# Patient Record
Sex: Male | Born: 1950
Health system: Southern US, Community
[De-identification: ages and names within clinical notes are randomized; demographics above are authoritative.]

## PROBLEM LIST (undated history)

## (undated) DIAGNOSIS — M329 Systemic lupus erythematosus, unspecified: Secondary | ICD-10-CM

## (undated) DIAGNOSIS — B351 Tinea unguium: Secondary | ICD-10-CM

## (undated) DIAGNOSIS — M7742 Metatarsalgia, left foot: Secondary | ICD-10-CM

## (undated) DIAGNOSIS — Z931 Gastrostomy status: Secondary | ICD-10-CM

## (undated) DIAGNOSIS — E785 Hyperlipidemia, unspecified: Secondary | ICD-10-CM

## (undated) DIAGNOSIS — R9431 Abnormal electrocardiogram [ECG] [EKG]: Secondary | ICD-10-CM

## (undated) DIAGNOSIS — M79606 Pain in leg, unspecified: Secondary | ICD-10-CM

## (undated) DIAGNOSIS — R7989 Other specified abnormal findings of blood chemistry: Secondary | ICD-10-CM

## (undated) DIAGNOSIS — M216X9 Other acquired deformities of unspecified foot: Secondary | ICD-10-CM

## (undated) DIAGNOSIS — I252 Old myocardial infarction: Secondary | ICD-10-CM

## (undated) DIAGNOSIS — M79605 Pain in left leg: Secondary | ICD-10-CM

## (undated) DIAGNOSIS — R319 Hematuria, unspecified: Secondary | ICD-10-CM

## (undated) DIAGNOSIS — R1312 Dysphagia, oropharyngeal phase: Secondary | ICD-10-CM

## (undated) DIAGNOSIS — I69319 Unspecified symptoms and signs involving cognitive functions following cerebral infarction: Secondary | ICD-10-CM

## (undated) DIAGNOSIS — R0789 Other chest pain: Secondary | ICD-10-CM

## (undated) DIAGNOSIS — E875 Hyperkalemia: Secondary | ICD-10-CM

## (undated) DIAGNOSIS — Z87448 Personal history of other diseases of urinary system: Secondary | ICD-10-CM

## (undated) DIAGNOSIS — M7741 Metatarsalgia, right foot: Secondary | ICD-10-CM

## (undated) DIAGNOSIS — R627 Adult failure to thrive: Secondary | ICD-10-CM

## (undated) DIAGNOSIS — F1010 Alcohol abuse, uncomplicated: Secondary | ICD-10-CM

## (undated) DIAGNOSIS — I509 Heart failure, unspecified: Secondary | ICD-10-CM

## (undated) DIAGNOSIS — I2510 Atherosclerotic heart disease of native coronary artery without angina pectoris: Secondary | ICD-10-CM

## (undated) DIAGNOSIS — IMO0002 Reserved for concepts with insufficient information to code with codable children: Secondary | ICD-10-CM

## (undated) DIAGNOSIS — M25579 Pain in unspecified ankle and joints of unspecified foot: Secondary | ICD-10-CM

## (undated) DIAGNOSIS — N390 Urinary tract infection, site not specified: Secondary | ICD-10-CM

## (undated) HISTORY — DX: Dysphagia, oropharyngeal phase: R13.12

## (undated) HISTORY — DX: Personal history of other diseases of urinary system: Z87.448

## (undated) HISTORY — DX: Pain in unspecified ankle and joints of unspecified foot: M25.579

## (undated) HISTORY — DX: Other specified abnormal findings of blood chemistry: R79.89

## (undated) HISTORY — DX: Other acquired deformities of unspecified foot: M21.6X9

## (undated) HISTORY — DX: Old myocardial infarction: I25.2

## (undated) HISTORY — DX: Metatarsalgia, right foot: M77.41

## (undated) HISTORY — DX: Adult failure to thrive: R62.7

## (undated) HISTORY — DX: Systemic lupus erythematosus, unspecified: M32.9

## (undated) HISTORY — DX: Pain in left leg: M79.605

## (undated) HISTORY — DX: Other disorders of phosphorus metabolism: E83.39

## (undated) HISTORY — DX: Alcohol abuse, uncomplicated: F10.10

## (undated) HISTORY — DX: Hyperkalemia: E87.5

## (undated) HISTORY — DX: Hyperlipidemia, unspecified: E78.5

## (undated) HISTORY — DX: Gastrostomy status: Z93.1

## (undated) HISTORY — DX: Metatarsalgia, left foot: M77.42

## (undated) HISTORY — DX: Unspecified symptoms and signs involving cognitive functions following cerebral infarction: I69.319

## (undated) HISTORY — DX: Abnormal electrocardiogram (ECG) (EKG): R94.31

## (undated) HISTORY — DX: Other chest pain: R07.89

## (undated) HISTORY — DX: Heart failure, unspecified: I50.9

## (undated) HISTORY — DX: Reserved for concepts with insufficient information to code with codable children: IMO0002

## (undated) HISTORY — DX: Hematuria, unspecified: R31.9

## (undated) HISTORY — DX: Tinea unguium: B35.1

## (undated) HISTORY — DX: Pain in leg, unspecified: M79.606

## (undated) HISTORY — DX: Urinary tract infection, site not specified: N39.0

---

## 2003-07-22 ENCOUNTER — Encounter: Admission: RE | Admit: 2003-07-22 | Discharge: 2003-07-22 | Payer: Self-pay | Admitting: Family Medicine

## 2003-07-22 ENCOUNTER — Ambulatory Visit (HOSPITAL_COMMUNITY): Admission: RE | Admit: 2003-07-22 | Discharge: 2003-07-22 | Payer: Self-pay | Admitting: Family Medicine

## 2006-12-18 DIAGNOSIS — M329 Systemic lupus erythematosus, unspecified: Secondary | ICD-10-CM

## 2006-12-18 DIAGNOSIS — I10 Essential (primary) hypertension: Secondary | ICD-10-CM

## 2006-12-18 HISTORY — DX: Systemic lupus erythematosus, unspecified: M32.9

## 2007-06-24 ENCOUNTER — Encounter: Payer: Self-pay | Admitting: Family Medicine

## 2007-06-24 ENCOUNTER — Inpatient Hospital Stay (HOSPITAL_COMMUNITY): Admission: EM | Admit: 2007-06-24 | Discharge: 2007-06-27 | Payer: Self-pay | Admitting: Emergency Medicine

## 2007-06-24 ENCOUNTER — Ambulatory Visit: Payer: Self-pay | Admitting: Cardiovascular Disease

## 2007-06-24 ENCOUNTER — Ambulatory Visit: Payer: Self-pay | Admitting: Family Medicine

## 2007-06-25 ENCOUNTER — Encounter: Payer: Self-pay | Admitting: Family Medicine

## 2007-06-26 ENCOUNTER — Ambulatory Visit: Payer: Self-pay | Admitting: Vascular Surgery

## 2007-08-06 ENCOUNTER — Encounter (INDEPENDENT_AMBULATORY_CARE_PROVIDER_SITE_OTHER): Payer: Self-pay | Admitting: *Deleted

## 2007-08-06 ENCOUNTER — Ambulatory Visit: Payer: Self-pay | Admitting: Family Medicine

## 2007-08-12 ENCOUNTER — Ambulatory Visit: Payer: Self-pay | Admitting: Cardiovascular Disease

## 2007-08-12 LAB — CONVERTED CEMR LAB
BUN: 19 mg/dL (ref 6–23)
CO2: 25 meq/L (ref 19–32)
Calcium: 9 mg/dL (ref 8.4–10.5)
Chloride: 108 meq/L (ref 96–112)
Creatinine, Ser: 0.8 mg/dL (ref 0.4–1.5)
GFR calc Af Amer: 129 mL/min
Glucose, Bld: 99 mg/dL (ref 70–99)

## 2007-09-02 ENCOUNTER — Ambulatory Visit: Payer: Self-pay | Admitting: Family Medicine

## 2007-10-22 DIAGNOSIS — I2510 Atherosclerotic heart disease of native coronary artery without angina pectoris: Secondary | ICD-10-CM

## 2007-10-22 HISTORY — DX: Atherosclerotic heart disease of native coronary artery without angina pectoris: I25.10

## 2007-11-04 ENCOUNTER — Ambulatory Visit: Payer: Self-pay | Admitting: Family Medicine

## 2007-11-04 ENCOUNTER — Ambulatory Visit: Payer: Self-pay | Admitting: Cardiovascular Disease

## 2007-11-04 ENCOUNTER — Encounter (INDEPENDENT_AMBULATORY_CARE_PROVIDER_SITE_OTHER): Payer: Self-pay | Admitting: Family Medicine

## 2007-11-11 ENCOUNTER — Encounter (INDEPENDENT_AMBULATORY_CARE_PROVIDER_SITE_OTHER): Payer: Self-pay | Admitting: Family Medicine

## 2007-11-11 LAB — CONVERTED CEMR LAB
Cholesterol: 190 mg/dL (ref 0–200)
HDL: 52 mg/dL (ref >39)
LDL Cholesterol: 120 mg/dL — ABNORMAL HIGH (ref 0–99)
Total CHOL/HDL Ratio: 3.7
Triglycerides: 88 mg/dL (ref <150)
VLDL: 18 mg/dL (ref 0–40)

## 2009-01-09 ENCOUNTER — Encounter: Payer: Self-pay | Admitting: Cardiovascular Disease

## 2009-01-09 ENCOUNTER — Ambulatory Visit: Payer: Self-pay | Admitting: Cardiovascular Disease

## 2009-01-09 DIAGNOSIS — F1010 Alcohol abuse, uncomplicated: Secondary | ICD-10-CM

## 2009-01-09 DIAGNOSIS — I420 Dilated cardiomyopathy: Secondary | ICD-10-CM | POA: Insufficient documentation

## 2009-01-09 HISTORY — DX: Alcohol abuse, uncomplicated: F10.10

## 2009-01-09 LAB — CONVERTED CEMR LAB
BUN: 15 mg/dL (ref 6–23)
Creatinine, Ser: 1.2 mg/dL (ref 0.4–1.5)
GFR calc non Af Amer: 79.93 mL/min (ref >60)
Glucose, Bld: 141 mg/dL — ABNORMAL HIGH (ref 70–99)
Potassium: 4 meq/L (ref 3.5–5.1)

## 2009-01-17 ENCOUNTER — Ambulatory Visit: Payer: Self-pay | Admitting: Family Medicine

## 2009-01-17 DIAGNOSIS — E669 Obesity, unspecified: Secondary | ICD-10-CM

## 2009-01-30 ENCOUNTER — Ambulatory Visit: Payer: Self-pay | Admitting: Family Medicine

## 2009-01-30 ENCOUNTER — Encounter: Payer: Self-pay | Admitting: Family Medicine

## 2009-01-30 LAB — CONVERTED CEMR LAB
HDL: 43 mg/dL (ref >39)
LDL Cholesterol: 162 mg/dL — ABNORMAL HIGH (ref 0–99)

## 2009-02-02 ENCOUNTER — Encounter: Payer: Self-pay | Admitting: Family Medicine

## 2009-03-02 DIAGNOSIS — I2510 Atherosclerotic heart disease of native coronary artery without angina pectoris: Secondary | ICD-10-CM

## 2009-03-02 DIAGNOSIS — E785 Hyperlipidemia, unspecified: Secondary | ICD-10-CM

## 2009-03-02 DIAGNOSIS — E1169 Type 2 diabetes mellitus with other specified complication: Secondary | ICD-10-CM | POA: Insufficient documentation

## 2009-03-16 ENCOUNTER — Ambulatory Visit: Payer: Self-pay | Admitting: Family Medicine

## 2009-04-10 ENCOUNTER — Ambulatory Visit: Payer: Self-pay | Admitting: Cardiovascular Disease

## 2009-04-17 ENCOUNTER — Telehealth: Payer: Self-pay | Admitting: *Deleted

## 2009-04-17 ENCOUNTER — Telehealth: Payer: Self-pay | Admitting: Cardiovascular Disease

## 2009-04-17 ENCOUNTER — Encounter: Payer: Self-pay | Admitting: Cardiovascular Disease

## 2009-04-17 DIAGNOSIS — H409 Unspecified glaucoma: Secondary | ICD-10-CM

## 2009-04-25 ENCOUNTER — Ambulatory Visit: Payer: Self-pay | Admitting: Family Medicine

## 2009-05-18 ENCOUNTER — Encounter: Payer: Self-pay | Admitting: Family Medicine

## 2009-06-05 ENCOUNTER — Ambulatory Visit: Payer: Self-pay | Admitting: Internal Medicine

## 2009-06-06 ENCOUNTER — Encounter: Payer: Self-pay | Admitting: *Deleted

## 2009-06-07 LAB — CONVERTED CEMR LAB
CO2: 26 meq/L (ref 19–32)
Calcium: 9.2 mg/dL (ref 8.4–10.5)
Creatinine, Ser: 1.3 mg/dL (ref 0.4–1.5)
GFR calc non Af Amer: 72.78 mL/min (ref >60)
Glucose, Bld: 200 mg/dL — ABNORMAL HIGH (ref 70–99)
Potassium: 4.9 meq/L (ref 3.5–5.1)
Sodium: 136 meq/L (ref 135–145)

## 2009-06-29 ENCOUNTER — Ambulatory Visit: Payer: Self-pay | Admitting: Family Medicine

## 2009-06-29 DIAGNOSIS — R1314 Dysphagia, pharyngoesophageal phase: Secondary | ICD-10-CM

## 2009-06-29 DIAGNOSIS — E1142 Type 2 diabetes mellitus with diabetic polyneuropathy: Secondary | ICD-10-CM

## 2009-06-29 LAB — CONVERTED CEMR LAB: Hgb A1c MFr Bld: 8.7 %

## 2009-07-11 ENCOUNTER — Ambulatory Visit: Payer: Self-pay | Admitting: Cardiovascular Disease

## 2009-07-18 ENCOUNTER — Encounter: Payer: Self-pay | Admitting: Family Medicine

## 2009-07-18 ENCOUNTER — Encounter: Admission: RE | Admit: 2009-07-18 | Discharge: 2009-07-18 | Payer: Self-pay | Admitting: Family Medicine

## 2009-07-26 ENCOUNTER — Encounter: Payer: Self-pay | Admitting: Family Medicine

## 2009-07-28 ENCOUNTER — Ambulatory Visit: Payer: Self-pay | Admitting: Family Medicine

## 2009-08-11 ENCOUNTER — Encounter: Payer: Self-pay | Admitting: Family Medicine

## 2009-08-15 ENCOUNTER — Ambulatory Visit: Payer: Self-pay | Admitting: Family Medicine

## 2009-08-28 ENCOUNTER — Ambulatory Visit: Payer: Self-pay | Admitting: Family Medicine

## 2009-09-19 ENCOUNTER — Ambulatory Visit: Payer: Self-pay | Admitting: Family Medicine

## 2009-10-19 ENCOUNTER — Encounter: Payer: Self-pay | Admitting: Family Medicine

## 2009-10-19 ENCOUNTER — Encounter: Admission: RE | Admit: 2009-10-19 | Discharge: 2010-01-17 | Payer: Self-pay | Admitting: Family Medicine

## 2009-11-09 ENCOUNTER — Telehealth: Payer: Self-pay | Admitting: *Deleted

## 2009-11-13 ENCOUNTER — Encounter (INDEPENDENT_AMBULATORY_CARE_PROVIDER_SITE_OTHER): Payer: Self-pay | Admitting: *Deleted

## 2009-11-15 ENCOUNTER — Encounter (INDEPENDENT_AMBULATORY_CARE_PROVIDER_SITE_OTHER): Payer: Self-pay | Admitting: *Deleted

## 2009-12-27 ENCOUNTER — Ambulatory Visit: Payer: Self-pay | Admitting: Cardiovascular Disease

## 2009-12-27 ENCOUNTER — Ambulatory Visit: Payer: Self-pay

## 2009-12-27 ENCOUNTER — Encounter: Payer: Self-pay | Admitting: Cardiovascular Disease

## 2009-12-27 ENCOUNTER — Ambulatory Visit (HOSPITAL_COMMUNITY): Admission: RE | Admit: 2009-12-27 | Discharge: 2009-12-27 | Payer: Self-pay | Admitting: Cardiovascular Disease

## 2009-12-29 ENCOUNTER — Ambulatory Visit: Payer: Self-pay | Admitting: Family Medicine

## 2009-12-29 LAB — CONVERTED CEMR LAB: Hgb A1c MFr Bld: 6.6 %

## 2010-01-01 ENCOUNTER — Encounter: Payer: Self-pay | Admitting: Family Medicine

## 2010-01-30 ENCOUNTER — Encounter: Payer: Self-pay | Admitting: Family Medicine

## 2010-01-31 ENCOUNTER — Encounter: Admission: RE | Admit: 2010-01-31 | Discharge: 2010-01-31 | Payer: Self-pay | Admitting: Family Medicine

## 2010-02-12 ENCOUNTER — Ambulatory Visit: Payer: Self-pay | Admitting: Family Medicine

## 2010-02-13 ENCOUNTER — Encounter: Payer: Self-pay | Admitting: Family Medicine

## 2010-02-13 ENCOUNTER — Ambulatory Visit: Payer: Self-pay | Admitting: Family Medicine

## 2010-02-13 LAB — CONVERTED CEMR LAB
AST: 14 units/L (ref 0–37)
Albumin: 3.9 g/dL (ref 3.5–5.2)
Alkaline Phosphatase: 86 units/L (ref 39–117)
Calcium: 8.8 mg/dL (ref 8.4–10.5)
Chloride: 106 meq/L (ref 96–112)
Creatinine, Ser: 1.19 mg/dL (ref 0.40–1.50)
HDL: 41 mg/dL (ref >39)
Sodium: 138 meq/L (ref 135–145)
Total Protein: 6.7 g/dL (ref 6.0–8.3)
Triglycerides: 107 mg/dL (ref <150)

## 2010-02-15 ENCOUNTER — Encounter: Payer: Self-pay | Admitting: Family Medicine

## 2010-05-03 ENCOUNTER — Encounter: Admission: RE | Admit: 2010-05-03 | Discharge: 2010-05-03 | Payer: Self-pay | Admitting: Family Medicine

## 2010-05-10 ENCOUNTER — Telehealth: Payer: Self-pay | Admitting: Family Medicine

## 2010-06-26 ENCOUNTER — Ambulatory Visit: Payer: Self-pay | Admitting: Cardiovascular Disease

## 2010-06-26 DIAGNOSIS — M109 Gout, unspecified: Secondary | ICD-10-CM

## 2010-06-28 LAB — CONVERTED CEMR LAB
Bilirubin, Direct: 0.2 mg/dL (ref 0.0–0.3)
Calcium: 8.8 mg/dL (ref 8.4–10.5)
Creatinine, Ser: 0.8 mg/dL (ref 0.4–1.5)
GFR calc non Af Amer: 123.41 mL/min (ref >60)
Total Bilirubin: 0.6 mg/dL (ref 0.3–1.2)
Total Protein: 6.5 g/dL (ref 6.0–8.3)

## 2010-07-18 ENCOUNTER — Encounter: Payer: Self-pay | Admitting: Family Medicine

## 2010-07-18 ENCOUNTER — Ambulatory Visit: Payer: Self-pay | Admitting: Family Medicine

## 2010-08-24 ENCOUNTER — Encounter: Payer: Self-pay | Admitting: Family Medicine

## 2010-09-28 ENCOUNTER — Encounter: Payer: Self-pay | Admitting: Family Medicine

## 2010-11-01 ENCOUNTER — Ambulatory Visit: Admission: RE | Admit: 2010-11-01 | Discharge: 2010-11-01 | Payer: Self-pay | Source: Home / Self Care

## 2010-11-01 DIAGNOSIS — R439 Unspecified disturbances of smell and taste: Secondary | ICD-10-CM | POA: Insufficient documentation

## 2010-11-01 LAB — CONVERTED CEMR LAB: Hgb A1c MFr Bld: 6.3 %

## 2010-11-07 ENCOUNTER — Encounter
Admission: RE | Admit: 2010-11-07 | Discharge: 2010-11-20 | Payer: Self-pay | Source: Home / Self Care | Attending: Family Medicine | Admitting: Family Medicine

## 2010-11-15 ENCOUNTER — Encounter: Payer: Self-pay | Admitting: Family Medicine

## 2010-11-15 DIAGNOSIS — K921 Melena: Secondary | ICD-10-CM | POA: Insufficient documentation

## 2010-11-22 NOTE — Assessment & Plan Note (Signed)
Summary: F6M/DM   Primary Lamika Connolly:  Angelena Sole MD  CC:  check up.  History of Present Illness: Blake Wilson is seen today for F/U of nonischemic DCM with HTN and elevated lipids.  He is doing well with no SOB, palpitations, edema or SSCP.  His last echo in March which I reviewed showed  mild LVE with mild diffuse hypokinesis EF 50-55% and mild MR.  He has been compliant with his meds which has been an issue in the past.  He is not working but says he goes to a gym 2-3 times / week and "walks around" He has had recurrent bouts of gout in his feet.    Current Problems (verified): 1)  Knee Pain, Right  (ICD-719.46) 2)  Dysphagia Unspecified  (ICD-787.20) 3)  Headache  (ICD-784.0) 4)  Diabetes Mellitus  (ICD-250.00) 5)  Encounter For Long-term Use of Other Medications  (ICD-V58.69) 6)  Glaucoma  (ICD-365.9) 7)  Cardiomyopathy  (ICD-425.4) 8)  Hypertension, Benign Systemic  (ICD-401.1) 9)  Cad  (ICD-414.00) 10)  Hyperlipidemia  (ICD-272.4) 11)  Obesity  (ICD-278.00) 12)  Alcohol Abuse  (ICD-305.00) 13)  Leg Edema, Bilateral  (ICD-782.3) 14)  Sle  (ICD-710.0) 15)  Osteoarthritis, Knee, Left  (ICD-715.96) 16)  Osteoarthritis, Back  (ICD-715.98)  Current Medications (verified): 1)  Aspir-Low 81 Mg Tbec (Aspirin) .Marland Kitchen.. 1 Once Daily After Meal 2)  Klor-Con 10 10 Meq Cr-Tabs (Potassium Chloride) .Marland Kitchen.. 1 Tab By Mouth Once Daily 3)  Travatan Z 0.004 % Soln (Travoprost) .Marland Kitchen.. 1 Drop in Both Eyes Once Daily 4)  Carvedilol 25 Mg Tabs (Carvedilol) .... Take One Tablet By Mouth Twice A Day 5)  Lasix 40 Mg Tabs (Furosemide) .... Take 1 Tab By Mouth Twice Daily 6)  Blood Glucose Meter  Kit (Blood Glucose Monitoring Suppl) .... Use As Directed 7)  Lancets  Misc (Lancets) .... Use 1 A Day To Check Your Blood Sugar 8)  Easy Check Glucose Test  Strp (Glucose Blood) .... Use 1 A Day To Check Your Blood Sugar 9)  Ultram 50 Mg Tabs (Tramadol Hcl) .... Take 1 Tab Every 8 Hours As Needed For Pain 10)  Lipitor  80 Mg Tabs (Atorvastatin Calcium) .... One Daily At Bedtime 11)  Metformin Hcl 1000 Mg Tabs (Metformin Hcl) .... Take One Twice Daily With Meals 12)  Lisinopril 40 Mg Tabs (Lisinopril) .... Take One Tablet By Mouth Daily  Allergies (verified): No Known Drug Allergies  Past History:  Past Medical History: Last updated: 03/02/2009 Current Problems:  CARDIOMYOPATHY (ICD-425.4) HYPERTENSION, BENIGN SYSTEMIC (ICD-401.1) CAD (ICD-414.00) HYPERLIPIDEMIA (ICD-272.4) OBESITY (ICD-278.00) ALCOHOL ABUSE (ICD-305.00) SPECIAL SCREENING MALIGNANT NEOPLASM OF PROSTATE (ICD-V76.44) LEG EDEMA, BILATERAL (ICD-782.3) SLE (ICD-710.0) OSTEOARTHRITIS, KNEE, LEFT (ICD-715.96) OSTEOARTHRITIS, BACK (ICD-715.98) lupus  SLE on chronic steriods until 1999.  States had rash only EF of 20  %,LVH  non ischemic cardiomyopathy, sees Dr. Eden Emms at [facility]  lupus erythematosus  Past Surgical History: Last updated: 03/16/2009 cath 05/08 - 40% stenosis in multiple vessels, non-ischemic cardiomyopathy  Family History: Last updated: 07/08/2009  Father:-obese. had an MI age 84. No other heart  disease in the family.   Social History: Last updated: 03/02/2009 Single, no children, lives alone. Denies smoking.  One beer or shot a day at most.   No drugs.  Lives in [place]  Review of Systems       Denies fever, malais, weight loss, blurry vision, decreased visual acuity, cough, sputum, SOB, hemoptysis, pleuritic pain, palpitaitons, heartburn, abdominal pain, melena, lower extremity edema,  claudication, or rash.   Vital Signs:  Patient profile:   60 year old male Height:      72 inches Weight:      232 pounds BMI:     31.58 Pulse rate:   57 / minute Resp:     14 per minute BP sitting:   114 / 64  (left arm)  Vitals Entered By: Kem Parkinson (June 26, 2010 11:08 AM)  Physical Exam  General:  Affect appropriate Healthy:  appears stated age HEENT: normal Neck supple with no  adenopathy JVP normal no bruits no thyromegaly Lungs clear with no wheezing and good diaphragmatic motion Heart:  S1/S2 no murmur,rub, gallop or click PMI normal Abdomen: benighn, BS positve, no tenderness, no AAA no bruit.  No HSM or HJR Distal pulses intact with no bruits No edema Neuro non-focal Skin warm and dry    Impression & Recommendations:  Problem # 1:  CARDIOMYOPATHY (ICD-425.4) Stable functional class one.  Consdier F/U echo or MRI in a year His updated medication list for this problem includes:    Aspir-low 81 Mg Tbec (Aspirin) .Marland Kitchen... 1 once daily after meal    Carvedilol 25 Mg Tabs (Carvedilol) .Marland Kitchen... Take one tablet by mouth twice a day    Lasix 40 Mg Tabs (Furosemide) .Marland Kitchen... Take 1 tab by mouth twice daily    Lisinopril 40 Mg Tabs (Lisinopril) .Marland Kitchen... Take one tablet by mouth daily  Problem # 2:  HYPERTENSION, BENIGN SYSTEMIC (ICD-401.1)  Well controlled continue current meds His updated medication list for this problem includes:    Aspir-low 81 Mg Tbec (Aspirin) .Marland Kitchen... 1 once daily after meal    Carvedilol 25 Mg Tabs (Carvedilol) .Marland Kitchen... Take one tablet by mouth twice a day    Lasix 40 Mg Tabs (Furosemide) .Marland Kitchen... Take 1 tab by mouth twice daily    Lisinopril 40 Mg Tabs (Lisinopril) .Marland Kitchen... Take one tablet by mouth daily  His updated medication list for this problem includes:    Aspir-low 81 Mg Tbec (Aspirin) .Marland Kitchen... 1 once daily after meal    Carvedilol 25 Mg Tabs (Carvedilol) .Marland Kitchen... Take one tablet by mouth twice a day    Lasix 40 Mg Tabs (Furosemide) .Marland Kitchen... Take 1 tab by mouth twice daily    Lisinopril 40 Mg Tabs (Lisinopril) .Marland Kitchen... Take one tablet by mouth daily  Problem # 3:  HYPERLIPIDEMIA (ICD-272.4) Stable labs per primary.  No myalgias His updated medication list for this problem includes:    Lipitor 80 Mg Tabs (Atorvastatin calcium) ..... One daily at bedtime  Problem # 4:  GOUT, UNSPECIFIED (ICD-274.9)  Needs diuretic for DCM.  Check uric acid level and start  Allopurinol 100mg /day.  Uptitrate per primary MD.    His updated medication list for this problem includes:    Allopurinol 100 Mg Tabs (Allopurinol) .Marland Kitchen... Please take 1 tablet by mouth daily.  Other Orders: TLB-BMP (Basic Metabolic Panel-BMET) (80048-METABOL) TLB-Hepatic/Liver Function Pnl (80076-HEPATIC) T-Uric Acid (Blood) [phone])  Patient Instructions: 1)  Your physician recommends that you schedule a follow-up appointment in: 6 months 2)  Your physician has recommended you make the following change in your medication:  3)  START ALLOPURINOL 100 MG by mouth DAILY. Prescriptions: ALLOPURINOL 100 MG TABS (ALLOPURINOL) Please take 1 tablet by mouth daily.  #30 x 6   Entered by:   Whitney Maeola Sarah RN   Authorized by:   Colon Branch, MD, [facility]   Signed by:   Ellender Hose RN on 06/26/2010  Method used:   Electronically to        [facility] (retail)       [address]       [place], [place]  [address]       Ph: [phone] or [phone]       Fax: [phone]   RxID:   [id number]   Prevention & Chronic Care Immunizations   Influenza vaccine: Fluvax Non-MCR  (08/15/2009)   Influenza vaccine due: 09/01/2008    Tetanus booster: 01/17/2009: Tdap    Pneumococcal vaccine: Not documented  Colorectal Screening   Hemoccult: Not documented    Colonoscopy: Not documented   Colonoscopy action/deferral: GI Referral  (12/29/2009)  Other Screening   PSA: 0.41  (11/04/2007)   PSA due due: 11/03/2008   Smoking status: quit > 6 months  (12/29/2009)  Diabetes Mellitus   HgbA1C: 6.6  (12/29/2009)    Eye exam: Vision 20/20 B No diabetic retinopathy  (07/20/2009)    Foot exam: Not documented   Foot exam action/deferral: Do today   High risk foot: Not documented   Foot care education: Not documented    Urine microalbumin/creatinine ratio: Not documented  Lipids   Total Cholesterol: 140  (02/13/2010)    LDL: 78  (02/13/2010)   LDL Direct: Not documented   HDL: 41  (02/13/2010)   Triglycerides: 107  (02/13/2010)    SGOT (AST): 14  (02/13/2010)   BMP action: Ordered   SGPT (ALT): 21  (02/13/2010)   Alkaline phosphatase: 86  (02/13/2010)   Total bilirubin: 0.5  (02/13/2010)  Hypertension   Last Blood Pressure: 114 / 64  (06/26/2010)   Serum creatinine: 1.19  (02/13/2010)   Serum potassium 4.8  (02/13/2010)  Self-Management Support :   Personal Goals (by the next clinic visit) :     Personal A1C goal: 8  (09/19/2009)     Personal blood pressure goal: 130/80  (07/28/2009)     Personal LDL goal: 100  (07/28/2009)    Diabetes self-management support: CBG self-monitoring log, Written self-care plan, Education handout, Pre-printed educational material  (08/15/2009)    Hypertension self-management support: Not documented    Hypertension self-management support not done because: Good outcomes  (08/15/2009)    Lipid self-management support: Not documented

## 2010-11-22 NOTE — Consult Note (Signed)
Summary: Nutrition & DM  Nutrition & DM   Imported By: De Nurse 10/31/2009 10:53:26  _____________________________________________________________________  External Attachment:    Type:   Image     Comment:   External Document

## 2010-11-22 NOTE — Letter (Signed)
Summary: Generic Letter  [facility]  [address]   [place], [place] [address]   Phone: [phone]  Fax: [phone]    02/15/2010  Blake Wilson [address] [place], [place]  [address]  Dear Mr. Tennell,  Here is a copy of your lab results.  You cholesterol numbers look great so we will not make any medication changes.  Your kidney and liver function also look good.   Tests: (1) Basic Metabolic Panel ([address]846)   Order Note: FASTING   Sodium                    138 mEq/L                   135-145   Potassium                 4.8 mEq/L                   3.5-5.3   Chloride                  106 mEq/L                   96-112   CO2                       23 mEq/L                    19-32   Glucose              [H]  104 mg/dL                   96-29   BUN                       23 mg/dL                    5-28   Creatinine                1.19 mg/dL                  0.40-1.50   Calcium                   8.8 mg/dL                   4.1-32.4  Tests: (2) Lipid Profile (40102)   Cholesterol               140 mg/dL                   7-253     ATP III Classification:           < 200        mg/dL        Desirable          200 - 239     mg/dL        Borderline High          >= 240        mg/dL        High         Triglyceride              107 mg/dL                   <  150   HDL Cholesterol           41 mg/dL                    >40   Total Chol/HDL Ratio      3.4 Ratio  VLDL Cholesterol (Calc)                             21 mg/dL                    9-81  LDL Cholesterol (Calc)                             78 mg/dL                    1-91           Total Cholesterol/HDL Ratio:CHD Risk                            Coronary Heart Disease Risk Table                                            Men       Women              1/2 Average Risk              3.4        3.3                  Average Risk              5.0        4.4              2 X Average Risk               9.6        7.1              3 X Average Risk             23.4       11.0     Use the calculated Patient Ratio above and the CHD Risk table      to determine the patient's CHD Risk.     ATP III Classification (LDL):           < 100        mg/dL         Optimal          100 - 129     mg/dL         Near or Above Optimal          130 - 159     mg/dL         Borderline High          160 - 189     mg/dL         High           > 190        mg/dL         Very High        Tests: (3) Liver Profile (47829)   Bilirubin,  Total          0.5 mg/dL                   1.6-1.0   Bilirubin, Direct         0.1 mg/dL                   9.6-0.4   Indirect Bilirubin        0.4 mg/dL                   5.4-0.9   Alkaline Phosphatase      86 U/L                      39-117   AST/SGOT                  14 U/L                      0-37   ALT/SGPT                  21 U/L                      0-53   Total Protein             6.7 g/dL                    8.1-1.9   Albumin                   3.9 g/dL                    1.4-7.8        Sincerely,   Angelena Sole MD  Appended Document: Generic Letter mailed.

## 2010-11-22 NOTE — Miscellaneous (Signed)
  Clinical Lists Changes  Problems: Removed problem of GOUT, UNSPECIFIED (ICD-274.9) Removed problem of KNEE PAIN, RIGHT (ICD-719.46) Removed problem of HEADACHE (ICD-784.0) Removed problem of ENCOUNTER FOR LONG-TERM USE OF OTHER MEDICATIONS (ICD-V58.69) Removed problem of LEG EDEMA, BILATERAL (ICD-782.3) Removed problem of OSTEOARTHRITIS, KNEE, LEFT (ICD-715.96) Removed problem of OSTEOARTHRITIS, BACK (ICD-715.98)

## 2010-11-22 NOTE — Miscellaneous (Signed)
  Clinical Lists Changes pt receives meds through the [facility] and can get accupril free. lisinopril changed to accupril. Deliah Goody, RN  November 13, 2009 8:40 AM  Medications: Changed medication from LISINOPRIL 40 MG TABS (LISINOPRIL) 1 by mouth once daily to ACCUPRIL 40 MG TABS (QUINAPRIL HCL) on e tablet by mouth once daily

## 2010-11-22 NOTE — Progress Notes (Signed)
Summary: Dental Clinic  Phone Note From Other Clinic   Caller: Angie Call For: Adult Dental Summary of Call: Pt is Diabetic and needs all his top teeth extracted, appt set for next month.  Needs to know if MD feels pts Diabetes are under control enough for this.  Also he is on asprin and wants to know if it is ok for him to discontinue this.  Will forward message to MD Initial call taken by: Jone Baseman CMA,  November 09, 2009 11:55 AM  Follow-up for Phone Call        Please inform pt that I feel that his diabetes is under good enough control to have the dental procedure performed.  He should stop taking the Aspirin for 3 days prior to the procedure and then restart taking it 2 days afterward. Follow-up by: Angelena Sole MD,  November 10, 2009 10:07 AM  Additional Follow-up for Phone Call Additional follow up Details #1::        Informed Angie of the above.  She ask that we fax over this message so she can have documentation.  Will fax to [phone] Additional Follow-up by: Jone Baseman CMA,  November 10, 2009 10:19 AM

## 2010-11-22 NOTE — Progress Notes (Signed)
Summary: Rx Req  Phone Note Refill Request Call back at Work Phone [phone] Message from:  Patient  Refills Requested: Medication #1:  LASIX 40 MG TABS take 1 tab by mouth twice daily [facility] DEPT.  Initial call taken by: Clydell Hakim,  May 10, 2010 9:20 AM    Prescriptions: LASIX 40 MG TABS (FUROSEMIDE) take 1 tab by mouth twice daily  #60 x 12   Entered and Authorized by:   Angelena Sole MD   Signed by:   Angelena Sole MD on 05/10/2010   Method used:   Faxed to ...       [facility] (retail)       [address] [place], [place]  [address]       Ph: [phone]       Fax: [phone]   RxID:   [id number]

## 2010-11-22 NOTE — Miscellaneous (Addendum)
  Clinical Lists Changes Results from Colon Cancer screening study 1 Hemoccult Card positive 2 Hemoccult Cards negative  Will refer to GI for colonoscopy Problems: Added new problem of HEMOCCULT POSITIVE STOOL (ICD-578.1) - Signed Orders: Added new Referral order of Gastroenterology Referral (GI) - Signed  Appended Document: hemoccult cards [facility] Study)    Lab Visit  Laboratory Results  Date/Time Received: November 15, 2010 Date/Time Reported: November 22, 2010 5:22 PM   Stool - Occult Blood Hemmoccult #1: negative Date: 11/03/2010 Hemoccult #2: negative Date: 11/04/2010 Hemoccult #3: positive Date: 12/06/2010 Comments: [facility] Study, cards developed at [facility] ...............test performed by......Marland KitchenBonnie A. [place], MLS (ASCP)cm   Orders Today:  Pt referred to GI Angelena Sole MD  November 23, 2010 2:00 PM

## 2010-11-22 NOTE — Assessment & Plan Note (Signed)
Summary: DM CHECK   Vital Signs:  Patient profile:   60 year old male Height:      72 inches Weight:      240.13 pounds BMI:     32.69 Temp:     97.9 degrees F oral Pulse rate:   59 / minute BP sitting:   140 / 64  (right arm)  Vitals Entered By: Terese Door (December 29, 2009 9:30 AM) CC: DM, HTN, HLD Is Patient Diabetic? Yes Pain Assessment Patient in pain? no        Primary Care Provider:  Angelena Sole MD  CC:  DM, HTN, and HLD.  History of Present Illness: 1. DM:  Pt is taking his Metformin as prescribed.  He is trying to watch his diet.  He checks his blood sugars at home regularly, range between 70-220.        ROS: denies any numbness / weakness, vision problems, skin ulcers  2. HTN: Pt is taking his medicines as prescribed.  He does not check his blood pressure at home regularly.      ROS: denies chest pain, shortness of breath, headache  3. HLD: Pt is taking his Lipitor as prescribed.  His cholesterol was last checked in 01/2009.      ROS: denies any claudication  4. Non-ischemic cardiomyopathy: recent appointment with Dr. Eden Emms shows that his heart is getting a little bit better.  EF  ~50%.        ROS: denies lower extremity swelling.  Habits & Providers  Alcohol-Tobacco-Diet     Tobacco Status: quit > 6 months  Current Medications (verified): 1)  Aspir-Low 81 Mg Tbec (Aspirin) .Marland Kitchen.. 1 Once Daily After Meal 2)  Klor-Con 10 10 Meq Cr-Tabs (Potassium Chloride) .Marland Kitchen.. 1 Tab By Mouth Once Daily 3)  Travatan Z 0.004 % Soln (Travoprost) .Marland Kitchen.. 1 Drop in Both Eyes Once Daily 4)  Carvedilol 25 Mg Tabs (Carvedilol) .... Take One Tablet By Mouth Twice A Day 5)  Lasix 40 Mg Tabs (Furosemide) .... Take 1 Tab By Mouth Twice Daily 6)  Blood Glucose Meter  Kit (Blood Glucose Monitoring Suppl) .... Use As Directed 7)  Lancets  Misc (Lancets) .... Use 1 A Day To Check Your Blood Sugar 8)  Easy Check Glucose Test  Strp (Glucose Blood) .... Use 1 A Day To Check Your Blood  Sugar 9)  Ultram 50 Mg Tabs (Tramadol Hcl) .... Take 1 Tab Every 8 Hours As Needed For Pain 10)  Lipitor 80 Mg Tabs (Atorvastatin Calcium) .... One Daily At Bedtime 11)  Metformin Hcl 1000 Mg Tabs (Metformin Hcl) .... Take One Twice Daily With Meals 12)  Lisinopril 40 Mg Tabs (Lisinopril) .... Take One Tablet By Mouth Daily  Allergies: No Known Drug Allergies  Past History:  Past Medical History: Reviewed history from 03/02/2009 and no changes required. Current Problems:  CARDIOMYOPATHY (ICD-425.4) HYPERTENSION, BENIGN SYSTEMIC (ICD-401.1) CAD (ICD-414.00) HYPERLIPIDEMIA (ICD-272.4) OBESITY (ICD-278.00) ALCOHOL ABUSE (ICD-305.00) SPECIAL SCREENING MALIGNANT NEOPLASM OF PROSTATE (ICD-V76.44) LEG EDEMA, BILATERAL (ICD-782.3) SLE (ICD-710.0) OSTEOARTHRITIS, KNEE, LEFT (ICD-715.96) OSTEOARTHRITIS, BACK (ICD-715.98) lupus  SLE on chronic steriods until 1999.  States had rash only EF of 20  %,LVH  non ischemic cardiomyopathy, sees Dr. Eden Emms at [facility]  lupus erythematosus  Social History: Reviewed history from 03/02/2009 and no changes required. Single, no children, lives alone. Denies smoking.  One beer or shot a day at most.   No drugs.  Lives in [place] Status:  quit > 6 months  Physical Exam  General:  Vitals reviewed.  alert, well-hydrated, and overweight-appearing.   Head:  normocephalic and atraumatic.   Eyes:  vision grossly intact, pupils equal, pupils round, and pupils reactive to light.   Mouth:  fair dentition.   Neck:  no JVD Lungs:  normal respiratory effort, no intercostal retractions, and no wheezes.   Heart:  normal rate and regular rhythm.   Abdomen:  soft and non-tender.   Extremities:  no lower extremity edema Skin:  Intact without suspicious lesions or rashes Psych:  alert and cooperative, not depressed appearing   Impression & Recommendations:  Problem # 1:  DIABETES MELLITUS (ICD-250.00) Assessment Unchanged At goal. No  changes His updated medication list for this problem includes:    Aspir-low 81 Mg Tbec (Aspirin) .Marland Kitchen... 1 once daily after meal    Metformin Hcl 1000 Mg Tabs (Metformin hcl) .Marland Kitchen... Take one twice daily with meals    Lisinopril 40 Mg Tabs (Lisinopril) .Marland Kitchen... Take one tablet by mouth daily  Orders: A1C-[facility] (04540) [facility]- Est  Level 4 (98119)  Problem # 2:  HYPERTENSION, BENIGN SYSTEMIC (ICD-401.1) Assessment: Unchanged  Slightly elevated.  No need for medication changes at this point. His updated medication list for this problem includes:    Carvedilol 25 Mg Tabs (Carvedilol) .Marland Kitchen... Take one tablet by mouth twice a day    Lasix 40 Mg Tabs (Furosemide) .Marland Kitchen... Take 1 tab by mouth twice daily    Lisinopril 40 Mg Tabs (Lisinopril) .Marland Kitchen... Take one tablet by mouth daily  Orders: [facility]- Est  Level 4 (14782)  Problem # 3:  HYPERLIPIDEMIA (ICD-272.4) Assessment: Unchanged  Will recheck in 2 months. His updated medication list for this problem includes:    Lipitor 80 Mg Tabs (Atorvastatin calcium) ..... One daily at bedtime  Orders: [facility]- Est  Level 4 (95621)  Problem # 4:  CARDIOMYOPATHY (ICD-425.4) Assessment: Unchanged  Doing well, followed by Dr. Eden Emms.  Orders: [facility]- Est  Level 4 (99214)  Complete Medication List: 1)  Aspir-low 81 Mg Tbec (Aspirin) .Marland Kitchen.. 1 once daily after meal 2)  Klor-con 10 10 Meq Cr-tabs (Potassium chloride) .Marland Kitchen.. 1 tab by mouth once daily 3)  Travatan Z 0.004 % Soln (Travoprost) .Marland Kitchen.. 1 drop in both eyes once daily 4)  Carvedilol 25 Mg Tabs (Carvedilol) .... Take one tablet by mouth twice a day 5)  Lasix 40 Mg Tabs (Furosemide) .... Take 1 tab by mouth twice daily 6)  Blood Glucose Meter Kit (Blood glucose monitoring suppl) .... Use as directed 7)  Lancets Misc (Lancets) .... Use 1 a day to check your blood sugar 8)  Easy Check Glucose Test Strp (Glucose blood) .... Use 1 a day to check your blood sugar 9)  Ultram 50 Mg Tabs (Tramadol hcl) .... Take 1 tab every 8  hours as needed for pain 10)  Lipitor 80 Mg Tabs (Atorvastatin calcium) .... One daily at bedtime 11)  Metformin Hcl 1000 Mg Tabs (Metformin hcl) .... Take one twice daily with meals 12)  Lisinopril 40 Mg Tabs (Lisinopril) .... Take one tablet by mouth daily  Other Orders: Colonoscopy (Colon)  Patient Instructions: 1)  I think that you are doing great 2)  Your HgA1C sugar test was 6.6%, which is a good number 3)  Your blood pressure is fine 4)  I have put in a referral for a colonoscopy.  I think that [facility] Eagles should help cover it.   5)  I would like to see you back in 8  weeks to check your cholesterol.  Do not eat for 8 hours prior to your appointment.  Laboratory Results   Blood Tests   Date/Time Received: December 29, 2009 9:31 AM  Date/Time Reported: December 29, 2009 9:32 AM   HGBA1C: 6.6%   (Normal Range: Non-Diabetic - 3-6%   Control Diabetic - 6-8%)  Comments: ...............test performed by......Marland KitchenBonnie A. [place], MLS (ASCP)cm      Prevention & Chronic Care Immunizations   Influenza vaccine: Fluvax Non-MCR  (08/15/2009)   Influenza vaccine due: 09/01/2008    Tetanus booster: 01/17/2009: Tdap    Pneumococcal vaccine: Not documented  Colorectal Screening   Hemoccult: Not documented    Colonoscopy: Not documented   Colonoscopy action/deferral: GI Referral  (12/29/2009)  Other Screening   PSA: 0.41  (11/04/2007)   PSA due due: 11/03/2008   Smoking status: quit > 6 months  (12/29/2009)  Diabetes Mellitus   HgbA1C: 6.6  (12/29/2009)    Eye exam: Vision 20/20 B No diabetic retinopathy  (07/20/2009)    Foot exam: Not documented   Foot exam action/deferral: Do today   High risk foot: Not documented   Foot care education: Not documented    Urine microalbumin/creatinine ratio: Not documented    Diabetes flowsheet reviewed?: Yes   Progress toward A1C goal: Unchanged  Lipids   Total Cholesterol: 225  (01/30/2009)   LDL: 162  (01/30/2009)   LDL Direct:  Not documented   HDL: 43  (01/30/2009)   Triglycerides: 99  (01/30/2009)    SGOT (AST): Not documented   SGPT (ALT): Not documented   Alkaline phosphatase: Not documented   Total bilirubin: Not documented    Lipid flowsheet reviewed?: Yes   Progress toward LDL goal: Unchanged  Hypertension   Last Blood Pressure: 140 / 64  (12/29/2009)   Serum creatinine: 1.3  (06/05/2009)   Serum potassium 4.9  (06/05/2009)    Hypertension flowsheet reviewed?: Yes   Progress toward BP goal: Unchanged  Self-Management Support :   Personal Goals (by the next clinic visit) :     Personal A1C goal: 8  (09/19/2009)     Personal blood pressure goal: 130/80  (07/28/2009)     Personal LDL goal: 100  (07/28/2009)    Diabetes self-management support: CBG self-monitoring log, Written self-care plan, Education handout, Pre-printed educational material  (08/15/2009)    Hypertension self-management support: Not documented    Hypertension self-management support not done because: Good outcomes  (08/15/2009)    Lipid self-management support: Not documented    Nursing Instructions: Screening colonoscopy ordered

## 2010-11-22 NOTE — Assessment & Plan Note (Signed)
Summary: F6M/DM   Primary Provider:  Angelena Sole MD  CC:  no complaints.  History of Present Illness: [place]aus is seen today for F/U of nonischemic DCM with HTN and elevated lipids.  He is doing well with no SOB, palpitations, edema or SSCP.  He had an echo today which I reviewed.  He has mild LVE with mild diffuse hypokinesis EF 50-55% and mild MR.  He has been compliant with his meds which has been an issue in the past.  He is not working but says he goes to a gym 2-3 times / week and "walks around"  Current Problems (verified): 1)  Knee Pain, Right  (ICD-719.46) 2)  Dysphagia Unspecified  (ICD-787.20) 3)  Headache  (ICD-784.0) 4)  Diabetes Mellitus  (ICD-250.00) 5)  Encounter For Long-term Use of Other Medications  (ICD-V58.69) 6)  Glaucoma  (ICD-365.9) 7)  Cardiomyopathy  (ICD-425.4) 8)  Hypertension, Benign Systemic  (ICD-401.1) 9)  Cad  (ICD-414.00) 10)  Hyperlipidemia  (ICD-272.4) 11)  Obesity  (ICD-278.00) 12)  Alcohol Abuse  (ICD-305.00) 13)  Leg Edema, Bilateral  (ICD-782.3) 14)  Sle  (ICD-710.0) 15)  Osteoarthritis, Knee, Left  (ICD-715.96) 16)  Osteoarthritis, Back  (ICD-715.98)  Current Medications (verified): 1)  Aspir-Low 81 Mg Tbec (Aspirin) .Marland Kitchen.. 1 Once Daily After Meal 2)  Klor-Con 10 10 Meq Cr-Tabs (Potassium Chloride) .Marland Kitchen.. 1 Tab By Mouth Once Daily 3)  Travatan Z 0.004 % Soln (Travoprost) .Marland Kitchen.. 1 Drop in Both Eyes Once Daily 4)  Carvedilol 25 Mg Tabs (Carvedilol) .... Take One Tablet By Mouth Twice A Day 5)  Lasix 40 Mg Tabs (Furosemide) .... Take 1 Tab By Mouth Twice Daily 6)  Blood Glucose Meter  Kit (Blood Glucose Monitoring Suppl) .... Use As Directed 7)  Lancets  Misc (Lancets) .... Use 1 A Day To Check Your Blood Sugar 8)  Easy Check Glucose Test  Strp (Glucose Blood) .... Use 1 A Day To Check Your Blood Sugar 9)  Ultram 50 Mg Tabs (Tramadol Hcl) .... Take 1 Tab Every 8 Hours As Needed For Pain 10)  Lipitor 80 Mg Tabs (Atorvastatin Calcium) .... One  Daily At Bedtime 11)  Metformin Hcl 1000 Mg Tabs (Metformin Hcl) .... Take One Twice Daily With Meals 12)  Lisinopril 40 Mg Tabs (Lisinopril) .... Take One Tablet By Mouth Daily  Allergies (verified): No Known Drug Allergies  Past History:  Past Medical History: Last updated: 03/02/2009 Current Problems:  CARDIOMYOPATHY (ICD-425.4) HYPERTENSION, BENIGN SYSTEMIC (ICD-401.1) CAD (ICD-414.00) HYPERLIPIDEMIA (ICD-272.4) OBESITY (ICD-278.00) ALCOHOL ABUSE (ICD-305.00) SPECIAL SCREENING MALIGNANT NEOPLASM OF PROSTATE (ICD-V76.44) LEG EDEMA, BILATERAL (ICD-782.3) SLE (ICD-710.0) OSTEOARTHRITIS, KNEE, LEFT (ICD-715.96) OSTEOARTHRITIS, BACK (ICD-715.98) lupus  SLE on chronic steriods until 1999.  States had rash only EF of 20  %,LVH  non ischemic cardiomyopathy, sees Dr. Eden Emms at [facility]  lupus erythematosus  Past Surgical History: Last updated: 03/16/2009 cath 05/08 - 40% stenosis in multiple vessels, non-ischemic cardiomyopathy  Family History: Last updated: 07/08/2009  Father:-obese. had an MI age 25. No other heart  disease in the family.   Social History: Last updated: 03/02/2009 Single, no children, lives alone. Denies smoking.  One beer or shot a day at most.   No drugs.  Lives in [place]  Review of Systems       Denies fever, malais, weight loss, blurry vision, decreased visual acuity, cough, sputum, SOB, hemoptysis, pleuritic pain, palpitaitons, heartburn, abdominal pain, melena, lower extremity edema, claudication, or rash.   Vital Signs:  Patient profile:  60 year old male Height:      72 inches Weight:      241 pounds BMI:     32.80 Pulse rate:   61 / minute Resp:     14 per minute BP sitting:   120 / 66  (left arm)  Vitals Entered By: Kem Parkinson (December 27, 2009 10:49 AM)  Physical Exam  General:  Affect appropriate Healthy:  appears stated age HEENT: normal Neck supple with no adenopathy JVP normal no bruits no thyromegaly Lungs  clear with no wheezing and good diaphragmatic motion Heart:  S1/S2 no murmur,rub, gallop or click PMI normal Abdomen: benighn, BS positve, no tenderness, no AAA no bruit.  No HSM or HJR Distal pulses intact with no bruits No edema Neuro non-focal Skin warm and dry    Impression & Recommendations:  Problem # 1:  CARDIOMYOPATHY (ICD-425.4) EF stable by echo continue current meds The following medications were removed from the medication list:    Accupril 40 Mg Tabs (Quinapril hcl) ..... On e tablet by mouth once daily His updated medication list for this problem includes:    Aspir-low 81 Mg Tbec (Aspirin) .Marland Kitchen... 1 once daily after meal    Carvedilol 25 Mg Tabs (Carvedilol) .Marland Kitchen... Take one tablet by mouth twice a day    Lasix 40 Mg Tabs (Furosemide) .Marland Kitchen... Take 1 tab by mouth twice daily    Lisinopril 40 Mg Tabs (Lisinopril) .Marland Kitchen... Take one tablet by mouth daily  Problem # 2:  HYPERTENSION, BENIGN SYSTEMIC (ICD-401.1) Well controlled The following medications were removed from the medication list:    Accupril 40 Mg Tabs (Quinapril hcl) ..... On e tablet by mouth once daily His updated medication list for this problem includes:    Aspir-low 81 Mg Tbec (Aspirin) .Marland Kitchen... 1 once daily after meal    Carvedilol 25 Mg Tabs (Carvedilol) .Marland Kitchen... Take one tablet by mouth twice a day    Lasix 40 Mg Tabs (Furosemide) .Marland Kitchen... Take 1 tab by mouth twice daily    Lisinopril 40 Mg Tabs (Lisinopril) .Marland Kitchen... Take one tablet by mouth daily  Problem # 3:  HYPERLIPIDEMIA (ICD-272.4) Continue diet Rx target LDL less than 130.  F/U labs in 6 months His updated medication list for this problem includes:    Lipitor 80 Mg Tabs (Atorvastatin calcium) ..... One daily at bedtime  CHOL: 225 (01/30/2009)   LDL: 162 (01/30/2009)   HDL: 43 (01/30/2009)   TG: 99 (01/30/2009)  Patient Instructions: 1)  Your physician recommends that you schedule a follow-up appointment in: 6 MONTHS   EKG Report  Procedure date:   12/27/2009  Findings:      NSR 53 Poor R wave progression [facility] normal

## 2010-11-22 NOTE — Miscellaneous (Signed)
Summary: Re: GI referral for colonscopy.  Clinical Lists Changes   received notification from [facility] that they are unable to complete the referral for GI for colonscopy at this time due to lack of volunteer physicians in this speciality group . they will notify patient when they can process referral. Theresia Lo RN  January 01, 2010 1:35 PM

## 2010-11-22 NOTE — Assessment & Plan Note (Signed)
Summary: FU/KH   Vital Signs:  Patient profile:   60 year old male Weight:      235.1 pounds Temp:     98.1 degrees F oral Pulse rate:   61 / minute Pulse rhythm:   regular BP sitting:   117 / 73  (left arm) Cuff size:   large  Vitals Entered By: Loralee Pacas CMA (February 12, 2010 1:33 PM)  Primary Care Provider:  Angelena Sole MD   History of Present Illness: 1. HLD: Pt is taking and tolerating his Lipitor as prescribed.  Lipids last check 01/2009  2. HTN: Pt is taking and tolerating his medications as prescribed.  He doesn't check his blood pressure regularly.        ROS: denies chest pain, shortness of breath  3. DMII:  Pt is taking and tolerating his medications as prescribed.  He does check his blood sugars everyday.  14 day average  ~127,  30 day average  ~ 139.  He is going to Diabetes education classes.      ROS: denies numbness or claudication    Current Medications (verified): 1)  Aspir-Low 81 Mg Tbec (Aspirin) .Marland Kitchen.. 1 Once Daily After Meal 2)  Klor-Con 10 10 Meq Cr-Tabs (Potassium Chloride) .Marland Kitchen.. 1 Tab By Mouth Once Daily 3)  Travatan Z 0.004 % Soln (Travoprost) .Marland Kitchen.. 1 Drop in Both Eyes Once Daily 4)  Carvedilol 25 Mg Tabs (Carvedilol) .... Take One Tablet By Mouth Twice A Day 5)  Lasix 40 Mg Tabs (Furosemide) .... Take 1 Tab By Mouth Twice Daily 6)  Blood Glucose Meter  Kit (Blood Glucose Monitoring Suppl) .... Use As Directed 7)  Lancets  Misc (Lancets) .... Use 1 A Day To Check Your Blood Sugar 8)  Easy Check Glucose Test  Strp (Glucose Blood) .... Use 1 A Day To Check Your Blood Sugar 9)  Ultram 50 Mg Tabs (Tramadol Hcl) .... Take 1 Tab Every 8 Hours As Needed For Pain 10)  Lipitor 80 Mg Tabs (Atorvastatin Calcium) .... One Daily At Bedtime 11)  Metformin Hcl 1000 Mg Tabs (Metformin Hcl) .... Take One Twice Daily With Meals 12)  Lisinopril 40 Mg Tabs (Lisinopril) .... Take One Tablet By Mouth Daily  Allergies: No Known Drug Allergies  Past History:  Past  Medical History: Reviewed history from 03/02/2009 and no changes required. Current Problems:  CARDIOMYOPATHY (ICD-425.4) HYPERTENSION, BENIGN SYSTEMIC (ICD-401.1) CAD (ICD-414.00) HYPERLIPIDEMIA (ICD-272.4) OBESITY (ICD-278.00) ALCOHOL ABUSE (ICD-305.00) SPECIAL SCREENING MALIGNANT NEOPLASM OF PROSTATE (ICD-V76.44) LEG EDEMA, BILATERAL (ICD-782.3) SLE (ICD-710.0) OSTEOARTHRITIS, KNEE, LEFT (ICD-715.96) OSTEOARTHRITIS, BACK (ICD-715.98) lupus  SLE on chronic steriods until 1999.  States had rash only EF of 20  %,LVH  non ischemic cardiomyopathy, sees Dr. Eden Emms at [facility]  lupus erythematosus  Social History: Reviewed history from 03/02/2009 and no changes required. Single, no children, lives alone. Denies smoking.  One beer or shot a day at most.   No drugs.  Lives in [place]  Physical Exam  General:  Vitals reviewed.  alert, well-hydrated, and overweight-appearing.   Eyes:  vision grossly intact, pupils equal, pupils round, and pupils reactive to light.   Mouth:  fair dentition.   Neck:  no JVD Lungs:  normal respiratory effort, no intercostal retractions, and no wheezes.   Heart:  normal rate and regular rhythm.   Abdomen:  soft and non-tender.   Extremities:  no lower extremity edema Psych:  alert and cooperative, not depressed appearing   Impression & Recommendations:  Problem #  1:  DIABETES MELLITUS (ICD-250.00) Assessment Unchanged  At goal, continue current medications His updated medication list for this problem includes:    Aspir-low 81 Mg Tbec (Aspirin) .Marland Kitchen... 1 once daily after meal    Metformin Hcl 1000 Mg Tabs (Metformin hcl) .Marland Kitchen... Take one twice daily with meals    Lisinopril 40 Mg Tabs (Lisinopril) .Marland Kitchen... Take one tablet by mouth daily  Orders: [facility]- Est  Level 4 (11914)  Problem # 2:  HYPERTENSION, BENIGN SYSTEMIC (ICD-401.1) Assessment: Unchanged  At goal, continue current medications His updated medication list for this problem includes:     Carvedilol 25 Mg Tabs (Carvedilol) .Marland Kitchen... Take one tablet by mouth twice a day    Lasix 40 Mg Tabs (Furosemide) .Marland Kitchen... Take 1 tab by mouth twice daily    Lisinopril 40 Mg Tabs (Lisinopril) .Marland Kitchen... Take one tablet by mouth daily  Orders: [facility]- Est  Level 4 (78295)  Problem # 3:  HYPERLIPIDEMIA (ICD-272.4) Assessment: Unchanged Will check Lipids and CMET. His updated medication list for this problem includes:    Lipitor 80 Mg Tabs (Atorvastatin calcium) ..... One daily at bedtime  Orders: [facility]- Est  Level 4 (99214)Future Orders: Lipid-[facility] ([id number]) ... 01/24/2011  Complete Medication List: 1)  Aspir-low 81 Mg Tbec (Aspirin) .Marland Kitchen.. 1 once daily after meal 2)  Klor-con 10 10 Meq Cr-tabs (Potassium chloride) .Marland Kitchen.. 1 tab by mouth once daily 3)  Travatan Z 0.004 % Soln (Travoprost) .Marland Kitchen.. 1 drop in both eyes once daily 4)  Carvedilol 25 Mg Tabs (Carvedilol) .... Take one tablet by mouth twice a day 5)  Lasix 40 Mg Tabs (Furosemide) .... Take 1 tab by mouth twice daily 6)  Blood Glucose Meter Kit (Blood glucose monitoring suppl) .... Use as directed 7)  Lancets Misc (Lancets) .... Use 1 a day to check your blood sugar 8)  Easy Check Glucose Test Strp (Glucose blood) .... Use 1 a day to check your blood sugar 9)  Ultram 50 Mg Tabs (Tramadol hcl) .... Take 1 tab every 8 hours as needed for pain 10)  Lipitor 80 Mg Tabs (Atorvastatin calcium) .... One daily at bedtime 11)  Metformin Hcl 1000 Mg Tabs (Metformin hcl) .... Take one twice daily with meals 12)  Lisinopril 40 Mg Tabs (Lisinopril) .... Take one tablet by mouth daily  Other Orders: Future Orders: T-Hepatic Function [id number]) ... 01/31/2011  Patient Instructions: 1)  You are doing great 2)  I am very pleased with your blood pressure and your diabetes.  Keep up the good work! 3)  I would like to check your cholesterol to see how it is doing 4)  You cannot eat for 8 hours prior to the test to make sure that we have an accurate  result 5)  Please schedule a time for you to come in for a lab visit to get your blood drawn. 6)  Please schedule a follow up appointment in 3 months  Prevention & Chronic Care Immunizations   Influenza vaccine: Fluvax Non-MCR  (08/15/2009)   Influenza vaccine due: 09/01/2008    Tetanus booster: 01/17/2009: Tdap    Pneumococcal vaccine: Not documented  Colorectal Screening   Hemoccult: Not documented    Colonoscopy: Not documented   Colonoscopy action/deferral: GI Referral  (12/29/2009)  Other Screening   PSA: 0.41  (11/04/2007)   PSA due due: 11/03/2008   Smoking status: quit > 6 months  (12/29/2009)  Diabetes Mellitus   HgbA1C: 6.6  (12/29/2009)    Eye exam: Vision 20/20 B  No diabetic retinopathy  (07/20/2009)    Foot exam: Not documented   Foot exam action/deferral: Do today   High risk foot: Not documented   Foot care education: Not documented    Urine microalbumin/creatinine ratio: Not documented    Diabetes flowsheet reviewed?: Yes   Progress toward A1C goal: Unchanged  Lipids   Total Cholesterol: 225  (01/30/2009)   LDL: 162  (01/30/2009)   LDL Direct: Not documented   HDL: 43  (01/30/2009)   Triglycerides: 99  (01/30/2009)    SGOT (AST): Not documented   BMP action: Ordered   SGPT (ALT): Not documented   Alkaline phosphatase: Not documented   Total bilirubin: Not documented    Lipid flowsheet reviewed?: Yes   Progress toward LDL goal: Unchanged  Hypertension   Last Blood Pressure: 117 / 73  (02/12/2010)   Serum creatinine: 1.3  (06/05/2009)   Serum potassium 4.9  (06/05/2009)    Hypertension flowsheet reviewed?: Yes   Progress toward BP goal: Unchanged  Self-Management Support :   Personal Goals (by the next clinic visit) :     Personal A1C goal: 8  (09/19/2009)     Personal blood pressure goal: 130/80  (07/28/2009)     Personal LDL goal: 100  (07/28/2009)    Diabetes self-management support: CBG self-monitoring log, Written self-care  plan, Education handout, Pre-printed educational material  (08/15/2009)    Hypertension self-management support: Not documented    Hypertension self-management support not done because: Good outcomes  (08/15/2009)    Lipid self-management support: Not documented

## 2010-11-22 NOTE — Assessment & Plan Note (Signed)
Summary: f/up,tcb   Vital Signs:  Patient profile:   60 year old male Height:      72 inches Weight:      233 pounds BMI:     31.71 Temp:     98.6 degrees F oral Pulse rate:   57 / minute BP sitting:   139 / 69  (left arm) Cuff size:   large  Vitals Entered By: Jimmy Footman, CMA (July 18, 2010 8:54 AM) CC: f/u Pain Assessment Patient in pain? no        Primary Care Provider:  Angelena Sole MD  CC:  f/u.  History of Present Illness: 1. HTN:  Pt is taking and tolerating his medicines as prescribed.  He doesn't check his blood pressure at home regularly.    ROS: denies chest pain, shortness of breath  2. DMII:  Pt is taking and tolerating his medicines as prescribed.  He does check his blood sugars at home regularly.  They have been averaging around 130.  ROS: denies numbness / weakness or vision changes  3. HLD:  Pt is taking and tolerating his medicines as prescribed.   ROS:  denies claudication  4. Gout:  Diagnosed by Dr. Eden Emms.  He was having migratory joint pain.  Now improved with Allopurinol.  ROS: denies any active joint pains  Habits & Providers  Alcohol-Tobacco-Diet     Alcohol drinks/day: 1     Alcohol type: beer     Feels need to cut down: no     Tobacco Status: quit > 6 months     Year Quit: 2002  Current Medications (verified): 1)  Aspir-Low 81 Mg Tbec (Aspirin) .Marland Kitchen.. 1 Once Daily After Meal 2)  Klor-Con 10 10 Meq Cr-Tabs (Potassium Chloride) .Marland Kitchen.. 1 Tab By Mouth Once Daily 3)  Travatan Z 0.004 % Soln (Travoprost) .Marland Kitchen.. 1 Drop in Both Eyes Once Daily 4)  Carvedilol 25 Mg Tabs (Carvedilol) .... Take One Tablet By Mouth Twice A Day 5)  Lasix 40 Mg Tabs (Furosemide) .... Take 1 Tab By Mouth Twice Daily 6)  Blood Glucose Meter  Kit (Blood Glucose Monitoring Suppl) .... Use As Directed 7)  Lancets  Misc (Lancets) .... Use 1 A Day To Check Your Blood Sugar 8)  Easy Check Glucose Test  Strp (Glucose Blood) .... Use 1 A Day To Check Your Blood  Sugar 9)  Ultram 50 Mg Tabs (Tramadol Hcl) .... Take 1 Tab Every 8 Hours As Needed For Pain 10)  Lipitor 80 Mg Tabs (Atorvastatin Calcium) .... One Daily At Bedtime 11)  Metformin Hcl 1000 Mg Tabs (Metformin Hcl) .... Take One Twice Daily With Meals 12)  Lisinopril 40 Mg Tabs (Lisinopril) .... Take One Tablet By Mouth Daily 13)  Allopurinol 100 Mg Tabs (Allopurinol) .... Please Take 1 Tablet By Mouth Daily.  Allergies: No Known Drug Allergies  Past History:  Past Medical History: Reviewed history from 03/02/2009 and no changes required. Current Problems:  CARDIOMYOPATHY (ICD-425.4) HYPERTENSION, BENIGN SYSTEMIC (ICD-401.1) CAD (ICD-414.00) HYPERLIPIDEMIA (ICD-272.4) OBESITY (ICD-278.00) ALCOHOL ABUSE (ICD-305.00) SPECIAL SCREENING MALIGNANT NEOPLASM OF PROSTATE (ICD-V76.44) LEG EDEMA, BILATERAL (ICD-782.3) SLE (ICD-710.0) OSTEOARTHRITIS, KNEE, LEFT (ICD-715.96) OSTEOARTHRITIS, BACK (ICD-715.98) lupus  SLE on chronic steriods until 1999.  States had rash only EF of 20  %,LVH  non ischemic cardiomyopathy, sees Dr. Eden Emms at [facility]  lupus erythematosus  Social History: Reviewed history from 03/02/2009 and no changes required. Single, no children, lives alone. Denies smoking.  One beer or shot a day at  most.   No drugs.  Lives in [place]  Physical Exam  General:  Vitals reviewed.  alert, well-hydrated, and overweight-appearing.   Mouth:  fair dentition.   Neck:  no JVD Lungs:  normal respiratory effort, no intercostal retractions, and no wheezes.   Heart:  normal rate and regular rhythm.   No murmur appreciated. Msk:  no joint tenderness, no joint swelling, no joint warmth, and no redness over joints.   Extremities:  no lower extremity edema Psych:  alert and cooperative, not depressed appearing   Impression & Recommendations:  Problem # 1:  HYPERTENSION, BENIGN SYSTEMIC (ICD-401.1) Assessment Unchanged  At goal.  Continue current medications His updated  medication list for this problem includes:    Carvedilol 25 Mg Tabs (Carvedilol) .Marland Kitchen... Take one tablet by mouth twice a day    Lasix 40 Mg Tabs (Furosemide) .Marland Kitchen... Take 1 tab by mouth twice daily    Lisinopril 40 Mg Tabs (Lisinopril) .Marland Kitchen... Take one tablet by mouth daily  Orders: [facility]- Est  Level 4 (06237)  Problem # 2:  DIABETES MELLITUS (ICD-250.00) Assessment: Unchanged At goal.  Will check A1C today. His updated medication list for this problem includes:    Aspir-low 81 Mg Tbec (Aspirin) .Marland Kitchen... 1 once daily after meal    Metformin Hcl 1000 Mg Tabs (Metformin hcl) .Marland Kitchen... Take one twice daily with meals    Lisinopril 40 Mg Tabs (Lisinopril) .Marland Kitchen... Take one tablet by mouth daily  Orders: A1C-[facility] (62831) [facility]- Est  Level 4 (51761)  Problem # 3:  HYPERLIPIDEMIA (ICD-272.4) Assessment: Unchanged Last lipids at goal.  Will check direct LDL today. His updated medication list for this problem includes:    Lipitor 80 Mg Tabs (Atorvastatin calcium) ..... One daily at bedtime  Orders: Direct LDL-[facility] ([address]37-10626) [facility]- Est  Level 4 (94854)  Problem # 4:  GOUT, UNSPECIFIED (ICD-274.9) Assessment: Improved  Improved with Allopurinol.  No active joint pains.  His updated medication list for this problem includes:    Allopurinol 100 Mg Tabs (Allopurinol) .Marland Kitchen... Please take 1 tablet by mouth daily.  Orders: [facility]- Est  Level 4 (99214)  Complete Medication List: 1)  Aspir-low 81 Mg Tbec (Aspirin) .Marland Kitchen.. 1 once daily after meal 2)  Klor-con 10 10 Meq Cr-tabs (Potassium chloride) .Marland Kitchen.. 1 tab by mouth once daily 3)  Travatan Z 0.004 % Soln (Travoprost) .Marland Kitchen.. 1 drop in both eyes once daily 4)  Carvedilol 25 Mg Tabs (Carvedilol) .... Take one tablet by mouth twice a day 5)  Lasix 40 Mg Tabs (Furosemide) .... Take 1 tab by mouth twice daily 6)  Blood Glucose Meter Kit (Blood glucose monitoring suppl) .... Use as directed 7)  Lancets Misc (Lancets) .... Use 1 a day to check your blood sugar 8)  Easy  Check Glucose Test Strp (Glucose blood) .... Use 1 a day to check your blood sugar 9)  Ultram 50 Mg Tabs (Tramadol hcl) .... Take 1 tab every 8 hours as needed for pain 10)  Lipitor 80 Mg Tabs (Atorvastatin calcium) .... One daily at bedtime 11)  Metformin Hcl 1000 Mg Tabs (Metformin hcl) .... Take one twice daily with meals 12)  Lisinopril 40 Mg Tabs (Lisinopril) .... Take one tablet by mouth daily 13)  Allopurinol 100 Mg Tabs (Allopurinol) .... Please take 1 tablet by mouth daily.  Patient Instructions: 1)  It was good to see you today 2)  We will check your cholesterol and HgA1C today and will let you know of the results  3)  Please schedule a follow up appointment in 6 months  Appended Document: A1c  6.4 %    Lab Visit  Laboratory Results   Blood Tests   Date/Time Received: July 18, 2010 9:17 AM  Date/Time Reported: July 18, 2010 10:00 AM   HGBA1C: 6.4 %   (Normal Range: Non-Diabetic - 3-6%   Control Diabetic - 6-8%)  Comments: ...............test performed by......Marland KitchenBonnie A. [place], MLS (ASCP)cm    Orders Today:

## 2010-11-22 NOTE — Consult Note (Signed)
Summary: [facility] [employer]  [facility] [employer]   Imported By: Clydell Hakim 02/08/2010 11:57:14  _____________________________________________________________________  External Attachment:    Type:   Image     Comment:   External Document

## 2010-11-22 NOTE — Letter (Signed)
Summary: Appointment - Reminder 2  [facility], Main Office  [address]   [place], [place] [address]   Phone: [phone]  Fax: [phone]     November 15, 2009 MRN: [id number]   Blake Wilson [address] [place], [place]  [address]   Dear [name],  Our records indicate that it is time to schedule a follow-up appointment with Dr. Eden Emms. It is very important that we reach you to schedule this appointment. We look forward to participating in your health care needs. Please contact us at the number listed above at your earliest convenience to schedule your appointment.  If you are unable to make an appointment at this time, give [place] a call so we can update our records.     Sincerely,   Glass blower/designer

## 2010-11-22 NOTE — Miscellaneous (Signed)
Summary: received flu vaccine  Clinical Lists Changes received notice from [facility] that patient received flu vaccine 09/28/2010. Theresia Lo RN  September 28, 2010 11:52 AM  Observations: Added new observation of FLU VAX: Historical (09/28/2010 11:50)      Influenza Immunization History:    Influenza # 1:  Historical (09/28/2010)

## 2010-11-22 NOTE — Miscellaneous (Signed)
Summary: Orders Update  Clinical Lists Changes  Orders: Added new Test order of TLB-Uric Acid, Blood (84550-URIC) - Signed

## 2010-11-22 NOTE — Assessment & Plan Note (Signed)
Summary: f/u eo   Vital Signs:  Patient profile:   60 year old male Height:      72 inches Weight:      226.44 pounds BMI:     30.82 BSA:     2.25 Temp:     98.7 degrees F Pulse rate:   65 / minute BP sitting:   124 / 66  Vitals Entered By: Jone Baseman CMA (November 01, 2010 8:38 AM) CC: F/U Is Patient Diabetic? Yes Did you bring your meter with you today? Yes Pain Assessment Patient in pain? no        Primary Care Provider:  Angelena Sole MD  CC:  F/U.  History of Present Illness: 1. DMII:  Pt is taking his medicines as prescribed.  He checks his blood sugar twice a day.  He is averaging around 120.  He has also lost some weight recently.  ROS: denies vision changes, numbness/weakness  2. HTN:  Pt is taking his medicines as prescribed.  He doesn't check his blood pressure at home regularly.    ROS: denies chest pain, shortness of breath  3. Loss of taste:  He has had this sensation like things don't taste the way that they used to.  It has been happening for about 3 months.  It is not that bothersome to him but he just wanted to know what was causing it.  It actually helps him not want to finish a meal.  The only relatively new medicine is Allopurinol.  ROS: denies change in hearing, smell, or other sensations  4. Gout:  No active inflamed joints.  Taking the Allopurinol.  Habits & Providers  Alcohol-Tobacco-Diet     Alcohol drinks/day: 1     Alcohol type: beer     Feels need to cut down: no     Tobacco Status: quit > 6 months     Year Quit: 2002  Current Medications (verified): 1)  Aspir-Low 81 Mg Tbec (Aspirin) .Marland Kitchen.. 1 Once Daily After Meal 2)  Klor-Con 10 10 Meq Cr-Tabs (Potassium Chloride) .Marland Kitchen.. 1 Tab By Mouth Once Daily 3)  Travatan Z 0.004 % Soln (Travoprost) .Marland Kitchen.. 1 Drop in Both Eyes Once Daily 4)  Carvedilol 25 Mg Tabs (Carvedilol) .... Take One Tablet By Mouth Twice A Day 5)  Lasix 40 Mg Tabs (Furosemide) .... Take 1 Tab By Mouth Twice Daily 6)   Blood Glucose Meter  Kit (Blood Glucose Monitoring Suppl) .... Use As Directed 7)  Lancets  Misc (Lancets) .... Use 1 A Day To Check Your Blood Sugar 8)  Easy Check Glucose Test  Strp (Glucose Blood) .... Use 1 A Day To Check Your Blood Sugar 9)  Ultram 50 Mg Tabs (Tramadol Hcl) .... Take 1 Tab Every 8 Hours As Needed For Pain 10)  Lipitor 80 Mg Tabs (Atorvastatin Calcium) .... One Daily At Bedtime 11)  Metformin Hcl 1000 Mg Tabs (Metformin Hcl) .... Take One Twice Daily With Meals 12)  Lisinopril 40 Mg Tabs (Lisinopril) .... Take One Tablet By Mouth Daily 13)  Allopurinol 100 Mg Tabs (Allopurinol) .... Please Take 1 Tablet By Mouth Daily.  Allergies: No Known Drug Allergies  Past History:  Past Medical History: Reviewed history from 03/02/2009 and no changes required. Current Problems:  CARDIOMYOPATHY (ICD-425.4) HYPERTENSION, BENIGN SYSTEMIC (ICD-401.1) CAD (ICD-414.00) HYPERLIPIDEMIA (ICD-272.4) OBESITY (ICD-278.00) ALCOHOL ABUSE (ICD-305.00) SPECIAL SCREENING MALIGNANT NEOPLASM OF PROSTATE (ICD-V76.44) LEG EDEMA, BILATERAL (ICD-782.3) SLE (ICD-710.0) OSTEOARTHRITIS, KNEE, LEFT (ICD-715.96) OSTEOARTHRITIS, BACK (ICD-715.98) lupus  SLE on  chronic steriods until 1999.  States had rash only EF of 20  %,LVH  non ischemic cardiomyopathy, sees Dr. Eden Emms at [facility]  lupus erythematosus  Social History: Reviewed history from 03/02/2009 and no changes required. Single, no children, lives alone. Denies smoking.  One beer or shot a day at most.   No drugs.  Lives in [place]  Physical Exam  General:  Vitals reviewed.  alert, well-hydrated, and overweight-appearing.   Neck:  no JVD Lungs:  normal respiratory effort, no intercostal retractions, and no wheezes.   Heart:  normal rate and regular rhythm.   No murmur appreciated. Abdomen:  soft and non-tender.   Msk:  no joint tenderness, no joint swelling, no joint warmth, and no redness over joints.   Extremities:  no lower  extremity edema Psych:  alert and cooperative, not depressed appearing   Impression & Recommendations:  Problem # 1:  DIABETES MELLITUS (ICD-250.00) Assessment Improved A1C at goal.  Doing well with his diet and trying to lose weight.  Continue current medications. His updated medication list for this problem includes:    Aspir-low 81 Mg Tbec (Aspirin) .Marland Kitchen... 1 once daily after meal    Metformin Hcl 1000 Mg Tabs (Metformin hcl) .Marland Kitchen... Take one twice daily with meals    Lisinopril 40 Mg Tabs (Lisinopril) .Marland Kitchen... Take one tablet by mouth daily  Orders: A1C-[facility] (62130) [facility]- Est  Level 4 (86578)  Problem # 2:  HYPERTENSION, BENIGN SYSTEMIC (ICD-401.1) Assessment: Unchanged  BP at goal.  Continue current medications His updated medication list for this problem includes:    Carvedilol 25 Mg Tabs (Carvedilol) .Marland Kitchen... Take one tablet by mouth twice a day    Lasix 40 Mg Tabs (Furosemide) .Marland Kitchen... Take 1 tab by mouth twice daily    Lisinopril 40 Mg Tabs (Lisinopril) .Marland Kitchen... Take one tablet by mouth daily  Orders: [facility]- Est  Level 4 (99214)  Problem # 3:  DISTURBANCES OF SENSATION OF SMELL AND TASTE (ICD-781.1) Assessment: New  Likely from the Allopurinol.  It is one of the reported side effects and is the only new medicine in the past couple of months.  It is not bothersome to him and is not interested in switching ppx medicines at this time.  Advised him to let me know if it gets worse.  Orders: [facility]- Est  Level 4 (46962)  Problem # 4:  GOUT, UNSPECIFIED (ICD-274.9) Assessment: Improved  Continue Allopurinol.  If change in taste becomes an issue could consider switching to Probenicid. His updated medication list for this problem includes:    Allopurinol 100 Mg Tabs (Allopurinol) .Marland Kitchen... Please take 1 tablet by mouth daily.  Orders: [facility]- Est  Level 4 (99214)  Complete Medication List: 1)  Aspir-low 81 Mg Tbec (Aspirin) .Marland Kitchen.. 1 once daily after meal 2)  Klor-con 10 10 Meq Cr-tabs (Potassium  chloride) .Marland Kitchen.. 1 tab by mouth once daily 3)  Travatan Z 0.004 % Soln (Travoprost) .Marland Kitchen.. 1 drop in both eyes once daily 4)  Carvedilol 25 Mg Tabs (Carvedilol) .... Take one tablet by mouth twice a day 5)  Lasix 40 Mg Tabs (Furosemide) .... Take 1 tab by mouth twice daily 6)  Blood Glucose Meter Kit (Blood glucose monitoring suppl) .... Use as directed 7)  Lancets Misc (Lancets) .... Use 1 a day to check your blood sugar 8)  Easy Check Glucose Test Strp (Glucose blood) .... Use 1 a day to check your blood sugar 9)  Ultram 50 Mg Tabs (Tramadol hcl) .Marland KitchenMarland KitchenMarland Kitchen  Take 1 tab every 8 hours as needed for pain 10)  Lipitor 80 Mg Tabs (Atorvastatin calcium) .... One daily at bedtime 11)  Metformin Hcl 1000 Mg Tabs (Metformin hcl) .... Take one twice daily with meals 12)  Lisinopril 40 Mg Tabs (Lisinopril) .... Take one tablet by mouth daily 13)  Allopurinol 100 Mg Tabs (Allopurinol) .... Please take 1 tablet by mouth daily.  Patient Instructions: 1)  Your A1C was good today at 6.3 2)  Keep up the good work and continue to take your medicines as prescribed 3)  If the loss of taste becomes bothersome to you please let me know 4)  I have sent in all of your prescriptions to [facility] 5)  Schedule a follow up appointment in 3 months Prescriptions: ALLOPURINOL 100 MG TABS (ALLOPURINOL) Please take 1 tablet by mouth daily.  #30 x 6   Entered and Authorized by:   Angelena Sole MD   Signed by:   Angelena Sole MD on 11/01/2010   Method used:   Printed then faxed to ...       [facility] (retail)       [address]       [place], [place]  [address]       Ph: [phone] or [phone]       Fax: [phone]   RxID:   [id number] LISINOPRIL 40 MG TABS (LISINOPRIL) Take one tablet by mouth daily  #90 x 3   Entered and Authorized by:   Angelena Sole MD   Signed by:   Angelena Sole MD on 11/01/2010   Method used:   Printed then faxed to ...       [facility] (retail)       [address]       [place], [place]  [address]       Ph: [phone] or [phone]       Fax: [phone]   RxID:   [id number] METFORMIN HCL 1000 MG TABS (METFORMIN HCL) take one twice daily with meals  #180 x 3   Entered and Authorized by:   Angelena Sole MD   Signed by:   Angelena Sole MD on 11/01/2010   Method used:   Printed then faxed to ...       [facility] (retail)       [address]       [place], [place]  [address]       Ph: [phone] or [phone]       Fax: [phone]   RxID:   [id number] LIPITOR 80 MG TABS (ATORVASTATIN CALCIUM) one daily at bedtime  #90 x 3   Entered and Authorized by:   Angelena Sole MD   Signed by:   Angelena Sole MD on 11/01/2010   Method used:   Printed then faxed to ...       [facility] (retail)       [address]       [place], [place]  [address]       Ph: [phone] or [phone]       Fax: [phone]   RxID:   [id number] ULTRAM 50 MG TABS (TRAMADOL HCL) Take 1 tab every 8 hours  as needed for pain  #90 x 3   Entered and Authorized by:   Angelena Sole MD   Signed by:   Angelena Sole MD on 11/01/2010   Method used:   Printed then faxed to ...       [facility] (retail)       [address]       [place], [place]  [address]       Ph: [phone] or [phone]       Fax: [phone]   RxID:   [id number] EASY CHECK GLUCOSE TEST  STRP (GLUCOSE BLOOD) Use 1 a day to check your blood sugar  #90 x 3   Entered and Authorized by:   Angelena Sole MD   Signed by:   Angelena Sole MD on 11/01/2010   Method used:   Printed then faxed to ...       [facility] (retail)       [address]       [place], [place]  [address]        Ph: [phone] or [phone]       Fax: [phone]   RxID:   [id number] LANCETS  MISC (LANCETS) Use 1 a day to check your blood sugar  #90 x 3   Entered and Authorized by:   Angelena Sole MD   Signed by:   Angelena Sole MD on 11/01/2010   Method used:   Printed then faxed to ...       [facility] (retail)       [address]       [place], [place]  [address]       Ph: [phone] or [phone]       Fax: [phone]   RxID:   [id number] LASIX 40 MG TABS (FUROSEMIDE) take 1 tab by mouth twice daily  #60 x 12   Entered and Authorized by:   Angelena Sole MD   Signed by:   Angelena Sole MD on 11/01/2010   Method used:   Printed then faxed to ...       [facility] (retail)       [address]       [place], [place]  [address]       Ph: [phone] or [phone]       Fax: [phone]   RxID:   [id number] CARVEDILOL 25 MG TABS (CARVEDILOL) Take one tablet by mouth twice a day  #60 x 6   Entered and Authorized by:   Angelena Sole MD   Signed by:   Angelena Sole MD on 11/01/2010   Method used:   Printed then faxed to ...       [facility] (retail)       [address]       [place], [place]  [address]       Ph: [phone] or [phone]       Fax: [phone]   RxID:   [id number] TRAVATAN Z 0.004 % SOLN (TRAVOPROST) 1 drop in both eyes once daily  #1 x 3   Entered and Authorized by:   Angelena Sole MD  Signed by:   Angelena Sole MD on 11/01/2010   Method used:   Printed then faxed to ...       [facility] (retail)       [address]       [place], [place]  [address]       Ph: [phone] or [phone]       Fax: [phone]   RxID:   [id number] KLOR-CON 10 10 MEQ CR-TABS (POTASSIUM CHLORIDE) 1 tab by mouth once daily  #90 x  3   Entered and Authorized by:   Angelena Sole MD   Signed by:   Angelena Sole MD on 11/01/2010   Method used:   Printed then faxed to ...       [facility] (retail)       [address]       [place], [place]  [address]       Ph: [phone] or [phone]       Fax: [phone]   RxID:   [id number] ASPIR-LOW 81 MG TBEC (ASPIRIN) 1 once daily after meal  #30 x 6   Entered and Authorized by:   Angelena Sole MD   Signed by:   Angelena Sole MD on 11/01/2010   Method used:   Printed then faxed to ...       [facility] (retail)       [address]       [place], [place]  [address]       Ph: [phone] or [phone]       Fax: [phone]   RxID:   [id number]    Orders Added: 1)  A1C-[facility] [83036] 2)  [facility]- Est  Level 4 [81829]    Laboratory Results   Blood Tests   Date/Time Received: November 01, 2010 8:33 AM  Date/Time Reported: November 01, 2010 9:00 AM   HGBA1C: 6.3%   (Normal Range: Non-Diabetic - 3-6%   Control Diabetic - 6-8%)  Comments: ...............test performed by......Marland KitchenBonnie A. [place], MLS (ASCP)cm

## 2010-12-28 ENCOUNTER — Ambulatory Visit: Payer: Self-pay | Admitting: Cardiovascular Disease

## 2011-01-03 ENCOUNTER — Telehealth (INDEPENDENT_AMBULATORY_CARE_PROVIDER_SITE_OTHER): Payer: Self-pay | Admitting: *Deleted

## 2011-01-08 NOTE — Progress Notes (Signed)
  Request received from [facility] asking for records from 2009-present forwarded to [facility] . [facility]  January 03, 2011 9:39 AM

## 2011-01-15 ENCOUNTER — Other Ambulatory Visit: Payer: Self-pay | Admitting: Gastroenterology

## 2011-01-15 ENCOUNTER — Ambulatory Visit (HOSPITAL_COMMUNITY)
Admission: RE | Admit: 2011-01-15 | Discharge: 2011-01-15 | Disposition: A | Discharge status: Home / Self Care | Payer: Medicare HMO | Source: Ambulatory Visit | Attending: Gastroenterology | Admitting: Gastroenterology

## 2011-01-15 ENCOUNTER — Encounter: Payer: Self-pay | Admitting: Cardiovascular Disease

## 2011-01-15 DIAGNOSIS — D126 Benign neoplasm of colon, unspecified: Secondary | ICD-10-CM | POA: Insufficient documentation

## 2011-01-15 DIAGNOSIS — K648 Other hemorrhoids: Secondary | ICD-10-CM | POA: Insufficient documentation

## 2011-01-15 DIAGNOSIS — M329 Systemic lupus erythematosus, unspecified: Secondary | ICD-10-CM | POA: Insufficient documentation

## 2011-01-15 DIAGNOSIS — K5730 Diverticulosis of large intestine without perforation or abscess without bleeding: Secondary | ICD-10-CM | POA: Insufficient documentation

## 2011-01-15 DIAGNOSIS — I10 Essential (primary) hypertension: Secondary | ICD-10-CM | POA: Insufficient documentation

## 2011-01-15 DIAGNOSIS — I509 Heart failure, unspecified: Secondary | ICD-10-CM | POA: Insufficient documentation

## 2011-01-15 DIAGNOSIS — E119 Type 2 diabetes mellitus without complications: Secondary | ICD-10-CM | POA: Insufficient documentation

## 2011-01-24 ENCOUNTER — Ambulatory Visit (INDEPENDENT_AMBULATORY_CARE_PROVIDER_SITE_OTHER): Payer: Medicare HMO | Admitting: Cardiovascular Disease

## 2011-01-24 ENCOUNTER — Encounter: Payer: Self-pay | Admitting: Cardiovascular Disease

## 2011-01-24 DIAGNOSIS — I428 Other cardiomyopathies: Secondary | ICD-10-CM

## 2011-01-24 DIAGNOSIS — E785 Hyperlipidemia, unspecified: Secondary | ICD-10-CM

## 2011-01-24 DIAGNOSIS — I10 Essential (primary) hypertension: Secondary | ICD-10-CM

## 2011-01-24 LAB — GLUCOSE, CAPILLARY: Glucose-Capillary: 141 mg/dL — ABNORMAL HIGH (ref 70–99)

## 2011-01-24 NOTE — Assessment & Plan Note (Signed)
Functinal class one.  Contineu current meds.  Echo in 6 months

## 2011-01-24 NOTE — Patient Instructions (Signed)
Your physician recommends that you schedule a follow-up appointment in: 6 MONTHS 

## 2011-01-24 NOTE — Assessment & Plan Note (Signed)
Well controlled.  Continue current medications and low sodium Dash type diet.    

## 2011-01-24 NOTE — Assessment & Plan Note (Signed)
Cholesterol is at goal.  Continue current dose of statin and diet Rx.  No myalgias or side effects.  F/U  LFT's in 6 months. Lab Results  Component Value Date   LDLCALC 78 02/13/2010

## 2011-01-24 NOTE — Progress Notes (Signed)
Blake Wilson is seen today for F/U of nonischemic DCM with HTN and elevated lipids.  He is doing well with no SOB, palpitations, edema or SSCP.  His last echo in March 2011  which I reviewed showed  mild LVE with mild diffuse hypokinesis EF 50-55% and mild MR.  He has been compliant with his meds which has been an issue in the past.  He is not working but says he goes to a gym 2-3 times / week and "walks around" He has had recurrent bouts of gout in his feet.  Unfortuanatley he had some elephant glue explode on his face and had some wounds and scarring on his right cheek  ROS: Denies fever, malais, weight loss, blurry vision, decreased visual acuity, cough, sputum, SOB, hemoptysis, pleuritic pain, palpitaitons, heartburn, abdominal pain, melena, lower extremity edema, claudication, or rash.   General: Affect appropriate Healthy:  appears stated age HEENT: normal Neck supple with no adenopathy JVP normal no bruits no thyromegaly Lungs clear with no wheezing and good diaphragmatic motion Heart:  S1/S2 no murmur,rub, gallop or click PMI normal Abdomen: benighn, BS positve, no tenderness, no AAA no bruit.  No HSM or HJR Distal pulses intact with no bruits No edema Neuro non-focal Skin warm and dry lesions on face from recent glue exposure No muscular weakness   Current Outpatient Prescriptions  Medication Sig Dispense Refill  . atorvastatin (LIPITOR) 80 MG tablet Take 80 mg by mouth at bedtime.        . Blood Glucose Monitoring Suppl (BLOOD GLUCOSE METER) kit by Other route. Check sugars once a day.       . carvedilol (COREG) 25 MG tablet Take 25 mg by mouth 2 (two) times daily.        . furosemide (LASIX) 40 MG tablet Take 40 mg by mouth 2 (two) times daily.        Marland Kitchen lisinopril (PRINIVIL,ZESTRIL) 40 MG tablet Take 40 mg by mouth daily.        . metFORMIN (GLUCOPHAGE) 1000 MG tablet Take 1,000 mg by mouth 2 (two) times daily with meals.        . potassium chloride (KLOR-CON) 10 MEQ CR tablet  Take 10 mEq by mouth daily.        . travoprost, benzalkonium, (TRAVATAN) 0.004 % ophthalmic solution Place 1 drop into both eyes at bedtime.        Marland Kitchen aspirin 81 MG tablet Take 81 mg by mouth daily. After a meal       . DISCONTD: allopurinol (ZYLOPRIM) 100 MG tablet Take 100 mg by mouth daily.        Marland Kitchen DISCONTD: traMADol (ULTRAM) 50 MG tablet Take 50 mg by mouth every 8 (eight) hours as needed.          Allergies  Review of patient's allergies indicates no known allergies.  Electrocardiogram:  Assessment and Plan

## 2011-02-06 NOTE — Op Note (Signed)
  NAME:  Blake Wilson, Blake Wilson              ACCOUNT NO.:  [id number]  MEDICAL RECORD NO.:  [id number]           PATIENT TYPE:  O  LOCATION:  WLEN                         FACILITY:  [facility]  PHYSICIAN:  Shirley Friar, MDDATE OF BIRTH:  1951/01/29  DATE OF PROCEDURE: DATE OF DISCHARGE:                              OPERATIVE REPORT   PROCEDURE:  Colonoscopy.  INDICATIONS:  Heme-positive stool.  MEDICATIONS: 1. Fentanyl 125 mcg IV. 2. Versed 12 mg IV.  FINDINGS:  Rectal exam was unremarkable.  The pediatric colonoscope was inserted into well prepped colon and advanced to cecum where ileocecal valve and appendiceal orifice were identified.  In order to reach the cecum, repeated loop reduction was necessary as well as was a small amount of abdominal pressure.  On careful withdrawal of colonoscope, 1 cm pedunculated sessile polyp was seen in the descending colon that was removed with snare cautery.  No immediate bleeding was noted in the polypectomy site.  On further withdrawal of the colonoscope, there were scattered small and large diverticula in the descending and sigmoid colon.  Within the mid part of the sigmoid colon was a 5-mm sessile polyp that was removed with snare cautery.  In the distal sigmoid colon was a 2-cm pedunculated polyp that was removed with snare cautery without any immediate complications.  A 2.5 cc of [place] ink was injected around the polypectomy site for tattooing.  Retroflexion revealed small internal hemorrhoids.  ASSESSMENT: 1. Colon polyps x3 as stated above, largest being in the distal     sigmoid colon. 2. Left-sided diverticulosis. 3. Internal hemorrhoids.  PLAN: 1. Follow up on path. 2. No aspirin products for 2 weeks. 3. High-fiber diet.    Shirley Friar, MD    VCS/MEDQ  D:  01/15/2011  T:  01/15/2011  Job:  [id number]  cc:   Angelena Sole, MD  Electronically Signed by Charlott Rakes MD on 02/06/2011 09:40:22 AM

## 2011-03-05 NOTE — H&P (Signed)
NAME:  Blake Wilson, STUCKE NO.:  [id number]   MEDICAL RECORD NO.:  [id number]          PATIENT TYPE:  EMS   LOCATION:  MAJO                         FACILITY:  [facility]   PHYSICIAN:  Santiago Bumpers. Hensel, M.D.DATE OF BIRTH:  [birth year]   DATE OF ADMISSION:  06/24/2007  DATE OF DISCHARGE:                              HISTORY & PHYSICAL   CHIEF COMPLAINT:  Shortness of breath.   HISTORY OF PRESENT ILLNESS:  This is a 60 year old male with lupus who  has had progressive shortness of breath for the past couple of months.  Over this course of time, he has noticed himself having a hard time  breathing.  He cannot lie down flat and has not been able to do so for  some time.  He sleeps sitting in a chair.  This morning, his shortness  of breath was worse, and the patient states he had a hard time  breathing.  He was breathing fast.  He felt like he was having a heart  attack because he was breathing fast.  He denies chest pain and no chest  tightness.  He was diaphoretic at the time of his breathing fast.  He  was given BiPAP in the ambulance, which made him feel better.  He feels  much better now.  He endorses a dry cough over the past 2 months.  He  also endorses congestion relieved by nasal sprays.  He also says he has  had cramping of the chest and abdomen over the past 2 months; however,  no worse today.  Otherwise, review of systems of significance:  Denies  weight loss, denies night sweats.  Does complain of subjective fevers  off and on for the past 2 months, complains of edema of his lower  extremities.  Denies syncope, denies abdominal pain, denies weakness.  No diarrhea, no constipation.  He does complain of a fullness after  eating.   PAST MEDICAL HISTORY:  Lupus, and he was on chronic steroids until 1999.  He states his lupus was rash only.   SURGERIES:  None.   FAMILY HISTORY:  His father was obese, had an MI age 56.  No other heart  disease in the family.   SOCIAL HISTORY:  He is single.  No children.  Lives alone.  He denies  smoking.  He has 1 beer a day at the most.  Denies drugs.  Lives in  [place].  He was a remote smoker socially years ago.   PHYSICAL EXAM:  INITIAL SET OF VITALS:  Blood pressure 164/99, pulse  114, respiratory rate 22, temperature 98.3, sats were 100% on BiPAP.  NEXT SET OF VITALS:  Blood pressure 142/101, pulse 94, respiratory rate  22, sats are 98% on 40% of oxygen.  GENERAL EXAM:  Alert, well-developed, well-nourished, overweight.  He is  on BiPAP; however, not in any acute distress even when removed.  HEAD:  Normocephalic, no abnormalities.  EYES:  Vision grossly intact.  Pupils are equal, round, and reactive to  light and accommodation.  Extraocular muscles intact.  NOSE:  Dry nasal discharge present at both  nares.  MOUTH:  Moist mucous membranes.  No erythema.  NECK:  Supple.  Full range of movement.  No masses, no JVD.  LUNGS:  Normal respiratory effort.  No intercostal retractions.  No  accessory muscle use.  No crackles.  No wheezes.  There is fair air  movement but equal bilaterally.  HEART:  Tachycardia, regular rhythm, no rubs, gallops, or murmurs.  ABDOMEN:  Soft, nontender, tympanic bowel sounds, distended abdomen.  No  obvious ascites but difficult assessment.  RECTAL EXAM:  The patient has stool in undergarments and outside his  rectum.  He is fecal occult blood negative.  MUSCULOSKELETAL:  Normal range of movement.  No joint tenderness.  No  joint swelling.  PULSES:  Radial, posterior tibial, and dorsalis pedis, and carotid  pulses were normal bilaterally.  No bruits noted.  EXTREMITIES:  1+ pitting edema in the anterior shin bilaterally.  There  is faint erythema of the left shin.  There is moderate tenderness to  palpation of the left calf and mild tenderness to palpation of the right  calf.  Negative Homans sign.  Upper extremities within normal limits.  NEURO:  Alert and oriented x3.   Cranial nerves II-XII intact.  Strength  is normal in all extremities.  SKIN:  Turgor is normal.  Faint erythema in the shin, left lower  extremity.  CERVICAL NODES:  Normal.  PSYCHIATRIC:  Alert, oriented, cooperative.   LABORATORY:  Initial point of care showed an elevated CK-MB at 10.3,  otherwise within normal limits.  Second set showed an elevated CK-MB at  12.4, troponin of 0.06 at the time.  Myoglobin showed a normal set of  cardiac enzymes showed troponin elevated at 0.09.  Complete metabolic  panel:  Sodium 139, potassium 4.1, chloride 108, bicarbonate 24, BUN 18,  creatinine 0.95, glucose 165.  AST elevated at 99, ALT elevated at 90,  alk phos 89, bilirubin 0.7.  CK 339, CK-MB 11.2 and relative index is  3.3.  BNP 153, ABG 7.360/44.7/449/25.2.  Chest x-ray:  Bibasilar  airspace disease, left greater than right, worrisome for pneumonia.  No  definite pulmonary edema.  EKG shows a first-degree block with a PR  interval at 511, sinus tachycardia, PVCs, and Q-wavecomplexes , left  axis deviation, and no acute ST elevations or depressions, but there are  ST- and T-wave abnormalities.  Fecal occult blood negative, and LVH of  273, which is high.   ASSESSMENT AND PLAN:  This is a 60 year old male with shortness of  breath.  1. Shortness of breath:  Broad differential includes respiratory,      cardiac, infectious disease, and rheumatology etiology.  Per      respiratory:  He could have lupus, pneumonitis, pneumonia, pleural      edema, or pulmonary embolism.  We will get a CT angiogram of the      chest to differentiate the levels for possible pneumonia as well as      looking for pulmonary embolus.  The patient is at mild risk for      pulmonary embolism given lupus can cause a hypercoagulable state.      The patient is also with calf tenderness.  We will hold on      antibiotics until the CT results are done to help qualify the      pneumonia if it is present.  If pneumonia is  obvious, we will treat      for community-acquired pneumonia with Rocephin and azithromycin.  If the CT shows a pneumonitis-like picture, we can treat with a      steroid burst.  The patient does not require BiPAP now, but we can      get it on continuous positive airway pressure, we will BiPAP if      needed.  For now, nasal cannula O2 as needed.  Consider lower      extremity Dopplers regardless of CT result, as the patient may have      a deep vein thrombosis and needs treatment for that despite a      negative CT angio.  Cardiac etiology:  With elevated troponins,      myocardial infarction is a possibility.  We will cycle his enzymes.      We will repeat a electrocardiogram in the morning.  Congestive      heart failure is in the differential as well.  If enzymes continue      to increase, we will add on Lovenox versus heparin, to treat both      acute coronary syndrome and as well for possible deep vein      thrombosis or pulmonary embolus.  Check a TSH and routine labs.      Will follow fasting lipid panel in the morning.  Consider      pericarditis given lupus, and we will get a 2D echo.  Infectious      disease etiology:  Pneumonia is a possibility but not likely given      long course.  Follow CT results and add if necessary.      Rheumatology:  Could be a lupus flareup with pneumonitis, CT angio      work up with pneumonia.  Consider steroid burst.  Add on an      erythrocyte sedimentation rate and LDH.  We will need to set up      outpatient rheumatology at some point.  2. Lupus:  Hold off meds for now but continue steroid burst if this is      not a pneumonia.  3. Hypertension:  Hold off adding medications until the patient is      more comfortable.  Blood pressure has come down in the ED; however,      he was given morphine.  We will start a beta blocker for      tachycardia and myocardial infarction protection.  HCTZ is also a      good choice once myocardial  infarction has been ruled out.  We will      need outpatient followup.  4. Abnormal liver function tests:  No significant alcohol history      based on verbal history.  However, we will get a CT of the abdomen      and pelvis to further examine the area.  5. Leg edema:  Differential includes chronic changes, congestive heart      failure, deep vein thrombosis.  We will hold off on further Lasix      for now, as this may be a chronic problem that we can address as an      outpatient.  We will get CT angiogram of the chest.  Consider lower      extremity Dopplers.      Johney Maine, M.D.  Electronically Signed      Santiago Bumpers. Leveda Anna, M.D.  Electronically Signed    JT/MEDQ  D:  06/24/2007  T:  06/24/2007  Job:  [id number]

## 2011-03-05 NOTE — Cardiovascular Report (Signed)
NAME:  Blake Wilson, OKAZAKI NO.:  [id number]   MEDICAL RECORD NO.:  [id number]          PATIENT TYPE:  INP   LOCATION:  3705                         FACILITY:  [facility]   PHYSICIAN:  Noralyn Pick. Eden Emms, MD, FACCDATE OF BIRTH:  05-08-1951   DATE OF PROCEDURE:  DATE OF DISCHARGE:                            CARDIAC CATHETERIZATION   Coronary arteriography.   INDICATIONS:  The patient is a 60 year old patient with cardiomyopathy,  question ischemic.  Right heart catheterization was done to assess  filling pressures given the patient's congestive heart failure and  dyspnea.   The same catheterization was done with a 5-French catheter from the  right femoral artery and a 7-French catheter from the right femoral  vein.   The left main coronary artery had a distal 30-40% eccentric stenosis.   The left anterior descending artery was somewhat tortuous in its  proximal and mid vessel.  There were 40% tubular lesion in the proximal  and mid vessel.  Distal vessel was normal.  There was a very large  intermediate branch with 30-40% tubular disease in the mid vessel.  First diagonal branch had 30% multiple discrete lesions at the ostia.  Circumflex coronary artery was nondominant.  There 30% tubular disease  in the mid vessel.   Right coronary artery was dominant.  There was 30% tubular disease in  the mid vessel.   Ventriculography:  Our ventriculography showed moderate left ventricular  cavity enlargement with global hypokinesis.  The EF was in the 35%  range.  Interestingly, there was only mild MR.  The patient's MR seemed  a lot worse by echo.   Right heart catheterization showed a mean right atrial pressure of 11,  RV pressure of 38/12, PA pressure of 34/20, mean pulmonary capillary  wedge pressure 24, LV pressure is 120/22, aortic pressure is 121/72.   IMPRESSION:  The patient does not have critical coronary disease.  He  has basically a nonischemic cardiomyopathy.   Continue medical therapy as  warranted and as long as his leg heals well, he could probably be  discharged by the primary service in the morning.      Noralyn Pick. Eden Emms, MD, [facility]  Electronically Signed     PCN/MEDQ  D:  06/26/2007  T:  06/26/2007  Job:  [id number]

## 2011-03-05 NOTE — Assessment & Plan Note (Signed)
[facility]                            CARDIOLOGY OFFICE NOTE   NAME:Blake Wilson, Blake Wilson                   MRN:          [id number]  DATE:11/04/2007                            DOB:          04/13/1951    Blake Wilson returns today for followup.  He has had a nonischemic  cardiomyopathy with an EF of 20% range.  He is currently functional  Class I.  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  He just came over from [facility] where he had  lab work done.   He was cathed in September 2008.  He had normal coronary arteries with  mild MR, PA pressures were 38/12.   His blood pressure continues to be a little bit high.  I told him we  would up titrate his lisinopril today from 20 to 40.   He thinks his lower extremity edema is improved.  We had increased his  Lasix prior to his heart cath and his filling pressures were better.  He  is not having chest pain, PND, orthopnea.  He does get mild exertional  dyspnea.   There has been no syncope or palpitations, he has been compliant with  his meds.  He continues to have some salt in his diet.   REVIEW OF SYSTEMS:  Otherwise negative.   He is on:  1. Lasix 40 b.i.d.  2. Potassium 20 a day.  3. Lopressor 25 b.i.d.  4. Simvastatin 40 a day.  5. Lisinopril 40 a day.  6. An aspirin a day.   HE HAS NO KNOWN ALLERGIES.  I called his prescriptions into the [facility]  [place] on [address].   EXAMINATION:  Remarkable for an overweight black male in no distress.  Affect is appropriate, weight is 246, blood pressure is 139/83, pulse 71  and regular, afebrile, respiratory rate 14.  HEENT:  Unremarkable.  Carotids are normal without bruit, no  lymphadenopathy, no thyromegaly, no JVP elevation.  LUNGS:  Clear, good diaphragmatic motion, no wheezing.  S1-S2, distant heart sounds, PMI not palpable.  ABDOMEN:  Benign, bowel sounds positive, no AAA, no  hepatosplenomegaly,  no hepatojugular reflux, no bruit.  Distal pulses were intact with +1 edema bilaterally.  He has multiple  old scars on his left upper extremity.   His EKG is essentially normal with minimal voltage criteria for LVH.   IMPRESSION:  1. Nonischemic cardiomyopathy, currently functional Class I.  Appears      euvolemic, check lab work drawn at [facility] today.  2. Hypertension, suboptimally controlled in the setting of      cardiomyopathy.  Increase lisinopril from 20 to 40.  3. Hyperlipidemia, no evidence of coronary disease.  Continue low dose      simvastatin 40 a day.  Lipids were checked today at [facility].  Check liver.  4. Lower extremity edema improved.  Continue low-salt diet and current      dose of Lasix   I will see Blake Wilson back in 6 months.  Noralyn Pick. Eden Emms, MD, [facility]  Electronically Signed    PCN/MedQ  DD: 11/04/2007  DT: 11/04/2007  Job #: [id number]

## 2011-03-05 NOTE — Consult Note (Signed)
NAME:  Blake Wilson, DOYON NO.:  [id number]   MEDICAL RECORD NO.:  [id number]          PATIENT TYPE:  INP   LOCATION:  3705                         FACILITY:  [facility]   PHYSICIAN:  Noralyn Pick. Eden Emms, MD, FACCDATE OF BIRTH:  [birth year]   DATE OF CONSULTATION:  06/25/2007  DATE OF DISCHARGE:                                 CONSULTATION   HISTORY:  [name] was admitted to [facility] by the family  practice service on 06/24/2007.  He has been having increasing shortness  of breath.  We are asked to help evaluate this in regard to possible  unstable anginal syndrome or heart failure.   Mr. Racca has never had a previous heart workup.  In talking to him,  he has been increasingly short of breath over the last 3-4 years.  He  works as an Orthoptist and finds that is has been extremely difficult  to go up and down stairs.  He got acutely worse over the last few weeks.  He had had significant cough.  There was no significant sputum  production.  He has classic PND and orthopnea and has been having  increasing lower extremity edema.   The patient has a history of SLE.  He has a history of adenopathy and  has been on steroids.  I do not know how active this disease is or what  his baseline lung function is.   The patient does not have a primary care doctor.   He has had fairly poor followup.  His coronary risk factors include  previous tobacco.  He quit many years ago.  Father had an MI in his 23s.  He is a non-diabetic.  There is no history of taking blood pressure  medications.   The patient was treated fairly broadly on admission with antibiotics and  diuretics.  He has had improvement, however over the course of the  evening he had recurrent bouts of fairly acute dyspnea.   In talking to the patient, he currently feels better on oxygen.  His  cough is improved.  There is no pleuritic pain.  There is no history of  PE or lower extremity DVT.   He has not  really had any significant chest pain.  There has been no  previous history of MI.  His chest sometimes does get tight when he is  short of breath but I suspect this is more from his dyspnea.   He has never had previous stress test.   REVIEW OF SYSTEMS:  Otherwise negative.   PAST MEDICAL HISTORY:  Fairly sketchy.  There is a question of SLE with  history of steroid use since 1999.  He apparently has had mediastinal  adenopathy and a rash only.   PAST SURGICAL HISTORY:  None.   FAMILY HISTORY:  As described, with premature coronary disease in his  father's side.   MEDICATIONS:  He does not take any medications on a regular basis.   SOCIAL HISTORY:  He is single.  He has relatives in [place] but is  originally from [place].  He works as an  exterminator.  He was  inquiring about going back on Disability, which he was on before for his  lupus.  He lives alone.  He does not smoke or drink.   Patient does not have a primary care M.D. and will likely see the family  practice service on discharge.   ALLERGIES:  No known allergies.   PHYSICAL EXAMINATION:  GENERAL:  His exam is remarkable for a tall  middle-aged black male in no distress.  MENTAL STATUS:  Affect is appropriate.  VITAL SIGNS:  Weight is not listed in the chart.  Afebrile, temp is  98.1.  Pulse is 83 and regular.  Respirations are 18-20, nonlabored.  Blood pressure is 135/87.  Sats are 99% on 2 L.  HEENT:  Normal.  NECK:  JVP is not visible.  There is no lymphadenopathy, no thyromegaly,  no JVP elevation.  LUNGS:  Currently clear with minor rales at the base.  HEART:  There is an S1 and S2 with an MR murmur.  PMI is increased and  laterally displaced.  ABDOMEN:  Bowel sounds are positive.  No AAA.  No hepatosplenomegaly.  No hepatojugular reflux.  No tenderness.  No bruits.  VASCULAR:  Femorals are +3 bilaterally without bruit.  PTs are +2.  There is +1 to 2 lower extremity edema bilaterally.  NEURO:   Nonfocal.  LYMPH NODES:  There is no lymphadenopathy.  SKIN:  Warm and dry.  MUSCULOSKELETAL:  There is no muscular weakness.   DIAGNOSTIC AND LABORATORY DATA:  EKG shows sinus rhythm with somewhat  low atrial focus and LVH.  There is no previous MI.  Chest x-ray showed  probable pulmonary edema although there is a question of a left lower  lobe pneumonia.   His lab work is remarkable for BNP of 153. BMET showed a potassium of  3.7, BUN 12, creatinine 0.9.  Cardiac markers were mildly elevated at  766 but the MB was only 6.8 with a relative index of 0.9.  Troponins  were 0.13.  Overall these would appear to be noncoronary or cardiac in  etiology.  His hematocrit is 38.8, white count was 5.6, platelet count  is 226.   IMPRESSION:  1. Dyspnea.  Likely from nonischemic cardiomyopathy.  I reviewed his 2-      dimensional echocardiogram.  He has global hypokinesis with an      ejection fraction of 25% to 30% with moderate to severe mitral      regurgitation.  Given the fact that he has never had a history of a      myocardial infarction and his electrocardiogram does not show this,      this is likely a nonischemic cardiomyopathy.  He will be referred      for right and left heart catheterization.  He will be started on      twice daily Lasix as well as an angiotensin-converting enzyme      inhibitor.  We will try to titrate his medications and add Coreg at      a later date.  2. Mitral insufficiency.  Not sure how much of this is from annual      dilatation versus possible lupus valvulopathy.  Unfortunately his      left ventricular function is already quite low.  We will have to      see how he responds to medicine.  Right and left heart      catheterization will help [place] further assess the degree of  mitral      regurgitation in regard to a V wave and angiographic grade.  I am      not sure why his B-type natriuretic peptide was only 152, as his      presentation is clearly consistent  with pulmonary edema and      congestive failure.  He will be placed on twice daily Lasix and      followed.  3. Question hypertension, at least left ventricular hypertrophy on      electrocardiogram.  The patient will be started on an angiotensin-      converting enzyme inhibitor due to his decreased left ventricular      function.  We will follow this.  Target blood pressure will be 110-      120.  4. History of lupus.  Patient will have this further worked up by the      primary care doctors.  I am not sure if he needs to be on chronic      steroids at this time.  His chest CT did show some mediastinal      adenopathy but it was not all that bulky.  His sedimentation rate      currently is only 22.  Further care of his lupus will be given by      the primary care doctors.   Further recommendations will be based on the results of his right and  left heart catheterization.  I do not think he has had an acute coronary  syndrome.  This is much more likely to be a nonischemic cardiomyopathy.      Noralyn Pick. Eden Emms, MD, [facility]  Electronically Signed     PCN/MEDQ  D:  06/25/2007  T:  06/25/2007  Job:  [id number]

## 2011-03-05 NOTE — Discharge Summary (Signed)
NAME:  SHAYMUS, EVELETH              ACCOUNT NO.:  [id number]   MEDICAL RECORD NO.:  [id number]          PATIENT TYPE:  INP   LOCATION:  3705                         FACILITY:  [facility]   PHYSICIAN:  Wilhemina Bonito, M.D.     DATE OF BIRTH:  1951-10-07   DATE OF ADMISSION:  06/24/2007  DATE OF DISCHARGE:  06/27/2007                               DISCHARGE SUMMARY   CONSULTANTS:  [facility].   PROCEDURES:  1. CT scan of the chest and abdomen.  CT scan of the chest found      bilateral hilar lymphadenopathy, some mediastinal lymphadenopathy,      small bilateral pleural effusion, possible pulmonary edema, and a      right middle lobe nodule.  CT scan of the abdomen found bilateral      avascular necrosis of the femoral head.  2. 2-D echocardiogram showed dilation of the left ventricle and      decreased systolic function.  Left ventricular ejection fraction      was estimated at 20%.  There was severe diffuse left ventricular      hypokinesis.  There was also moderate to severe mitral valve      regurgitation.  The right and left atria were both dilated.  3. Venous Dopplers of lower extremity found no evidence of DVT or      superficial thrombosis.  4. Cardiac catheterization.  Catheterization found 30% stenosis of the      right coronary artery, 40% stenosis of the left common coronary      artery, 40% stenosis of the LAD, and 30% stenosis of the circumflex      artery.  There was evidence of mitral regurgitation.  The ejection      fraction was found to be 35%.   DIAGNOSES:  1. Acute-on-chronic systolic congestive heart failure.  2. Coronary artery disease.  3. Hypertension.  4. Hyperlipidemia.  5. Obesity.  6. Systemic lupus erythematosus.   NEW DISCHARGE MEDICATIONS:  1. Lasix 40 mg once daily.  2. Toprol-XL 25 mg once daily.  3. Aspirin 81 mg once daily.  4. Lisinopril 20 mg once daily.  5. Simvastatin 40 mg once daily.   FOLLOWUP ISSUES FOR PCP:  1. Serum  angiotensin-converting enzyme pending lab.  2. Final blood cultures.   BRIEF HPI:  [name] is a 60 year old man with a history of lupus who  had been lost to followup for several years who presented after  approximately a month of shortness of breath that had become acutely  worse over the last several days.  Please see the dictated H&P for  further details.   HOSPITAL COURSE:  1. Shortness of breath.  On presentation, CT scan and chest x-ray were      concerning for possible heart failure versus pneumonia versus a      different pulmonary process.  The acute onset of the shortness of      breath was also concerning for possible pulmonary embolism.  On      admission, BNP was 153, so CHF was not immediately at the top of  the differential diagnosis.  Pulmonary embolism was eventually      ruled out by CT scan as well as negative venous Dopplers.  The      patient remained afebrile and with a normal white blood cell count,      so pneumonia seemed unlikely.  Cardiac enzymes were elevated with      the troponin increasing from 0.09 to 0.23 and then 0.13.  For this      reason, acute coronary syndrome was suspected.  A 2-D echo was      performed; see above for the results.  The ejection fraction was      found to be approximately 20%.  Because of the low ejection      fraction and the persistently elevated cardiac enzymes, cardiology      was consulted, and they advised cardiac catheterization; see the      above results.  Based on those results, it was determined that the      cause of Mr. Grosso shortness of breath was likely chronic      systolic heart failure with an acute exacerbation.  He was started      on Lasix 40 mg daily, and his symptoms improved.  2. Coronary artery disease; see the above cardiac cath results.  Mr.      Mckiver was started on 81 mg of aspirin for anticoagulation and      simvastatin 40 mg for cholesterol control.  Fasting lipid panel      showed  a total cholesterol of 207, triglycerides of 100, HDL of 35,      and LDL of 152.  3. Hypertension.  The patient was started on metoprolol 25 mg daily as      well as lisinopril 20 mg daily for control of his hypertension.  4. Possible pneumonia with positive blood cultures.  The CT scan of      the chest was initially read as possible pneumonia, and the patient      was started on IV Rocephin and azithromycin.  One out of two blood      cultures grew gram-positive cocci in clusters.  The culture was      eventually speciated to coag-negative staph which was determined to      be a contaminate.  This information as well as the fact that the      patient remained afebrile and with a normal white blood cell count,      the antibiotics were discontinued before discharge.   ADMISSION LABS:  On admission, Mr. Hessel hemoglobin was 14.6,  hematocrit was 42.9, white blood cell count was 8.1, and platelet count  was 131.  On admission, his sodium was 139, potassium was 4.1,  creatinine was 0.95.   DISCHARGE LABS:  On September 4, Mr. Quant hemoglobin was 13.0,  hematocrit was 38.3, and on September 5 his PT was 13.6, PTT was 44,  sodium was 138, potassium was 3.8, creatinine was 0.86.   CONDITION AT DISCHARGE:  Stable.   PENDING TESTS:  Serum ACE and final blood cultures pending.   FOLLOWUP APPOINTMENTS:  There is an appointment with Dr. Sandria Manly at the  [facility] on September 11 at 2:45.   DISPOSITION:  Home with self care.     ______________________________  Stevphen Rochester    ______________________________  Wilhemina Bonito, M.D.    KD/MEDQ  D:  06/26/2007  T:  06/27/2007  Job:  [id number]

## 2011-03-05 NOTE — Assessment & Plan Note (Signed)
[facility]                            CARDIOLOGY OFFICE NOTE   NAME:Muhlenkamp, YAZAN GATLING                   MRN:          [id number]  DATE:08/12/2007                            DOB:          04/13/1951    The patient is seen today in follow-up.  I saw him initially for  cardiomyopathy in the hospital.  His heart catheterization did not show  critical coronary artery disease.  This was done on June 26, 2007.  His EF was 35% with global hypokinesis consistent with nonischemic  cardiomyopathy.  At the time of his catheterization, his PA pressure was  34/20 with a mean wedge pressure of 24.  In talking to the patient, he  has not had marked fatigue.  There has been no PND or orthopnea.  He  does get lower extremity edema that worsens as the day goes on.   REVIEW OF SYSTEMS:  Otherwise negative with no palpitations or syncope.   He has been compliant with his medicines.   MEDICATIONS:  1. Lasix 40 mg a day.  2. Metoprolol 25 mg a day.  3. Simvastatin 40 mg a day.  4. Lisinopril 20 mg a day.  5. Aspirin a day.   PHYSICAL EXAMINATION:  VITAL SIGNS:  Weight 242, blood pressure 122/70,  pulse 59 and regular, respiratory rate 14, afebrile.  HEENT:  Normal, carotids are normal.  I cannot see his JVP.  There is no  bruit, no lymphadenopathy, and no thyromegaly.  LUNGS:  Clear with good diaphragmatic motion, no wheezing.  HEART:  S1 and S2 distant heart sounds.  PMI is not palpable.  ABDOMEN:  Bowel sounds are positive with no AAA, no tenderness, no  hepatosplenomegaly, or hepatojugular reflux.  EXTREMITIES:  Distal pulses are intact.  He has +1 to 2 edema  bilaterally, left slightly greater than right.  NEUROLOGY:  Nonfocal with no muscular weakness.  SKIN:  Warm and dry.   EKG showed sinus rhythm with chronic T wave inversion in lead 3 with  borderline voltage criteria for LVH.   IMPRESSION:  1. Nonischemic cardiomyopathy currently stable.  Check  BMET and BNP.      Continue current dose of lisinopril and beta blocker.  2. Hypertension, currently well controlled despite left ventricular      hypertrophy on EKG.  The patient will try to home monitor his blood      pressure 2-3 times a week to make sure we are not missing spikes.      Continue low salt diet.  3. Lower extremity edema suboptimally controlled.  I will be curious      to see if his BNP is elevated.  I will call in prescriptions for      Lasix 40 mg b.i.d. and K-Dur 20 mg a day to the [facility] on [address].  I will see him back in three months to reassess this.  We      may change our plans based on his electrolytes and BNP, but he      would appear to need slightly more Lasix  in regards to his      dependent edema.     Noralyn Pick. Eden Emms, MD, [facility]  Electronically Signed    PCN/MedQ  DD: 08/12/2007  DT: 08/13/2007  Job #: [id number]

## 2011-04-30 ENCOUNTER — Encounter: Payer: Self-pay | Admitting: Dietician

## 2011-04-30 ENCOUNTER — Encounter: Payer: Medicare HMO | Attending: Family Medicine | Admitting: Dietician

## 2011-04-30 VITALS — Ht 72.0 in | Wt 216.9 lb

## 2011-04-30 DIAGNOSIS — E119 Type 2 diabetes mellitus without complications: Secondary | ICD-10-CM

## 2011-04-30 DIAGNOSIS — Z713 Dietary counseling and surveillance: Secondary | ICD-10-CM | POA: Insufficient documentation

## 2011-04-30 NOTE — Patient Instructions (Addendum)
-  Check feet each day when putting on socks and shoes.  Make sure no new blisters, open spots, or excessive dryness.  If dry, use a lotion with lanolin. -See Dr. Lelon Perla for the [facility] (80-month average of blood glucose) -Use diet orangeade, diet orange juice. Eat fruit rather than the juice.Can't use fresh; use no sugar added frozen or canned (packed in juice then pour off the juice and just eat the fruit. - Try out the [employer] (Silver [employer]) -Get your colonoscopy done.  - If having increased arthritis pains, follow-up with your doctor. -Try using the alternate for glucose testing.  Remember to wash the site (Forearm) with soap and water, rinse.  Wash your hands with soap and water.  Use the highest number on the lancing device.  Rub the area until the arm and hand are warm to touch.  Then lance the site and rock the lancing device to milk a drop.  Then proceed with testing. -Consider rotating the sites on your fingers and using the warm soap and water.   -See Seward Grater back in 3 months (Oct. 2012).  Call for follow-up [phone].

## 2011-04-30 NOTE — Progress Notes (Signed)
Medical Nutrition Therapy:  Appt start time: 1100 end time:  1200.  Assessment:  Primary concerns today: Blood sugar control.  Usual eating pattern includes 3 meals and 0-1 snacks per day.  Everyday foods include oatmeal, cereal, boiled eggs.  Avoided foods include none.  24-hr recall: B (7 AM)- Oatmeal 1/2 cup from box with dash sugar (1 tsp.), water or juice 8 oz; L (12 PM)- sandwich (balona or ham or [place], uses white bread, mayo, Oakleigh Hesketh have chips if available, water; D (5 PM)- Oodles of noodles, cuke, tomatoes, [place] dressing (Cold salad) 1 cup, orangeade 8 oz (regular); Snk (9 PM)-PB sandwich.  Appetite changed, nothing tastes right.  I get full fast.  Has lost 23.7# over 6 months.  Gives history for colonoscopy with 2-3 polyps.  Due back on 05/07/2011.  Appetite started decreasing in Jan. or Feb. of 2012. Usual physical activity includes Walks 20-25 minutes daily.  Has Silver Sneakers with [employer].  Try out the [facility].  FBG has been running high-80s to 138.  Evenings run from high-60s to mid-110s.    Progress Towards Goal(s):  In progress.   Nutritional Diagnosis:  [place]-2.1 Inpaired nutrition utilization As related to diagnosis of diabetes type 2.  As evidenced by previous HgA1C of 6.3 %.    Intervention:  Nutrition Education.  Monitoring/Evaluation:  Dietary intake, exercise, BG, and body weight in 3 month(s).

## 2011-05-07 ENCOUNTER — Ambulatory Visit (HOSPITAL_COMMUNITY)
Admission: RE | Admit: 2011-05-07 | Discharge: 2011-05-07 | Disposition: A | Discharge status: Home / Self Care | Payer: Medicare HMO | Source: Ambulatory Visit | Attending: Gastroenterology | Admitting: Gastroenterology

## 2011-05-07 DIAGNOSIS — I509 Heart failure, unspecified: Secondary | ICD-10-CM | POA: Insufficient documentation

## 2011-05-07 DIAGNOSIS — K648 Other hemorrhoids: Secondary | ICD-10-CM | POA: Insufficient documentation

## 2011-05-07 DIAGNOSIS — E119 Type 2 diabetes mellitus without complications: Secondary | ICD-10-CM | POA: Insufficient documentation

## 2011-05-07 DIAGNOSIS — M329 Systemic lupus erythematosus, unspecified: Secondary | ICD-10-CM | POA: Insufficient documentation

## 2011-05-07 DIAGNOSIS — I10 Essential (primary) hypertension: Secondary | ICD-10-CM | POA: Insufficient documentation

## 2011-05-07 DIAGNOSIS — E669 Obesity, unspecified: Secondary | ICD-10-CM | POA: Insufficient documentation

## 2011-05-07 DIAGNOSIS — E785 Hyperlipidemia, unspecified: Secondary | ICD-10-CM | POA: Insufficient documentation

## 2011-05-07 DIAGNOSIS — Z79899 Other long term (current) drug therapy: Secondary | ICD-10-CM | POA: Insufficient documentation

## 2011-05-07 DIAGNOSIS — I739 Peripheral vascular disease, unspecified: Secondary | ICD-10-CM | POA: Insufficient documentation

## 2011-05-07 DIAGNOSIS — Z7982 Long term (current) use of aspirin: Secondary | ICD-10-CM | POA: Insufficient documentation

## 2011-05-07 DIAGNOSIS — Z01812 Encounter for preprocedural laboratory examination: Secondary | ICD-10-CM | POA: Insufficient documentation

## 2011-05-07 DIAGNOSIS — I2510 Atherosclerotic heart disease of native coronary artery without angina pectoris: Secondary | ICD-10-CM | POA: Insufficient documentation

## 2011-05-23 NOTE — Op Note (Signed)
  NAME:  Blake Wilson, Blake Wilson NO.:  [id number]  MEDICAL RECORD NO.:  [id number]  LOCATION:  WLEN                         FACILITY:  [facility]  PHYSICIAN:  Shirley Friar, MDDATE OF BIRTH:  12/24/1950  DATE OF PROCEDURE: DATE OF DISCHARGE:                              OPERATIVE REPORT   PROCEDURE:  Sigmoidoscopy.  INDICATIONS:  History of polyps.  Need to re-evaluate polypectomy site.  MEDICATIONS:  Fentanyl 50 mcg IV, Versed 5 mg IV.  FINDINGS:  Rectal exam was unremarkable.  A pediatric colonoscope was inserted into a fair prepped colon and advanced to the splenic flexure. On careful withdrawal of the colonoscope, the previously tattooed polypectomy site was noted in the distal sigmoid colon and there was no residual polyp noted.  Retroflexion revealed small internal hemorrhoids.  ASSESSMENT: 1. No residual polyp at tattooed site in distal sigmoid colon. 2. Small internal hemorrhoids. 3. Repeat a colonoscopy in 2 years from the previous one as previously     planned.     Shirley Friar, MD     VCS/MEDQ  D:  05/07/2011  T:  05/07/2011  Job:  [id number]  cc:   Angelena Sole, MD  Electronically Signed by Charlott Rakes MD on 05/23/2011 12:56:35 PM

## 2011-06-12 ENCOUNTER — Encounter: Payer: Self-pay | Admitting: Family Medicine

## 2011-06-12 DIAGNOSIS — E119 Type 2 diabetes mellitus without complications: Secondary | ICD-10-CM

## 2011-06-12 NOTE — Assessment & Plan Note (Signed)
Documentation only. No diabetic retinopathy (05/28/2011) per Dr. Dione Booze.

## 2011-07-24 ENCOUNTER — Other Ambulatory Visit (HOSPITAL_COMMUNITY): Payer: Medicare HMO | Admitting: Radiology

## 2011-07-24 ENCOUNTER — Ambulatory Visit: Payer: Medicare HMO | Admitting: Cardiovascular Disease

## 2011-07-31 ENCOUNTER — Encounter: Payer: Medicare HMO | Attending: Family Medicine | Admitting: Dietician

## 2011-07-31 ENCOUNTER — Encounter: Payer: Self-pay | Admitting: Dietician

## 2011-07-31 DIAGNOSIS — E119 Type 2 diabetes mellitus without complications: Secondary | ICD-10-CM | POA: Insufficient documentation

## 2011-07-31 DIAGNOSIS — Z713 Dietary counseling and surveillance: Secondary | ICD-10-CM | POA: Insufficient documentation

## 2011-07-31 NOTE — Patient Instructions (Addendum)
   Check your feet daily.  If dry, get a pumice stone ([employer]). Damp stone and damp foot and use it to remove a small amount of dry skin at a time.  Continue to check blood glucose twice daily.  If you have a low reading and no symptoms, then recheck your level.  Call [facility] and make an appointment.    Get to the [facility] for the [employer].  Continue to monitor portions.  Call me and let me know the name of the doctor that you see for the research study.  Maggie :  [phone] (leave me a message).

## 2011-07-31 NOTE — Progress Notes (Signed)
  Medical Nutrition Therapy:  Appt start time: 0900 end time:  0930.   Assessment:  Primary concerns today: Blood glucose control and weight status.  Has not seen family practice MD since January 2012.  His physician left and he is about having to get a new MD.  Encouraged him to do so soon.  He has lost 12.7 lb since his last visit in July.  He attributes his success to using a portioning plate.   Continues in a research study for his gout.  Taking 2 medications of which he does not know the name.   He continues to have issues with decreased taste.  "Food don't taste right"  He attributes this to the use of his dentures and perhaps the gout medication that he is taking.     MEDICATIONS: See list.  He currently is taking Metformin 1000 mg with the evening meal.  He is not taking the AM dose.  Blood Glucose:  Checking blood glucose 2 times per day; fasting and 2 hours after the evening meal.  Fasting:119, 93, 121, 116, 105, 98, 94, 111.  Evening: 134, 107, 111, 140, 110, 126, 209, 169,    DIETARY INTAKE:  Usual eating pattern includes 3 meals and 2 snacks per day.   24-hr recall:  B (7-8:00 AM): Sausage 2 patties and 1 bread (white)  Sunday:  2 eggs over easy, 2-3 bacon strips Parke Jandreau grits, or oatmeal and coffee.  Using creamora in the coffee and diet sugar.  Splenda, he likes.  Snk ( AM): None  L ( 11:30-12:00PM): TV dinner, water is beverage of choice Snk ( PM): popcorn or chips D 5:00( PM): Left-overs : beef stew with potatoes, carrots, water chestnuts, broccoli and beans (white)  Snk (8:00 PM): PB crackers, PB sandwich or popcorn  Beverages: Water, coffee, milk, (whole) 1-2 glasses per week.  Usual physical activity: Doing some walking in the neighborhood when the weather is good.  Progress Towards Goal(s):  In progress.   Nutritional Diagnosis:  [place]-2.1 Inpaired nutrition utilization As related to glucose.  As evidenced by HgA1C of 6.3% and diagnosis of type 2 diabetes.      Intervention:  Nutrition Encouraged to continue to limit portions, to read labels and moderate carb intake..  Handouts given during visit include:  Living Well with diabetes  Monitoring/Evaluation:  Dietary intake, exercise, blood glucose levels, and body weight in 3 months.Marland Kitchen

## 2011-08-02 LAB — DIFFERENTIAL
Basophils Absolute: 0
Eosinophils Relative: 1
Lymphocytes Relative: 9 — ABNORMAL LOW
Lymphs Abs: 0.7
Monocytes Absolute: 0.4
Monocytes Relative: 4

## 2011-08-02 LAB — COMPREHENSIVE METABOLIC PANEL
AST: 99 — ABNORMAL HIGH
Albumin: 3.4 — ABNORMAL LOW
Chloride: 108
Creatinine, Ser: 0.95
GFR calc Af Amer: >60
Total Bilirubin: 0.7

## 2011-08-02 LAB — LIPID PANEL
Cholesterol: 207 — ABNORMAL HIGH
LDL Cholesterol: 152 — ABNORMAL HIGH
Total CHOL/HDL Ratio: 5.9
Triglycerides: 100

## 2011-08-02 LAB — B-NATRIURETIC PEPTIDE (CONVERTED LAB)
Pro B Natriuretic peptide (BNP): 153 — ABNORMAL HIGH
Pro B Natriuretic peptide (BNP): 50

## 2011-08-02 LAB — BASIC METABOLIC PANEL
BUN: 13
CO2: 28
Chloride: 100
Chloride: 101
Chloride: 104
Creatinine, Ser: 0.94
GFR calc Af Amer: >60
GFR calc Af Amer: >60
Glucose, Bld: 133 — ABNORMAL HIGH
Potassium: 3.8
Sodium: 135
Sodium: 138

## 2011-08-02 LAB — CBC
HCT: 38.3 — ABNORMAL LOW
Hemoglobin: 13
MCHC: 34
MCV: 89.8
Platelets: 231
RDW: 15 — ABNORMAL HIGH
WBC: 8.1

## 2011-08-02 LAB — CULTURE, BLOOD (ROUTINE X 2): Culture: NO GROWTH

## 2011-08-02 LAB — POCT I-STAT 3, ART BLOOD GAS (G3+)
Bicarbonate: 25.2 — ABNORMAL HIGH
Bicarbonate: 26.9 — ABNORMAL HIGH
Operator id: 256671
Operator id: 283371
TCO2: 27
pCO2 arterial: 39.7
pH, Arterial: 7.36
pH, Arterial: 7.44
pO2, Arterial: 429 — ABNORMAL HIGH
pO2, Arterial: 70 — ABNORMAL LOW

## 2011-08-02 LAB — URINALYSIS, ROUTINE W REFLEX MICROSCOPIC
Bilirubin Urine: NEGATIVE
Glucose, UA: NEGATIVE
Ketones, ur: NEGATIVE
Nitrite: NEGATIVE
Specific Gravity, Urine: 1.012
pH: 5

## 2011-08-02 LAB — POCT CARDIAC MARKERS
CKMB, poc: 10.3
CKMB, poc: 12.3
Myoglobin, poc: 176
Myoglobin, poc: 182
Operator id: 284251
Operator id: 284251
Troponin i, poc: <0.05

## 2011-08-02 LAB — URINE MICROSCOPIC-ADD ON

## 2011-08-02 LAB — APTT: aPTT: 44 — ABNORMAL HIGH

## 2011-08-02 LAB — PROTIME-INR
INR: 1
Prothrombin Time: 13.6

## 2011-08-02 LAB — CARDIAC PANEL(CRET KIN+CKTOT+MB+TROPI)
Relative Index: 1
Total CK: 766 — ABNORMAL HIGH
Troponin I: 0.23 — ABNORMAL HIGH

## 2011-08-02 LAB — TROPONIN I: Troponin I: 0.09 — ABNORMAL HIGH

## 2011-08-02 LAB — POCT I-STAT 3, VENOUS BLOOD GAS (G3P V)
Acid-Base Excess: 3 — ABNORMAL HIGH
Bicarbonate: 28.2 — ABNORMAL HIGH
O2 Saturation: 65
Operator id: 256671
TCO2: 30
pCO2, Ven: 44.8 — ABNORMAL LOW
pH, Ven: 7.406 — ABNORMAL HIGH
pO2, Ven: 34

## 2011-08-02 LAB — URINE CULTURE

## 2011-08-02 LAB — SEDIMENTATION RATE: Sed Rate: 22 — ABNORMAL HIGH

## 2011-08-27 ENCOUNTER — Other Ambulatory Visit (HOSPITAL_COMMUNITY): Payer: Self-pay | Admitting: Cardiovascular Disease

## 2011-08-27 DIAGNOSIS — I428 Other cardiomyopathies: Secondary | ICD-10-CM

## 2011-08-28 ENCOUNTER — Other Ambulatory Visit: Payer: Self-pay | Admitting: Family Medicine

## 2011-08-28 NOTE — Telephone Encounter (Signed)
Refill request

## 2011-08-29 ENCOUNTER — Ambulatory Visit: Payer: Medicare HMO | Admitting: Cardiovascular Disease

## 2011-08-29 ENCOUNTER — Other Ambulatory Visit (HOSPITAL_COMMUNITY): Payer: Medicare HMO | Admitting: Radiology

## 2011-09-06 ENCOUNTER — Other Ambulatory Visit: Payer: Self-pay | Admitting: Family Medicine

## 2011-09-06 MED ORDER — ATORVASTATIN CALCIUM 80 MG PO TABS: 80.0000 mg | ORAL_TABLET | Freq: Every day | ORAL | Status: DC

## 2011-09-26 ENCOUNTER — Encounter: Payer: Self-pay | Admitting: Cardiovascular Disease

## 2011-09-26 ENCOUNTER — Ambulatory Visit (INDEPENDENT_AMBULATORY_CARE_PROVIDER_SITE_OTHER): Payer: Medicare HMO | Admitting: Cardiovascular Disease

## 2011-09-26 ENCOUNTER — Ambulatory Visit (HOSPITAL_COMMUNITY): Payer: Medicare HMO | Attending: Cardiovascular Disease | Admitting: Radiology

## 2011-09-26 DIAGNOSIS — I428 Other cardiomyopathies: Secondary | ICD-10-CM

## 2011-09-26 DIAGNOSIS — E669 Obesity, unspecified: Secondary | ICD-10-CM | POA: Insufficient documentation

## 2011-09-26 DIAGNOSIS — E119 Type 2 diabetes mellitus without complications: Secondary | ICD-10-CM | POA: Insufficient documentation

## 2011-09-26 DIAGNOSIS — E785 Hyperlipidemia, unspecified: Secondary | ICD-10-CM | POA: Insufficient documentation

## 2011-09-26 DIAGNOSIS — I379 Nonrheumatic pulmonary valve disorder, unspecified: Secondary | ICD-10-CM | POA: Insufficient documentation

## 2011-09-26 DIAGNOSIS — I2510 Atherosclerotic heart disease of native coronary artery without angina pectoris: Secondary | ICD-10-CM | POA: Insufficient documentation

## 2011-09-26 DIAGNOSIS — I10 Essential (primary) hypertension: Secondary | ICD-10-CM

## 2011-09-26 DIAGNOSIS — I359 Nonrheumatic aortic valve disorder, unspecified: Secondary | ICD-10-CM | POA: Insufficient documentation

## 2011-09-26 DIAGNOSIS — I079 Rheumatic tricuspid valve disease, unspecified: Secondary | ICD-10-CM | POA: Insufficient documentation

## 2011-09-26 DIAGNOSIS — I059 Rheumatic mitral valve disease, unspecified: Secondary | ICD-10-CM | POA: Insufficient documentation

## 2011-09-26 NOTE — Assessment & Plan Note (Signed)
EF improved on echo. May be because of less ETOH  F/U echo in a year

## 2011-09-26 NOTE — Assessment & Plan Note (Signed)
Discussed low carb diet.  Target hemoglobin A1c is 6.5 or less.  Continue current medications.  

## 2011-09-26 NOTE — Progress Notes (Signed)
Blake Wilson is seen today for F/U of nonischemic DCM with HTN and elevated lipids. He is doing well with no SOB, palpitations, edema or SSCP. His last echo in March 2011 which I reviewed showed mild LVE with mild diffuse hypokinesis EF 50-55% and mild MR. He has been compliant with his meds which has been an issue in the past. He is not working but says he goes to a gym 2-3 times / week and "walks around"  Reviewed echo from today and EF 55% with trivial MR and mild AR  ROS: Denies fever, malais, weight loss, blurry vision, decreased visual acuity, cough, sputum, SOB, hemoptysis, pleuritic pain, palpitaitons, heartburn, abdominal pain, melena, lower extremity edema, claudication, or rash.  All other systems reviewed and negative  General: Affect appropriate Healthy:  appears stated age HEENT: normal Neck supple with no adenopathy JVP normal no bruits no thyromegaly Lungs clear with no wheezing and good diaphragmatic motion Heart:  S1/S2 no murmur,rub, gallop or click PMI normal Abdomen: benighn, BS positve, no tenderness, no AAA no bruit.  No HSM or HJR Distal pulses intact with no bruits No edema Neuro non-focal Skin warm and dry No muscular weakness   Current Outpatient Prescriptions  Medication Sig Dispense Refill  . aspirin 81 MG tablet Take 81 mg by mouth daily. After a meal       . atorvastatin (LIPITOR) 80 MG tablet Take 1 tablet (80 mg total) by mouth at bedtime.  30 tablet  6  . Blood Glucose Monitoring Suppl (BLOOD GLUCOSE METER) kit by Other route. Check sugars once a day.       . carvedilol (COREG) 25 MG tablet Take 25 mg by mouth 2 (two) times daily.        . furosemide (LASIX) 40 MG tablet Take 40 mg by mouth 2 (two) times daily.        . Lancets MISC by Does not apply route as directed.        Marland Kitchen lisinopril (PRINIVIL,ZESTRIL) 40 MG tablet Take 40 mg by mouth daily.       . metFORMIN (GLUCOPHAGE) 1000 MG tablet Take 1,000 mg by mouth 2 (two) times daily with a meal.        .  potassium chloride (KLOR-CON) 10 MEQ CR tablet Take 10 mEq by mouth 4 (four) times daily.       . traMADol (ULTRAM) 50 MG tablet Take 50 mg by mouth every 6 (six) hours as needed. Maximum dose= 8 tablets per day       . travoprost, benzalkonium, (TRAVATAN) 0.004 % ophthalmic solution Place 1 drop into both eyes at bedtime.         Allergies  Review of patient's allergies indicates no known allergies.  Electrocardiogram:  Assessment and Plan

## 2011-09-26 NOTE — Patient Instructions (Signed)
Your physician wants you to follow-up in: YEAR WITH DR NISHAN  You will receive a reminder letter in the mail two months in advance. If you don't receive a letter, please call our office to schedule the follow-up appointment.  Your physician recommends that you continue on your current medications as directed. Please refer to the Current Medication list given to you today. 

## 2011-09-26 NOTE — Assessment & Plan Note (Signed)
Well controlled.  Continue current medications and low sodium Dash type diet.    

## 2011-09-26 NOTE — Assessment & Plan Note (Signed)
Cholesterol is at goal.  Continue current dose of statin and diet Rx.  No myalgias or side effects.  F/U  LFT's in 6 months. Lab Results  Component Value Date   LDLCALC 78 02/13/2010

## 2011-10-18 ENCOUNTER — Other Ambulatory Visit: Payer: Self-pay | Admitting: Family Medicine

## 2011-10-18 DIAGNOSIS — E119 Type 2 diabetes mellitus without complications: Secondary | ICD-10-CM

## 2011-10-18 NOTE — Telephone Encounter (Signed)
Refill request

## 2011-10-29 ENCOUNTER — Ambulatory Visit (INDEPENDENT_AMBULATORY_CARE_PROVIDER_SITE_OTHER): Payer: Medicare HMO | Admitting: Family Medicine

## 2011-10-29 ENCOUNTER — Encounter: Payer: Self-pay | Admitting: Family Medicine

## 2011-10-29 VITALS — BP 136/75 | HR 92 | Temp 98.0°F | Ht 72.0 in | Wt 221.0 lb

## 2011-10-29 DIAGNOSIS — M79609 Pain in unspecified limb: Secondary | ICD-10-CM

## 2011-10-29 DIAGNOSIS — F1010 Alcohol abuse, uncomplicated: Secondary | ICD-10-CM

## 2011-10-29 DIAGNOSIS — M79605 Pain in left leg: Secondary | ICD-10-CM | POA: Insufficient documentation

## 2011-10-29 DIAGNOSIS — Z23 Encounter for immunization: Secondary | ICD-10-CM

## 2011-10-29 DIAGNOSIS — I10 Essential (primary) hypertension: Secondary | ICD-10-CM

## 2011-10-29 DIAGNOSIS — E119 Type 2 diabetes mellitus without complications: Secondary | ICD-10-CM

## 2011-10-29 DIAGNOSIS — E785 Hyperlipidemia, unspecified: Secondary | ICD-10-CM

## 2011-10-29 HISTORY — DX: Pain in left leg: M79.605

## 2011-10-29 MED ORDER — BLOOD GLUCOSE METER KIT: PACK | Status: DC

## 2011-10-29 MED ORDER — POTASSIUM CHLORIDE CRYS ER 10 MEQ PO TBCR: 10.0000 meq | EXTENDED_RELEASE_TABLET | Freq: Every day | ORAL | Status: DC

## 2011-10-29 MED ORDER — CARVEDILOL 25 MG PO TABS: 25.0000 mg | ORAL_TABLET | Freq: Two times a day (BID) | ORAL | Status: DC

## 2011-10-29 MED ORDER — ATORVASTATIN CALCIUM 80 MG PO TABS: 80.0000 mg | ORAL_TABLET | Freq: Every day | ORAL | Status: DC

## 2011-10-29 MED ORDER — GABAPENTIN 300 MG PO CAPS: 300.0000 mg | ORAL_CAPSULE | Freq: Every evening | ORAL | Status: DC | PRN

## 2011-10-29 MED ORDER — METFORMIN HCL 1000 MG PO TABS: 1000.0000 mg | ORAL_TABLET | Freq: Two times a day (BID) | ORAL | Status: DC

## 2011-10-29 MED ORDER — GLUCOSE BLOOD VI STRP: ORAL_STRIP | Status: DC

## 2011-10-29 MED ORDER — TRAMADOL HCL 50 MG PO TABS: 50.0000 mg | ORAL_TABLET | Freq: Four times a day (QID) | ORAL | Status: DC | PRN

## 2011-10-29 MED ORDER — LISINOPRIL 40 MG PO TABS: 40.0000 mg | ORAL_TABLET | Freq: Every day | ORAL | Status: DC

## 2011-10-29 MED ORDER — SITAGLIPTIN PHOSPHATE 100 MG PO TABS: 100.0000 mg | ORAL_TABLET | Freq: Every day | ORAL | Status: DC

## 2011-10-29 NOTE — Assessment & Plan Note (Signed)
Stable. Hgb A1c 6.8 today. However, may be having paresthesias. Adding Januvia today. Continue maximum dose metformin. Follow-up in 3 months.

## 2011-10-29 NOTE — Assessment & Plan Note (Signed)
Concerning for diabetic peripheral neuropathy. Starting gabapentin and adding Januvia for improved glycemic control, although Hgb A1c is not bad at 6.8 today. Goal is < 6.5.

## 2011-10-29 NOTE — Assessment & Plan Note (Addendum)
Seems improved for the patient. Drinks 1 shot of vodka daily but not going on drunken binges. Cardiologist noted that improvement in drinking may be helping cardiac function.

## 2011-10-29 NOTE — Assessment & Plan Note (Signed)
Controlled.  Continue current medications.

## 2011-10-29 NOTE — Progress Notes (Signed)
  Subjective:    Patient ID: Blake Wilson, male    DOB: 05-Oct-1951, 61 y.o.   MRN: [id number]  HPI Follow-up: 1. DM Complaining of paresthesias left lower extremity. Denies numbness. Worse at night and sometimes comes on while walking. Feels like pins and needles. Spasms of pain.  Has appointment with eye doctor within next month ROS: denies chest pain, difficulty breathing, vision changes  2. HTN, NI dilated CM Compliant with medications Recently saw cardiologist 1 year ago. No new changes.  3. HLD Compliant with medication ROS: denies muscle aches, RUQ pain  4. Alcoholism Drinking 1 shot vodka daily. No longer goes on binges.  ROS: denies abdominal pain   Review of Systems Per HPI    Objective:   Physical Exam Gen: NAD Psych: engaged, appropriate, pleasant, friendly CV: RRR, no m/r/g Pulm: CTAB, no w/r/r, NI WOB Abd: soft, NT, distended Ext: non-pitting pretibial edema; 2+ pedal pulses bilaterally; cool feet with long toenails but no skin lesions Neuro: grossly intact   Left lower extremity: no abnormalities, good strength/sensation, no skin findings    Assessment & Plan:

## 2011-10-29 NOTE — Patient Instructions (Signed)
It was nice to see you today [name].  Flu shot today. You may get the shingles vaccine at a pharmacy. It may be expensive so call and ask about the price beforehand if you are interested.  Your last colonoscopy was in March 2012. Your next one should be in 5-10 years.   We will add a medicine called Januvia to see if we can get your sugars under better control.  We will also start gabapentin at bedtime to help with your leg pain.   Follow-up if your leg pain gets worse within next month. Otherwise, follow-up in 3 months for your diabetes. Come in 3 days before your appointment for a lab visit. Come in fasting so we can check your cholesterol also.

## 2011-10-30 ENCOUNTER — Encounter: Payer: MEDICARE | Attending: Family Medicine | Admitting: Dietician

## 2011-10-30 DIAGNOSIS — Z713 Dietary counseling and surveillance: Secondary | ICD-10-CM | POA: Insufficient documentation

## 2011-10-30 DIAGNOSIS — H409 Unspecified glaucoma: Secondary | ICD-10-CM

## 2011-10-30 DIAGNOSIS — E119 Type 2 diabetes mellitus without complications: Secondary | ICD-10-CM | POA: Insufficient documentation

## 2011-10-30 DIAGNOSIS — I10 Essential (primary) hypertension: Secondary | ICD-10-CM

## 2011-10-30 NOTE — Patient Instructions (Addendum)
   Look for the low sodium or no added salt in the chips,  Continue to use the mayo/miracle whip rather than mustard (high in sodium)  Look for a pickle that has less salt than the dill pickle. Bread and butter chips can be made with the Spenda or sugar substitute and they less sodium.  Keep taking your blood glucose and recording it.  If feet are not a problem, try to go to the [facility] four times per week.  The aerobic exercise will lower blood glucose, help with weight loss.  Thanks for coming and plan to have a happy new year.  By April loose 10 lbs.  When sick and not able to keep fluids down and getting dehydrated, take your Januvia and hold the Metformin until you are able to take fluids and keep them down.

## 2011-10-30 NOTE — Progress Notes (Signed)
  Medical Nutrition Therapy:  Appt start time: 0900 end time:  0930.  Assessment:  Primary concerns today: Has gained weight that he had lost in the fall and his A1C has increased to 6.8%.  Saw his new MD yesterday.  Has started new med for peripheral neuropathy (Neurontin) and for diabetes (Januvia).  Maintains that his diet remains the same.  Has not been exercising.  Declares that he will begin to go to the [facility] and do the biking and walking.   Blood Glucose Monitoring:  Monitoring fasting and 2 hours post dinner.  Fasting:95,119,104,96,97,111,112.  Post Dinner: 133, 139, 121, 150, 134, 96, 149, 124, 152, 165, 150, 132. Glucose meter comparison test revealed his meter to be with 1 mg/dl of the compared meter.  MEDICATIONS: Added the Neurontin and the Januvia.  DIETARY INTAKE:  24-hr recall:  B (7:00-7:30 AM): Eat breakfast 6 out 7 days during the week.  Bowl cereal; 1.5 cup corn flakes, milk (whole) cup coffee with creamer, splenda.   Snk ( AM) :none  L (12:00 PM): Sandwich; balona, ham, salami with bread (2) white bread, salad dressing, tomato, lettuce, cheese. Glass milk or water. Milk (8 oz.) small bag of chips (portioned bag)  Snk (mid PM): peanuts (use the bags of peanuts)  salted D (5:00 PM): chicken breast 4 oz, greens 1/2-3/4 cup and 1/2 cupblack eye peas with water. Snk (evening PM): rare maybe a dill pickle Beverages: water, milk rare alcohol.  Recent physical activity: Has started at the [employer].  Bicycling and using the treadmill.  Going to try to get there 3 times per week.    Estimated energy needs:Ht: 73 inches, wt. 223.2lb (101 kg) IBW:184 =/- 10%, Adj: 199 lb 1800 calories 200-205 g carbohydrates 130-135 g protein 48-50 g fat  Recall reveals an increase in sodium sources.  Recommended using the unsalted versions of the various snack foods. Progress Towards Goal(s):  In progress.   Nutritional Diagnosis:  [place]-2.1 Inpaired nutrition utilization As related to  glucose/carbohydrate.  As evidenced by increased A1C and diagnosis of type 2 diabetes.    Intervention:  Nutrition Decrease his sodium intake with the change to lower sodium selections.  Continue to monitor portions and limit carb intake.  Handouts given during visit include:  Blood glucose log book  Refrigerator magnet displaying sick day self-care recommendations  Monitoring/Evaluation:  Dietary intake, exercise, blood glucose, and body weight in April following appointment with primary doctor.  To call for appointment.

## 2011-10-31 ENCOUNTER — Other Ambulatory Visit: Payer: Self-pay | Admitting: Cardiovascular Disease

## 2011-10-31 MED ORDER — CARVEDILOL 25 MG PO TABS: 25.0000 mg | ORAL_TABLET | Freq: Two times a day (BID) | ORAL | Status: DC

## 2011-12-12 ENCOUNTER — Telehealth: Payer: Self-pay | Admitting: Family Medicine

## 2011-12-12 DIAGNOSIS — E119 Type 2 diabetes mellitus without complications: Secondary | ICD-10-CM

## 2011-12-12 MED ORDER — GLUCOSE BLOOD VI STRP: ORAL_STRIP | Status: AC

## 2011-12-12 NOTE — Telephone Encounter (Signed)
Printed and put in front office

## 2011-12-12 NOTE — Telephone Encounter (Signed)
Patient stopped by and needs a prescription for test strips.  He has [employer] machine.

## 2011-12-17 ENCOUNTER — Telehealth: Payer: Self-pay | Admitting: Family Medicine

## 2011-12-17 NOTE — Telephone Encounter (Signed)
[name] needed refill for lisinopril faxed back to new mail order pharmacy[facility].  They should have sent a request last week.  Have not received yet.  Patient asking that refill be faxed and to call him when completed.

## 2011-12-20 NOTE — Telephone Encounter (Signed)
I did not receive the fax. Will you tell Blake Wilson that I am sorry but ask [facility] to re-send? Thank you Britta Mccreedy.

## 2011-12-25 ENCOUNTER — Encounter: Payer: Self-pay | Admitting: Family Medicine

## 2011-12-25 ENCOUNTER — Telehealth: Payer: Self-pay | Admitting: Family Medicine

## 2011-12-25 NOTE — Telephone Encounter (Signed)
error 

## 2011-12-25 NOTE — Telephone Encounter (Signed)
Patient is calling to find out what is going on with his refill on lisinopril from [facility].  He is out of his medication.  He was angry that this has not been resolved.

## 2011-12-25 NOTE — Telephone Encounter (Signed)
Spoke with patient and advised him that refills on the Llisinopril had been sent to [facility] on 10/29/2011 with 12 refills. However patient states he has changed to a mail order pharmacy because  it is less expensive. We received a fax from the mail order today but unable to see any notes that we have received before today.  The fax today states it is the second request.  Dr. Deirdre Priest signed the fax from today  and patient notified that it has been faxed. Advised that he can get a week's supply from [facility] to last until he gets his mail order in.  Sent in # 90 tab with 2 refills.   Also patient states he was unable to get the Januvia that MD prescribed at last visit.  Never started the Januvia. Appointment scheduled for 01/08/2012 to follow up about diabetes. Will forward message to MD.

## 2011-12-25 NOTE — Telephone Encounter (Signed)
This encounter was created in error - please disregard.

## 2012-01-08 ENCOUNTER — Encounter: Payer: Self-pay | Admitting: Family Medicine

## 2012-01-08 ENCOUNTER — Ambulatory Visit (INDEPENDENT_AMBULATORY_CARE_PROVIDER_SITE_OTHER): Payer: Medicare Other | Admitting: Family Medicine

## 2012-01-08 VITALS — BP 135/71 | HR 76 | Temp 98.0°F | Ht 73.0 in | Wt 227.0 lb

## 2012-01-08 DIAGNOSIS — M79605 Pain in left leg: Secondary | ICD-10-CM

## 2012-01-08 DIAGNOSIS — M79609 Pain in unspecified limb: Secondary | ICD-10-CM

## 2012-01-08 DIAGNOSIS — I10 Essential (primary) hypertension: Secondary | ICD-10-CM

## 2012-01-08 DIAGNOSIS — E119 Type 2 diabetes mellitus without complications: Secondary | ICD-10-CM

## 2012-01-08 DIAGNOSIS — E785 Hyperlipidemia, unspecified: Secondary | ICD-10-CM

## 2012-01-08 LAB — LIPID PANEL
Cholesterol: 169 mg/dL (ref 0–200)
HDL: 51 mg/dL (ref >39)
Triglycerides: 84 mg/dL (ref <150)

## 2012-01-08 LAB — BASIC METABOLIC PANEL
BUN: 22 mg/dL (ref 6–23)
Calcium: 8.9 mg/dL (ref 8.4–10.5)
Chloride: 106 mEq/L (ref 96–112)
Creat: 1 mg/dL (ref 0.50–1.35)

## 2012-01-08 MED ORDER — GABAPENTIN 300 MG PO CAPS: 300.0000 mg | ORAL_CAPSULE | Freq: Every evening | ORAL | Status: DC | PRN

## 2012-01-08 NOTE — Assessment & Plan Note (Signed)
Controlled. Continue current medications, including potassium. On Coreg, Lasix.  Consider discontinuing Lasix and changing to HCTZ? Checking BMET today for K, Cr.

## 2012-01-08 NOTE — Assessment & Plan Note (Addendum)
History of being controlled.  Compliant with statin and without side effects.  Checking lipids today>>>LDL elevated 101 (70s 1 year ago). Patient working on losing weight; had gained weight over past few months. Continue current statin dose for now, re-check in about 6 months.

## 2012-01-08 NOTE — Assessment & Plan Note (Addendum)
Improved without medications. Unable to fill gabapentin/Neurontin due to cost ($40). Will try calling in and specifically asking for generic. Mail prescription program: [phone].

## 2012-01-08 NOTE — Assessment & Plan Note (Signed)
Controlled. Home BS good 120-150s. Too early to check A1c today (last checked 10/2011, 6.8). Continue metformin. D/C Januvia (prescribed last visit due to possibly getting better glycemic control in setting of diabetic peripheral neuropathy, but patient never filled and today reports improvement of neuropathic symptoms). Follow-up in 3 months.  Next eye exam August 2013 (goes q22mo 2/2 glaucoma).

## 2012-01-08 NOTE — Patient Instructions (Signed)
It was nice to see you today sir. Good job on keeping your sugars under control and for checking them twice a day. Continue the metformin.  I will call in the medicine for your feet pain for you.   Good job on losing weight recently. Keep up a healthy diet with lots of fresh fruits and vegetables and water and try to exercise 5 times a week, 30 minutes at a time.  Follow-up in 3 months.

## 2012-01-08 NOTE — Progress Notes (Signed)
  Subjective:    Patient ID: Blake Wilson, male    DOB: June 30, 1951, 61 y.o.   MRN: [id number]  HPI Follow-up diabetes  HgbA1c 10/2011 6.8 Started on Januvia in January but patient not taking. BS at home (patient brought glucometer) 120-150s recently. Started on this medication due to worsening feet pain thought 2/2 diabetic peripheral neuropathy.  Feet pain is better. Patient unable to afford gabapentin ($40). Did try this in the past and it seemed to help prn, especially at night.  Opthal appointment q6 mo due to glaucoma. Next in August.  ROS: denies chest pain, numbness, vision changes  2. HLD Compliant with statin Trend up in weight but reports losing weight recently ROS: denies RUQ pain, myalgias  3. HTN Compliant with medications Does not take blood pressures at home   Review of Systems Per HPI    Objective:   Physical Exam Gen: NAD, obese CV: RRR, no m/r/g Pulm: NI WOB Abd: obese, non-tender, soft Ext:    Feet: no lesions including calluses or sores; 2+ pulses; no edema; sensation intact throughout    Assessment & Plan:

## 2012-01-09 ENCOUNTER — Encounter: Payer: Self-pay | Admitting: Family Medicine

## 2012-01-10 ENCOUNTER — Telehealth: Payer: Self-pay | Admitting: *Deleted

## 2012-01-10 DIAGNOSIS — M79605 Pain in left leg: Secondary | ICD-10-CM

## 2012-01-10 NOTE — Telephone Encounter (Signed)
Received fax from pharmacy needing clarification on on gabapentin RX. Directions are one capsule at bedtime as needed for leg pain. MD gave total of #270 tabs and pharmacy is questioning amount for 90 days or directions. Consulted with Dr. Deirdre Priest and he advises should be # 90 for 90 days. Gave this message to pharmacist.

## 2012-01-13 MED ORDER — GABAPENTIN 300 MG PO CAPS: 300.0000 mg | ORAL_CAPSULE | Freq: Every evening | ORAL | Status: DC | PRN

## 2012-01-13 NOTE — Telephone Encounter (Signed)
Thank you Larita Fife and Dr. Deirdre Priest.  I am sorry about that.  90 tablets seems appropriate.

## 2012-04-10 ENCOUNTER — Other Ambulatory Visit: Payer: Self-pay | Admitting: Family Medicine

## 2012-04-10 DIAGNOSIS — M79605 Pain in left leg: Secondary | ICD-10-CM

## 2012-04-10 MED ORDER — GABAPENTIN 300 MG PO CAPS: 300.0000 mg | ORAL_CAPSULE | Freq: Every evening | ORAL | Status: DC | PRN

## 2012-04-21 ENCOUNTER — Other Ambulatory Visit: Payer: Self-pay | Admitting: *Deleted

## 2012-04-21 MED ORDER — CARVEDILOL 25 MG PO TABS: 25.0000 mg | ORAL_TABLET | Freq: Two times a day (BID) | ORAL | Status: DC

## 2012-04-21 NOTE — Telephone Encounter (Signed)
Fax Received. Refill Completed. Ashlyn Cabler Chowoe (R.M.A)   

## 2012-04-24 ENCOUNTER — Encounter: Payer: Self-pay | Admitting: Family Medicine

## 2012-04-24 ENCOUNTER — Ambulatory Visit (INDEPENDENT_AMBULATORY_CARE_PROVIDER_SITE_OTHER): Payer: Medicare Other | Admitting: Family Medicine

## 2012-04-24 VITALS — BP 137/78 | HR 63 | Ht 73.0 in | Wt 232.6 lb

## 2012-04-24 DIAGNOSIS — M79609 Pain in unspecified limb: Secondary | ICD-10-CM

## 2012-04-24 DIAGNOSIS — I10 Essential (primary) hypertension: Secondary | ICD-10-CM

## 2012-04-24 DIAGNOSIS — M329 Systemic lupus erythematosus, unspecified: Secondary | ICD-10-CM

## 2012-04-24 DIAGNOSIS — E119 Type 2 diabetes mellitus without complications: Secondary | ICD-10-CM

## 2012-04-24 DIAGNOSIS — M79605 Pain in left leg: Secondary | ICD-10-CM

## 2012-04-24 MED ORDER — GABAPENTIN 300 MG PO CAPS: ORAL_CAPSULE | ORAL | Status: DC

## 2012-04-24 NOTE — Assessment & Plan Note (Signed)
See diabetes A/P.  

## 2012-04-24 NOTE — Assessment & Plan Note (Signed)
Controlled.  Continue metformin. Keep eye doctor appointment next month.  Increase gabapentin, may take 1-3 tablets qhs as needed, for neuropathic pain. Discussed side effects (sedation).

## 2012-04-24 NOTE — Progress Notes (Signed)
  Subjective:    Patient ID: Blake Wilson, male    DOB: [birth year], 61 y.o.   MRN: [id number]  HPI 1. Follow-up: diabetes HgbA1c 6.6 today. Down from 6.8 10/2011.  Compliant with metformin. No side effects.  Mild-moderate feet tingling. Gabapentin helps some. Takes once at bedtime.  ROS: denies chest pain, thinks he may need new glasses (he has an appointment with his eye doctor next month)  2. HTN Controlled Compliant with medications: ROS: denies dyspnea  Review of Systems Per HPI.  Past Medical History, Family History, Social History, Allergies, and Medications reviewed. HLD, SLE    Objective:   Physical Exam Gen: NAD; well-appearing, -nourished PSYCH: pleasant, engaged and normally conversant, appropriate to questions, alert and oriented CV: RRR, normal S1/S2, no m/r/g PULM: NI WOB; CTAB without w/r/r ABD: soft, NT, ND EXT: no edema SKIN: warm, dry, no rash  FEET: long toe nails; no lesions; sensation intact; warm; 1+ pedal pulses bilaterally    Assessment & Plan:

## 2012-04-24 NOTE — Patient Instructions (Addendum)
Follow-up in 3 months (October 2013).  Call regarding the diabetic shoes.  Keep up the good work Mr. Coopman!

## 2012-04-24 NOTE — Assessment & Plan Note (Signed)
Controlled. Continue current regimen. Last K 4.3, Cr WNL.

## 2012-07-27 ENCOUNTER — Encounter: Payer: Self-pay | Admitting: Family Medicine

## 2012-07-27 DIAGNOSIS — E119 Type 2 diabetes mellitus without complications: Secondary | ICD-10-CM

## 2012-10-07 ENCOUNTER — Ambulatory Visit (INDEPENDENT_AMBULATORY_CARE_PROVIDER_SITE_OTHER): Payer: Medicare Other | Admitting: Family Medicine

## 2012-10-07 ENCOUNTER — Encounter: Payer: Self-pay | Admitting: Family Medicine

## 2012-10-07 VITALS — BP 151/62 | HR 78 | Temp 98.0°F | Ht 73.0 in | Wt 235.0 lb

## 2012-10-07 DIAGNOSIS — E669 Obesity, unspecified: Secondary | ICD-10-CM

## 2012-10-07 DIAGNOSIS — Z23 Encounter for immunization: Secondary | ICD-10-CM

## 2012-10-07 DIAGNOSIS — G479 Sleep disorder, unspecified: Secondary | ICD-10-CM

## 2012-10-07 DIAGNOSIS — E119 Type 2 diabetes mellitus without complications: Secondary | ICD-10-CM

## 2012-10-07 LAB — LDL CHOLESTEROL, DIRECT: Direct LDL: 71 mg/dL

## 2012-10-07 MED ORDER — TADALAFIL 20 MG PO TABS: 10.0000 mg | ORAL_TABLET | ORAL | Status: DC | PRN

## 2012-10-07 MED ORDER — NITROGLYCERIN 0.4 MG SL SUBL: 0.4000 mg | SUBLINGUAL_TABLET | SUBLINGUAL | Status: DC | PRN

## 2012-10-07 NOTE — Assessment & Plan Note (Signed)
Sleep study ordered

## 2012-10-07 NOTE — Patient Instructions (Signed)
Rx for Cialis. Do not take Cialis with nitroglycerin.   We will refer you for a sleep study.  I will call you with cholesterol results.  Our goal is to lose 10 pounds the next time you see me in 3 months.  Try to exercise 30 minutes 3 x a week.

## 2012-10-07 NOTE — Progress Notes (Signed)
  Subjective:    Patient ID: Blake Wilson, male    DOB: 01/28/1951, 61 y.o.   MRN: [id number]  HPI # Diabetes follow-up, not on insulin HgbA1c 7.1 today Home BS: 120-160s   Outliers: 90, 105; few 190-200s  # Obesity He has stopped going to the [facility] He gained 3 pounds today  # Requesting Rx for Cialis, 30 tablets since free samples offered on website  # HLD Compliant with statin No side effects  # Requesting OSA testing He does not know if he snores and denies daytime sleepiness, however, he wakes up in the middle of the night often. Melatonin helps him to fall asleep but he still wakes up.   Review of Systems He gets chest pain "rarely". Denies currently. Denies dyspnea  Allergies, medication, past medical history reviewed.  -CAD, CHF, CM--cardiologist Dr. Eden Emms  -SLE -Alcohol abuse    Objective:   Physical Exam Gen: NAD; well-appearing; obese NECK: thick PSYCH: pleasant, engaged and normally conversant, appropriate to questions, alert and oriented CV: RRR, normal S1/S2 but decreased HS, no m/r/g; no carotid bruits PULM: NI WOB; CTAB without w/r/r ABD: soft, NT, ND, obese EXT: no edema SKIN: warm, dry, no rash  FEET: warm; 2+ DP/PT pulses bilaterally; sensation intact; toe nails yellow and hypertrophies; no calluses or other skin lesions    Assessment & Plan:

## 2012-10-07 NOTE — Assessment & Plan Note (Signed)
See above. He will try to lose 10 pounds next 3 months. Start exercising again, 30 minutes x 3 times week at [facility].

## 2012-10-07 NOTE — Assessment & Plan Note (Signed)
HgbA1c 7.1 elevated from 6.8.  Continue metformin maximum dose. He will try to exercise more and lose weight rather than starting another medication (consider glypizide; Januvia too expensive for him). Follow-up in 3 months.  Checking LDL. He is on atorvastatin 80. Continue aspirin 81.

## 2012-10-08 ENCOUNTER — Telehealth: Payer: Self-pay | Admitting: Family Medicine

## 2012-10-08 DIAGNOSIS — E785 Hyperlipidemia, unspecified: Secondary | ICD-10-CM

## 2012-10-08 NOTE — Telephone Encounter (Signed)
Telephone call. Notified that LDL at goal. It is 71. Continue high dose atorvastatin. Also reiterated importance of weight loss and encouraged him to exercise.   

## 2012-10-08 NOTE — Assessment & Plan Note (Signed)
Telephone call. Notified that LDL at goal. It is 71. Continue high dose atorvastatin. Also reiterated importance of weight loss and encouraged him to exercise.

## 2012-10-29 ENCOUNTER — Ambulatory Visit (HOSPITAL_BASED_OUTPATIENT_CLINIC_OR_DEPARTMENT_OTHER): Payer: Medicare Other | Attending: Family Medicine

## 2012-10-29 VITALS — Ht 73.0 in | Wt 235.0 lb

## 2012-10-29 DIAGNOSIS — G479 Sleep disorder, unspecified: Secondary | ICD-10-CM

## 2012-10-29 DIAGNOSIS — G4733 Obstructive sleep apnea (adult) (pediatric): Secondary | ICD-10-CM

## 2012-10-30 ENCOUNTER — Telehealth: Payer: Self-pay | Admitting: Family Medicine

## 2012-10-30 NOTE — Telephone Encounter (Signed)
Patient is needing a refill of Furosemide faxed to [facility]. Phone number is  [phone].

## 2012-10-31 DIAGNOSIS — R0989 Other specified symptoms and signs involving the circulatory and respiratory systems: Secondary | ICD-10-CM

## 2012-10-31 DIAGNOSIS — G4733 Obstructive sleep apnea (adult) (pediatric): Secondary | ICD-10-CM

## 2012-10-31 DIAGNOSIS — R0609 Other forms of dyspnea: Secondary | ICD-10-CM

## 2012-10-31 NOTE — Procedures (Cosign Needed)
NAME:  Blake Wilson, Blake Wilson              ACCOUNT NO.:  [id number]  MEDICAL RECORD NO.:  [id number]          PATIENT TYPE:  OUT  LOCATION:  SLEEP CENTER                 FACILITY:  [facility]  PHYSICIAN:  Mohd. Derflinger D. Maple Hudson, MD, FCCP, FACPDATE OF BIRTH:  1950-[birth year]  DATE OF STUDY:  10/29/2012                           NOCTURNAL POLYSOMNOGRAM  REFERRING PHYSICIAN:  Chrissie Noa A. Leveda Anna, M.D.  REFERRING PHYSICIAN:  Santiago Bumpers. Leveda Anna, M.D.  INDICATION FOR STUDY:  Hypersomnia with sleep apnea.  EPWORTH SLEEPINESS SCORE:  10/24.  BMI 31, weight 235 pounds, height 71 inches, neck 17 inches.  MEDICATIONS:  Home medications are charted and reviewed as "none written down."  SLEEP ARCHITECTURE:  Total sleep time 188.5 minutes with sleep efficiency 50.1%.  Stage I was 2.7%, stage II 76.9%, stage III 2.1%, REM 18.3% of total sleep time.  Sleep latency 65.5 minutes, REM latency 75 minutes.  Awake after sleep onset 99 minutes.  Arousal index 8.9. Bedtime medication:  None.  RESPIRATORY DATA:  Apnea hypopnea index (AHI) 11.5 per hour.  A total of 36 events was scored including 12 obstructive apneas, 3 central apneas, 4 mixed apneas, 17 hypopneas.  Events were seen in all sleep positions. REM AHI 29.6 per hour.  There were not enough early sleep events to qualify for split protocol CPAP titration on this study night.  OXYGEN DATA:  Loud snoring with oxygen desaturation to a nadir of 87% and mean oxygen saturation through the study of 96.3% on room air.  CARDIAC DATA:  Normal sinus rhythm.  MOVEMENT/PARASOMNIA:  No significant movement disturbance.  Bathroom x1.  IMPRESSIONS-RECOMMENDATIONS: 1. Difficulty initiating sleep with sleep onset around 12:30 a.m. and     then awake again between 1:15 and 2:15 a.m.  No bedtime medication     taken. 2. Mild obstructive sleep apnea/hypopnea syndrome, AHI 11.5 per hour     with non-positional events.  REM AHI 29.6 per hour.  Loud snoring     with oxygen desaturation  to a nadir of 87% and mean oxygen     saturation through the study of 96.3% on room air. 3. There were insufficient numbers of early events to meet protocol     requirements for CPAP titrations.  The patient can return for     dedicated CPAP titration if appropriate.     Akiel Fennell D. Maple Hudson, MD, [facility], FACP Diplomate, American Board of Sleep Medicine    CDY/MEDQ  D:  10/31/2012 12:17:00  T:  10/31/2012 13:50:38  Job:  [id number]

## 2012-11-01 MED ORDER — FUROSEMIDE 40 MG PO TABS: 40.0000 mg | ORAL_TABLET | Freq: Two times a day (BID) | ORAL | Status: DC

## 2012-11-01 NOTE — Telephone Encounter (Signed)
Please call in and notify patient of medication and that follow-up is needed within next 3 months to address cardiac disease and hypertension. Thank you.

## 2012-11-02 MED ORDER — FUROSEMIDE 40 MG PO TABS: 40.0000 mg | ORAL_TABLET | Freq: Two times a day (BID) | ORAL | Status: DC

## 2012-11-02 NOTE — Telephone Encounter (Signed)
Back to MD to clarify Rx.  Rx says pt is to take BID but only 30 are called in with 3 refills.  Also Mail order is normally 90 day supply.  Milas Gain, Maryjo Rochester

## 2012-11-02 NOTE — Telephone Encounter (Signed)
Sorry Shanda Bumps. Proper order in.

## 2012-11-02 NOTE — Addendum Note (Signed)
Addended by: Priscella Mann J on: 11/02/2012 02:39 PM   Modules accepted: Orders

## 2012-11-03 MED ORDER — FUROSEMIDE 40 MG PO TABS: 40.0000 mg | ORAL_TABLET | Freq: Two times a day (BID) | ORAL | Status: DC

## 2012-11-03 NOTE — Telephone Encounter (Signed)
Rx sent in electronically and pt informed.  He will call back to schedule an appt. Roy Tokarz, Maryjo Rochester

## 2012-11-03 NOTE — Addendum Note (Signed)
Addended by: Jone Baseman D on: 11/03/2012 11:12 AM   Modules accepted: Orders

## 2012-11-23 ENCOUNTER — Other Ambulatory Visit: Payer: Self-pay | Admitting: *Deleted

## 2012-11-23 MED ORDER — LISINOPRIL 40 MG PO TABS: 40.0000 mg | ORAL_TABLET | Freq: Every day | ORAL | Status: DC

## 2012-12-22 ENCOUNTER — Ambulatory Visit: Payer: Medicare Other | Admitting: Family Medicine

## 2012-12-24 ENCOUNTER — Ambulatory Visit (INDEPENDENT_AMBULATORY_CARE_PROVIDER_SITE_OTHER): Payer: Medicare Other | Admitting: Family Medicine

## 2012-12-24 ENCOUNTER — Encounter: Payer: Self-pay | Admitting: Family Medicine

## 2012-12-24 VITALS — BP 112/70 | HR 87 | Temp 97.6°F | Ht 72.0 in | Wt 228.6 lb

## 2012-12-24 DIAGNOSIS — I10 Essential (primary) hypertension: Secondary | ICD-10-CM

## 2012-12-24 DIAGNOSIS — G479 Sleep disorder, unspecified: Secondary | ICD-10-CM

## 2012-12-24 LAB — POCT GLYCOSYLATED HEMOGLOBIN (HGB A1C): Hemoglobin A1C: 7.1

## 2012-12-24 MED ORDER — SILDENAFIL CITRATE 50 MG PO TABS: 50.0000 mg | ORAL_TABLET | Freq: Every day | ORAL | Status: DC | PRN

## 2012-12-24 MED ORDER — LISINOPRIL 40 MG PO TABS: 40.0000 mg | ORAL_TABLET | Freq: Every day | ORAL | Status: DC

## 2012-12-24 NOTE — Patient Instructions (Addendum)
Increase your metformin to twice a day Take with food   Exercise!  Do not take Viagra if you take nitroglycerin that day   Follow-up in 3 months

## 2012-12-24 NOTE — Assessment & Plan Note (Signed)
Controlled. Continue current regimen. 

## 2012-12-24 NOTE — Progress Notes (Signed)
  Subjective:    Patient ID: Blake Wilson, male    DOB: 1950-11-26, 62 y.o.   MRN: [id number]  HPI # Diabetes, well controlled, not on insulin Takes metformin once a day. He denies nausea or muscle aches.  He checks his sugars at home once a day, 2 hours after he eats breakfast.    It is usually 120-140s.    Highest 270 after eating a carbohydrate heavy meal    Lowest 89 He does not exercise regularly. He likes to use the treadmill when he does exercise.  ROS: denies paresthesias takes gabapentin once a day   # Hypertension He is compliant with his anti hypertensive. Coreg, lisinopril, Lasix once daily. Cardiologist Nishant.    Review of Systems Per HPI  Allergies, medication, past medical history reviewed.  Smoking status noted.     Objective:   Physical Exam GEN: NAD CV: RRR, no m/r/g PULM: NI WOB EXT: no edema    Assessment & Plan:

## 2012-12-24 NOTE — Assessment & Plan Note (Signed)
Notified of sleep study results. Some component of OSA but not enough to warrant CPAP.

## 2012-12-24 NOTE — Assessment & Plan Note (Addendum)
Unchanged but not quite at goal. A1c 7.1.  Increase metformin 1000 mg from qd to bid.  Encouraged him to exercise regularly.    Follow-up 3 months.

## 2013-03-02 ENCOUNTER — Telehealth: Payer: Self-pay | Admitting: Family Medicine

## 2013-03-02 NOTE — Telephone Encounter (Signed)
Patient dropped off a form for his dm supplies.  Please fax when completed.

## 2013-03-02 NOTE — Telephone Encounter (Signed)
[facility] form for Diabetic Testing Supplies completed and placed in Dr. Bluford Kaufmann [place]'s box for review. Will need to be signed by Faculty since patient has Medicare.  Blake Wilson

## 2013-03-03 NOTE — Telephone Encounter (Signed)
Form completed and faxed to [phone].  Ileana Ladd

## 2013-03-05 ENCOUNTER — Other Ambulatory Visit: Payer: Self-pay | Admitting: *Deleted

## 2013-03-05 DIAGNOSIS — M79605 Pain in left leg: Secondary | ICD-10-CM

## 2013-03-05 NOTE — Telephone Encounter (Signed)
Requesting 90 day supply of gabapentin - fax placed in your box.Wyatt Haste, RN-BSN

## 2013-03-08 MED ORDER — GABAPENTIN 300 MG PO CAPS: ORAL_CAPSULE | ORAL | Status: DC

## 2013-10-25 ENCOUNTER — Encounter: Payer: Self-pay | Admitting: Family Medicine

## 2013-10-25 ENCOUNTER — Ambulatory Visit (INDEPENDENT_AMBULATORY_CARE_PROVIDER_SITE_OTHER): Payer: Medicare HMO | Admitting: Family Medicine

## 2013-10-25 VITALS — BP 155/93 | HR 105 | Temp 97.6°F | Wt 223.0 lb

## 2013-10-25 DIAGNOSIS — E119 Type 2 diabetes mellitus without complications: Secondary | ICD-10-CM

## 2013-10-25 DIAGNOSIS — R0789 Other chest pain: Secondary | ICD-10-CM

## 2013-10-25 DIAGNOSIS — Z23 Encounter for immunization: Secondary | ICD-10-CM

## 2013-10-25 DIAGNOSIS — R071 Chest pain on breathing: Secondary | ICD-10-CM

## 2013-10-25 DIAGNOSIS — E785 Hyperlipidemia, unspecified: Secondary | ICD-10-CM

## 2013-10-25 DIAGNOSIS — I10 Essential (primary) hypertension: Secondary | ICD-10-CM

## 2013-10-25 LAB — COMPREHENSIVE METABOLIC PANEL
ALK PHOS: 98 U/L (ref 39–117)
ALT: 48 U/L (ref 0–53)
AST: 22 U/L (ref 0–37)
Albumin: 4.3 g/dL (ref 3.5–5.2)
BILIRUBIN TOTAL: 0.8 mg/dL (ref 0.3–1.2)
BUN: 12 mg/dL (ref 6–23)
CO2: 25 meq/L (ref 19–32)
CREATININE: 1.04 mg/dL (ref 0.50–1.35)
Calcium: 9.2 mg/dL (ref 8.4–10.5)
Chloride: 98 mEq/L (ref 96–112)
GLUCOSE: 314 mg/dL — AB (ref 70–99)
Potassium: 3.9 mEq/L (ref 3.5–5.3)
SODIUM: 133 meq/L — AB (ref 135–145)
Total Protein: 7.4 g/dL (ref 6.0–8.3)

## 2013-10-25 LAB — LIPID PANEL
CHOL/HDL RATIO: 5 ratio
CHOLESTEROL: 261 mg/dL — AB (ref 0–200)
HDL: 52 mg/dL (ref >39)
LDL CALC: 182 mg/dL — AB (ref 0–99)
TRIGLYCERIDES: 136 mg/dL (ref <150)
VLDL: 27 mg/dL (ref 0–40)

## 2013-10-25 LAB — POCT GLYCOSYLATED HEMOGLOBIN (HGB A1C): Hemoglobin A1C: 10.6

## 2013-10-25 MED ORDER — FUROSEMIDE 40 MG PO TABS: 40.0000 mg | ORAL_TABLET | Freq: Every day | ORAL | Status: DC

## 2013-10-25 NOTE — Progress Notes (Signed)
Patient ID: Blake Wilson, male   DOB: 1951-10-18, 64 y.o.   MRN: [id number] Subjective:   CC: Follow-up DM and HTN  HPI:   1. Follow-up DM: Patient reports taking medications as prescribed. Also logged 2 hour postprandial glucose which has run 330-450 over the last week and 260-440 the week prior. He has felt increasingly thirsty lately. He also had shaking 2 weeks ago that he thinks was his glucose getting low but did not measure it.  2. Follow-up HTN: Reports taking medications as prescribed but did not take them this morning. Does not have a cuff at home. Denies dizziness, fainting, LE edema. Denies med side effects.   3. Chest tightness: Reports a few weeks of intermittent feeling like pressure that is random, not brought on by exertion, and does not radiate. Does not report dyspnea, nausea. Denies dizziness, syncope. Has not needed nitroglycerin in 1 year.  Review of Systems - Per HPI. Additionally, wants flu shot today.  PMH - needs no med refills today. - meds reviewed.    Objective:  Physical Exam BP 155/93  Pulse 105  Temp(Src) 97.6 F (36.4 C) (Oral)  Wt 223 lb (101.152 kg) GEN: NAD HEENT: Atraumatic, normocephalic, neck supple, EOMI, sclera clear  CV: RRR, no murmurs, rubs, or gallops PULM: CTAB, normal effort, chest with reproducible mild tenderness bilaterally to pressure ABD: Soft, nontender, nondistended SKIN: No rash or cyanosis; warm and well-perfused EXTR: No lower extremity edema or calf tenderness Foot exam: No deformities or skin breakdown Toenails long DP pulse intact bilaterally Sensation decreased bilaterally (not present middle toe pad, decreased at ball of foot, not present at heel). PSYCH: Mood and affect euthymic, normal rate and volume of speech NEURO: Awake, alert, no focal deficits grossly, normal speech    Assessment:     Blake Wilson is a 63 y.o. male with h/o diabetes and HTN here for follow-up.    Plan:     # See problem list  and after visit summary for problem-specific plans.   # Health Maintenance:  - Flu shot today. - Foot exam today, referred to ophtho.  Follow-up: Follow up in 2 weeks-58mo for follow-up of diabetes.    [name], MD [place]

## 2013-10-25 NOTE — Patient Instructions (Addendum)
It was good to meet you today.  For your diabetes, we are checking an A1c today and some labs. I will call you with this and recommendations for what to do with your medications. Check your feet daily and keep your toenails cut. Check your blood sugar and write it down when you are shaky. I have referred you to an eye doctor. Have them send me records once you get this checked.  For your blood pressure, check when you can at a [facility] or buy a cuff. I am refilling Lasix. You are getting a flu shot today.

## 2013-10-26 DIAGNOSIS — Z0000 Encounter for general adult medical examination without abnormal findings: Secondary | ICD-10-CM | POA: Insufficient documentation

## 2013-10-26 DIAGNOSIS — R0789 Other chest pain: Secondary | ICD-10-CM | POA: Insufficient documentation

## 2013-10-26 HISTORY — DX: Other chest pain: R07.89

## 2013-10-26 NOTE — Assessment & Plan Note (Signed)
BP mildly elevated but WNL for age >72, did not take meds today. - Continue current regimen. - Refilled lasix. - Check BP at home if able.

## 2013-10-26 NOTE — Assessment & Plan Note (Addendum)
Reproducible on exam, vitals stable, well-appearing, non-exertional and bilateral. Most likely MSK but pt has significant cardiovascular risk factors. - Reassured. Monitor. - Can use NSAIDs for pain relief. - If no improvement in 2-3 days, would check EKG. - Return precautions reviewed.

## 2013-10-26 NOTE — Assessment & Plan Note (Signed)
Elevated blood sugar but reports occasional "shaky" episodes. - Continue metformin - Foot exam today, encouraged cutting toenails, let me know if he would like to see podiatrist, and examine feet regularly. - A1c, lipid panel today, call pt with results and med recommendations. - Check glucose when shaky. - Referral for ophthalmologist Dr Katy Fitch placed.

## 2013-10-26 NOTE — Assessment & Plan Note (Signed)
-   Checking LFTs and lipids today.

## 2013-10-29 ENCOUNTER — Telehealth: Payer: Self-pay | Admitting: Family Medicine

## 2013-10-29 NOTE — Telephone Encounter (Signed)
Please let patient know I have filled out his forms [employer] manager Patient Assistance Program Enrollment form for [employer]) for sildenafil as he requested at our last office visit and faxed it.  He does need to sign the HIPAA authorization form that he dropped off, which he signed in the wrong place.  He can stop by clinic to do this. I will leave it up front in an envelope and after he signs, he can have staff put it in my mailbox.  [name], MD

## 2013-10-29 NOTE — Telephone Encounter (Signed)
Please call pt with the following:  - LFTs normal. - A1c is significantly elevated. - Lipids are also elevated (chol 261 from 169, LDL 182 from 101). He is on a high intensity statin already that it looks like from the chart he has been on at least since 2012.   I would like to see him back in clinic to discuss these findings and consider starting insulin and discussing diet/exercise to better control his diabetes, and when to recheck lipids.  [name], MD

## 2013-11-01 NOTE — Telephone Encounter (Signed)
Pt informed and appt made. Blake Wilson  

## 2013-11-09 ENCOUNTER — Other Ambulatory Visit: Payer: Self-pay | Admitting: Family Medicine

## 2013-11-09 MED ORDER — METFORMIN HCL 1000 MG PO TABS: 1000.0000 mg | ORAL_TABLET | Freq: Two times a day (BID) | ORAL | Status: DC

## 2013-11-15 ENCOUNTER — Telehealth: Payer: Self-pay | Admitting: Family Medicine

## 2013-11-15 NOTE — Telephone Encounter (Signed)
Called to touch base with patient regarding chest tightness he mentioned at our last visit. On discussion today, he states he has had this since the 1990s (not recent, like he had said last time) and it is intermittent, when he is active or laughing. He denies shortness of breath, DOE, fainting/dizziness, diaphoresis, or other concerns. Pain went away since last visit but has been back recently. He sees cardiology (Dr Johnsie Cancel) and is about due for his annual appointment there. He will call to make this. We reviewed return precautions but this seems like a chronic pain with no recent exacerbation or symptoms of cardiac worsening.  [name], MD

## 2013-11-16 ENCOUNTER — Ambulatory Visit (INDEPENDENT_AMBULATORY_CARE_PROVIDER_SITE_OTHER): Payer: Medicare HMO | Admitting: Family Medicine

## 2013-11-16 ENCOUNTER — Encounter: Payer: Self-pay | Admitting: Family Medicine

## 2013-11-16 VITALS — BP 168/97 | HR 88 | Temp 97.4°F | Ht 72.0 in | Wt 224.0 lb

## 2013-11-16 DIAGNOSIS — E119 Type 2 diabetes mellitus without complications: Secondary | ICD-10-CM

## 2013-11-16 DIAGNOSIS — I2510 Atherosclerotic heart disease of native coronary artery without angina pectoris: Secondary | ICD-10-CM

## 2013-11-16 DIAGNOSIS — H409 Unspecified glaucoma: Secondary | ICD-10-CM

## 2013-11-16 NOTE — Progress Notes (Signed)
Patient ID: Shanna Cisco, male   DOB: 12-15-1950, 63 y.o.   MRN: [id number] Subjective:   CC: Diabetes follow-up  HPI:   Patient is here to f/u on diabetes. His A1c was 10.6 at this last check, up from 7.19 Dec 2012. I would like to start him on insulin, though he is hesitant. He states he is on some medication through a research study. He injects this daily and they ask him to check blood sugars four times daily. He is unsure what it is but thinks it might be insulin once we discuss further. He denies increased urinary frequency, dizziness, fainting, anxiety, diaphoresis, or blurred vision. He has recorded bedtime blood sugars that are 160-200s and he thinks fasting are in 160s. His weight is stable from last visit, but down to 224 from 228 March 2014. He has been trying to control portion size. He gets full fast. He does not report chest pain or dyspnea.  Review of Systems - Per HPI.   PMH: Reviewed  Meds: Reviewed Pt unsure if he is taking insulin or not through research study.  Meds: Reviewed     Objective:  Physical Exam BP 168/97  Pulse 88  Temp(Src) 97.4 F (36.3 C) (Oral)  Ht 6' (1.829 m)  Wt 224 lb (101.606 kg)  BMI 30.37 kg/m2 BP recheck: 150/78 GEN: NAD, pleasant HEENT: Atraumatic, normocephalic, neck supple, EOMI, sclera clear  CV: RRR, no murmurs, rubs, or gallops; no JVD PULM: CTAB, normal effort ABD: Soft, nontender, nondistended, no organomegaly; obese SKIN: No rash or cyanosis; warm and well-perfused EXTR: No lower extremity edema or calf tenderness PSYCH: Mood and affect euthymic, normal rate and volume of speech NEURO: Awake, alert, no focal deficits grossly, normal speech    Assessment:     Stoy Fenn is a 63 y.o. male with h/o Diabetes Mellitus here for follow-up of high A1c.    Plan:     # See problem list and after visit summary for problem-specific plans.  # Health Maintenance: Not discussed  Follow-up: Follow up with phone call  of blood sugar readings and whether or not on insulin.    [name], MD [place]

## 2013-11-16 NOTE — Patient Instructions (Addendum)
Good to see you today!  For your diabetes, with your high A1c, I would like to start you on insulin. - Find out the study name, phone number, and drug you are taking and call us with this information please. - Call me with your blood sugar readings (date, fasting (time), and other three values (times, how long after food). - If you are not already on insulin, we need to discuss starting this. - Meet with Iver Nestle (call her) to discuss diabetes and diet. - I will refer you to cardiology to follow back up with them, and to podiatry so you can get diabetic shoes.  [name], MD

## 2013-11-17 ENCOUNTER — Telehealth: Payer: Self-pay | Admitting: Family Medicine

## 2013-11-17 DIAGNOSIS — E785 Hyperlipidemia, unspecified: Secondary | ICD-10-CM

## 2013-11-17 DIAGNOSIS — E119 Type 2 diabetes mellitus without complications: Secondary | ICD-10-CM

## 2013-11-17 DIAGNOSIS — E669 Obesity, unspecified: Secondary | ICD-10-CM

## 2013-11-17 NOTE — Assessment & Plan Note (Signed)
-   Patient plans to follow-up with Dr Katy Fitch in 2 weeks.

## 2013-11-17 NOTE — Assessment & Plan Note (Signed)
Pt with chronic intermittent chest pain. Has seen Dr Johnsie Cancel in the past. Asking for referral which their office stated he needs. - Referral placed. - Would like pt to have annual checkups there given h/o CAD, cardiomyopathy, DM, and HTN.

## 2013-11-17 NOTE — Telephone Encounter (Signed)
Viagra: Also called and let patient know not to take viagra with nitroglycerin, and asked him to let medical providers know if he had chest pain and had recently taken viagra, what dose and what time he had taken it. He voiced understanding.  Diabetes: Patient was unable to reach Dr Jenne Campus today. I will forward this to her and ask if she could try contacting him.  Also, Patient gave me information of study coordinator. He is not sure if he is taking insulin now but thinks he is starting it in 1-2 weeks. He is not sure what he is on now. I will need to call her during business hours:  Carole Civil [phone] Fax [phone]   [name], MD

## 2013-11-17 NOTE — Assessment & Plan Note (Signed)
Poorly controlled. Patient reports being in some study where he injects medication daily and checks BG four times daily.  - Asked pt to call me with sudy name, phone number, and name of drug taking (and dose). - If not on insulin, need to start. Would have pt meet with Dr Valentina Lucks for diabetic teaching if so. - Asked pt to call me with glucose readings (date, fasting, and three other values with times and how long after food.) - BP needs better control for diabetic. F/u at next visit. - Gave Dr Jenne Campus card. Asked to set up appt for diet advice. - Referred to podiatry so patient can be evaluated for diabetic shoes. - Filled out diabetic supply form. - Need to eval lipids at f/u.

## 2013-11-17 NOTE — Telephone Encounter (Signed)
Pt is aware of this and will come by and pick up. Jazmin Hartsell,CMA

## 2013-11-17 NOTE — Telephone Encounter (Signed)
Error

## 2013-11-17 NOTE — Telephone Encounter (Addendum)
Please call patient to let him know that sildenafil has arrived and they sent 30 tabs instead of 10. I still want him to use carefully as prescribed. I will leave medication up front with his name on it. Tracking number/item no [id number].   Blake Sinclair, MD

## 2013-11-18 NOTE — Addendum Note (Signed)
Addended by: Conni Slipper T on: 11/18/2013 04:51 PM   Modules accepted: Orders

## 2013-11-19 ENCOUNTER — Telehealth: Payer: Self-pay | Admitting: Family Medicine

## 2013-11-19 NOTE — Telephone Encounter (Signed)
Will forward to MD Coreena Rubalcava Dawn   

## 2013-11-19 NOTE — Telephone Encounter (Signed)
[facility] received a fax from [place] for Blake Wilson's diabetic supplies and parts of the form where cut off. Can we re-fax this again. jw

## 2013-11-19 NOTE — Telephone Encounter (Signed)
He states he having pain & difficulty walking. Please advise

## 2013-11-19 NOTE — Telephone Encounter (Signed)
Okay, I just refaxed it. Conni Slipper MD

## 2013-11-19 NOTE — Telephone Encounter (Signed)
Left message on voicemail for patient to call back. When he calls, please ask him his indication for handicapped placard. He could truly benefit from walking as much as possible, so I would like him to continue doing so if possible, to help his blood pressure and weight. However, if it is reasonable due to other cause, I can certainly consider it.  [name], MD

## 2013-11-19 NOTE — Telephone Encounter (Signed)
Patient dropped off form to be filled out for handicapped placard.  Please call when completed.

## 2013-11-22 NOTE — Telephone Encounter (Addendum)
Called phone number provided for Carole Civil with [facility]. Spoke with Felton Clinton PA. She reported that Mr Kleve is a part of the [employer] Diabetes Study, protocol number [id number].  She states that Mr Parfait is currently injecting placebo daily and tomorrow will be randomized to "active drug" with either victoza or an investigational drug that she could not tell me the name of (number [id number]). He will be in study for 12 weeks. Study parameters include internal monitoring with a "medical monitor." While on medication if blood sugar increases or decreases, they will get notification and can put him on a "rescue medication." A1c is blinded to them. He will visit them q2 weeks and have blood draws.   They request medical records because patient's do not always know what medications they are on. They also ask patient's for pharmacy information so they can obtain records, and ask him to bring in his medications. She states he called to get enrolled and was either referred by a friend or saw an advertisement. Pt gets paid for participation in trial. Once he is randomized, he will get a card stating what he is on so that he can show it if he ever has to go to the ED.   Will discuss this with our pharmacist to better understand my role in this trial and how best to manage Mr [place] health, as we had been discussing possibly starting insulin.   [name], MD

## 2013-11-22 NOTE — Telephone Encounter (Signed)
Called to discuss with patient. He states his feet hurt badly sometimes and feel like they are burning. He also occasionally has dyspnea walking long distances. I encouraged walking as much as possible except when limited by pain. We came to an agreement that we could try a temporary handicapped placard for 2 months but I also wanted him walking when possible, as better control of his diabetes and HTN will help his symptoms most of all.  Filling out form and pt to come pick up.  [name], MD

## 2013-11-24 ENCOUNTER — Ambulatory Visit: Payer: Self-pay | Admitting: Podiatry

## 2013-11-26 ENCOUNTER — Telehealth: Payer: Self-pay | Admitting: Family Medicine

## 2013-11-26 NOTE — Telephone Encounter (Signed)
In Order to [facility] approve nutrition referral they need to know if this is: Diabetes Education? In this case pt needs to call customer services and ask them if it is cover by his INS.  Nutrition Education ? It has to be under other code G0109?   Please let me know the referral is in hold, they don't go to process the referral until we clarify.   [place]

## 2013-12-02 NOTE — Telephone Encounter (Signed)
This is diabetes education for Blake Wilson. Can you let him know to call his customer service and ask them if it is covered by his insurance?  [name], MD

## 2013-12-10 ENCOUNTER — Ambulatory Visit: Payer: Medicare HMO | Admitting: Cardiovascular Disease

## 2013-12-22 ENCOUNTER — Ambulatory Visit (INDEPENDENT_AMBULATORY_CARE_PROVIDER_SITE_OTHER): Payer: Commercial Managed Care - HMO | Admitting: Podiatry

## 2013-12-22 ENCOUNTER — Encounter: Payer: Self-pay | Admitting: Podiatry

## 2013-12-22 VITALS — BP 141/79 | HR 82 | Ht 72.0 in | Wt 223.0 lb

## 2013-12-22 DIAGNOSIS — E1140 Type 2 diabetes mellitus with diabetic neuropathy, unspecified: Secondary | ICD-10-CM | POA: Insufficient documentation

## 2013-12-22 DIAGNOSIS — M79606 Pain in leg, unspecified: Secondary | ICD-10-CM

## 2013-12-22 DIAGNOSIS — M7742 Metatarsalgia, left foot: Secondary | ICD-10-CM

## 2013-12-22 DIAGNOSIS — B351 Tinea unguium: Secondary | ICD-10-CM

## 2013-12-22 DIAGNOSIS — M79609 Pain in unspecified limb: Secondary | ICD-10-CM

## 2013-12-22 DIAGNOSIS — E1142 Type 2 diabetes mellitus with diabetic polyneuropathy: Secondary | ICD-10-CM

## 2013-12-22 DIAGNOSIS — M216X9 Other acquired deformities of unspecified foot: Secondary | ICD-10-CM

## 2013-12-22 DIAGNOSIS — M7741 Metatarsalgia, right foot: Secondary | ICD-10-CM | POA: Insufficient documentation

## 2013-12-22 DIAGNOSIS — M7750 Other enthesopathy of unspecified foot: Secondary | ICD-10-CM

## 2013-12-22 DIAGNOSIS — E119 Type 2 diabetes mellitus without complications: Secondary | ICD-10-CM

## 2013-12-22 DIAGNOSIS — E1149 Type 2 diabetes mellitus with other diabetic neurological complication: Secondary | ICD-10-CM

## 2013-12-22 HISTORY — DX: Tinea unguium: B35.1

## 2013-12-22 HISTORY — DX: Other acquired deformities of unspecified foot: M21.6X9

## 2013-12-22 HISTORY — DX: Metatarsalgia, left foot: M77.42

## 2013-12-22 HISTORY — DX: Pain in leg, unspecified: M79.606

## 2013-12-22 HISTORY — DX: Metatarsalgia, right foot: M77.41

## 2013-12-22 NOTE — Progress Notes (Signed)
Subjective: 63 year old male presents complaining of bilateral foot pain. Pain is in bottom of both feet aggravated by weight bearing duration of 3-4 years. He is on disability for 7 years.  Toes tingle and pain in bottom of feet at outer surface. A1c was at 10 in January 2015.   Review of Systems - General ROS: negative for - chills, fatigue, fever, night sweats, sleep disturbance, weight gain or weight loss Ophthalmic ROS: Have glaucoma and being treated.  ENT ROS: negative Allergy and Immunology ROS: negative Respiratory ROS: no cough, shortness of breath, or wheezing Cardiovascular ROS: no chest pain or dyspnea on exertion Gastrointestinal ROS: no abdominal pain, change in bowel habits, or black or bloody stools Genito-Urinary ROS: no dysuria, trouble voiding, or hematuria Musculoskeletal ROS: Occasional knee pain due to arthritis. Neurological ROS: no TIA or stroke symptoms Dermatological ROS: negative  Objective: Dermatologic: Thick circular plantar callus under 5th MPJ bilateral. Dystrophic nails x 10. Vascular: Pedal pulses faintly palpable in both DP and PT bilateral. No edema or erythema noted.  Orthopedic: Tight Achilles tendon with or without knee extended bilateral. Contracted digits 1-5 bilateral.  Mild elevation of the first ray bilateral. Neurologic: Diminished epicritic sensation, diminished sensory perception to Monofilament wire testing.  Radiographic: Osteopenia through out the foot.  Significant for elevated first ray bilateral.  Assessment: Diabetic neuropathy both feet. Ankle equinus bilateral. Forefoot varus with elevated first ray bilateral. Metatarsalgia, lesser 2-5 bilateral. Mycotic nails x 10.  Plan: Reviewed clinical findings and available options. Patient is to keep blood sugar under control, do stretch exercise for tight achilles tendon, and wear proper shoe gear.  Continue to take medication as prescribed. All nails debrided. Return in 3 month.

## 2013-12-22 NOTE — Patient Instructions (Signed)
Seen for painful feet.  Noted of weakened neurovascular status, tight achilles tendon and change in bone position. May benefit from keeping blood sugar under control, continuing with nerve medication as needed, daily stretch exercise for tight achilles tendon, proper shoe gear and orthotics, and periodic nail care.  Return in 3 month or sooner as needed.

## 2013-12-23 ENCOUNTER — Ambulatory Visit: Payer: Medicare HMO | Admitting: Cardiovascular Disease

## 2013-12-28 ENCOUNTER — Ambulatory Visit (INDEPENDENT_AMBULATORY_CARE_PROVIDER_SITE_OTHER): Payer: Medicare HMO | Admitting: Family Medicine

## 2013-12-28 ENCOUNTER — Encounter: Payer: Self-pay | Admitting: Family Medicine

## 2013-12-28 VITALS — Ht 72.0 in | Wt 202.2 lb

## 2013-12-28 DIAGNOSIS — E119 Type 2 diabetes mellitus without complications: Secondary | ICD-10-CM

## 2013-12-28 NOTE — Patient Instructions (Addendum)
  Diet Recommendations for Diabetes   Starchy (carb) foods include: Bread, rice, pasta, potatoes, corn, crackers, bagels, muffins, all baked goods.  (Fruits, milk, and yogurt also have carbohydrate, but most of these foods will not spike your blood sugar as the starchy foods will.)  A few fruits do cause high blood sugars; use small portions of bananas (limit to 1/2 at a time), grapes, and most tropical fruits.    Protein foods include: Meat, fish, poultry, eggs, dairy foods, and beans such as pinto and kidney beans (beans also provide carbohydrate).   1. Eat at least 3 meals and 1-2 snacks per day. Never go more than 4-5 hours while awake without eating.  2. Limit starchy foods to TWO per meal and ONE per snack. ONE portion of a starchy  food is equal to the following:   - ONE slice of bread (or its equivalent, such as half of a hamburger bun).   - 1/2 cup of a "scoopable" starchy food such as potatoes or rice.   - 15 grams of carbohydrate as shown on food label.  3. Both lunch and dinner should include a protein food, a carb food, and vegetables.   - Obtain twice as many veg's as protein or carbohydrate foods for both lunch and dinner.   - Fresh or frozen veg's are best.   - Try to keep frozen veg's on hand for a quick vegetable serving.    4. Breakfast should always include protein.   5. [facility]  If you can add another few minutes of walking, the best time for your blood sugar control will be after dinner.    - While taking your study medication, BE SURE TO EAT BREAKFAST DAILY.

## 2013-12-28 NOTE — Progress Notes (Signed)
Medical Nutrition Therapy:  Appt start time: 1330 end time:  1430.  Assessment:  Primary concerns today: Blood sugar control.  Please note:  [name] has spoken to a representative at [facility], and it is his understanding that MNT is a covered service of his policy.  I was notified by [employer], however, that a [employer] representative told her this should be billed as G0109 - which makes no sense b/c that is a code for group appt for diabetes self-mgmt training (DSMT).  I will submit usual [phone] code for billing, and re-submit as necessary per [facility].    Mr. Niznik is taking Novolog each morning pre-bkfst as part of his study medication.  He was warned to make sure he eats breakfast daily following medication.  He has complied with that recommendation, and has not experienced any hypoglycemia.   Usual eating pattern includes 2-3 meals and 1-2 snacks per day. Usual physical activity includes 1-2 hrs TM &/or [employer] 3 X wk.  Day's activity usually includes walking some in the neighborhood.  (Not sure of intensity of [employer] activity.)   Frequent foods include 1 c coffee w/ cream & artif swntr, 12 oz diet soda, water, cereal (Sp K/cornflakes/Cheerios/oatmeal, 1/2 c 2% milk).  Lives alone; prepares his own food; eats out only ~2 X mo.   24-hr recall: (Up at 6:30 AM) B (7 AM)-   1 c coffee w/ cream & artif swntr Snk ( AM)-    L (11:30 PM)-  1 c canned chx noodle soup or sandwich; can't remember Snk ( PM)-   D (5 PM)-  2 c ground beef casserole w/ rice & potatoes, 1 c broccoli, diet soda Snk ( PM)-   [name] said yesterday was a typical day, although this intake does not suggest an A1C of 10 or a weight of 220 lb, so reason to doubt accuracy of recall.    Progress Towards Goal(s):  In progress.   Nutritional Diagnosis:  [place]-2.2 Altered nutrition-related laboratory As related to glucose control.  As evidenced by A1C of 10.    Intervention:  Nutrition education.  Monitoring/Evaluation:   Dietary intake, exercise, and body weight prn.  [name] said he would prefer to follow up w/ Dr. Dianah Field, and if she and he think he needs further specific MNT or DSMT, Dr. Darene Lamer will refer as appropriate.  (He did receive training at [facility] when first diagnosed w/ DM several yrs ago, but apparent knowledge level w/ respect to eating to optimize glucose control is still relatively low.)

## 2014-01-10 ENCOUNTER — Telehealth: Payer: Self-pay | Admitting: Family Medicine

## 2014-01-10 NOTE — Telephone Encounter (Signed)
Please let him know our office does not typcally handle the collecting and dispensing of prescriptions (that was my mistake last time that I was made aware of after). He will need to either contact the company for med assistance or his pharmacy or the MAP program at [facility].  ThxHilton Sinclair, MD

## 2014-01-10 NOTE — Telephone Encounter (Signed)
Needs viagra refilled Says he thought he picked it up here?

## 2014-01-11 MED ORDER — SILDENAFIL CITRATE 50 MG PO TABS: 50.0000 mg | ORAL_TABLET | Freq: Every day | ORAL | Status: DC | PRN

## 2014-01-11 NOTE — Telephone Encounter (Signed)
Sent.  Thank you.  [name], MD

## 2014-01-11 NOTE — Telephone Encounter (Signed)
Pt is aware of this and would like his rx sent to [facility].  Please advise. Jazmin Hartsell,CMA

## 2014-01-12 ENCOUNTER — Ambulatory Visit: Payer: Medicare HMO | Admitting: Cardiovascular Disease

## 2014-01-28 ENCOUNTER — Ambulatory Visit (INDEPENDENT_AMBULATORY_CARE_PROVIDER_SITE_OTHER): Payer: Commercial Managed Care - HMO | Admitting: Cardiovascular Disease

## 2014-01-28 ENCOUNTER — Encounter: Payer: Self-pay | Admitting: Cardiovascular Disease

## 2014-01-28 VITALS — BP 128/78 | HR 94 | Ht 72.0 in | Wt 219.8 lb

## 2014-01-28 DIAGNOSIS — I428 Other cardiomyopathies: Secondary | ICD-10-CM

## 2014-01-28 DIAGNOSIS — Z79899 Other long term (current) drug therapy: Secondary | ICD-10-CM

## 2014-01-28 DIAGNOSIS — E785 Hyperlipidemia, unspecified: Secondary | ICD-10-CM

## 2014-01-28 DIAGNOSIS — I429 Cardiomyopathy, unspecified: Secondary | ICD-10-CM

## 2014-01-28 DIAGNOSIS — I10 Essential (primary) hypertension: Secondary | ICD-10-CM

## 2014-01-28 DIAGNOSIS — E119 Type 2 diabetes mellitus without complications: Secondary | ICD-10-CM

## 2014-01-28 LAB — BASIC METABOLIC PANEL
BUN: 15 mg/dL (ref 6–23)
CO2: 28 mEq/L (ref 19–32)
Calcium: 9.3 mg/dL (ref 8.4–10.5)
Chloride: 100 mEq/L (ref 96–112)
Creatinine, Ser: 1.1 mg/dL (ref 0.4–1.5)
GFR: 87.81 mL/min (ref >60.00)
GLUCOSE: 112 mg/dL — AB (ref 70–99)
POTASSIUM: 4.3 meq/L (ref 3.5–5.1)
SODIUM: 136 meq/L (ref 135–145)

## 2014-01-28 LAB — BRAIN NATRIURETIC PEPTIDE: Pro B Natriuretic peptide (BNP): 11 pg/mL (ref 0.0–100.0)

## 2014-01-28 MED ORDER — CARVEDILOL 12.5 MG PO TABS: 12.5000 mg | ORAL_TABLET | Freq: Two times a day (BID) | ORAL | Status: DC

## 2014-01-28 NOTE — Assessment & Plan Note (Signed)
Previously deemed nonischemic  Noncompliant with beta blocker with relative tachycardia  Restart coreg at 12.5 bid.  F/U echo to see if EF has declined.  Discussed importance of DM control, low sodium diet and med compliance with him

## 2014-01-28 NOTE — Assessment & Plan Note (Signed)
Discussed low carb diet.  Target hemoglobin A1c is 6.5 or less.  Continue current medications.  

## 2014-01-28 NOTE — Assessment & Plan Note (Addendum)
Cholesterol is not at goal.  Pretty clear that he has not been taking his statin  Again discussed compliance  Lab Results  Component Value Date   LDLCALC 182* 10/25/2013

## 2014-01-28 NOTE — Patient Instructions (Signed)
Your physician recommends that you schedule a follow-up appointment in:  [place] has recommended you make the following change in your medication:  RESTART CARVEDILOL   AT  12 .5 MG    TWICE DAILY  Your physician recommends that you return for lab work in:  [place] has requested that you have an echocardiogram. Echocardiography is a painless test that uses sound waves to create images of your heart. It provides your doctor with information about the size and shape of your heart and how well your heart's chambers and valves are working. This procedure takes approximately one hour. There are no restrictions for this procedure.

## 2014-01-28 NOTE — Progress Notes (Signed)
Patient ID: Blake Wilson, male   DOB: 04/13/1951, 63 y.o.   MRN: [id number] Wilson is seen today at the request of Dr Thekkekandam, He has not been seen her in over 3 years  History of nonischemic DCM with HTN and elevated lipids.  He is doing well with no SOB, palpitations, edema or SSCP. His last echo in 09/26/11 which I reviewed showed mild LVE with mild diffuse hypokinesis EF 50-55% and mild AR and trivial MR.  He is not working but says he goes to a gym 2-3 times / week and "walks around" It is clear that he has little insight into his disease, meds and compliance  BS poorly controlled and should be on insulin  Not taking coreg at all and not clear to me that he is taking his other BP meds regularly  Has some blurry vision and needs to f/u with ophthalmologist  No recent evaluation of renal function or BNP      ROS: Denies fever, malais, weight loss, blurry vision, decreased visual acuity, cough, sputum, SOB, hemoptysis, pleuritic pain, palpitaitons, heartburn, abdominal pain, melena, lower extremity edema, claudication, or rash.  All other systems reviewed and negative  General: Affect appropriate Healthy:  appears stated age HEENT: normal Neck supple with no adenopathy JVP normal no bruits no thyromegaly Lungs clear with no wheezing and good diaphragmatic motion Heart:  S1/S2 no murmur, no rub, gallop or click PMI normal Abdomen: benighn, BS positve, no tenderness, no AAA no bruit.  No HSM or HJR Distal pulses intact with no bruits No edema Neuro non-focal Skin warm and dry No muscular weakness   Current Outpatient Prescriptions  Medication Sig Dispense Refill  . aspirin 81 MG tablet Take 81 mg by mouth daily. After a meal       . atorvastatin (LIPITOR) 80 MG tablet Take 1 tablet (80 mg total) by mouth at bedtime.  30 tablet  12  . Blood Glucose Monitoring Suppl (BLOOD GLUCOSE METER) kit Check sugars twice daily.  1 each  12  . carvedilol (COREG) 25 MG tablet Take 1 tablet (25  mg total) by mouth 2 (two) times daily.  180 tablet  4  . furosemide (LASIX) 40 MG tablet Take 1 tablet (40 mg total) by mouth daily.  30 tablet  5  . gabapentin (NEURONTIN) 300 MG capsule Take 1-3 tablets qhs as needed for lower extremity pain.  90 capsule  3  . Lancets MISC USE TWICE DAILY TO CHECK BLOOD SUGAR  200 each  0  . lisinopril (PRINIVIL,ZESTRIL) 40 MG tablet Take 1 tablet (40 mg total) by mouth daily.  90 tablet  4  . metFORMIN (GLUCOPHAGE) 1000 MG tablet Take 1 tablet (1,000 mg total) by mouth 2 (two) times daily with a meal.  60 tablet  11  . nitroGLYCERIN (NITROSTAT) 0.4 MG SL tablet Place 1 tablet (0.4 mg total) under the tongue every 5 (five) minutes as needed for chest pain.  20 tablet  3  . sildenafil (VIAGRA) 50 MG tablet Take 1 tablet (50 mg total) by mouth daily as needed for erectile dysfunction. Hold if taken nitroglycerin or for BP<120/80.  10 tablet  2  . travoprost, benzalkonium, (TRAVATAN) 0.004 % ophthalmic solution Place 1 drop into both eyes at bedtime.       . [DISCONTINUED] sitaGLIPtin (JANUVIA) 100 MG tablet Take 1 tablet (100 mg total) by mouth daily.  30 tablet  3   No current facility-administered medications for this visit.      Allergies  Review of patient's allergies indicates no known allergies.  Electrocardiogram:  SR rate 94 LAD LVH ? Old IMI   Assessment and Plan

## 2014-01-28 NOTE — Assessment & Plan Note (Signed)
Well controlled.  Continue current medications and low sodium Dash type diet.    

## 2014-02-04 ENCOUNTER — Ambulatory Visit (HOSPITAL_COMMUNITY): Payer: Medicare HMO | Attending: Cardiology | Admitting: Radiology

## 2014-02-04 DIAGNOSIS — I428 Other cardiomyopathies: Secondary | ICD-10-CM | POA: Insufficient documentation

## 2014-02-04 DIAGNOSIS — I429 Cardiomyopathy, unspecified: Secondary | ICD-10-CM

## 2014-02-04 NOTE — Progress Notes (Signed)
Echocardiogram performed.  

## 2014-02-07 ENCOUNTER — Encounter: Payer: Self-pay | Admitting: Family Medicine

## 2014-02-07 ENCOUNTER — Encounter: Payer: Self-pay | Admitting: *Deleted

## 2014-02-07 NOTE — Progress Notes (Signed)
I was informed a few months ago that our clinic has stopped doing these pharmacy programs that have to be filled through provider office. Unfortunately, it is clinic policy. I had discussed this with Blake Wilson a few months ago but he may have forgotten. Please let him know that we are no longer able to do this and med refills have to go through his pharmacy or other programs such as the [place] [facility].  Thanks,  [name], MD

## 2014-02-07 NOTE — Progress Notes (Signed)
Pt in RN office today with a letter from [employer].  Pt stated the he received Viagra tab free of charge from their program.  Medications where sent to provider's office.  Pt request that provider to review letter and to resend refill for Viagra to [facility].  Copy of letter placed in provider box for review. Derl Barrow, RN

## 2014-02-07 NOTE — Progress Notes (Signed)
Patient ID: Blake Wilson, male   DOB: [birth year], 63 y.o.   MRN: [id number]  Received labs from [place] with UA with WBC 3-5, RBC 6-9, and occasional hyaline casts.   Unlikely significant, mildly above reference range. With no symptoms, would repeat in 3 months or sooner if symptomatic. Possibly related to diabetic nephropathy.  [name], MD

## 2014-02-08 ENCOUNTER — Telehealth: Payer: Self-pay | Admitting: Cardiovascular Disease

## 2014-02-08 ENCOUNTER — Telehealth: Payer: Self-pay | Admitting: *Deleted

## 2014-02-08 MED ORDER — SILDENAFIL CITRATE 50 MG PO TABS: 50.0000 mg | ORAL_TABLET | Freq: Every day | ORAL | Status: DC | PRN

## 2014-02-08 NOTE — Telephone Encounter (Signed)
Informed pt about medication that was requested to be sent to [facility] can not be done due to [employer].  Pt stated understanding.  Pt requested refill of Viagra be sent to pharmacy on file.  Derl Barrow, RN

## 2014-02-08 NOTE — Telephone Encounter (Signed)
Refilled and sent rx to pharmacy on file.  [name], MD

## 2014-02-08 NOTE — Telephone Encounter (Signed)
Pt is aware of rx refill.  Jazmin Hartsell,CMA

## 2014-02-08 NOTE — Telephone Encounter (Signed)
Walk in Pt Form " [place]" paper Dropped Off gave to [facility]

## 2014-02-10 ENCOUNTER — Telehealth: Payer: Self-pay | Admitting: *Deleted

## 2014-02-10 NOTE — Telephone Encounter (Signed)
Order to [facility] for patients VIAGRA 50 mg tablet Patient approval expires 10/20/2014  PATIENT ID [id number] AUTOMATED PHONE SYSTEM FOR RE ORDERING [phone]  THIS COMPLETED ORDER # IS [id number]

## 2014-02-18 ENCOUNTER — Telehealth: Payer: Self-pay

## 2014-02-18 NOTE — Telephone Encounter (Signed)
Call patient to let him know that his med was here at the office

## 2014-02-22 ENCOUNTER — Other Ambulatory Visit: Payer: Self-pay | Admitting: Family Medicine

## 2014-02-22 NOTE — Progress Notes (Signed)
Patient ID: Blake Wilson, male   DOB: 06/23/1951, 63 y.o.   MRN: [id number]  Received documentation from [facility] that patient's calcitonin is 16.9 02/11/14.  They are planning to repeat test.  Will await repeat.  [name], MD

## 2014-03-10 ENCOUNTER — Ambulatory Visit: Payer: Medicare HMO | Admitting: Family Medicine

## 2014-03-18 LAB — TSH: TSH: 0.08 u[IU]/mL — AB (ref <=5.90)

## 2014-03-21 ENCOUNTER — Telehealth: Payer: Self-pay | Admitting: *Deleted

## 2014-03-21 NOTE — Telephone Encounter (Signed)
Patient came into office requesting a viagra refill. He actually needs it called into [facility].   PATIENT ID [id number]  AUTOMATED PHONE [place] [phone]  THIS COMPLETED ORDER # IS [id number]  It is too soon for this to be reordered and the automated system stated that it would not be processed until April 16, 2014. Patient aware.

## 2014-03-23 ENCOUNTER — Telehealth: Payer: Self-pay | Admitting: *Deleted

## 2014-03-23 NOTE — Telephone Encounter (Signed)
Pt came in today stating that he has an appt on Friday at [facility] and they need [place] to call [employer] regarding this referral.  Informed patient that we faxed over [employer] form in January.  Will forward to referral coordinator, [place] and see if she has seen anything regarding this patient's referral. Jaidyn Kuhl,CMA

## 2014-03-24 NOTE — Telephone Encounter (Signed)
Pt will come to see Dr. Dianah Field on 04/01/2014 and he will request a new referral to go back to [facility]. [employer] approved 4 visits on Jan for pt appt on 12/22/2013 but, referral  expires on 03/24/2014. I spoke with pt and he did understand the process .   [place]

## 2014-03-25 ENCOUNTER — Ambulatory Visit: Payer: Commercial Managed Care - HMO | Admitting: Podiatry

## 2014-04-01 ENCOUNTER — Ambulatory Visit (INDEPENDENT_AMBULATORY_CARE_PROVIDER_SITE_OTHER): Payer: Medicare HMO | Admitting: Family Medicine

## 2014-04-01 ENCOUNTER — Encounter: Payer: Self-pay | Admitting: Family Medicine

## 2014-04-01 VITALS — BP 160/81 | HR 63 | Temp 97.6°F | Wt 224.0 lb

## 2014-04-01 DIAGNOSIS — M25579 Pain in unspecified ankle and joints of unspecified foot: Secondary | ICD-10-CM

## 2014-04-01 DIAGNOSIS — E119 Type 2 diabetes mellitus without complications: Secondary | ICD-10-CM

## 2014-04-01 DIAGNOSIS — E785 Hyperlipidemia, unspecified: Secondary | ICD-10-CM

## 2014-04-01 DIAGNOSIS — I5022 Chronic systolic (congestive) heart failure: Secondary | ICD-10-CM | POA: Insufficient documentation

## 2014-04-01 DIAGNOSIS — I5020 Unspecified systolic (congestive) heart failure: Secondary | ICD-10-CM

## 2014-04-01 DIAGNOSIS — I10 Essential (primary) hypertension: Secondary | ICD-10-CM

## 2014-04-01 DIAGNOSIS — I428 Other cardiomyopathies: Secondary | ICD-10-CM

## 2014-04-01 HISTORY — DX: Pain in unspecified ankle and joints of unspecified foot: M25.579

## 2014-04-01 LAB — POCT GLYCOSYLATED HEMOGLOBIN (HGB A1C): Hemoglobin A1C: 6.9

## 2014-04-01 MED ORDER — LIRAGLUTIDE 18 MG/3ML ~~LOC~~ SOPN: 0.6000 mg | PEN_INJECTOR | Freq: Every day | SUBCUTANEOUS | Status: DC

## 2014-04-01 NOTE — Assessment & Plan Note (Signed)
Worsened heart failure per 01/2014 echocardiogram compared to 2012. EF 25-30%. Likely worsened by chronic poor DM control. - continue current meds and dash diet - Improve DM and HTN control.Goal still 140/90 - F/u with Dr Johnsie Cancel in 1 mo.

## 2014-04-01 NOTE — Patient Instructions (Addendum)
For your diabetes,  Restart victoza per instructions and follow up with me in 3 mo or sooner if needed. Continue to check blood sugar every morning and write this down for me. Call me in 1 month with these numbers. I made a new referral for foot center.  Go back to see Dr Jenne Campus. Keep a dietary journal for her for 2-3 normal days.  Come back in fasting for a lipid panel. Your BP here is 160/81.  Be sure to take your BP meds every day. Check BP at a local drug store and call me with 3 values so I can decide if we need to increase medication.  Wear compression stockings for your leg swelling.

## 2014-04-01 NOTE — Progress Notes (Signed)
Patient ID: Blake Wilson, male   DOB: 04/24/1951, 63 y.o.   MRN: [id number] Subjective:   CC: Follow up DM and HTN  HPI:   DM Was on victoza for 12 weeks through a research study. This ended early May and he is now just on metformin which he reports taking daily. Saw Dr Katy Fitch 2 mo ago who did diabetic eye exam. A1c last Jan 10.6 Taking metformin daily. Home glucose runs 130s - 230s. No anxiety/diaphoresis. Has polyuria at baseline while on lasix, and drinking more during summer months. No chest pain, new dyspnea, dizziness, syncope, or lightheadedness.  Had 1 appt with Dr Jenne Campus (nutrition), and knowledge still low, she doubted accuracy of his recall. He agrees with this assessment. Saw podiatry, who dx'ed tight achilles tendon and recommended protective shoes; debrided nails; planned f/u this mo Is on lipitor but compliance an issue in the past and not at goal in January.  Cardiomyopathy and heart failure Has some baseline dyspnea. Saw Dr Johnsie Cancel who performed echocardiogram 01/2014 showing mild LVH, EF 25-30%, diffuse hypokinesis, and grade I diastolic dysfunction, worse compared to 09/2011 study. Has f/u next mo. Taking coreg BID, aspirin, lisinopril, and lasix as prescribed.  HTN Patient does not check at home. No cuff at home. Denies chest pain, leg swelling, new dyspnea, dizziness or syncope. Takes lasix, coreg, lisinopril regularly.   Review of Systems - Per HPI.   PMH: meds reviewd Smoking status: Former smoker    Objective:  Physical Exam BP 160/81  Pulse 63  Temp(Src) 97.6 F (36.4 C) (Oral)  Wt 224 lb (101.606 kg) GEN: NAD HEENT: Atraumatic, normocephalic, neck supple, EOMI, sclera clear  CV: RRR, no murmurs, rubs, or gallops; 1+ DP pulse bilaterally PULM: CTAB, normal effort ABD: Soft, nontender SKIN: No rash or cyanosis; warm and well-perfused EXTR: Trace lower extremity edema; no calf tenderness PSYCH: Mood and affect euthymic, normal rate and volume  of speech NEURO: Awake, alert, no focal deficits grossly, normal speech.     Assessment:     Blake Wilson is a 63 y.o. male with h/o DM, dilated CM, systolic CHF and HTN here for follow up.    Plan:     Diabetes A1c to 6.9 from 10.6, due in part to victoza and also possibly in part to tighter dietary control. Taking just metformin daily. Blood sugars running 130-230s. - Restart victoza at 0.6mg  daily (sent to pt's mail order pharmacy of his choosing) and increase after 1 week to 1.2 mg daily. - Call in 1 month with fasting blood sugar values. - More fu/ with Dr Jenne Campus for better knowledge. Due to prior problems with recall, asked pt to keep journal of his 3 most typical days, including all intake including liquids and condiments. - Future lab appt for lipid panel when he is fasting. - Obtain ophtho records (Dr Katy Fitch). - Reordered podiatry referral per pt request.  Cardiomyopathy and heart failure Worsened heart failure per 01/2014 echocardiogram compared to 2012. EF 25-30%. Likely worsened by chronic poor DM control. - continue current meds and dash diet - Improve DM and HTN control.Goal still 140/90 - F/u with Dr Johnsie Cancel in 1 mo.  HTN Goal <140/90 with h/o DM. Poorly controlled. Taking medications reportedly as prescribed. Sees cardiology. Previously some concern for noncompliance. - Continue current regimen. Emphasized taking as prescribed unless dizzy/syncope. - Check outside clinic to eval for white coat HTN and call with these values to determine if medication change appropriate. - Compression socks for  LE trace edema. - Return precautions reviewed.  # Health Maintenance: not discussed  Follow-up: Follow up in 1 month with phone call with blood sugar readings on victoza. F/u with me dependent on this and called in BP values.    [name], MD [place]

## 2014-04-01 NOTE — Assessment & Plan Note (Signed)
A1c to 6.9 from 10.6, due in part to victoza and also possibly in part to tighter dietary control. Taking just metformin daily. Blood sugars running 130-230s. - Restart victoza at 0.6mg  daily (sent to pt's mail order pharmacy of his choosing) and increase after 1 week to 1.2 mg daily. - Call in 1 month with fasting blood sugar values. - More fu/ with Dr Jenne Campus for better knowledge. Due to prior problems with recall, asked pt to keep journal of his 3 most typical days, including all intake including liquids and condiments. - Future lab appt for lipid panel when he is fasting. - Obtain ophtho records (Dr Katy Fitch). - Reordered podiatry referral per pt request.

## 2014-04-01 NOTE — Assessment & Plan Note (Signed)
Goal <140/90 with h/o DM. Poorly controlled. Taking medications reportedly as prescribed. Sees cardiology. Previously some concern for noncompliance. - Continue current regimen. Emphasized taking as prescribed unless dizzy/syncope. - Check outside clinic to eval for white coat HTN and call with these values to determine if medication change appropriate. - Compression socks for LE trace edema. - Return precautions reviewed.

## 2014-04-25 ENCOUNTER — Telehealth: Payer: Self-pay | Admitting: *Deleted

## 2014-04-25 NOTE — Telephone Encounter (Signed)
Called patient to let him know that his viagra had came in from [facility]. I will place at the front desk for pick up.

## 2014-04-28 ENCOUNTER — Ambulatory Visit: Payer: Medicare HMO | Admitting: Cardiovascular Disease

## 2014-04-29 ENCOUNTER — Encounter: Payer: Self-pay | Admitting: Family Medicine

## 2014-04-29 ENCOUNTER — Telehealth: Payer: Self-pay | Admitting: Family Medicine

## 2014-04-29 NOTE — Telephone Encounter (Signed)
LMOVM for pt to return call .Jailen Coward Dawn  

## 2014-04-29 NOTE — Telephone Encounter (Signed)
Blue team, I received a note from [facility] that Mr Kassa TSH is 0.08 (low). I am guessing this was done via the research study he was in and they want him to follow this up here. Please have him schedule appt with me to f/u this lab value.  Thx,  [name], MD

## 2014-05-06 ENCOUNTER — Ambulatory Visit (INDEPENDENT_AMBULATORY_CARE_PROVIDER_SITE_OTHER): Payer: Commercial Managed Care - HMO | Admitting: Podiatry

## 2014-05-06 ENCOUNTER — Encounter: Payer: Self-pay | Admitting: Podiatry

## 2014-05-06 VITALS — BP 163/75 | HR 78 | Ht 72.0 in | Wt 224.0 lb

## 2014-05-06 DIAGNOSIS — E1149 Type 2 diabetes mellitus with other diabetic neurological complication: Secondary | ICD-10-CM

## 2014-05-06 DIAGNOSIS — B351 Tinea unguium: Secondary | ICD-10-CM

## 2014-05-06 DIAGNOSIS — M79609 Pain in unspecified limb: Secondary | ICD-10-CM

## 2014-05-06 DIAGNOSIS — M79606 Pain in leg, unspecified: Secondary | ICD-10-CM

## 2014-05-06 DIAGNOSIS — E1142 Type 2 diabetes mellitus with diabetic polyneuropathy: Secondary | ICD-10-CM

## 2014-05-06 DIAGNOSIS — E1140 Type 2 diabetes mellitus with diabetic neuropathy, unspecified: Secondary | ICD-10-CM

## 2014-05-06 DIAGNOSIS — M216X9 Other acquired deformities of unspecified foot: Secondary | ICD-10-CM

## 2014-05-06 NOTE — Progress Notes (Signed)
Subjective: 63 year old male presents complaining of pain on bottom and toes off and on for about a year especially been walking for a while. HgA1c was at 6 last week. He does not work. Disabled from Lupus and heart trouble.   Review of Systems - General ROS: negative for - chills, fatigue, fever, night sweats or sleep disturbance Ophthalmic ROS: negative ENT ROS: negative Respiratory ROS: no cough, shortness of breath, or wheezing Cardiovascular ROS: no chest pain or dyspnea on exertion Gastrointestinal ROS: no abdominal pain, change in bowel habits, or black or bloody stools Genito-Urinary ROS: no dysuria, trouble voiding, or hematuria Musculoskeletal ROS: negative Neurological ROS: no TIA or stroke symptoms Dermatological ROS: negative.  Objective: Dermatologic: Thick dystrophic nails x 10. Vascular: Faintly palpable dorsalis pedis and not palpable posterior tibial pulse left. No pedal pulses palpable on right. No edema or erythema noted on lower limbs. Neurologic: Decreased sensory perception on both feet. Decreased response to Monofilament sensory testing bilateral. Orthopedic: Tight Achilles tendon bilateral. High arched cavus type foot bilateral. Calcaneal varus right > left.  Limited Subtalar joint range of motion bilateral.  Decreased plantar fat fad both feet.  Contracted digits 1-5 right > left.   Assessment: Peripheral neuropathy bilateral. Cavus type foot with Limited Subtalar joint motion. Metatarsalgia secondary to lack of shock absorbing mechanism bilateral.  Hammer toe deformities bilateral with cocked up hallux right foot.   Plan:  Reviewed clinical findings and available treatment options. Reviewed Achilles tendon stretch exercise. Reviewed benefit of Custom orthotics. Palliation prn. All nails debrided. Patient may benefit from diabetic shoes if custom orthotics are not available.

## 2014-05-06 NOTE — Patient Instructions (Signed)
Seen for painful feet. Need to do stretch exercise. May benefit from custom orthotics or diabetic shoes. All nails debrided today.  Return in 3 months for RFC.

## 2014-05-10 ENCOUNTER — Encounter: Payer: Self-pay | Admitting: Family Medicine

## 2014-05-10 NOTE — Progress Notes (Signed)
Placed in MDs box. Blake Wilson Dawn  

## 2014-05-10 NOTE — Progress Notes (Signed)
Patient dropped off form to be filled out for diabetic shoes.  Please fax when completed.

## 2014-05-16 ENCOUNTER — Telehealth: Payer: Self-pay

## 2014-05-16 NOTE — Progress Notes (Signed)
Form faxed to [phone].  Form copied for scanning in pt's record.Hassell Done, Rosine Beat, RN

## 2014-05-22 NOTE — Telephone Encounter (Signed)
I don't prescribe viagra

## 2014-05-24 ENCOUNTER — Telehealth: Payer: Self-pay

## 2014-05-24 NOTE — Telephone Encounter (Signed)
Called to order viagra from the company will be sent out on 06/19/14. Ref # [id number]

## 2014-05-24 NOTE — Telephone Encounter (Signed)
We are not to call and order viagra for this patient he needs to call his pcp to get it order.

## 2014-06-13 ENCOUNTER — Encounter: Payer: Self-pay | Admitting: Cardiovascular Disease

## 2014-06-13 ENCOUNTER — Ambulatory Visit (INDEPENDENT_AMBULATORY_CARE_PROVIDER_SITE_OTHER): Payer: Commercial Managed Care - HMO | Admitting: Cardiovascular Disease

## 2014-06-13 VITALS — BP 146/84 | HR 73 | Ht 73.0 in | Wt 224.8 lb

## 2014-06-13 DIAGNOSIS — Z79899 Other long term (current) drug therapy: Secondary | ICD-10-CM

## 2014-06-13 DIAGNOSIS — I422 Other hypertrophic cardiomyopathy: Secondary | ICD-10-CM

## 2014-06-13 DIAGNOSIS — I428 Other cardiomyopathies: Secondary | ICD-10-CM

## 2014-06-13 DIAGNOSIS — R7989 Other specified abnormal findings of blood chemistry: Secondary | ICD-10-CM

## 2014-06-13 DIAGNOSIS — E038 Other specified hypothyroidism: Secondary | ICD-10-CM

## 2014-06-13 DIAGNOSIS — I10 Essential (primary) hypertension: Secondary | ICD-10-CM

## 2014-06-13 DIAGNOSIS — R946 Abnormal results of thyroid function studies: Secondary | ICD-10-CM

## 2014-06-13 DIAGNOSIS — I429 Cardiomyopathy, unspecified: Secondary | ICD-10-CM

## 2014-06-13 HISTORY — DX: Other specified abnormal findings of blood chemistry: R79.89

## 2014-06-13 LAB — BASIC METABOLIC PANEL
BUN: 16 mg/dL (ref 6–23)
CALCIUM: 9 mg/dL (ref 8.4–10.5)
CHLORIDE: 100 meq/L (ref 96–112)
CO2: 25 mEq/L (ref 19–32)
CREATININE: 1.1 mg/dL (ref 0.4–1.5)
GFR: 86.79 mL/min (ref >60.00)
Glucose, Bld: 288 mg/dL — ABNORMAL HIGH (ref 70–99)
Potassium: 3.9 mEq/L (ref 3.5–5.1)
Sodium: 134 mEq/L — ABNORMAL LOW (ref 135–145)

## 2014-06-13 LAB — TSH: TSH: 1.66 u[IU]/mL (ref 0.35–4.50)

## 2014-06-13 LAB — BRAIN NATRIURETIC PEPTIDE: PRO B NATRI PEPTIDE: 9 pg/mL (ref 0.0–100.0)

## 2014-06-13 LAB — T4, FREE: Free T4: 0.94 ng/dL (ref 0.60–1.60)

## 2014-06-13 MED ORDER — CARVEDILOL 25 MG PO TABS: 25.0000 mg | ORAL_TABLET | Freq: Two times a day (BID) | ORAL | Status: DC

## 2014-06-13 NOTE — Assessment & Plan Note (Signed)
Well controlled.  Continue current medications and low sodium Dash type diet.    

## 2014-06-13 NOTE — Patient Instructions (Signed)
Your physician recommends that you schedule a follow-up appointment in:   [place] have been referred to ENDO   DX  HYPOTHYROIDISM  Your physician has recommended you make the following change in your medication:  INCREASE  CARVEDILOL TO  25 MG  TWICE DAILY  Your physician recommends that you return for lab work in:  [place]  BMET BNP   TSH  T4  Your physician has requested that you have a cardiac MRI. Cardiac MRI uses a computer to create images of your heart as its beating, producing both still and moving pictures of your heart and major blood vessels. For further information please visit [email or web address] Please follow the instruction sheet given to you today for more information.

## 2014-06-13 NOTE — Assessment & Plan Note (Addendum)
On good medical Rx now Cardiac MRI to reassess EF  Ef still low will need cath and consideration for AICD  Increase coreg to 25 bid

## 2014-06-13 NOTE — Progress Notes (Signed)
Patient ID: Blake Wilson, male   DOB: 1950/11/02, 63 y.o.   MRN: [id number] amere bricco established care with [place] 4/15  at the request of Dr Dianah Field,   He has history of nonischemic DCM with HTN and elevated lipids.   He is doing well with no SOB, palpitations, edema or SSCP. His last echo in 09/26/11 which I reviewed showed mild LVE with mild diffuse hypokinesis EF 50-55% and mild AR and trivial MR. He is not working but says he goes to a gym 2-3 times / week and "walks around" It is clear that he has little insight into his disease, meds and compliance BS poorly controlled and should be on insulin Not taking coreg at all and not clear to me that he is taking his other BP meds regularly Has some blurry vision and needs to f/u with ophthalmologist   Restarted on beta blocker last visit  F/U echo 4/15  EF worse   Study Conclusions  - Left ventricle: The cavity size was normal. Wall thickness was increased in a pattern of mild LVH. Systolic function was severely reduced. The estimated ejection fraction was in the range of 25% to 30%. Diffuse hypokinesis. Doppler parameters are consistent with abnormal left ventricular relaxation (grade 1 diastolic dysfunction). - Aortic valve: Trivial regurgitation. - Mitral valve: Mild regurgitation. Impressions:  - Compared to 09/26/11 LV function is worse.  4/15 CR 1.1  BNP 11  K 4.3   TSH 5/29 suppressed .08   He is unaware of tyroid issue Not clear to me who ordered TSH>  Palpitations and dyspnea a bit better with compliance with meds especially coreg   ROS: Denies fever, malais, weight loss, blurry vision, decreased visual acuity, cough, sputum, SOB, hemoptysis, pleuritic pain, palpitaitons, heartburn, abdominal pain, melena, lower extremity edema, claudication, or rash.  All other systems reviewed and negative  General: Affect appropriate Overweight black male  HEENT: normal Neck supple with no adenopathy JVP normal no bruits no  thyromegaly Lungs clear with no wheezing and good diaphragmatic motion Heart:  S1/S2 no murmur, no rub, gallop or click PMI normal Abdomen: benighn, BS positve, no tenderness, no AAA no bruit.  No HSM or HJR Distal pulses intact with no bruits No edema Neuro non-focal Skin warm and dry No muscular weakness   Current Outpatient Prescriptions  Medication Sig Dispense Refill  . aspirin 81 MG tablet Take 81 mg by mouth daily. After a meal       . atorvastatin (LIPITOR) 80 MG tablet Take 1 tablet (80 mg total) by mouth at bedtime.  30 tablet  12  . Blood Glucose Monitoring Suppl (BLOOD GLUCOSE METER) kit Check sugars twice daily.  1 each  12  . carvedilol (COREG) 12.5 MG tablet Take 1 tablet (12.5 mg total) by mouth 2 (two) times daily.  180 tablet  4  . furosemide (LASIX) 40 MG tablet Take 1 tablet (40 mg total) by mouth daily.  30 tablet  5  . gabapentin (NEURONTIN) 300 MG capsule Take 1-3 tablets qhs as needed for lower extremity pain.  90 capsule  3  . Lancets MISC USE TWICE DAILY TO CHECK BLOOD SUGAR  200 each  0  . lisinopril (PRINIVIL,ZESTRIL) 40 MG tablet Take 1 tablet (40 mg total) by mouth daily.  90 tablet  4  . metFORMIN (GLUCOPHAGE) 1000 MG tablet Take 1 tablet (1,000 mg total) by mouth 2 (two) times daily with a meal.  60 tablet  11  . nitroGLYCERIN (NITROSTAT)  0.4 MG SL tablet Place 1 tablet (0.4 mg total) under the tongue every 5 (five) minutes as needed for chest pain.  20 tablet  3  . sildenafil (VIAGRA) 50 MG tablet Take 1 tablet (50 mg total) by mouth daily as needed for erectile dysfunction. Hold if taken nitroglycerin or for BP<120/80.  10 tablet  2  . travoprost, benzalkonium, (TRAVATAN) 0.004 % ophthalmic solution Place 1 drop into both eyes at bedtime.       . Liraglutide (VICTOZA) 18 MG/3ML SOPN Inject 0.6 mg into the skin daily. For 1 week. Then increase to 1.84m daily.  3 pen  1  . [DISCONTINUED] sitaGLIPtin (JANUVIA) 100 MG tablet Take 1 tablet (100 mg total) by  mouth daily.  30 tablet  3   No current facility-administered medications for this visit.    Allergies  Review of patient's allergies indicates no known allergies.  Electrocardiogram:  4/10 SR 95 LVH  LAD   Assessment and Plan

## 2014-06-13 NOTE — Assessment & Plan Note (Addendum)
Cholesterol is not at goal.  Continue current dose of statin and diet Rx.  No myalgias or side effects.  F/U  LFT's in 6 months. Lab Results  Component Value Date   LDLCALC 182* 10/25/2013  Should at statin once CHF meds titrated

## 2014-06-13 NOTE — Assessment & Plan Note (Signed)
Repeat TSH and T 4 today refer to endocrine Increase beta blocker HR much better than previous Certainly does not look hyperthyroid

## 2014-06-15 ENCOUNTER — Encounter: Payer: Self-pay | Admitting: Cardiovascular Disease

## 2014-06-15 ENCOUNTER — Other Ambulatory Visit: Payer: Self-pay | Admitting: *Deleted

## 2014-06-15 DIAGNOSIS — I429 Cardiomyopathy, unspecified: Secondary | ICD-10-CM

## 2014-06-15 MED ORDER — CARVEDILOL 25 MG PO TABS: 25.0000 mg | ORAL_TABLET | Freq: Two times a day (BID) | ORAL | Status: DC

## 2014-06-24 ENCOUNTER — Telehealth: Payer: Self-pay

## 2014-06-24 NOTE — Telephone Encounter (Signed)
Called patient to let him that his samples of vigra was here

## 2014-06-28 ENCOUNTER — Ambulatory Visit (HOSPITAL_COMMUNITY): Payer: Medicare HMO

## 2014-06-30 ENCOUNTER — Encounter: Payer: Self-pay | Admitting: *Deleted

## 2014-06-30 ENCOUNTER — Ambulatory Visit: Payer: Medicare HMO | Admitting: Endocrinology

## 2014-06-30 DIAGNOSIS — Z0289 Encounter for other administrative examinations: Secondary | ICD-10-CM

## 2014-07-19 ENCOUNTER — Telehealth: Payer: Self-pay | Admitting: Cardiovascular Disease

## 2014-07-19 NOTE — Telephone Encounter (Signed)
Patient cancelled MRI due to financial reasons.

## 2014-07-27 ENCOUNTER — Telehealth: Payer: Self-pay | Admitting: *Deleted

## 2014-07-27 NOTE — Telephone Encounter (Signed)
Message copied by Richmond Campbell on Wed Jul 27, 2014  5:02 PM ------      Message from: Shirl Harris I      Created: Tue Jul 12, 2014 11:19 AM      Regarding: Cardiac MRI             FYI            Patient did not have cardiac MRI due to financial reasons.            [place] ------

## 2014-07-27 NOTE — Telephone Encounter (Signed)
REMOVED  TEST DATE  FROM  F/U BOOK .Adonis Housekeeper

## 2014-08-05 ENCOUNTER — Ambulatory Visit: Payer: Commercial Managed Care - HMO | Admitting: Podiatry

## 2014-08-31 ENCOUNTER — Telehealth: Payer: Self-pay

## 2014-08-31 NOTE — Telephone Encounter (Signed)
Called [facility] to order viagra 50 mg Ref # [id number]

## 2014-09-06 ENCOUNTER — Ambulatory Visit (INDEPENDENT_AMBULATORY_CARE_PROVIDER_SITE_OTHER): Payer: Medicare HMO | Admitting: Cardiovascular Disease

## 2014-09-06 ENCOUNTER — Telehealth: Payer: Self-pay | Admitting: Cardiovascular Disease

## 2014-09-06 ENCOUNTER — Encounter: Payer: Self-pay | Admitting: Cardiovascular Disease

## 2014-09-06 VITALS — BP 140/82 | HR 81 | Ht 72.0 in | Wt 215.6 lb

## 2014-09-06 DIAGNOSIS — E1159 Type 2 diabetes mellitus with other circulatory complications: Secondary | ICD-10-CM

## 2014-09-06 DIAGNOSIS — I10 Essential (primary) hypertension: Secondary | ICD-10-CM

## 2014-09-06 DIAGNOSIS — I420 Dilated cardiomyopathy: Secondary | ICD-10-CM

## 2014-09-06 DIAGNOSIS — E785 Hyperlipidemia, unspecified: Secondary | ICD-10-CM

## 2014-09-06 NOTE — Patient Instructions (Addendum)
Your physician recommends that you schedule a follow-up appointment in: PENDING   WHAT  TEST  IS  DONE  3 MONTHS  WITH  DR Johnsie Cancel   Your physician recommends that you continue on your current medications as directed. Please refer to the Current Medication list given to you today.

## 2014-09-06 NOTE — Assessment & Plan Note (Signed)
Patient not wanting cath or MRI to further investigate if ischemic or not.  Continue current medical rX  Euvokemic  Consider f/u echo in a year

## 2014-09-06 NOTE — Telephone Encounter (Signed)
Follow Up ° ° ° ° ° ° ° °Pt returning phone call °

## 2014-09-06 NOTE — Telephone Encounter (Signed)
LMTCB  ON Friday ./CY

## 2014-09-06 NOTE — Assessment & Plan Note (Signed)
Cholesterol is not  at goal.  Started on statin and diet Rx.  No myalgias or side effects.  F/U  LFT's in 6 months. Lab Results  Component Value Date   LDLCALC 182* 10/25/2013

## 2014-09-06 NOTE — Assessment & Plan Note (Signed)
Discussed low carb diet.  Target hemoglobin A1c is 6.5 or less.  Continue current medications.  

## 2014-09-06 NOTE — Assessment & Plan Note (Signed)
Well controlled.  Continue current medications and low sodium Dash type diet.    

## 2014-09-06 NOTE — Progress Notes (Signed)
Patient ID: Blake Wilson, male   DOB: January 12, 1951, 63 y.o.   MRN: [id number] danna casella established care with [place] 4/15 at the request of Dr Dianah Field,   He has history of nonischemic DCM with HTN and elevated lipids.  He is doing well with no SOB, palpitations, edema or SSCP. His last echo in 09/26/11 which I reviewed showed mild LVE with mild diffuse hypokinesis EF 50-55% and mild AR and trivial MR. He is not working but says he goes to a gym 2-3 times / week and "walks around" It is clear that he has little insight into his disease, meds and compliance BS poorly controlled and should be on insulin Not taking coreg at all and not clear to me that he is taking his other BP meds regularly Has some blurry vision and needs to f/u with ophthalmologist   Restarted on beta blocker last visit  F/U echo 4/15 EF worse   Study Conclusions  - Left ventricle: The cavity size was normal. Wall thickness was increased in a pattern of mild LVH. Systolic function was severely reduced. The estimated ejection fraction was in the range of 25% to 30%. Diffuse hypokinesis. Doppler parameters are consistent with abnormal left ventricular relaxation (grade 1 diastolic dysfunction). - Aortic valve: Trivial regurgitation. - Mitral valve: Mild regurgitation. Impressions:  - Compared to 09/26/11 LV function is worse.  4/15 CR 1.1 BNP 11 K 4.3 TSH 5/29 suppressed .08   But f/u normal    Palpitations and dyspnea a bit better with compliance with meds especially coreg  Was supposed to have f/u MRI for EF and decide in cath / AICD but not done yet He did not want to pay for it.  He is also not sure if his insurance will cover cath For now he wishes to continue medical Rx and consider f/u echo in 6 months    ROS: Denies fever, malais, weight loss, blurry vision, decreased visual acuity, cough, sputum, SOB, hemoptysis, pleuritic pain, palpitaitons, heartburn, abdominal pain, melena, lower extremity edema,  claudication, or rash.  All other systems reviewed and negative  General: Affect appropriate Healthy:  appears stated age 72: normal Neck supple with no adenopathy JVP normal no bruits no thyromegaly Lungs clear with no wheezing and good diaphragmatic motion Heart:  S1/S2 no murmur, no rub, gallop or click PMI normal Abdomen: benighn, BS positve, no tenderness, no AAA no bruit.  No HSM or HJR Distal pulses intact with no bruits No edema Neuro non-focal Skin warm and dry No muscular weakness   Current Outpatient Prescriptions  Medication Sig Dispense Refill  . aspirin 81 MG tablet Take 81 mg by mouth daily. After a meal     . atorvastatin (LIPITOR) 80 MG tablet Take 1 tablet (80 mg total) by mouth at bedtime. 30 tablet 12  . Blood Glucose Monitoring Suppl (BLOOD GLUCOSE METER) kit Check sugars twice daily. 1 each 12  . carvedilol (COREG) 25 MG tablet Take 1 tablet (25 mg total) by mouth 2 (two) times daily. 180 tablet 4  . furosemide (LASIX) 40 MG tablet Take 1 tablet (40 mg total) by mouth daily. 30 tablet 5  . gabapentin (NEURONTIN) 300 MG capsule Take 1-3 tablets qhs as needed for lower extremity pain. 90 capsule 3  . Lancets MISC USE TWICE DAILY TO CHECK BLOOD SUGAR 200 each 0  . Liraglutide (VICTOZA) 18 MG/3ML SOPN Inject 0.6 mg into the skin daily. For 1 week. Then increase to 1.17m daily. 3 pen 1  .  lisinopril (PRINIVIL,ZESTRIL) 40 MG tablet Take 1 tablet (40 mg total) by mouth daily. 90 tablet 4  . metFORMIN (GLUCOPHAGE) 1000 MG tablet Take 1 tablet (1,000 mg total) by mouth 2 (two) times daily with a meal. 60 tablet 11  . nitroGLYCERIN (NITROSTAT) 0.4 MG SL tablet Place 1 tablet (0.4 mg total) under the tongue every 5 (five) minutes as needed for chest pain. 20 tablet 3  . sildenafil (VIAGRA) 50 MG tablet Take 1 tablet (50 mg total) by mouth daily as needed for erectile dysfunction. Hold if taken nitroglycerin or for BP<120/80. 10 tablet 2  . travoprost, benzalkonium,  (TRAVATAN) 0.004 % ophthalmic solution Place 1 drop into both eyes at bedtime.     . [DISCONTINUED] sitaGLIPtin (JANUVIA) 100 MG tablet Take 1 tablet (100 mg total) by mouth daily. 30 tablet 3   No current facility-administered medications for this visit.    Allergies  Review of patient's allergies indicates no known allergies.  Electrocardiogram:  SR rate 94 LAD LVH   Assessment and Plan

## 2014-09-06 NOTE — Telephone Encounter (Signed)
LMTCB ./CY 

## 2014-09-06 NOTE — Telephone Encounter (Signed)
Pt calling re giving you info on MRI, pls call

## 2014-09-07 ENCOUNTER — Telehealth: Payer: Self-pay

## 2014-09-07 NOTE — Telephone Encounter (Signed)
lmtco

## 2014-09-09 NOTE — Telephone Encounter (Signed)
PER PT  THINKS INS  NEXT YEAR  WILL COVER STUDIES  BETTER    PT  WISHES  TO  WAIT   UNTIL  AFTER  FEB  APPT     BEFORE PROCEEDS  WITH  ANY TESTING PT  AWARE   DR  Johnsie Cancel   WILL DECIDE AT THAT TIME  IF  ANY  TESTING  NEEDS TO BE DONE .Adonis Housekeeper

## 2014-09-21 ENCOUNTER — Ambulatory Visit: Payer: Commercial Managed Care - HMO | Admitting: *Deleted

## 2014-09-21 ENCOUNTER — Ambulatory Visit (INDEPENDENT_AMBULATORY_CARE_PROVIDER_SITE_OTHER): Payer: Commercial Managed Care - HMO | Admitting: *Deleted

## 2014-09-21 DIAGNOSIS — Z23 Encounter for immunization: Secondary | ICD-10-CM

## 2014-09-22 ENCOUNTER — Telehealth: Payer: Self-pay | Admitting: Cardiovascular Disease

## 2014-09-22 NOTE — Telephone Encounter (Signed)
Walk in pt form " the patient Assistance Program paper" Dropped Off gave to [place] For Completion/KM

## 2014-09-23 ENCOUNTER — Ambulatory Visit: Payer: Commercial Managed Care - HMO | Admitting: Podiatry

## 2014-10-03 ENCOUNTER — Ambulatory Visit: Payer: Commercial Managed Care - HMO | Admitting: Family Medicine

## 2014-10-18 ENCOUNTER — Telehealth: Payer: Self-pay | Admitting: Cardiovascular Disease

## 2014-10-18 NOTE — Telephone Encounter (Signed)
New message      Pt brought a form to our office from [place] to get viagra.  Did you get it and what is the status on getting this approved?

## 2014-10-19 ENCOUNTER — Telehealth: Payer: Self-pay | Admitting: Cardiovascular Disease

## 2014-10-19 NOTE — Telephone Encounter (Signed)
Walk in pt form "pfizer application" questions about this gave to [place] 12.30.15/km

## 2014-10-20 NOTE — Telephone Encounter (Signed)
Need to get prescription from patient's PCP, Dr. Conni Slipper before I can send in paper work. Office is closed today, will call Monday morning 10/24/2014.

## 2014-10-26 ENCOUNTER — Telehealth: Payer: Self-pay | Admitting: *Deleted

## 2014-10-26 NOTE — Telephone Encounter (Signed)
Received a call from [facility].  Pt's Cardiologist will not write the Rx for Viagra.  Pt need a Rx from PCP for Viagra.  She is faxing a form for PCP to complete because pt is receiving the medication from [place] patient assistance program.  Once fax is received; will place in provider for review.  Derl Barrow, RN

## 2014-10-26 NOTE — Telephone Encounter (Signed)
Application for Viagra through [employer] Patient Assistance Program faxed to [place], [place].

## 2014-10-27 NOTE — Telephone Encounter (Signed)
[name], my understanding was we do not fill out patient assistance program forms for medications because then they mail the medication to our office to distribute to patient. I can prescribe the viagra to his pharmacy, though, which I have discussed with him in the past. Will fwd to Dr Valentina Lucks to help me verify this.  Thanks,  [name], MD

## 2014-10-28 ENCOUNTER — Encounter: Payer: Self-pay | Admitting: Podiatry

## 2014-10-28 ENCOUNTER — Ambulatory Visit (INDEPENDENT_AMBULATORY_CARE_PROVIDER_SITE_OTHER): Payer: PPO | Admitting: Podiatry

## 2014-10-28 VITALS — BP 165/85 | HR 61

## 2014-10-28 DIAGNOSIS — M2040 Other hammer toe(s) (acquired), unspecified foot: Secondary | ICD-10-CM

## 2014-10-28 DIAGNOSIS — B351 Tinea unguium: Secondary | ICD-10-CM

## 2014-10-28 DIAGNOSIS — E1140 Type 2 diabetes mellitus with diabetic neuropathy, unspecified: Secondary | ICD-10-CM

## 2014-10-28 DIAGNOSIS — M79606 Pain in leg, unspecified: Secondary | ICD-10-CM

## 2014-10-28 NOTE — Patient Instructions (Signed)
Seen for hypertrophic nails and diabetic shoes. Measured for diabetic shoes and all nails debrided. Return in 3 months or as needed.

## 2014-10-28 NOTE — Progress Notes (Signed)
Patient wants nails trimmed and order diabetic shoes.  Subjective: 64 year old male presents requesting toe nails trimmed and prepare for diabetic shoes. He was having pain on bottom and toes off and on for about a year especially been walking for a while.  His blood sugar went up to 180 since he was out of trial drug. He does not work. Disabled from Lupus and heart trouble.   Objective: Dermatologic: Thick dystrophic nails x 10. Vascular: Faintly palpable dorsalis pedis and not palpable posterior tibial pulse left. No pedal pulses palpable on right. No edema or erythema noted on lower limbs. Neurologic: Decreased sensory perception on both feet. Decreased response to Monofilament sensory testing bilateral. Orthopedic: Tight Achilles tendon bilateral. High arched cavus type foot bilateral. Calcaneal varus right > left.  Limited Subtalar joint range of motion bilateral.  Decreased plantar fat fad both feet.  Contracted digits 1-5 right > left.   Assessment: Peripheral neuropathy bilateral. Cavus type foot with Limited Subtalar joint motion. Metatarsalgia secondary to lack of shock absorbing mechanism bilateral.  Hammer toe deformities bilateral with cocked up hallux right foot.   Plan:  Reviewed clinical findings and available treatment options. Both feet measured for diabetic shoes. All nails debrided.

## 2014-10-31 ENCOUNTER — Encounter: Payer: Self-pay | Admitting: Family Medicine

## 2014-10-31 ENCOUNTER — Telehealth: Payer: Self-pay | Admitting: Family Medicine

## 2014-10-31 ENCOUNTER — Ambulatory Visit (INDEPENDENT_AMBULATORY_CARE_PROVIDER_SITE_OTHER): Payer: PPO | Admitting: Family Medicine

## 2014-10-31 VITALS — BP 166/83 | HR 83 | Temp 97.5°F | Ht 72.0 in | Wt 224.0 lb

## 2014-10-31 DIAGNOSIS — I10 Essential (primary) hypertension: Secondary | ICD-10-CM

## 2014-10-31 DIAGNOSIS — E1159 Type 2 diabetes mellitus with other circulatory complications: Secondary | ICD-10-CM | POA: Diagnosis not present

## 2014-10-31 LAB — POCT GLYCOSYLATED HEMOGLOBIN (HGB A1C): Hemoglobin A1C: 8.6

## 2014-10-31 MED ORDER — LIRAGLUTIDE 18 MG/3ML ~~LOC~~ SOPN: 0.6000 mg | PEN_INJECTOR | Freq: Every day | SUBCUTANEOUS | Status: DC

## 2014-10-31 NOTE — Progress Notes (Signed)
Patient ID: Blake Wilson, male   DOB: 1951-08-12, 64 y.o.   MRN: [id number] Patient ID: Blake Wilson, male   DOB: 1951/05/16, 64 y.o.   MRN: [id number] Subjective:   CC: Follow up DM and HTN  HPI:   DM Was on victoza but ran out 3 weeks ago and has only been taking metformin. Blood sugars are 120s-170s. No low blood sugars. Has had some polyuria and polydypsia about 1 month ago but has resolved. Blood sugars were in 200s. No dietary indescretions reported at that time.  No chest pain, new dyspnea, dizziness, syncope, or lightheadedness.  He is setting up nutrition appointments at another location. Still sees podiatry. Checks feet regularly. Keeping sugary foods to a low, drinking 12 oz soda daily. Treadmill exercise almost every other day, stopped doing that 1 month ago because got lazy. Taking lipitor daily. Saw Dr Katy Fitch April who did diabetic eye exam.  HTN Takes medications daily.  BP in study program is usually "okay" that he gets checked frequently, 120-130s/80s. No cuff at home. Denies chest pain, leg swelling, new dyspnea, dizziness or syncope. Takes lasix, coreg, lisinopril regularly. Creatinine 1.1 in Aug.   Review of Systems - Per HPI.   Smoking status: Former smoker    Objective:  Physical Exam BP 166/83 mmHg  Pulse 83  Temp(Src) 97.5 F (36.4 C) (Oral)  Ht 6' (1.829 m)  Wt 224 lb (101.606 kg)  BMI 30.37 kg/m2 GEN: NAD HEENT: Atraumatic, normocephalic, neck supple, EOMI, sclera clear  CV: RRR, no murmurs, rubs, or gallops; 1+ radial pulse bilaterally, no appreciable JVD PULM: CTAB, normal effort SKIN: No rash or cyanosis EXTR: No LE edema; no calf tenderness NEURO: Awake, alert, no focal deficits grossly, normal speech.    Assessment:     Blake Wilson is a 64 y.o. male with h/o DM, dilated CM, systolic CHF and HTN here for follow up.    Plan:    # Health Maintenance: not discussed  Follow-up: Follow up in 2 weeks - call with BP readings at  home/study.  [name], MD [place]

## 2014-10-31 NOTE — Patient Instructions (Signed)
Good to see you today.  Your A1c is up to 8.6. This is not as good as it was in June. Continue victoza.  Make a diabetic eye exam appointment. Plan to go to the dietitian on [address]. Cut back soda to 6 ounces daily. Increase exercise to 4-5 days weekly on the treadmill or walking elsewhere. Take aspirin daily. Follow up in 3 months.  Write down 3 BPs and call me with these in 2 weeks.  Best,  [name], MD

## 2014-11-01 NOTE — Assessment & Plan Note (Signed)
High today, good reportedly at multiple outside checks.  - call in 2 weeks and give me 3 BPs to decide if need to incerase BP med

## 2014-11-01 NOTE — Assessment & Plan Note (Signed)
A1c today 8.6 up from 6.9 in June, which was down from 10.6 in Jan. Likely due to not taking victoza. - Make diabetic eye exam appt - Plans to go to dietitian on [facility]. - Seek care if any foot issues. - Refill victoza.  - Drink no more than 6 oz soda daily. Get back to gym. - lipid panel jan, taking liptior daily now, prior noncompliance - Recommended taking asa daily. - F/u 3 mo.

## 2014-11-02 ENCOUNTER — Telehealth: Payer: Self-pay | Admitting: *Deleted

## 2014-11-02 NOTE — Telephone Encounter (Signed)
Received a message from Santiago Glad of [facility] stating she need a order for quantity sufficient for a 90 day supply and if provider would like any refills for the maintenance dosage 1.2 mg then a verbal ok is needed for second script.  Please give Santiago Glad a call at [phone].  Derl Barrow, RN

## 2014-11-03 MED ORDER — LIRAGLUTIDE 18 MG/3ML ~~LOC~~ SOPN: 1.2000 mg | PEN_INJECTOR | Freq: Every day | SUBCUTANEOUS | Status: DC

## 2014-11-03 MED ORDER — LIRAGLUTIDE 18 MG/3ML ~~LOC~~ SOPN: 0.6000 mg | PEN_INJECTOR | Freq: Every day | SUBCUTANEOUS | Status: DC

## 2014-11-03 NOTE — Telephone Encounter (Signed)
Spoke with Santiago Glad, wrote new script and okay'd her to take it verbally as well in case of lag time with e-script. Wrote for total 180 days (2 90-day scripts).  [name], MD

## 2014-11-10 ENCOUNTER — Telehealth: Payer: Self-pay | Admitting: Cardiovascular Disease

## 2014-11-10 NOTE — Telephone Encounter (Signed)
New Msg         Pt calling about prescription for viagra.     Please return call.

## 2014-11-10 NOTE — Telephone Encounter (Signed)
PT  AWARE  DR Johnsie Cancel DOES  NOT  PRESCRIBE  VIAGRA .Adonis Housekeeper

## 2014-12-07 ENCOUNTER — Ambulatory Visit: Payer: Commercial Managed Care - HMO | Admitting: Cardiovascular Disease

## 2014-12-20 ENCOUNTER — Ambulatory Visit: Payer: Commercial Managed Care - HMO | Admitting: Cardiovascular Disease

## 2015-01-17 NOTE — Telephone Encounter (Signed)
Blue team, please inform patient:  Spoke with Dr Valentina Lucks who agreed that it was not the best option to fill out form for viagra but have it sent to a different clinic (cardiology), which was what patient was hoping for. He suggested the patient contact the MAP program to see if they have viagra for lower cost. If so, I could rx it to them. He can call us once he finds out.  Blake Sinclair, MD

## 2015-01-17 NOTE — Telephone Encounter (Signed)
Patient is aware of message from PCP.  He will contact the MAP program and then let us know if this is an option for him. Number given [phone]. Rudean Icenhour,CMA

## 2015-01-18 ENCOUNTER — Telehealth: Payer: Self-pay | Admitting: Family Medicine

## 2015-01-18 NOTE — Telephone Encounter (Signed)
Pt is returning Blake Wilson's call. Please call back. jw

## 2015-01-19 NOTE — Telephone Encounter (Signed)
OK will wait to hear back.  [name], MD

## 2015-01-19 NOTE — Telephone Encounter (Signed)
Spoke with patient and he is unable to use the map program to do having private insurance.  Advised him to call pharmaceutical company and see if there is someway it can be guaranteed to ship to his home.  He will try and see what he can find out and let us know.  Then maybe we can help with the form. Lekia Nier,CMA

## 2015-01-25 NOTE — Progress Notes (Signed)
[name]  

## 2015-01-26 ENCOUNTER — Encounter: Payer: Commercial Managed Care - HMO | Admitting: Cardiovascular Disease

## 2015-01-27 ENCOUNTER — Ambulatory Visit: Payer: PPO | Admitting: Podiatry

## 2015-02-08 ENCOUNTER — Encounter: Payer: Self-pay | Admitting: Cardiovascular Disease

## 2015-02-24 ENCOUNTER — Encounter: Payer: Self-pay | Admitting: Podiatry

## 2015-02-24 ENCOUNTER — Ambulatory Visit (INDEPENDENT_AMBULATORY_CARE_PROVIDER_SITE_OTHER): Payer: PPO | Admitting: Podiatry

## 2015-02-24 VITALS — BP 150/77 | HR 83

## 2015-02-24 DIAGNOSIS — E1140 Type 2 diabetes mellitus with diabetic neuropathy, unspecified: Secondary | ICD-10-CM

## 2015-02-24 DIAGNOSIS — B351 Tinea unguium: Secondary | ICD-10-CM | POA: Diagnosis not present

## 2015-02-24 DIAGNOSIS — M79606 Pain in leg, unspecified: Secondary | ICD-10-CM

## 2015-02-24 NOTE — Patient Instructions (Signed)
Seen for hypertrophic nails. All nails debrided. Return in 3 months or as needed.  

## 2015-02-24 NOTE — Progress Notes (Signed)
Subjective: 64 year old male presents diabetic foot care. No new problems since last visit.  Objective: Dermatologic: Thick dystrophic nails x 10. Vascular: Faintly palpable dorsalis pedis and not palpable posterior tibial pulse left. No pedal pulses palpable on right. No edema or erythema noted on lower limbs. Neurologic: Decreased sensory perception on both feet. Decreased response to Monofilament sensory testing bilateral. Orthopedic: Tight Achilles tendon bilateral. High arched cavus type foot bilateral. Calcaneal varus right > left.  Limited Subtalar joint range of motion bilateral.  Decreased plantar fat fad both feet.  Contracted digits 1-5 right > left.   Assessment: Peripheral neuropathy bilateral. Cavus type foot with Limited Subtalar joint motion. Metatarsalgia secondary to lack of shock absorbing mechanism bilateral.  Hammer toe deformities bilateral with cocked up hallux right foot.   Plan:  Reviewed clinical findings and available treatment options. All nails debrided.

## 2015-03-05 ENCOUNTER — Encounter: Payer: Self-pay | Admitting: Family Medicine

## 2015-03-05 NOTE — Progress Notes (Signed)
Received fax from Willaim Rayas, MD that patient had UA showing1+ protein, 3+ leukocytes, trace occult blood, 25-50 WBC and many bacteria. Repeated and was WNL, so suspected contaminant. Pt was notified via phone 5/13 and asked to follow up with me. [name], MD

## 2015-03-09 ENCOUNTER — Other Ambulatory Visit: Payer: Self-pay | Admitting: *Deleted

## 2015-03-09 MED ORDER — METFORMIN HCL 1000 MG PO TABS: 1000.0000 mg | ORAL_TABLET | Freq: Two times a day (BID) | ORAL | Status: DC

## 2015-03-24 ENCOUNTER — Other Ambulatory Visit: Payer: Self-pay | Admitting: Family Medicine

## 2015-03-24 DIAGNOSIS — I509 Heart failure, unspecified: Secondary | ICD-10-CM

## 2015-03-24 MED ORDER — FUROSEMIDE 40 MG PO TABS: 40.0000 mg | ORAL_TABLET | Freq: Every day | ORAL | Status: DC

## 2015-04-10 ENCOUNTER — Encounter: Payer: Self-pay | Admitting: Family Medicine

## 2015-05-26 ENCOUNTER — Ambulatory Visit: Payer: PPO | Admitting: Podiatry

## 2015-06-01 ENCOUNTER — Other Ambulatory Visit: Payer: Self-pay | Admitting: *Deleted

## 2015-06-01 DIAGNOSIS — I509 Heart failure, unspecified: Secondary | ICD-10-CM

## 2015-06-02 MED ORDER — FUROSEMIDE 40 MG PO TABS: 40.0000 mg | ORAL_TABLET | Freq: Every day | ORAL | Status: DC

## 2015-06-27 ENCOUNTER — Telehealth: Payer: Self-pay | Admitting: Obstetrics and Gynecology

## 2015-06-27 NOTE — Telephone Encounter (Signed)
Would need to know what kind of device patient has in order to refill the right strips for his meter. Thanks!

## 2015-06-27 NOTE — Telephone Encounter (Signed)
LM for patient to call back and give [place] the brand name. Blake Wilson,CMA

## 2015-06-27 NOTE — Telephone Encounter (Signed)
Pt called and needs a refill on his test strips. Please send to the mail order pharmacy. jw

## 2015-06-28 NOTE — Telephone Encounter (Signed)
LM for patient to call back. Traven Davids,CMA  

## 2015-06-29 MED ORDER — GLUCOSE BLOOD VI STRP: ORAL_STRIP | Status: DC

## 2015-06-29 NOTE — Telephone Encounter (Signed)
Pt  Uses accu check avia plus test strips

## 2015-06-29 NOTE — Telephone Encounter (Signed)
Test strips ordered and sent to pharmacy.

## 2015-06-29 NOTE — Telephone Encounter (Signed)
Will forward to PCP about what kind of glucometer he uses. Please forward response back to [facility] blue pool. Erek Kowal, CMA.

## 2015-06-29 NOTE — Telephone Encounter (Signed)
Called pt twice at number listed in chart and msg and unable to leave VM as it sounds like someone picks up and does not say anything. If pt calls back please inform him test strips were sent to pharmacy. Kortnie Stovall, CMA.

## 2015-06-30 ENCOUNTER — Ambulatory Visit: Payer: PPO | Admitting: Podiatry

## 2015-07-05 ENCOUNTER — Telehealth: Payer: Self-pay | Admitting: Obstetrics and Gynecology

## 2015-07-05 NOTE — Telephone Encounter (Signed)
Tried to call pharmacy but line was busy.  Will try again later. Jazmin Hartsell,CMA

## 2015-07-05 NOTE — Telephone Encounter (Signed)
Spoke with pharmacy and patient's insurance plan doesn't cover the accu-check strips.  She will fax a list over with what is covered so a new meter with supplies can be ordered.  Will forward to MD and RN team to make aware. Kache Mcclurg,CMA

## 2015-07-05 NOTE — Telephone Encounter (Signed)
Pt called and would like to have more test strips called in. jw

## 2015-07-06 MED ORDER — GLUCOSE BLOOD VI STRP: ORAL_STRIP | Status: DC

## 2015-07-06 NOTE — Addendum Note (Signed)
Addended by: Katheren Shams on: 07/06/2015 08:28 PM   Modules accepted: Orders, Medications

## 2015-07-06 NOTE — Telephone Encounter (Signed)
Spoke with [employer].  Patient's insurance would cover [employer] or Free Style.  Completed standardized order form for DM supplies for One Touch Ultra Test strips, #200, 3 refills.  Form signed by Dr. Andria Frames.  Form faxed to [facility]. Please change test strips order on medication list.   Derl Barrow, RN

## 2015-08-17 ENCOUNTER — Ambulatory Visit: Payer: PPO | Admitting: Podiatry

## 2015-08-18 ENCOUNTER — Ambulatory Visit: Payer: PPO | Admitting: Podiatry

## 2015-09-05 ENCOUNTER — Other Ambulatory Visit: Payer: Self-pay | Admitting: Obstetrics and Gynecology

## 2015-09-05 NOTE — Telephone Encounter (Signed)
Will only give limited quantity of this medication for patient. He has not been seen in clinic for a while and this medication requires close monitoring of blood work. Please help patient schedule an appointment to be seen in the next 3 weeks.

## 2015-10-11 ENCOUNTER — Other Ambulatory Visit: Payer: Self-pay | Admitting: Obstetrics and Gynecology

## 2015-10-12 ENCOUNTER — Other Ambulatory Visit: Payer: Self-pay | Admitting: Obstetrics and Gynecology

## 2015-10-13 NOTE — Telephone Encounter (Signed)
Actually refilled this in November with the comment needs PCP follow-up and no further refills. Will not refill until he can be seen. Also needs heart failure follow-up.

## 2015-10-13 NOTE — Telephone Encounter (Signed)
Still have never met this patient and he has not been seen in our practice since 2015. I will give him a small supply of Lasix but will not refill until he can be seen in clinic. This medication needs monitoring and lab work which he has not gotten in over a year. Thanks

## 2015-11-13 ENCOUNTER — Other Ambulatory Visit: Payer: Self-pay | Admitting: Obstetrics and Gynecology

## 2015-11-14 NOTE — Telephone Encounter (Signed)
Have refilled this medication twice for patient and stated no further refills until he makes an appointment to be seen. I have never met this patient. Has not been seen in over a year and has not followed with cardiology in over 2 years for his heart failure. Please get him scheduled as soon as possible to get med refills and follow-up.

## 2015-11-24 ENCOUNTER — Other Ambulatory Visit: Payer: Self-pay | Admitting: Obstetrics and Gynecology

## 2015-12-01 DIAGNOSIS — H25813 Combined forms of age-related cataract, bilateral: Secondary | ICD-10-CM | POA: Diagnosis not present

## 2015-12-01 DIAGNOSIS — H401132 Primary open-angle glaucoma, bilateral, moderate stage: Secondary | ICD-10-CM | POA: Diagnosis not present

## 2015-12-01 DIAGNOSIS — E119 Type 2 diabetes mellitus without complications: Secondary | ICD-10-CM | POA: Diagnosis not present

## 2015-12-15 DIAGNOSIS — H401132 Primary open-angle glaucoma, bilateral, moderate stage: Secondary | ICD-10-CM | POA: Diagnosis not present

## 2016-01-05 DIAGNOSIS — H401132 Primary open-angle glaucoma, bilateral, moderate stage: Secondary | ICD-10-CM | POA: Diagnosis not present

## 2016-01-26 ENCOUNTER — Ambulatory Visit (INDEPENDENT_AMBULATORY_CARE_PROVIDER_SITE_OTHER): Payer: PPO | Admitting: Internal Medicine

## 2016-01-26 ENCOUNTER — Telehealth: Payer: Self-pay | Admitting: Internal Medicine

## 2016-01-26 ENCOUNTER — Encounter: Payer: Self-pay | Admitting: Internal Medicine

## 2016-01-26 VITALS — BP 145/70 | HR 65 | Temp 97.4°F | Ht 72.0 in | Wt 221.3 lb

## 2016-01-26 DIAGNOSIS — I5042 Chronic combined systolic (congestive) and diastolic (congestive) heart failure: Secondary | ICD-10-CM | POA: Diagnosis not present

## 2016-01-26 DIAGNOSIS — I10 Essential (primary) hypertension: Secondary | ICD-10-CM

## 2016-01-26 DIAGNOSIS — I429 Cardiomyopathy, unspecified: Secondary | ICD-10-CM | POA: Diagnosis not present

## 2016-01-26 DIAGNOSIS — Z0000 Encounter for general adult medical examination without abnormal findings: Secondary | ICD-10-CM

## 2016-01-26 DIAGNOSIS — Z23 Encounter for immunization: Secondary | ICD-10-CM | POA: Diagnosis not present

## 2016-01-26 DIAGNOSIS — I420 Dilated cardiomyopathy: Secondary | ICD-10-CM

## 2016-01-26 DIAGNOSIS — E1159 Type 2 diabetes mellitus with other circulatory complications: Secondary | ICD-10-CM

## 2016-01-26 LAB — CBC WITH DIFFERENTIAL/PLATELET
Basophils Absolute: 0 cells/uL (ref 0–200)
Basophils Relative: 0 %
EOS PCT: 2 %
Eosinophils Absolute: 94 cells/uL (ref 15–500)
HEMATOCRIT: 40.3 % (ref 38.5–50.0)
Hemoglobin: 13.6 g/dL (ref 13.2–17.1)
LYMPHS PCT: 26 %
Lymphs Abs: 1222 cells/uL (ref 850–3900)
MCH: 31.1 pg (ref 27.0–33.0)
MCHC: 33.7 g/dL (ref 32.0–36.0)
MCV: 92 fL (ref 80.0–100.0)
MONO ABS: 282 {cells}/uL (ref 200–950)
MONOS PCT: 6 %
MPV: 11.8 fL (ref 7.5–12.5)
NEUTROS PCT: 66 %
Neutro Abs: 3102 cells/uL (ref 1500–7800)
PLATELETS: 244 10*3/uL (ref 140–400)
RBC: 4.38 MIL/uL (ref 4.20–5.80)
RDW: 14.2 % (ref 11.0–15.0)
WBC: 4.7 10*3/uL (ref 3.8–10.8)

## 2016-01-26 LAB — COMPLETE METABOLIC PANEL WITH GFR
ALT: 36 U/L (ref 9–46)
AST: 18 U/L (ref 10–35)
Albumin: 3.7 g/dL (ref 3.6–5.1)
Alkaline Phosphatase: 81 U/L (ref 40–115)
BUN: 11 mg/dL (ref 7–25)
CALCIUM: 8.7 mg/dL (ref 8.6–10.3)
CHLORIDE: 103 mmol/L (ref 98–110)
CO2: 22 mmol/L (ref 20–31)
Creat: 1.06 mg/dL (ref 0.70–1.25)
GFR, Est African American: 85 mL/min (ref >=60)
GFR, Est Non African American: 73 mL/min (ref >=60)
Glucose, Bld: 302 mg/dL — ABNORMAL HIGH (ref 65–99)
POTASSIUM: 4.7 mmol/L (ref 3.5–5.3)
Sodium: 134 mmol/L — ABNORMAL LOW (ref 135–146)
Total Bilirubin: 0.5 mg/dL (ref 0.2–1.2)
Total Protein: 6.5 g/dL (ref 6.1–8.1)

## 2016-01-26 LAB — HEPATITIS C ANTIBODY: HCV Ab: NEGATIVE

## 2016-01-26 LAB — POCT GLYCOSYLATED HEMOGLOBIN (HGB A1C): HEMOGLOBIN A1C: 10

## 2016-01-26 LAB — HIV ANTIBODY (ROUTINE TESTING W REFLEX): HIV: NONREACTIVE

## 2016-01-26 MED ORDER — CARVEDILOL 25 MG PO TABS: 25.0000 mg | ORAL_TABLET | Freq: Two times a day (BID) | ORAL | Status: DC

## 2016-01-26 MED ORDER — ISOSORBIDE MONONITRATE ER 30 MG PO TB24: 30.0000 mg | ORAL_TABLET | Freq: Every day | ORAL | Status: DC

## 2016-01-26 MED ORDER — METFORMIN HCL 1000 MG PO TABS: 1000.0000 mg | ORAL_TABLET | Freq: Two times a day (BID) | ORAL | Status: DC

## 2016-01-26 MED ORDER — FUROSEMIDE 40 MG PO TABS
40.0000 mg | ORAL_TABLET | Freq: Every day | ORAL | Status: DC
Start: 2016-01-26 — End: 2016-12-31

## 2016-01-26 MED ORDER — NITROGLYCERIN 0.4 MG SL SUBL: 0.4000 mg | SUBLINGUAL_TABLET | SUBLINGUAL | Status: DC | PRN

## 2016-01-26 NOTE — Patient Instructions (Signed)
Call your cardiologist to make a follow up appointment ASAP We started you on Imdur; take one tablet daily. Watch out for dizziness/lightheadedness. Please call if you have these symptoms I made a referral to the GI doc for colonoscopy We are getting labs today Make an appointment for diabetes

## 2016-01-26 NOTE — Progress Notes (Signed)
Patient ID: Blake Wilson, male   DOB: 1951-09-21, 65 y.o.   MRN: [id number] Date of Visit: 01/26/2016   HPI:  Patient presents today for a well adult male exam.   Concerns today: none Sexual activity: yes  STD Screening: not concerned for STI Exercise: 3 times a week; walk on treadmill for about 1 hour Diet: well balanced, fast food twice a month, 12oz soda a day (non-diet), dessert once a day (cookies), 1 snack (nuts)  Smoking: no; former smoker 25 years ago; on and off 5 years- smoked cigars every day  Alcohol: one a day (1 can of beer) Drugs: none Advance directives: none Mood: no issues PHQ2 negative   Was taking Victoza but not insurance does not cover; stopped 3-4 months ago.  Chest Pain:  Was prescribed nitroglycerin in the past, but "has not needed it", although patient reports of sharp central chest pain that occurs once about 1-2 times a week. Episodes started 4-5 months ago. Episodes last 2-3 minutes and resolve when he takes a baby aspirin. Episodes can occur at rest and may get worse with activity (fater walking about 137ft). Last episode of chest pain we two weeks ago. No associated symptoms such as palpitations, nausea, diaphoresis, shortness of breath. Has a history of heart burn but symptoms are not similar to his usual heart burn. No chest pain currently. Per chart review, patient has a history of Dilated Cardiomyopathy and HFrEF with EF 25-30% with G1DD (in 2015). Last seen by cardiology in 08/2014- at that time patient declined cath/MRI for evaluation; repeat ECHO done at that time but patient had not had a follow up appointment since then.   Additionally denies HA, visual changes, fevers, chill, abdominal pain, diarrhea/costipation, no LE edema; reports of orthopnea that is stable  Noted HR of 46 on vitals. Denies dizziness or lightheadedness.   ROS: See HPI  [place]:  PMH, FH, and SH reviewed and updated in chart  PHYSICAL EXAM: BP 146/47 mmHg  Pulse 46   Temp(Src) 94.6 F (34.8 C) (Oral)  Wt 221 lb 4.8 oz (100.381 kg) Gen: NAD, pleasant, cooperative HEENT: NCAT, PERRL, no palpable thyromegaly or anterior cervical lymphadenopathy Heart: RRR, no murmurs Lungs: CTAB, NWOB Abdomen: abdominal obesity, soft, nontender to palpation Neuro: grossly nonfocal, speech normal GU: not examined  ASSESSMENT/PLAN:  # Health maintenance:  -STD screening: HIV and Hep C screen - immunizations:  PNA 13V today - lipid screening: future order (patient not fasting)  - general labs: CBC and CMP  - colonoscopy: 3 polyps noted on colonoscopy in 12/2010- 2 tubular adenoma and one tubulovillous adenoma with focal high grade glandular dysplasia. Repeat colonoscopy on 04/2011 without additional polyps. Recommended repeat colonoscopy in two years however, seems that patient did not follow up. Referral to GI made for colonoscopy today - History of Dilated Cardiomyopathy with HFrEF: Has not followed up with cardiology since 2015. With patient's recent symptoms of intermittent chest pain along with his history, will start Imdur daily. In the setting of intermittent chest pain and since pt does not have dizziness/lightheadedness, will defer to cardiology to possibly decreased dose of Coreg. Patient is clinically euvolemic, however will obtain BNP.   - DM 2: A1c obtained today 10 from 8.6 (10/2014). Asked to make follow up appointment ASAP   Congestive dilated cardiomyopathy Has not followed up with cardiology since 2015. With patient's recent symptoms of intermittent chest pain along with his history, will start Imdur daily. In the setting of intermittent chest pain in  the past few months and since pt does not have dizziness/lightheadedness with heart rate of 46, will defer to cardiology to possibly decreased dose of Coreg. Patient is clinically euvolemic and asymptomatic, however will obtain BNP.     Type 2 diabetes mellitus with other circulatory complications [id number] resulted 10  (from 8.6 in January 2016) after visit. Asked patient to make a follow up visit for DM.     FOLLOW UP: Follow up as soon as possible for DM   Smiley Houseman, MD PGY-1 Family Medicine

## 2016-01-26 NOTE — Telephone Encounter (Signed)
Called patient to inform him of worsening A1c. Asked patient to make an appointment to discuss DM soon. Patient understood.

## 2016-01-27 LAB — BRAIN NATRIURETIC PEPTIDE: Brain Natriuretic Peptide: 27.5 pg/mL (ref <100)

## 2016-01-27 NOTE — Assessment & Plan Note (Signed)
A1c resulted 10 (from 8.6 in January 2016) after visit. Asked patient to make a follow up visit for DM.

## 2016-01-27 NOTE — Assessment & Plan Note (Signed)
Has not followed up with cardiology since 2015. With patient's recent symptoms of intermittent chest pain along with his history, will start Imdur daily. In the setting of intermittent chest pain in the past few months and since pt does not have dizziness/lightheadedness with heart rate of 46, will defer to cardiology to possibly decreased dose of Coreg. Patient is clinically euvolemic and asymptomatic, however will obtain BNP.

## 2016-04-10 DIAGNOSIS — H401132 Primary open-angle glaucoma, bilateral, moderate stage: Secondary | ICD-10-CM | POA: Diagnosis not present

## 2016-04-10 DIAGNOSIS — H25813 Combined forms of age-related cataract, bilateral: Secondary | ICD-10-CM | POA: Diagnosis not present

## 2016-06-25 LAB — HEMOGLOBIN A1C: HEMOGLOBIN A1C: 5.7

## 2016-08-27 DIAGNOSIS — H401134 Primary open-angle glaucoma, bilateral, indeterminate stage: Secondary | ICD-10-CM | POA: Diagnosis not present

## 2016-08-27 DIAGNOSIS — H25813 Combined forms of age-related cataract, bilateral: Secondary | ICD-10-CM | POA: Diagnosis not present

## 2016-11-04 NOTE — Progress Notes (Signed)
   [facility] Phone: [phone]   Date of Visit: 11/05/2016   HPI:  DM2: - A1c: 10 (11/05/16) < 10 (01/2016) < 8.6 (10/2014) - Medications: Metformin 1000mg  BID. Victoza noted on med list but patient has been off of this for over 9 months as this is not covered by insurance. - Renal protection: on lisinopril - reports polyruia/polydypsi. Reports of increased frequency of urination for the past 1.5 months where he would go "2-3 times a night". He thought this was due to prostrate issues; he denies history of BPH. No dysuria, no hematuria, no urinary hesitancy, no incomplete bladder emptying, no difficulty starting urine stream/denies weak stream. No family history of prostrate issues/prostrate cancer.  - he is not checking sugars; meter lost meter 3-4 months ago-   HTN: - reports that he has been taken off of  some medications by his other doctor. He is in a research study and is on Sacubitril/Valsartan (Study physician Willaim Rayas [phone]) - coreg is on his medication list but he is currently not taking it. Does not recall why he stopped this or who stopped it.  - Imdur is also on his medication list which was started in Apirl 2017. He does not recall this medication at all - denies HA, chest pain, blurred vision, shortness of breath   ROS: See HPI.  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  Gout Onychomycosis Alcohol Abuse SLE Obesity Glaucoma History of Low TSH  PHYSICAL EXAM: BP (!) 146/80   Pulse 93   Temp 97.4 F (36.3 C) (Oral)   Wt 211 lb (95.7 kg)   SpO2 98%   BMI 28.62 kg/m  GEN: NAD CV: RRR, no murmurs, rubs, or gallops PULM: CTAB, normal effort ABD: Soft, nontender, nondistended, NABS, no organomegaly SKIN: No rash or cyanosis; warm and well-perfused EXTR: No lower extremity edema or calf tenderness PSYCH: Mood and affect euthymic, normal rate and volume of  speech NEURO: Awake, alert, no focal deficits grossly, normal speech  ASSESSMENT/PLAN:  Health maintenance:  - Influenza vaccine today  HYPERTENSION, BENIGN SYSTEMIC Patient is currently only on the study medication. Not on Coreg or Imdur which are on his list. He reports his other doctor stopped some of his medications but is unable to tell me which ones. Did not bring medications to clinic visit. Will call study physician to determine if it is okay to restart Coreg at least since he does have hx of HF.   Diabetes ([place]) A1c 10 today, unchanged from 01/2016. Not on Victoza anymore due to insurance. Continue Metformin BID. Start Lantus 10units daily. Prescribed new meter and supplies. Follow up in pharmacy clinic ASAP. Follow up with PCP in 2-3 weeks.  - UA negative for infection,but 500 glucose, and small blood. Microscopy with only 1-3 RBC. Urinary frequency likely due to hyperglycemia  Smiley Houseman, MD PGY [place]

## 2016-11-05 ENCOUNTER — Ambulatory Visit (INDEPENDENT_AMBULATORY_CARE_PROVIDER_SITE_OTHER): Payer: PPO | Admitting: Internal Medicine

## 2016-11-05 ENCOUNTER — Encounter: Payer: Self-pay | Admitting: Internal Medicine

## 2016-11-05 VITALS — BP 146/80 | HR 93 | Temp 97.4°F | Wt 211.0 lb

## 2016-11-05 DIAGNOSIS — E118 Type 2 diabetes mellitus with unspecified complications: Secondary | ICD-10-CM | POA: Diagnosis not present

## 2016-11-05 DIAGNOSIS — R350 Frequency of micturition: Secondary | ICD-10-CM

## 2016-11-05 DIAGNOSIS — Z23 Encounter for immunization: Secondary | ICD-10-CM | POA: Diagnosis not present

## 2016-11-05 DIAGNOSIS — I10 Essential (primary) hypertension: Secondary | ICD-10-CM

## 2016-11-05 LAB — POCT URINALYSIS DIPSTICK
BILIRUBIN UA: NEGATIVE
Glucose, UA: 500
Ketones, UA: NEGATIVE
LEUKOCYTES UA: NEGATIVE
NITRITE UA: NEGATIVE
PH UA: 5.5
PROTEIN UA: 30
Spec Grav, UA: 1.015
Urobilinogen, UA: 0.2

## 2016-11-05 LAB — POCT GLYCOSYLATED HEMOGLOBIN (HGB A1C): HEMOGLOBIN A1C: 10

## 2016-11-05 LAB — POCT UA - MICROSCOPIC ONLY

## 2016-11-05 MED ORDER — ONETOUCH ULTRASOFT LANCETS MISC: 12 refills | Status: DC

## 2016-11-05 MED ORDER — GLUCOSE BLOOD VI STRP: ORAL_STRIP | 12 refills | Status: DC

## 2016-11-05 MED ORDER — ONETOUCH ULTRA 2 W/DEVICE KIT: PACK | 0 refills | Status: DC

## 2016-11-05 MED ORDER — INSULIN GLARGINE 100 UNIT/ML SOLOSTAR PEN: 10.0000 [IU] | PEN_INJECTOR | Freq: Every day | SUBCUTANEOUS | 11 refills | Status: DC

## 2016-11-05 NOTE — Assessment & Plan Note (Signed)
Patient is currently only on the study medication. Not on Coreg or Imdur which are on his list. He reports his other doctor stopped some of his medications but is unable to tell me which ones. Did not bring medications to clinic visit. Will call study physician to determine if it is okay to restart Coreg at least since he does have hx of HF.

## 2016-11-05 NOTE — Assessment & Plan Note (Signed)
A1c 10 today, unchanged from 01/2016. Not on Victoza anymore due to insurance. Continue Metformin BID. Start Lantus 10units daily. Prescribed new meter and supplies. Follow up in pharmacy clinic ASAP. Follow up with PCP in 2-3 weeks.

## 2016-11-05 NOTE — Patient Instructions (Signed)
Your a1c is elevated.  We will start you on Lantus (long acting insulin). Please take 10 units of Lantus daily in the morning.  Please check your sugar: in the morning before eating and before each meal. Please keep a record of it.  PLEASE make a pharmacy clinic visit with Dr. Valentina Lucks ASAP (preferrably next week)  Continue Metformin Please follow up with me in 2-3 weeks.  I sent diabetes supplies to the pharmacy.  I will call your heart doctor to see what medications you are on Please bring all your medications to all clinic visits.   Diabetes Mellitus and Food It is important for you to manage your blood sugar (glucose) level. Your blood glucose level can be greatly affected by what you eat. Eating healthier foods in the appropriate amounts throughout the day at about the same time each day will help you control your blood glucose level. It can also help slow or prevent worsening of your diabetes mellitus. Healthy eating may even help you improve the level of your blood pressure and reach or maintain a healthy weight. General recommendations for healthful eating and cooking habits include:  Eating meals and snacks regularly. Avoid going long periods of time without eating to lose weight.  Eating a diet that consists mainly of plant-based foods, such as fruits, vegetables, nuts, legumes, and whole grains.  Using low-heat cooking methods, such as baking, instead of high-heat cooking methods, such as deep frying. Work with your dietitian to make sure you understand how to use the Nutrition Facts information on food labels. How can food affect me? Carbohydrates  Carbohydrates affect your blood glucose level more than any other type of food. Your dietitian will help you determine how many carbohydrates to eat at each meal and teach you how to count carbohydrates. Counting carbohydrates is important to keep your blood glucose at a healthy level, especially if you are using insulin or taking certain  medicines for diabetes mellitus. Alcohol  Alcohol can cause sudden decreases in blood glucose (hypoglycemia), especially if you use insulin or take certain medicines for diabetes mellitus. Hypoglycemia can be a life-threatening condition. Symptoms of hypoglycemia (sleepiness, dizziness, and disorientation) are similar to symptoms of having too much alcohol. If your health care provider has given you approval to drink alcohol, do so in moderation and use the following guidelines:  Women should not have more than one drink per day, and men should not have more than two drinks per day. One drink is equal to:  12 oz of beer.  5 oz of wine.  1 oz of hard liquor.  Do not drink on an empty stomach.  Keep yourself hydrated. Have water, diet soda, or unsweetened iced tea.  Regular soda, juice, and other mixers might contain a lot of carbohydrates and should be counted. What foods are not recommended? As you make food choices, it is important to remember that all foods are not the same. Some foods have fewer nutrients per serving than other foods, even though they might have the same number of calories or carbohydrates. It is difficult to get your body what it needs when you eat foods with fewer nutrients. Examples of foods that you should avoid that are high in calories and carbohydrates but low in nutrients include:  Trans fats (most processed foods list trans fats on the Nutrition Facts label).  Regular soda.  Juice.  Candy.  Sweets, such as cake, pie, doughnuts, and cookies.  Fried foods. What foods can I eat?  Eat nutrient-rich foods, which will nourish your body and keep you healthy. The food you should eat also will depend on several factors, including:  The calories you need.  The medicines you take.  Your weight.  Your blood glucose level.  Your blood pressure level.  Your cholesterol level. You should eat a variety of foods, including:  Protein.  Lean cuts of  meat.  Proteins low in saturated fats, such as fish, egg whites, and beans. Avoid processed meats.  Fruits and vegetables.  Fruits and vegetables that may help control blood glucose levels, such as apples, mangoes, and yams.  Dairy products.  Choose fat-free or low-fat dairy products, such as milk, yogurt, and cheese.  Grains, bread, pasta, and rice.  Choose whole grain products, such as multigrain bread, whole oats, and brown rice. These foods may help control blood pressure.  Fats.  Foods containing healthful fats, such as nuts, avocado, olive oil, canola oil, and fish. Does everyone with diabetes mellitus have the same meal plan? Because every person with diabetes mellitus is different, there is not one meal plan that works for everyone. It is very important that you meet with a dietitian who will help you create a meal plan that is just right for you. This information is not intended to replace advice given to you by your health care provider. Make sure you discuss any questions you have with your health care provider. Document Released: 07/04/2005 Document Revised: 03/14/2016 Document Reviewed: 09/03/2013 Elsevier Interactive Patient Education  2017 [employer].   Diet Recommendations for Diabetes   Starchy (carb) foods include: Bread, rice, pasta, potatoes, corn, crackers, bagels, muffins, all baked goods.  (Fruits, milk, and yogurt also have carbohydrate, but most of these foods will not spike your blood sugar as the starchy foods will.)  A few fruits do cause high blood sugars; use small portions of bananas (limit to 1/2 at a time), grapes, and most tropical fruits.    Protein foods include: Meat, fish, poultry, eggs, dairy foods, and beans such as pinto and kidney beans (beans also provide carbohydrate).   1. Eat at least 3 meals and 1-2 snacks per day. Never go more than 4-5 hours while awake without eating.  2. Limit starchy foods to TWO per meal and ONE per snack. ONE  portion of a starchy  food is equal to the following:   - ONE slice of bread (or its equivalent, such as half of a hamburger bun).   - 1/2 cup of a "scoopable" starchy food such as potatoes or rice.   - 15 grams of carbohydrate as shown on food label.  3. Both lunch and dinner should include a protein food, a carb food, and vegetables.   - Obtain twice as many veg's as protein or carbohydrate foods for both lunch and dinner.   - Fresh or frozen veg's are best.   - Try to keep frozen veg's on hand for a quick vegetable serving.    4. Breakfast should always include protein.

## 2016-11-08 ENCOUNTER — Telehealth: Payer: Self-pay | Admitting: *Deleted

## 2016-11-08 MED ORDER — INSULIN PEN NEEDLE 31G X 8 MM MISC: 12 refills | Status: DC

## 2016-11-08 NOTE — Telephone Encounter (Signed)
Received fax from [facility] requesting [place]. Derl Barrow, RN

## 2016-11-11 ENCOUNTER — Telehealth: Payer: Self-pay | Admitting: Internal Medicine

## 2016-11-11 NOTE — Telephone Encounter (Signed)
LM for patient that form was ready for pick up at the front desk. Haydan Wedig,CMA

## 2016-11-11 NOTE — Telephone Encounter (Signed)
Please call patient to inform that his form is ready for pick up. Form placed at front desk.

## 2016-11-14 ENCOUNTER — Ambulatory Visit (INDEPENDENT_AMBULATORY_CARE_PROVIDER_SITE_OTHER): Payer: PPO | Admitting: Pharmacist

## 2016-11-14 ENCOUNTER — Encounter: Payer: Self-pay | Admitting: Pharmacist

## 2016-11-14 DIAGNOSIS — E785 Hyperlipidemia, unspecified: Secondary | ICD-10-CM | POA: Diagnosis not present

## 2016-11-14 DIAGNOSIS — E118 Type 2 diabetes mellitus with unspecified complications: Secondary | ICD-10-CM

## 2016-11-14 LAB — BASIC METABOLIC PANEL
BUN: 16 mg/dL (ref 7–25)
CO2: 25 mmol/L (ref 20–31)
Calcium: 9.3 mg/dL (ref 8.6–10.3)
Chloride: 100 mmol/L (ref 98–110)
Creat: 0.93 mg/dL (ref 0.70–1.25)
Glucose, Bld: 191 mg/dL — ABNORMAL HIGH (ref 65–99)
POTASSIUM: 4.8 mmol/L (ref 3.5–5.3)
SODIUM: 134 mmol/L — AB (ref 135–146)

## 2016-11-14 LAB — LIPID PANEL
CHOL/HDL RATIO: 5.9 ratio — AB (ref <5.0)
Cholesterol: 249 mg/dL — ABNORMAL HIGH (ref <200)
HDL: 42 mg/dL (ref >40)
LDL CALC: 178 mg/dL — AB (ref <100)
Triglycerides: 146 mg/dL (ref <150)
VLDL: 29 mg/dL (ref <30)

## 2016-11-14 MED ORDER — INSULIN GLARGINE 100 UNIT/ML SOLOSTAR PEN: 15.0000 [IU] | PEN_INJECTOR | Freq: Every day | SUBCUTANEOUS | 11 refills | Status: DC

## 2016-11-14 NOTE — Assessment & Plan Note (Signed)
Diabetes longstanding diagnosed currently uncontrolled. Patient denies hypoglycemic events and is unable to verbalize appropriate hypoglycemia management plan. Patient reports adherence with medication. Control is suboptimal due to diet and insulin resistance. Counseled on signs, symptoms and treatment of hypoglycemia.  Increased dose of basal insulin Lantus (insulin glargine). Patient will continue to titrate 1 unit/day if fasting CBGs > 150mg /dl until fasting CBGs reach goal or next visit.  Instructed patient to call clinic if he experiences signs/symptoms of hypoglycemia. Next A1C anticipated in 3 months.

## 2016-11-14 NOTE — Progress Notes (Addendum)
    S:    Patient arrives in good spirits ambulating without assistance.  Presents for diabetes evaluation, education, and management at the request of Dr Dallas Schimke. Patient was referred on 11/05/16.  Patient was last seen by Primary Care Provider on 11/05/16.   Patient reports adherence with medications.  Current diabetes medications include: metformin 1000 mg BID, Lantus 10 units daily Current hypertension/heart failure/CAD medications include: Entresto 97/103 mg BID, isosorbide 30 mg daily (not taking), furosemide 40 mg daily (not taking), carvedilol 25 mg BID  Patient denies hypoglycemic events. Denies treatment knowledge  Patient reported dietary habits: Eats 3 meals/day Breakfast:cereal Lunch:bowl of chicken noodle soup.   Dinner:meat and vegetables Snacks:popcorn, chips Drinks:water, soda regular 1x/day.    Patient reported exercise habits: limited exercise.  Just walking to mailbox everyday    Patient reports nocturia 1x/night  Patient denies neuropathy. Patient denies visual changes. Patient reports self foot exams.   Does report occasional shortness of breath when walking to the mailbox.  Following heart failure with Dr Johnsie Cancel.  Denies sign/symptoms of edema or chest pain.    O:  Lab Results  Component Value Date   HGBA1C 10.0 11/05/2016   Vitals:   11/14/16 0849  BP: 130/80    Home fasting CBG: 273, 256, 261, 245 Before Lunch: 142, 202, 279. Before: 312, 328, 338  A/P: Diabetes longstanding diagnosed currently uncontrolled. Patient denies hypoglycemic events and is unable to verbalize appropriate hypoglycemia management plan. Patient reports adherence with medication. Control is suboptimal due to diet and insulin resistance. Counseled on signs, symptoms and treatment of hypoglycemia.  Increased dose of basal insulin Lantus (insulin glargine). Patient will continue to titrate 1 unit/day if fasting CBGs > 150mg /dl until fasting CBGs reach goal or next visit.   Instructed patient to call clinic if he experiences signs/symptoms of hypoglycemia. Next A1C anticipated in 3 months.    ASCVD risk greater than 7.5% with LDL (2015) above goal <70 mg/dL. Continued Aspirin 81 mg and Continued atorvastatin 80 mg. Will obtain fasting lipid panel today.    Hypertension currently controlled at todays visit.  Patient reports adherence with medication.   Heart failure with LVEF 25-30%:  Patient followed by Dr Johnsie Cancel and currently denies signs/symptoms of edema or chest pain.  Does report occasional shortness of breath upon exertion.  Last BMET > 6 months ago.  Will obtain BMET today and consider addition of spironolactone at next visit.  Patient unsure if still taking isosorbide.  Instructed him to bring all medications to next visit.   Written patient instructions provided.  Total time in face to face counseling 40 minutes.   Follow up in Pharmacist Clinic Visit in 2 weeks.   Patient seen with Bennye Alm, PharmD, BCPS and Mikael Spray PharmD Candidate.      LDL was consistent with past elevations.   Readdress adherence and consider combination therapy with ezetimibe at next follow up.

## 2016-11-14 NOTE — Progress Notes (Signed)
Patient ID: Blake Wilson, male   DOB: 1951/02/09, 66 y.o.   MRN: [id number] Reviewed: Agree with Dr. Graylin Shiver documentation and management.

## 2016-11-14 NOTE — Patient Instructions (Addendum)
Increase Lantus to 15 units daily.  Then increase by 1 unit per day if fasting blood glucose (before breakfast blood sugar) is greater than 150.  Followup with Dr Valentina Lucks in 2 weeks.  Bring all medications to next visit.     Please call clinic if you experience signs/symptoms of low blood sugars.

## 2016-11-28 ENCOUNTER — Encounter: Payer: Self-pay | Admitting: Pharmacist

## 2016-11-28 ENCOUNTER — Ambulatory Visit (INDEPENDENT_AMBULATORY_CARE_PROVIDER_SITE_OTHER): Payer: PPO | Admitting: Pharmacist

## 2016-11-28 DIAGNOSIS — I420 Dilated cardiomyopathy: Secondary | ICD-10-CM | POA: Diagnosis not present

## 2016-11-28 DIAGNOSIS — I10 Essential (primary) hypertension: Secondary | ICD-10-CM

## 2016-11-28 DIAGNOSIS — E118 Type 2 diabetes mellitus with unspecified complications: Secondary | ICD-10-CM | POA: Diagnosis not present

## 2016-11-28 MED ORDER — INSULIN GLARGINE 100 UNIT/ML SOLOSTAR PEN: 23.0000 [IU] | PEN_INJECTOR | Freq: Every day | SUBCUTANEOUS | 11 refills | Status: DC

## 2016-11-28 NOTE — Progress Notes (Signed)
Patient ID: Blake Wilson, male   DOB: 06-06-1951, 66 y.o.   MRN: [id number] Reviewed: Agree with Dr. Graylin Shiver documentation and management.

## 2016-11-28 NOTE — Progress Notes (Signed)
    S:    Patient arrives in good spirits ambulating without assistance.  Presents for diabetes evaluation, education, and management at the request of Dr Dallas Schimke. Patient was referred on 11/05/16.  Patient was last seen by Primary Care Provider on 11/05/16 and in pharmacy clinic on 11/14/16.   Today he Reports he has not seen cardiology but saw his heart failure study coordinators in January.    Patient reports adherence with medications but has questionable adherence with his medications especially those for heart failure.  Patient did not bring medications to clinic today as instructed.   Current diabetes medications include: Lantus 23 units daily (increasing by 1 unit/day until CBG < 150 mg/dL), metformin 1000 mg BID Previously Tried DM Medications: Victoza (could not afford)   Current hypertension/Heart Failure medications include: Entresto, isosorbide (unsure if taking), furosemide (not taking), carvedilol  Patient denies hypoglycemic events. Reports proper treatment knowledge  Patient reported dietary habits: Eats 3 meals/day Breakfast:cereal/oatmeal Lunch:sandwhich, salad Dinner:polish sausage and soup Snacks:mini bag popcorn  Drinks: water, regular soda 1x/day.  Now drinking flavored soda from [employer].   Patient reported exercise habits: limited exercise.    Patient denies nocturia.  Patient denies neuropathy. Patient denies visual changes. Patient reports self foot exams. Denies changes.  Denies chest pain. Denies SOB. Denies peripheral edema  O:  Patient has mild 1+ edema in his right lower leg.   Lab Results  Component Value Date   HGBA1C 10.0 11/05/2016   Vitals:   11/28/16 0849  BP: 138/74    CBGs (current --> previous readings):  Home fasting CBG: 167, 179, 178, 165, 171, 234, 162, 195, 261, 235  Before Lunch CBG: 199, 163, 211,  Before Dinner: 131, 259, 277, 185, 199,m 274, 190, 305  A/P: Diabetes longstanding diagnosed currently uncontrolled but CBGs have  improved since starting Lantus. Patient denies hypoglycemic events and is able to verbalize appropriate hypoglycemia management plan. Patient reports adherence with medication. Control is suboptimal due to diet and insulin resistance. Continued basal insulin Lantus (insulin glargine) 23 units daily. Patient will continue to titrate 1 unit/day if fasting CBGs > 150mg /dl until fasting CBGs reach goal or next visit with a maximum dose of 30 units daily. Next A1C anticipated 01/2017.  Instructed to continue checking CBGs 3 times daily.  Counseled on signs/symptoms/treatment of hypoglycemia.   Hypertension longstanding diagnosed currently controlled.  Patient reports adherence with medication. Patient has questionable adherence and is unsure of which medications he is currently taking.  Asked to bring all medications to next visit.  HFrEF: Patient has questionable adherence and is unsure of which medications he is currently taking. Instructed patient to schedule followup appointment with cardiology.  Patient has trace edema in right leg.  Have asked patient to bring all medications to next visit so heart failure regimen may be reviewed.   Written patient instructions provided.  Total time in face to face counseling 45 minutes.   Follow up in Pharmacist Clinic Visit in 2 weeks.   Patient seen with Maylon Cos, PharmD PGY1 Resident. and Bennye Alm, PharmD PGY2 Resident.

## 2016-11-28 NOTE — Assessment & Plan Note (Signed)
HFrEF: Patient has questionable adherence and is unsure of which medications he is currently taking. Instructed patient to schedule followup appointment with cardiology.  Patient has trace edema in right leg.  Have asked patient to bring all medications to next visit so heart failure regimen may be reviewed

## 2016-11-28 NOTE — Assessment & Plan Note (Signed)
Hypertension longstanding diagnosed currently controlled.  Patient reports adherence with medication. Patient has questionable adherence and is unsure of which medications he is currently taking.  Asked to bring all medications to next visit.

## 2016-11-28 NOTE — Assessment & Plan Note (Signed)
Diabetes longstanding diagnosed currently uncontrolled but CBGs have improved since starting Lantus. Patient denies hypoglycemic events and is able to verbalize appropriate hypoglycemia management plan. Patient reports adherence with medication. Control is suboptimal due to diet and insulin resistance. Continued basal insulin Lantus (insulin glargine) 23 units daily. Patient will continue to titrate 1 unit/day if fasting CBGs > 150mg /dl until fasting CBGs reach goal or next visit with a maximum dose of 30 units daily. Next A1C anticipated 01/2017.  Instructed to continue checking CBGs 3 times daily.  Counseled on signs/symptoms/treatment of hypoglycemia.

## 2016-11-28 NOTE — Patient Instructions (Addendum)
Continue Lantus 23 units. You can increase by 1 unit per day until fasting blood glucose (before breakfast) are less than 150.  Do not increase to greater than 30 units daily.    Continue checking your blood glucose 3 times daily   Please bring all of your medications to your next visit  Followup with Dr Valentina Lucks in 2 weeks

## 2016-12-02 ENCOUNTER — Other Ambulatory Visit: Payer: Self-pay | Admitting: Internal Medicine

## 2016-12-04 NOTE — Progress Notes (Signed)
   [facility] Phone: [phone]    Date of Visit: 12/06/2016   HPI:  DM2:  - Medication: Lantus 30 units. Has been titrating up to 30 units which he gave himself today. He took 29units yesterday. Also taking Metformin.  - brought cbg log. cbgs mainly in 159-205 for fasting AM but seems to be responding to the up-titration of Lantus.   - has been walking daily although he is not able to tell me for how long - reports he has stopped drinking soda but otherwise is not watching carbohydrate intake.   - patient did not bring medications to the visit.    Of note:  Spoke with Crystal CMA from trial. Patient is supposed to be on these medications in addition to entresto.  - coreg 25mg  BID - Imdur 30mg  qD  - Lasix 40mg  qd   ROS: See HPI.  [place]:  PMH:  HTN CAD Congestive Dilated Cardiomyopathy Systolic Heart Failure  DM2 Equinus Deformity of Foot Onychomycosis Obesity  SLE (in remission)   PHYSICAL EXAM: BP 130/70   Pulse 68   Temp 97.7 F (36.5 C) (Oral)   Ht 6' (1.829 m)   Wt 218 lb (98.9 kg)   SpO2 96%   BMI 29.57 kg/m  GEN: NAD HEENT: Atraumatic, normocephalic, neck supple, EOMI, sclera clear  CV: RRR, no murmurs, rubs, or gallops PULM: CTAB, normal effort EXTR: No lower extremity edema or calf tenderness PSYCH: Mood and affect euthymic, normal rate and volume of speech NEURO: Awake, alert, no focal deficits grossly, normal speech  Diabetic Foot Exam - Simple   Simple Foot Form Visual Inspection No deformities, no ulcerations, no other skin breakdown bilaterally:  Yes Sensation Testing Intact to touch and monofilament testing bilaterally:  Yes Pulse Check Posterior Tibialis and Dorsalis pulse intact bilaterally:  Yes Comments     ASSESSMENT/PLAN:   Diabetes (HCC) CBGs still elevated but overall improving with review of cbg log.  - Continue Lantus 30 units daily for two days, then increase by 1 unit every 3 days if cbg  >130. Do not go above 35 units. Discussed how to treat lows and to call clinic if he has lows.  - continue metformin - provided information on dm diet - encouraged to increase exercise - follow up in 2 weeks.     Smiley Houseman, MD PGY [place]

## 2016-12-05 NOTE — Progress Notes (Signed)
Patient ID: Blake Wilson, male   DOB: 10/14/1951, 66 y.o.   MRN: [id number] Reviewed: Agree with Dr. Graylin Shiver documentation and management.

## 2016-12-06 ENCOUNTER — Encounter: Payer: Self-pay | Admitting: Internal Medicine

## 2016-12-06 ENCOUNTER — Ambulatory Visit (INDEPENDENT_AMBULATORY_CARE_PROVIDER_SITE_OTHER): Payer: PPO | Admitting: Internal Medicine

## 2016-12-06 VITALS — BP 130/70 | HR 68 | Temp 97.7°F | Ht 72.0 in | Wt 218.0 lb

## 2016-12-06 DIAGNOSIS — Z794 Long term (current) use of insulin: Secondary | ICD-10-CM

## 2016-12-06 DIAGNOSIS — E118 Type 2 diabetes mellitus with unspecified complications: Secondary | ICD-10-CM

## 2016-12-06 NOTE — Patient Instructions (Signed)
Please continue 30 units of Lantus a day for 2 more days. Then, only increase your Lantus dose by 1 unit every 3 days if you morning sugar is greater than 130. DO NOT go over 35 units of Lantus. If you have low sugars, please cut back on the dose and call our clinic.  Below is information about diet and diabetes.  It is very important that you increase your physical activity.   Please follow up in 2 weeks.   Diet Recommendations for Diabetes   Starchy (carb) foods include: Bread, rice, pasta, potatoes, corn, crackers, bagels, muffins, all baked goods.  (Fruits, milk, and yogurt also have carbohydrate, but most of these foods will not spike your blood sugar as the starchy foods will.)  A few fruits do cause high blood sugars; use small portions of bananas (limit to 1/2 at a time), grapes, and most tropical fruits.    Protein foods include: Meat, fish, poultry, eggs, dairy foods, and beans such as pinto and kidney beans (beans also provide carbohydrate).   1. Eat at least 3 meals and 1-2 snacks per day. Never go more than 4-5 hours while awake without eating.  2. Limit starchy foods to TWO per meal and ONE per snack. ONE portion of a starchy  food is equal to the following:   - ONE slice of bread (or its equivalent, such as half of a hamburger bun).   - 1/2 cup of a "scoopable" starchy food such as potatoes or rice.   - 15 grams of carbohydrate as shown on food label.  3. Both lunch and dinner should include a protein food, a carb food, and vegetables.   - Obtain twice as many veg's as protein or carbohydrate foods for both lunch and dinner.   - Fresh or frozen veg's are best.   - Try to keep frozen veg's on hand for a quick vegetable serving.    4. Breakfast should always include protein.

## 2016-12-10 NOTE — Assessment & Plan Note (Signed)
CBGs still elevated but overall improving with review of cbg log.  - Continue Lantus 30 units daily for two days, then increase by 1 unit every 3 days if cbg >130. Do not go above 35 units. Discussed how to treat lows and to call clinic if he has lows.  - continue metformin - provided information on dm diet - encouraged to increase exercise - follow up in 2 weeks.

## 2016-12-12 ENCOUNTER — Ambulatory Visit: Payer: PPO | Admitting: Pharmacist

## 2016-12-13 ENCOUNTER — Emergency Department (HOSPITAL_COMMUNITY): Payer: PPO

## 2016-12-13 ENCOUNTER — Encounter (HOSPITAL_COMMUNITY): Payer: Self-pay

## 2016-12-13 ENCOUNTER — Inpatient Hospital Stay (HOSPITAL_COMMUNITY)
Admission: EM | Admit: 2016-12-13 | Discharge: 2016-12-18 | DRG: 065 | Disposition: A | Discharge status: Skilled Nursing Facility | Payer: PPO | Attending: Family Medicine | Admitting: Family Medicine

## 2016-12-13 DIAGNOSIS — R269 Unspecified abnormalities of gait and mobility: Secondary | ICD-10-CM | POA: Diagnosis not present

## 2016-12-13 DIAGNOSIS — R748 Abnormal levels of other serum enzymes: Secondary | ICD-10-CM | POA: Diagnosis not present

## 2016-12-13 DIAGNOSIS — E1140 Type 2 diabetes mellitus with diabetic neuropathy, unspecified: Secondary | ICD-10-CM | POA: Diagnosis not present

## 2016-12-13 DIAGNOSIS — N390 Urinary tract infection, site not specified: Secondary | ICD-10-CM | POA: Diagnosis not present

## 2016-12-13 DIAGNOSIS — I638 Other cerebral infarction: Secondary | ICD-10-CM | POA: Diagnosis not present

## 2016-12-13 DIAGNOSIS — S0990XA Unspecified injury of head, initial encounter: Secondary | ICD-10-CM | POA: Diagnosis not present

## 2016-12-13 DIAGNOSIS — R1310 Dysphagia, unspecified: Secondary | ICD-10-CM | POA: Diagnosis present

## 2016-12-13 DIAGNOSIS — R9431 Abnormal electrocardiogram [ECG] [EKG]: Secondary | ICD-10-CM | POA: Diagnosis not present

## 2016-12-13 DIAGNOSIS — I4891 Unspecified atrial fibrillation: Secondary | ICD-10-CM | POA: Diagnosis present

## 2016-12-13 DIAGNOSIS — G8191 Hemiplegia, unspecified affecting right dominant side: Secondary | ICD-10-CM | POA: Diagnosis present

## 2016-12-13 DIAGNOSIS — Z794 Long term (current) use of insulin: Secondary | ICD-10-CM

## 2016-12-13 DIAGNOSIS — I63019 Cerebral infarction due to thrombosis of unspecified vertebral artery: Secondary | ICD-10-CM | POA: Diagnosis not present

## 2016-12-13 DIAGNOSIS — Z7982 Long term (current) use of aspirin: Secondary | ICD-10-CM | POA: Diagnosis not present

## 2016-12-13 DIAGNOSIS — M329 Systemic lupus erythematosus, unspecified: Secondary | ICD-10-CM | POA: Diagnosis not present

## 2016-12-13 DIAGNOSIS — I6340 Cerebral infarction due to embolism of unspecified cerebral artery: Secondary | ICD-10-CM | POA: Diagnosis not present

## 2016-12-13 DIAGNOSIS — R319 Hematuria, unspecified: Secondary | ICD-10-CM | POA: Diagnosis not present

## 2016-12-13 DIAGNOSIS — R531 Weakness: Secondary | ICD-10-CM | POA: Diagnosis not present

## 2016-12-13 DIAGNOSIS — I5022 Chronic systolic (congestive) heart failure: Secondary | ICD-10-CM | POA: Diagnosis not present

## 2016-12-13 DIAGNOSIS — I6789 Other cerebrovascular disease: Secondary | ICD-10-CM | POA: Diagnosis not present

## 2016-12-13 DIAGNOSIS — R4701 Aphasia: Secondary | ICD-10-CM | POA: Diagnosis not present

## 2016-12-13 DIAGNOSIS — I639 Cerebral infarction, unspecified: Secondary | ICD-10-CM | POA: Diagnosis not present

## 2016-12-13 DIAGNOSIS — I69319 Unspecified symptoms and signs involving cognitive functions following cerebral infarction: Secondary | ICD-10-CM

## 2016-12-13 DIAGNOSIS — M6282 Rhabdomyolysis: Secondary | ICD-10-CM | POA: Diagnosis not present

## 2016-12-13 DIAGNOSIS — Z87891 Personal history of nicotine dependence: Secondary | ICD-10-CM | POA: Diagnosis not present

## 2016-12-13 DIAGNOSIS — I6302 Cerebral infarction due to thrombosis of basilar artery: Secondary | ICD-10-CM | POA: Diagnosis not present

## 2016-12-13 DIAGNOSIS — R404 Transient alteration of awareness: Secondary | ICD-10-CM | POA: Diagnosis not present

## 2016-12-13 DIAGNOSIS — I110 Hypertensive heart disease with heart failure: Secondary | ICD-10-CM | POA: Diagnosis present

## 2016-12-13 DIAGNOSIS — I2510 Atherosclerotic heart disease of native coronary artery without angina pectoris: Secondary | ICD-10-CM | POA: Diagnosis present

## 2016-12-13 DIAGNOSIS — R29810 Facial weakness: Secondary | ICD-10-CM | POA: Diagnosis not present

## 2016-12-13 DIAGNOSIS — R4182 Altered mental status, unspecified: Secondary | ICD-10-CM | POA: Diagnosis present

## 2016-12-13 DIAGNOSIS — I420 Dilated cardiomyopathy: Secondary | ICD-10-CM | POA: Diagnosis not present

## 2016-12-13 DIAGNOSIS — I63011 Cerebral infarction due to thrombosis of right vertebral artery: Secondary | ICD-10-CM | POA: Diagnosis not present

## 2016-12-13 DIAGNOSIS — I6322 Cerebral infarction due to unspecified occlusion or stenosis of basilar arteries: Secondary | ICD-10-CM | POA: Diagnosis not present

## 2016-12-13 DIAGNOSIS — E1151 Type 2 diabetes mellitus with diabetic peripheral angiopathy without gangrene: Secondary | ICD-10-CM | POA: Diagnosis not present

## 2016-12-13 DIAGNOSIS — R471 Dysarthria and anarthria: Secondary | ICD-10-CM | POA: Diagnosis present

## 2016-12-13 DIAGNOSIS — W19XXXA Unspecified fall, initial encounter: Secondary | ICD-10-CM | POA: Diagnosis not present

## 2016-12-13 DIAGNOSIS — I63212 Cerebral infarction due to unspecified occlusion or stenosis of left vertebral arteries: Secondary | ICD-10-CM | POA: Diagnosis not present

## 2016-12-13 DIAGNOSIS — I679 Cerebrovascular disease, unspecified: Secondary | ICD-10-CM | POA: Diagnosis not present

## 2016-12-13 DIAGNOSIS — I340 Nonrheumatic mitral (valve) insufficiency: Secondary | ICD-10-CM | POA: Diagnosis not present

## 2016-12-13 DIAGNOSIS — N179 Acute kidney failure, unspecified: Secondary | ICD-10-CM | POA: Diagnosis not present

## 2016-12-13 DIAGNOSIS — E785 Hyperlipidemia, unspecified: Secondary | ICD-10-CM | POA: Diagnosis not present

## 2016-12-13 DIAGNOSIS — I63013 Cerebral infarction due to thrombosis of bilateral vertebral arteries: Secondary | ICD-10-CM | POA: Diagnosis not present

## 2016-12-13 HISTORY — DX: Atherosclerotic heart disease of native coronary artery without angina pectoris: I25.10

## 2016-12-13 LAB — BASIC METABOLIC PANEL
Anion gap: 11 (ref 5–15)
BUN: 12 mg/dL (ref 6–20)
CO2: 22 mmol/L (ref 22–32)
Calcium: 9.1 mg/dL (ref 8.9–10.3)
Chloride: 105 mmol/L (ref 101–111)
Creatinine, Ser: 0.9 mg/dL (ref 0.61–1.24)
GFR calc Af Amer: >60 mL/min (ref >60)
GFR calc non Af Amer: >60 mL/min (ref >60)
Glucose, Bld: 124 mg/dL — ABNORMAL HIGH (ref 65–99)
Potassium: 4.1 mmol/L (ref 3.5–5.1)
Sodium: 138 mmol/L (ref 135–145)

## 2016-12-13 LAB — CBC WITH DIFFERENTIAL/PLATELET
Basophils Absolute: 0 10*3/uL (ref 0.0–0.1)
Basophils Relative: 0 %
Eosinophils Absolute: 0 10*3/uL (ref 0.0–0.7)
Eosinophils Relative: 1 %
HCT: 40 % (ref 39.0–52.0)
Hemoglobin: 13.9 g/dL (ref 13.0–17.0)
Lymphocytes Relative: 11 %
Lymphs Abs: 1 10*3/uL (ref 0.7–4.0)
MCH: 31 pg (ref 26.0–34.0)
MCHC: 34.8 g/dL (ref 30.0–36.0)
MCV: 89.1 fL (ref 78.0–100.0)
Monocytes Absolute: 0.6 10*3/uL (ref 0.1–1.0)
Monocytes Relative: 7 %
Neutro Abs: 7.2 10*3/uL (ref 1.7–7.7)
Neutrophils Relative %: 81 %
Platelets: 355 10*3/uL (ref 150–400)
RBC: 4.49 MIL/uL (ref 4.22–5.81)
RDW: 13.6 % (ref 11.5–15.5)
WBC: 8.8 10*3/uL (ref 4.0–10.5)

## 2016-12-13 LAB — URINALYSIS, ROUTINE W REFLEX MICROSCOPIC
Bilirubin Urine: NEGATIVE
Glucose, UA: NEGATIVE mg/dL
Ketones, ur: 5 mg/dL — AB
Nitrite: NEGATIVE
Protein, ur: 100 mg/dL — AB
Specific Gravity, Urine: 1.024 (ref 1.005–1.030)
Squamous Epithelial / LPF: NONE SEEN
pH: 6 (ref 5.0–8.0)

## 2016-12-13 LAB — CK: Total CK: 1445 U/L — ABNORMAL HIGH (ref 49–397)

## 2016-12-13 LAB — ETHANOL: Alcohol, Ethyl (B): <5 mg/dL (ref <5)

## 2016-12-13 LAB — GLUCOSE, CAPILLARY: GLUCOSE-CAPILLARY: 151 mg/dL — AB (ref 65–99)

## 2016-12-13 MED ORDER — LORAZEPAM 1 MG PO TABS: 1.0000 mg | ORAL_TABLET | Freq: Four times a day (QID) | ORAL | Status: AC | PRN

## 2016-12-13 MED ORDER — INSULIN ASPART 100 UNIT/ML ~~LOC~~ SOLN
0.0000 [IU] | Freq: Three times a day (TID) | SUBCUTANEOUS | Status: DC
  Administered 2016-12-14 – 2016-12-18 (×3): 2 [IU] via SUBCUTANEOUS

## 2016-12-13 MED ORDER — VITAMIN B-1 100 MG PO TABS
100.0000 mg | ORAL_TABLET | Freq: Every day | ORAL | Status: DC
  Administered 2016-12-13 – 2016-12-14 (×2): 100 mg via ORAL
  Filled 2016-12-13 (×2): qty 1

## 2016-12-13 MED ORDER — THIAMINE HCL 100 MG/ML IJ SOLN
100.0000 mg | Freq: Every day | INTRAMUSCULAR | Status: DC
  Administered 2016-12-15: 100 mg via INTRAVENOUS
  Filled 2016-12-13 (×2): qty 2

## 2016-12-13 MED ORDER — ENOXAPARIN SODIUM 40 MG/0.4ML ~~LOC~~ SOLN
40.0000 mg | SUBCUTANEOUS | Status: DC
  Administered 2016-12-13 – 2016-12-17 (×5): 40 mg via SUBCUTANEOUS
  Filled 2016-12-13 (×5): qty 0.4

## 2016-12-13 MED ORDER — ADULT MULTIVITAMIN W/MINERALS CH
1.0000 | ORAL_TABLET | Freq: Every day | ORAL | Status: DC
  Administered 2016-12-13 – 2016-12-18 (×6): 1 via ORAL
  Filled 2016-12-13 (×6): qty 1

## 2016-12-13 MED ORDER — SODIUM CHLORIDE 0.9 % IV SOLN
INTRAVENOUS | Status: AC
  Administered 2016-12-13 – 2016-12-14 (×2): via INTRAVENOUS

## 2016-12-13 MED ORDER — INSULIN GLARGINE 100 UNIT/ML ~~LOC~~ SOLN
5.0000 [IU] | Freq: Every day | SUBCUTANEOUS | Status: DC
  Administered 2016-12-13 – 2016-12-17 (×5): 5 [IU] via SUBCUTANEOUS
  Filled 2016-12-13 (×7): qty 0.05

## 2016-12-13 MED ORDER — ASPIRIN 81 MG PO CHEW
81.0000 mg | CHEWABLE_TABLET | Freq: Every day | ORAL | Status: DC
  Administered 2016-12-13 – 2016-12-15 (×3): 81 mg via ORAL
  Filled 2016-12-13 (×3): qty 1

## 2016-12-13 MED ORDER — LORAZEPAM 2 MG/ML IJ SOLN: 1.0000 mg | Freq: Four times a day (QID) | INTRAMUSCULAR | Status: AC | PRN

## 2016-12-13 MED ORDER — FOLIC ACID 1 MG PO TABS
1.0000 mg | ORAL_TABLET | Freq: Every day | ORAL | Status: DC
  Administered 2016-12-13 – 2016-12-18 (×6): 1 mg via ORAL
  Filled 2016-12-13 (×6): qty 1

## 2016-12-13 MED ORDER — DEXTROSE 5 % IV SOLN
1.0000 g | INTRAVENOUS | Status: DC
  Administered 2016-12-13: 1 g via INTRAVENOUS
  Filled 2016-12-13: qty 10

## 2016-12-13 MED ORDER — TRAVOPROST 0.004 % OP SOLN
1.0000 [drp] | Freq: Every day | OPHTHALMIC | Status: DC
  Administered 2016-12-13 – 2016-12-17 (×5): 1 [drp] via OPHTHALMIC
  Filled 2016-12-13 (×2): qty 5

## 2016-12-13 MED ORDER — SACUBITRIL-VALSARTAN 97-103 MG PO TABS
1.0000 | ORAL_TABLET | Freq: Two times a day (BID) | ORAL | Status: DC
  Administered 2016-12-13 – 2016-12-18 (×9): 1 via ORAL
  Filled 2016-12-13 (×11): qty 1

## 2016-12-13 MED ORDER — SODIUM CHLORIDE 0.9 % IV BOLUS (SEPSIS)
1000.0000 mL | Freq: Once | INTRAVENOUS | Status: AC
  Administered 2016-12-13: 1000 mL via INTRAVENOUS

## 2016-12-13 NOTE — Progress Notes (Addendum)
FPTS Interim Progress Note  S: Went to check on patient. He was resting comfortably in bed. He responds to questions appropriately, though I frequently have to ask him to repeat himself because it is difficult to understand his words. He speaks more clearly when asked to repeat himself.  He states he slipped in some water and fell down earlier, did not hit his head, did not lose consciousness, could not get up.  He denies palpitations or dizziness at the time he fell. He states a neighbor found him, though difficult to understand how the neighbor found him or how long he was down.   He is alert to person, place, and time and understands he is in the hospital being treated for a UTI. Overall pleasant and appropriate, though with intermittently stuttering speech (unknown baseline), with no acute complaints.  O: BP 122/87 (BP Location: Right Arm)   Pulse 70   Temp 97.4 F (36.3 C) (Oral)   Resp 18   SpO2 100%   GEN: rests comfortably in bed, NAD CARD: RRR, no m/r/g PULM: CTA bil, no W/R/R NEURO: CN II-XII intact, no dysmetria, 5/5 strength in 4 extremities PSYCH: AAOx3, thought process linear, affect appropriate, intermittently stutters   A/P: Overall patient appears stable and is in no distress. Will continue to monitor and follow plan as documented in H&P.  Everrett Coombe, MD 12/13/2016, 10:07 PM  PGY-1, [facility] pager [phone]

## 2016-12-13 NOTE — ED Notes (Signed)
Admitting MD at bedside.

## 2016-12-13 NOTE — Progress Notes (Signed)
Pharmacy Antibiotic Note  Blake Wilson is a 66 y.o. male admitted on 12/13/2016 with UTI.  Pharmacy has been consulted for ceftriaxone dosing. Afebrile, WBC wnl.  Plan: Ceftriaxone 1g IV q24h Monitor clinical progress, c/s, LOT Not renally adjusted - Rx will s/o consult     Temp (24hrs), Avg:97.8 F (36.6 C), Min:97.8 F (36.6 C), Max:97.8 F (36.6 C)   Recent Labs Lab 12/13/16 1251  WBC 8.8  CREATININE 0.90    Estimated Creatinine Clearance: 98.3 mL/min (by C-G formula based on SCr of 0.9 mg/dL).    No Known Allergies  Elicia Lamp, PharmD, BCPS Clinical Pharmacist 12/13/2016 5:56 PM

## 2016-12-13 NOTE — ED Notes (Signed)
Roanna Banning - only area contact (cousin), states brothers live in [place].  [phone]

## 2016-12-13 NOTE — ED Triage Notes (Signed)
[facility]- pt coming from home after he was found in the floor. Pt normally visits office staff daily but was last seen normal by staff on Monday. Pt smells strongly of urine. Follows commands. A&O X4 per EMS.

## 2016-12-13 NOTE — H&P (Signed)
[facility] Admission History and Physical Service Pager: [phone]  Patient name: Blake Wilson Medical record number: [id number] Date of birth: 1951/09/16 Age: 66 y.o. Gender: male  Primary Care Provider: Luiz Blare, DO Consultants: None Code Status: Full  Chief Complaint: Weakness, AMS  Assessment and Plan: Blake Wilson is a 66 y.o. male presenting with weakness/AMS after fall. PMH is significant for CAD, HFrEF, Dilated Cardiomyopathy, HTN, T2DM with neuropathy, Equinus Deformity of Foot, Alcohol Abuse, SLE, Obesity, Glaucoma, and History of Low TSH.  UTI: Too numerous to count WBC on UA with leukocytes but rare bacteria and negative nitrites. No CVA tenderness, leukocytosis or fever to suggest more systemic infection.  - Ordered ceftriaxone 1 mg IV to be given 2/23. Likely transition to keflex 2/24.  - Obtain gram stain - Follow-up urine cultures  Possible AMS: AOx3 on admission but slowed speech. Appears to know his medications from recent office visits but incorrectly named his lantus dose (10 U vs 30 u recently recommended). Cannot elaborate on events that led to hospitalization and how he has been feeling lately. Attempted to reach Emergency contact and cousin to better establish baseline but no answers. Concern for dementia given chronic ischemic changes on CT.  - Treat UTI and assess for improvement in conversation - Consider further neuroimaging vs Neurology consult - Blood cultures x 2 ordered - UDS ordered  - Obtain TSH, B12, folate, HIV, RPR  Stutter: New per patient. No acute changes on head CT but chronic ischemic changes noted within multiple areas. No aphasia but speaking slowly in short answers. Consider central cause vs. emotional, e.g., new stressors.  - Consider brain MRI if not improved with treatment of infection  Weakness: Sensation intact in feet bilaterally. History of diabetic neuropathy but denies taking his prescribed  gabapentin. - Thiamine per CIWA protocol - Orthostatic vital signs - PT/OT consults  Elevated CK/Possible Rhabdomyolysis: Suspect 2/2 prolonged immobilization after fall. No complaint of myalgias and no AKI. - s/p 1 L IVFs in ED - Light IVFs overnight given reduced EF (75 mL/hr x 12 hours) - Recheck CK in am - Hold lasix  Dilated Cardiomyopathy/CHF: Last EF 25-30% in 2015. Patient part of a research trial and is supposed to be on entresto, coreg, imdur but only reports being on entresto. Also reports taking lasix 40 mg daily.  - Continue entresto BID - Would restart imdur if becomes hypertensive - Holding lasix given likely rhabdomyolysis  Diabetes: Last A1c 10.0 on 11/05/16. Reports taking 10 U lantus, though was recommended to increase to 30 U at last Office Visit. Takes metformin 1000 mg BID.  - Ordered lantus 5 U nightly, half of reported dose - Sensitive SSI - Hold metformin in case of imaging  History of alcohol abuse: Reports only occasional drinking. Last drink 2/21.  - Obtain ethanol level - CIWA protocol  Hx CAD:  - Continue home aspirin - Hold lipitor in setting of likely rhabdomyolysis  FEN/GI: carb modified/heart healthy diet Prophylaxis: Lovenox  Disposition: Home pending clinical improvement  History of Present Illness:  Blake Wilson is a 66 y.o. male presenting after being found down in his apartment complex. He says he slipped on wet ground then was too weak to get up. A neighbor found him this morning and called EMS. Per report, patient last known normal on Monday--he usually visits office staff daily. He says he slipped on wet ground and did not lose consciousness or hit his head. He was too  weak to get up. He denies concerns except increased weakness over last 3 weeks. He also reports increased urinary frequency. No chest pain, shortness of breath, abdominal pain.   In the ED, patient had head CT negative for acute changes. UA with too numerous to count WBC  and RBC, rare bacteria, small leukocytes. No leukocytosis or fever. Electrolytes normal. CK found to be elevated.   Review Of Systems: Per HPI with the following additions:   Review of Systems  Constitutional: Negative for chills and fever.  HENT: Negative for ear pain and sore throat.   Eyes: Negative for blurred vision and redness.  Respiratory: Negative for cough and shortness of breath.   Cardiovascular: Negative for chest pain and leg swelling.  Gastrointestinal: Negative for abdominal pain, nausea and vomiting.  Genitourinary: Positive for frequency. Negative for dysuria and flank pain.  Musculoskeletal: Negative for back pain and myalgias.  Skin: Negative for itching and rash.  Neurological: Positive for speech change and weakness. Negative for dizziness.    Patient Active Problem List   Diagnosis Date Noted  . Altered mental status 12/13/2016  . Low TSH level 06/13/2014  . Systolic CHF ([place]) 123XX123  . Pain in joint, ankle and foot 04/01/2014  . Pain in lower limb 12/22/2013  . Metatarsalgia of both feet 12/22/2013  . Equinus deformity of foot, acquired 12/22/2013  . Onychomycosis 12/22/2013  . Diabetic neuropathy, painful ([place]) 12/22/2013  . Discomfort of chest wall 10/26/2013  . Health care maintenance 10/26/2013  . Sleeping difficulty 10/07/2012  . Left leg pain 10/29/2011  . GOUT, UNSPECIFIED 06/26/2010  . Diabetes ([place]) 06/29/2009  . GLAUCOMA 04/17/2009  . Elevated lipids 03/02/2009  . CAD 03/02/2009  . OBESITY 01/17/2009  . ALCOHOL ABUSE 01/09/2009  . Congestive dilated cardiomyopathy ([place]) 01/09/2009  . HYPERTENSION, BENIGN SYSTEMIC 12/18/2006  . SLE 12/18/2006    Past Medical History: Past Medical History:  Diagnosis Date  . CHF (congestive heart failure) ([place])   . Diabetes mellitus   . Hyperlipidemia   . Lupus     Past Surgical History: History reviewed. No pertinent surgical history.  Social History: Social History  Substance Use Topics   . Smoking status: Former Smoker    Quit date: 01/23/2001  . Smokeless tobacco: Former Systems developer  . Alcohol use 0.6 oz/week    1 Cans of beer per week   Additional social history: Lives by himself. Last drink was Wednesday -- 1 can of beer. Does not use illegal drugs. Former smoker. Please also refer to relevant sections of EMR.  Family History: Family History  Problem Relation Age of Onset  . Cirrhosis Brother   . Alcohol abuse Brother   . Stroke Maternal Grandmother   . Cancer Brother     throat cancer (smoker)    Allergies and Medications: No Known Allergies No current facility-administered medications on file prior to encounter.    Current Outpatient Prescriptions on File Prior to Encounter  Medication Sig Dispense Refill  . aspirin 81 MG tablet Take 81 mg by mouth daily. After a meal     . atorvastatin (LIPITOR) 80 MG tablet Take 1 tablet (80 mg total) by mouth at bedtime. 30 tablet 12  . carvedilol (COREG) 25 MG tablet Take 1 tablet (25 mg total) by mouth 2 (two) times daily. 180 tablet 0  . furosemide (LASIX) 40 MG tablet Take 1 tablet (40 mg total) by mouth daily. 15 tablet 0  . Insulin Glargine (LANTUS) 100 UNIT/ML Solostar Pen Inject 23  Units into the skin daily. Increase by 1 unit per day until fasting blood glucose less than 150 mg/dL.  Do not increase to more than 30 units daily 15 mL 11  . metFORMIN (GLUCOPHAGE) 1000 MG tablet Take 1 tablet (1,000 mg total) by mouth 2 (two) times daily with a meal. 60 tablet 6  . nitroGLYCERIN (NITROSTAT) 0.4 MG SL tablet Place 1 tablet (0.4 mg total) under the tongue every 5 (five) minutes as needed for chest pain. 20 tablet 3  . sacubitril-valsartan (ENTRESTO) 97-103 MG Take 1 tablet by mouth 2 (two) times daily.    . travoprost, benzalkonium, (TRAVATAN) 0.004 % ophthalmic solution Place 1 drop into both eyes at bedtime.     . gabapentin (NEURONTIN) 300 MG capsule Take 1-3 tablets qhs as needed for lower extremity pain. (Patient not  taking: Reported on 01/26/2016) 90 capsule 3  . isosorbide mononitrate (IMDUR) 30 MG 24 hr tablet Take 1 tablet (30 mg total) by mouth daily. (Patient not taking: Reported on 11/14/2016) 30 tablet 0  . [DISCONTINUED] sitaGLIPtin (JANUVIA) 100 MG tablet Take 1 tablet (100 mg total) by mouth daily. 30 tablet 3    Objective: BP 122/87 (BP Location: Right Arm)   Pulse 70   Temp 97.4 F (36.3 C) (Oral)   Resp 18   SpO2 100%  Exam: General: Alert male, resting in bed, in NAD Eyes: PERRLA, EOMI ENTM: Mild nasal congestion, MMM, no upper teeth Neck: Supple, FROM Cardiovascular: RRR, S1, S2, no m/r/g Respiratory: CTAB, no increased WOB Gastrointestinal: Soft, +BS, distended, no rebound or guarding MSK: Trace LE edema, 5-/5 strength of LEs but 5/5 plantar and dorsiflexion and 5/5 grip strength bilaterally Derm: Scratches across shins bilaterally, elongated and thickened toenails Neuro: AOx3 (though did take about 20 seconds to recall year), CNII-XII intact but patient reports new  Psych: Flat affect, normal mood  Labs and Imaging: CBC BMET   Recent Labs Lab 12/13/16 1251  WBC 8.8  HGB 13.9  HCT 40.0  PLT 355    Recent Labs Lab 12/13/16 1251  NA 138  K 4.1  CL 105  CO2 22  BUN 12  CREATININE 0.90  GLUCOSE 124*  CALCIUM 9.1     Ct Head Wo Contrast  Result Date: 12/13/2016 CLINICAL DATA:  Expressive aphasia.  Fall. EXAM: CT HEAD WITHOUT CONTRAST TECHNIQUE: Contiguous axial images were obtained from the base of the skull through the vertex without intravenous contrast. COMPARISON:  None. FINDINGS: Brain: Image quality degraded by motion Generalized mild atrophy. Multiple areas of chronic infarction involving the right frontal lobe and right parietal lobe. Chronic infarct in the left frontal lobe and in the left external capsule and periventricular white matter. Chronic infarct in the central pons. Chronic infarct in the left superior cerebellum. No acute infarct identified by CT.  Negative for hemorrhage or mass. No shift of the midline structures. Vascular: No hyperdense vessel or unexpected calcification. Skull: Negative Sinuses/Orbits: Negative Other: None IMPRESSION: Extensive chronic ischemic changes as above. No acute intracranial abnormality. Image quality degraded by motion. Electronically Signed   By: Franchot Gallo M.D.   On: 12/13/2016 13:16    Nimrat Woolworth Corinda Gubler, MD 12/13/2016, 6:46 PM PGY-2, [facility] pager: [phone], text pages welcome

## 2016-12-13 NOTE — ED Notes (Signed)
Patient transported to CT 

## 2016-12-13 NOTE — ED Provider Notes (Signed)
[facility] DEPT Provider Note   CSN: [id number] Arrival date & time: 12/13/16  1134  By signing my name below, I, Evelene Croon, attest that this documentation has been prepared under the direction and in the presence of Virgel Manifold, MD . Electronically Signed: Evelene Croon, Scribe. 12/13/2016. 12:37 PM.  History   Chief Complaint Chief Complaint  Patient presents with  . Altered Mental Status    The history is provided by the patient. No language interpreter was used.    HPI Comments:  Blake Wilson is a 66 y.o. male with a history of Lupus, DM, CHF and HLD, who presents to the Emergency Department via EMS sp/ fall yesterday. Pt states he slipped on wet ground. He denies LOC and injury but states he was too weak to get up. He was found by management of the facility where he stays. Pt states he feels fine; denies weakness at this time. Pt also denies SOB, urinary symptoms, fever, abdominal pain, vision changes, and nausea. He further notes speech change x a few weeks. He has been stuttering; states speech is normally fairly clear.  No h/o CVA. No alleviating factors noted.   Past Medical History:  Diagnosis Date  . CHF (congestive heart failure) ([place])   . Diabetes mellitus   . Hyperlipidemia   . Lupus     Patient Active Problem List   Diagnosis Date Noted  . Low TSH level 06/13/2014  . Systolic CHF ([place]) 03/00/9233  . Pain in joint, ankle and foot 04/01/2014  . Pain in lower limb 12/22/2013  . Metatarsalgia of both feet 12/22/2013  . Equinus deformity of foot, acquired 12/22/2013  . Onychomycosis 12/22/2013  . Diabetic neuropathy, painful ([place]) 12/22/2013  . Discomfort of chest wall 10/26/2013  . Health care maintenance 10/26/2013  . Sleeping difficulty 10/07/2012  . Left leg pain 10/29/2011  . GOUT, UNSPECIFIED 06/26/2010  . Diabetes ([place]) 06/29/2009  . GLAUCOMA 04/17/2009  . Elevated lipids 03/02/2009  . CAD 03/02/2009  . OBESITY 01/17/2009  . ALCOHOL  ABUSE 01/09/2009  . Congestive dilated cardiomyopathy ([place]) 01/09/2009  . HYPERTENSION, BENIGN SYSTEMIC 12/18/2006  . SLE 12/18/2006    History reviewed. No pertinent surgical history.     Home Medications    Prior to Admission medications   Medication Sig Start Date End Date Taking? Authorizing Provider  aspirin 81 MG tablet Take 81 mg by mouth daily. After a meal     Historical Provider, MD  atorvastatin (LIPITOR) 80 MG tablet Take 1 tablet (80 mg total) by mouth at bedtime. 10/29/11   Carolin Guernsey, MD  Blood Glucose Monitoring Suppl (ONE TOUCH ULTRA 2) w/Device KIT Use to check sugar four times a day 11/05/16   Smiley Houseman, MD  carvedilol (COREG) 25 MG tablet Take 1 tablet (25 mg total) by mouth 2 (two) times daily. 01/26/16   Smiley Houseman, MD  furosemide (LASIX) 40 MG tablet Take 1 tablet (40 mg total) by mouth daily. 01/26/16   Smiley Houseman, MD  gabapentin (NEURONTIN) 300 MG capsule Take 1-3 tablets qhs as needed for lower extremity pain. Patient not taking: Reported on 01/26/2016 03/05/13   Merlene L[place], MD  glucose blood test strip Use as instructed 11/05/16   Smiley Houseman, MD  Insulin Glargine (LANTUS) 100 UNIT/ML Solostar Pen Inject 23 Units into the skin daily. Increase by 1 unit per day until fasting blood glucose less than 150 mg/dL.  Do not increase to more than  30 units daily 11/28/16   Zenia Resides, MD  Insulin Pen Needle 31G X 8 MM MISC Use as directed. 11/08/16   Katheren Shams, DO  isosorbide mononitrate (IMDUR) 30 MG 24 hr tablet Take 1 tablet (30 mg total) by mouth daily. Patient not taking: Reported on 11/14/2016 01/26/16   Smiley Houseman, MD  Lancets [facility] ULTRASOFT) lancets Use as instructed 11/05/16   Smiley Houseman, MD  metFORMIN (GLUCOPHAGE) 1000 MG tablet Take 1 tablet (1,000 mg total) by mouth 2 (two) times daily with a meal. 12/02/16   Katheren Shams, DO  nitroGLYCERIN (NITROSTAT) 0.4 MG SL tablet Place 1 tablet (0.4 mg  total) under the tongue every 5 (five) minutes as needed for chest pain. Patient not taking: Reported on 11/28/2016 01/26/16   Smiley Houseman, MD  sacubitril-valsartan (ENTRESTO) 97-103 MG Take 1 tablet by mouth 2 (two) times daily.    Historical Provider, MD  travoprost, benzalkonium, (TRAVATAN) 0.004 % ophthalmic solution Place 1 drop into both eyes at bedtime.     Historical Provider, MD    Family History Family History  Problem Relation Age of Onset  . Cirrhosis Brother   . Alcohol abuse Brother   . Stroke Maternal Grandmother   . Cancer Brother     throat cancer (smoker)    Social History Social History  Substance Use Topics  . Smoking status: Former Smoker    Quit date: 01/23/2001  . Smokeless tobacco: Former Systems developer  . Alcohol use 0.6 oz/week    1 Cans of beer per week     Allergies   Patient has no known allergies.   Review of Systems Review of Systems  Constitutional: Negative for fever.  Eyes: Negative for visual disturbance.  Respiratory: Negative for shortness of breath.   Gastrointestinal: Negative for abdominal pain and nausea.  Genitourinary: Negative for dysuria and hematuria.  Neurological: Positive for speech difficulty and weakness. Negative for syncope and headaches.  All other systems reviewed and are negative.   Physical Exam Updated Vital Signs There were no vitals taken for this visit.  Physical Exam  Constitutional: He appears well-developed and well-nourished.  HENT:  Head: Normocephalic and atraumatic.  Eyes: EOM are normal.  Neck: Normal range of motion.  Cardiovascular: Normal rate, regular rhythm, normal heart sounds and intact distal pulses.   Pulmonary/Chest: Effort normal and breath sounds normal. No respiratory distress.  Abdominal: Soft. He exhibits no distension. There is no tenderness.  Musculoskeletal: Normal range of motion.  No external signs of trauma  Neurological: He is alert. No cranial nerve deficit.  Stuttering speech   Seems to understand questioning and answering appropriately, follows commands.  RLE bobbing when held off bed but does not actually hit bed.   Skin: Skin is warm and dry.  Psychiatric: He has a normal mood and affect. Judgment normal.  Nursing note and vitals reviewed.    ED Treatments / Results  DIAGNOSTIC STUDIES:  Oxygen Saturation is 99% on RA, normal by my interpretation.    COORDINATION OF CARE:  11:52 AM Discussed treatment plan with pt at bedside and pt agreed to plan.  Labs (all labs ordered are listed, but only abnormal results are displayed) Labs Reviewed  URINE CULTURE - Abnormal; Notable for the following:       Result Value   Culture   (*)    Value: >=100,000 COLONIES/mL STAPHYLOCOCCUS SPECIES (COAGULASE NEGATIVE)   Organism ID, Bacteria STAPHYLOCOCCUS SPECIES (COAGULASE NEGATIVE) (*)  All other components within normal limits  CK - Abnormal; Notable for the following:    Total CK 1,445 (*)    All other components within normal limits  BASIC METABOLIC PANEL - Abnormal; Notable for the following:    Glucose, Bld 124 (*)    All other components within normal limits  URINALYSIS, ROUTINE W REFLEX MICROSCOPIC - Abnormal; Notable for the following:    APPearance HAZY (*)    Hgb urine dipstick MODERATE (*)    Ketones, ur 5 (*)    Protein, ur 100 (*)    Leukocytes, UA SMALL (*)    Bacteria, UA RARE (*)    All other components within normal limits  CBC - Abnormal; Notable for the following:    RBC 4.18 (*)    Hemoglobin 12.5 (*)    HCT 37.4 (*)    All other components within normal limits  CK - Abnormal; Notable for the following:    Total CK 1,044 (*)    All other components within normal limits  COMPREHENSIVE METABOLIC PANEL - Abnormal; Notable for the following:    Potassium 3.3 (*)    Glucose, Bld 132 (*)    Total Protein 6.3 (*)    Albumin 3.1 (*)    All other components within normal limits  GLUCOSE, CAPILLARY - Abnormal; Notable for the following:     Glucose-Capillary 151 (*)    All other components within normal limits  GLUCOSE, CAPILLARY - Abnormal; Notable for the following:    Glucose-Capillary 165 (*)    All other components within normal limits  GLUCOSE, CAPILLARY - Abnormal; Notable for the following:    Glucose-Capillary 137 (*)    All other components within normal limits  GLUCOSE, CAPILLARY - Abnormal; Notable for the following:    Glucose-Capillary 110 (*)    All other components within normal limits  GLUCOSE, CAPILLARY - Abnormal; Notable for the following:    Glucose-Capillary 102 (*)    All other components within normal limits  BASIC METABOLIC PANEL - Abnormal; Notable for the following:    Glucose, Bld 161 (*)    All other components within normal limits  GLUCOSE, CAPILLARY - Abnormal; Notable for the following:    Glucose-Capillary 138 (*)    All other components within normal limits  BASIC METABOLIC PANEL - Abnormal; Notable for the following:    CO2 21 (*)    Glucose, Bld 101 (*)    BUN 5 (*)    Calcium 8.5 (*)    All other components within normal limits  GLUCOSE, CAPILLARY - Abnormal; Notable for the following:    Glucose-Capillary 111 (*)    All other components within normal limits  GLUCOSE, CAPILLARY - Abnormal; Notable for the following:    Glucose-Capillary 128 (*)    All other components within normal limits  GLUCOSE, CAPILLARY - Abnormal; Notable for the following:    Glucose-Capillary 100 (*)    All other components within normal limits  GLUCOSE, CAPILLARY - Abnormal; Notable for the following:    Glucose-Capillary 114 (*)    All other components within normal limits  GLUCOSE, CAPILLARY - Abnormal; Notable for the following:    Glucose-Capillary 108 (*)    All other components within normal limits  GLUCOSE, CAPILLARY - Abnormal; Notable for the following:    Glucose-Capillary 133 (*)    All other components within normal limits  GLUCOSE, CAPILLARY - Abnormal; Notable for the following:     Glucose-Capillary 118 (*)  All other components within normal limits  GLUCOSE, CAPILLARY - Abnormal; Notable for the following:    Glucose-Capillary 101 (*)    All other components within normal limits  GLUCOSE, CAPILLARY - Abnormal; Notable for the following:    Glucose-Capillary 128 (*)    All other components within normal limits  GLUCOSE, CAPILLARY - Abnormal; Notable for the following:    Glucose-Capillary 120 (*)    All other components within normal limits  GLUCOSE, CAPILLARY - Abnormal; Notable for the following:    Glucose-Capillary 110 (*)    All other components within normal limits  GLUCOSE, CAPILLARY - Abnormal; Notable for the following:    Glucose-Capillary 110 (*)    All other components within normal limits  GLUCOSE, CAPILLARY - Abnormal; Notable for the following:    Glucose-Capillary 169 (*)    All other components within normal limits  CULTURE, BLOOD (ROUTINE X 2)  CULTURE, BLOOD (ROUTINE X 2)  CBC WITH DIFFERENTIAL/PLATELET  RAPID URINE DRUG SCREEN, HOSP PERFORMED  ETHANOL  TSH  RPR  FOLATE  HIV ANTIBODY (ROUTINE TESTING)  VITAMIN B12  GLUCOSE, CAPILLARY  CBC  CK  CK  GLUCOSE, CAPILLARY    EKG  EKG Interpretation  Date/Time:  Friday December 13 2016 11:44:31 EST Ventricular Rate:  69 PR Interval:    QRS Duration: 104 QT Interval:  427 QTC Calculation: 458 R Axis:   -18 Text Interpretation:  Sinus rhythm Borderline left axis deviation Nonspecific T abnormalities, inferior leads Confirmed by Wilson Singer  MD, Yoko Mcgahee ([id number]) on 12/13/2016 2:42:59 PM       Radiology No results found.   Ct Abdomen Wo Contrast  Result Date: 12/22/2016 CLINICAL DATA:  Cerebral infarction and evaluation prior to possible percutaneous gastrostomy tube placement. EXAM: CT ABDOMEN WITHOUT CONTRAST TECHNIQUE: Multidetector CT imaging of the abdomen was performed following the standard protocol without IV contrast. COMPARISON:  None. FINDINGS: Lower chest: No acute  abnormality. Hepatobiliary: No focal liver abnormality is seen. No gallstones, gallbladder wall thickening, or biliary dilatation. Pancreas: Unremarkable. No pancreatic ductal dilatation or surrounding inflammatory changes. Spleen: Normal in size without focal abnormality. Adrenals/Urinary Tract: Adrenal glands are unremarkable. Kidneys are normal, without renal calculi, focal lesion, or hydronephrosis. Stomach/Bowel: No evidence of bowel obstruction, ileus or free air. Relationship of the stomach and colon appears normal without evidence of significant colonic interposition between the abdominal wall and the gastric lumen. No evidence of hiatal hernia. Vascular/Lymphatic: No significant vascular findings are present. No enlarged abdominal or pelvic lymph nodes. Other: No abdominal wall hernia or abnormality. No ascites or focal fluid collections identified. Musculoskeletal: No acute or significant osseous findings. IMPRESSION: Bowel anatomy is amenable to placement of a percutaneous gastrostomy tube. There are no significant findings in the abdomen by unenhanced CT. Electronically Signed   By: Aletta Edouard M.D.   On: 12/22/2016 15:23   Ct Angio Head W Or Wo Contrast  Result Date: 12/15/2016 CLINICAL DATA:  Follow-up nonhemorrhagic pontine infarct. History of hypertension, lupus, hyperlipidemia and diabetes. EXAM: CT ANGIOGRAPHY HEAD AND NECK TECHNIQUE: Multidetector CT imaging of the head and neck was performed using the standard protocol during bolus administration of intravenous contrast. Multiplanar CT image reconstructions and MIPs were obtained to evaluate the vascular anatomy. Carotid stenosis measurements (when applicable) are obtained utilizing NASCET criteria, using the distal internal carotid diameter as the denominator. CONTRAST:  50 cc Isovue 370 MRI of the head July 14, 2017 COMPARISON:  None. FINDINGS: CT HEAD FINDINGS BRAIN: No intraparenchymal hemorrhage, mass effect,  midline shift. Old  small bifrontal than small to moderate RIGHT parietal lobe infarcts. Old bilateral basal ganglia and LEFT cerebellar infarcts. Evolving nonhemorrhagic LEFT parasagittal pontine infarct. Moderate ventriculomegaly on the basis of global parenchymal brain volume loss. No abnormal extra-axial fluid collections. Basal cisterns are patent. VASCULAR: Moderate calcific atherosclerosis of the carotid siphons. SKULL: No skull fracture. Old minimally depressed LEFT nasal bone fracture. No significant scalp soft tissue swelling. SINUSES/ORBITS: Trace ethmoid mucosal thickening. Mastoid air cells are well aerated. Soft tissue effacing the RIGHT and partially opacifying the LEFT ostiomeatal units most compatible with cerumen. The included ocular globes and orbital contents are non-suspicious. OTHER: None. CTA NECK AORTIC ARCH: Normal appearance of the thoracic arch, mild calcific atherosclerosis. Two vessel arch is a normal variant. The origins of the innominate, left Common carotid artery and subclavian artery are widely patent. RIGHT CAROTID SYSTEM: Common carotid artery is widely patent, mild luminal irregularity compatible with atherosclerosis. Normal appearance of the carotid bifurcation without hemodynamically significant stenosis by NASCET criteria. Small, irregular RIGHT internal carotid artery without dissection or focal stenosis. LEFT CAROTID SYSTEM: Common carotid artery is widely patent, mild luminal irregularity compatible with atherosclerosis. Normal appearance of the carotid bifurcation without hemodynamically significant stenosis by NASCET criteria, mild eccentric calcific atherosclerosis. Mild luminal irregularity of the LEFT cervical internal carotid artery. VERTEBRAL ARTERIES:LEFT vertebral artery occluded within 1 centimeter of the origin due to eccentric intimal thickening. Thready reconstitution distal LEFT V2 segment, occluded at distal LEFT V3 segment. RIGHT vertebral artery is widely patent. Mild  extrinsic luminal narrowing due to degenerative cervical spine. SKELETON: No acute osseous process though bone windows have not been submitted. Poor dentition. Moderate degenerative change of the cervical spine. OTHER NECK: Soft tissues of the neck are non-acute though, not tailored for evaluation. CTA HEAD ANTERIOR CIRCULATION: Patent bilateral internal carotid arteries. Severe stenosis RIGHT para ophthalmic internal carotid artery presumably from atherosclerosis. Occluded RIGHT supraclinoid internal carotid artery. Thready reconstitution of RIGHT carotid terminus and proximal RIGHT M1 segment with attenuated RIGHT needle cerebral artery including severe tandem stenoses RIGHT M1 segment. Diminutive RIGHT A1 segment with widely patent LEFT A1 segment, and anterior communicating artery. Patent bilateral anterior cerebral arteries with moderate luminal irregularity. LEFT middle cerebral artery is patent with moderate luminal irregularity. No contrast extravasation or aneurysm. POSTERIOR CIRCULATION: Occluded LEFT intradural vertebral artery. Moderate luminal irregularity RIGHT V4 segment. Basilar artery is patent. Posterior communicating arteries not present. Moderate luminal irregularity bilateral posterior cerebral arteries. No contrast extravasation or aneurysm. VENOUS SINUSES: Major dural venous sinuses are patent though not tailored for evaluation on this angiographic examination. ANATOMIC VARIANTS: None. DELAYED PHASE: No abnormal intracranial enhancement. IMPRESSION: CT HEAD: Evolving nonhemorrhagic LEFT pontine infarct. Multiple old small vessel and large vascular territory infarcts. CTA NECK: Occluded LEFT vertebral artery, thready distal V2 reconstitution, occluded at distal LEFT V3 segment. Mild long segment narrowing RIGHT cervical internal carotid artery attributable to atherosclerosis or, fibromuscular dysplasia. CTA HEAD:  Occluded LEFT vertebral artery. Occluded RIGHT supraclinoid internal carotid  artery, reconstituted RIGHT carotid terminus with decreased RIGHT middle cerebral artery perfusion. Severely stenotic RIGHT M1 segment. Moderate intracranial atherosclerosis. Electronically Signed   By: Elon Alas M.D.   On: 12/15/2016 17:17   Ct Head Wo Contrast  Result Date: 12/13/2016 CLINICAL DATA:  Expressive aphasia.  Fall. EXAM: CT HEAD WITHOUT CONTRAST TECHNIQUE: Contiguous axial images were obtained from the base of the skull through the vertex without intravenous contrast. COMPARISON:  None. FINDINGS: Brain: Image quality degraded by motion Generalized  mild atrophy. Multiple areas of chronic infarction involving the right frontal lobe and right parietal lobe. Chronic infarct in the left frontal lobe and in the left external capsule and periventricular white matter. Chronic infarct in the central pons. Chronic infarct in the left superior cerebellum. No acute infarct identified by CT. Negative for hemorrhage or mass. No shift of the midline structures. Vascular: No hyperdense vessel or unexpected calcification. Skull: Negative Sinuses/Orbits: Negative Other: None IMPRESSION: Extensive chronic ischemic changes as above. No acute intracranial abnormality. Image quality degraded by motion. Electronically Signed   By: Franchot Gallo M.D.   On: 12/13/2016 13:16   Ct Angio Neck W Or Wo Contrast  Result Date: 12/15/2016 CLINICAL DATA:  Follow-up nonhemorrhagic pontine infarct. History of hypertension, lupus, hyperlipidemia and diabetes. EXAM: CT ANGIOGRAPHY HEAD AND NECK TECHNIQUE: Multidetector CT imaging of the head and neck was performed using the standard protocol during bolus administration of intravenous contrast. Multiplanar CT image reconstructions and MIPs were obtained to evaluate the vascular anatomy. Carotid stenosis measurements (when applicable) are obtained utilizing NASCET criteria, using the distal internal carotid diameter as the denominator. CONTRAST:  50 cc Isovue 370 MRI of the  head July 14, 2017 COMPARISON:  None. FINDINGS: CT HEAD FINDINGS BRAIN: No intraparenchymal hemorrhage, mass effect, midline shift. Old small bifrontal than small to moderate RIGHT parietal lobe infarcts. Old bilateral basal ganglia and LEFT cerebellar infarcts. Evolving nonhemorrhagic LEFT parasagittal pontine infarct. Moderate ventriculomegaly on the basis of global parenchymal brain volume loss. No abnormal extra-axial fluid collections. Basal cisterns are patent. VASCULAR: Moderate calcific atherosclerosis of the carotid siphons. SKULL: No skull fracture. Old minimally depressed LEFT nasal bone fracture. No significant scalp soft tissue swelling. SINUSES/ORBITS: Trace ethmoid mucosal thickening. Mastoid air cells are well aerated. Soft tissue effacing the RIGHT and partially opacifying the LEFT ostiomeatal units most compatible with cerumen. The included ocular globes and orbital contents are non-suspicious. OTHER: None. CTA NECK AORTIC ARCH: Normal appearance of the thoracic arch, mild calcific atherosclerosis. Two vessel arch is a normal variant. The origins of the innominate, left Common carotid artery and subclavian artery are widely patent. RIGHT CAROTID SYSTEM: Common carotid artery is widely patent, mild luminal irregularity compatible with atherosclerosis. Normal appearance of the carotid bifurcation without hemodynamically significant stenosis by NASCET criteria. Small, irregular RIGHT internal carotid artery without dissection or focal stenosis. LEFT CAROTID SYSTEM: Common carotid artery is widely patent, mild luminal irregularity compatible with atherosclerosis. Normal appearance of the carotid bifurcation without hemodynamically significant stenosis by NASCET criteria, mild eccentric calcific atherosclerosis. Mild luminal irregularity of the LEFT cervical internal carotid artery. VERTEBRAL ARTERIES:LEFT vertebral artery occluded within 1 centimeter of the origin due to eccentric intimal  thickening. Thready reconstitution distal LEFT V2 segment, occluded at distal LEFT V3 segment. RIGHT vertebral artery is widely patent. Mild extrinsic luminal narrowing due to degenerative cervical spine. SKELETON: No acute osseous process though bone windows have not been submitted. Poor dentition. Moderate degenerative change of the cervical spine. OTHER NECK: Soft tissues of the neck are non-acute though, not tailored for evaluation. CTA HEAD ANTERIOR CIRCULATION: Patent bilateral internal carotid arteries. Severe stenosis RIGHT para ophthalmic internal carotid artery presumably from atherosclerosis. Occluded RIGHT supraclinoid internal carotid artery. Thready reconstitution of RIGHT carotid terminus and proximal RIGHT M1 segment with attenuated RIGHT needle cerebral artery including severe tandem stenoses RIGHT M1 segment. Diminutive RIGHT A1 segment with widely patent LEFT A1 segment, and anterior communicating artery. Patent bilateral anterior cerebral arteries with moderate luminal irregularity. LEFT middle cerebral  artery is patent with moderate luminal irregularity. No contrast extravasation or aneurysm. POSTERIOR CIRCULATION: Occluded LEFT intradural vertebral artery. Moderate luminal irregularity RIGHT V4 segment. Basilar artery is patent. Posterior communicating arteries not present. Moderate luminal irregularity bilateral posterior cerebral arteries. No contrast extravasation or aneurysm. VENOUS SINUSES: Major dural venous sinuses are patent though not tailored for evaluation on this angiographic examination. ANATOMIC VARIANTS: None. DELAYED PHASE: No abnormal intracranial enhancement. IMPRESSION: CT HEAD: Evolving nonhemorrhagic LEFT pontine infarct. Multiple old small vessel and large vascular territory infarcts. CTA NECK: Occluded LEFT vertebral artery, thready distal V2 reconstitution, occluded at distal LEFT V3 segment. Mild long segment narrowing RIGHT cervical internal carotid artery attributable  to atherosclerosis or, fibromuscular dysplasia. CTA HEAD:  Occluded LEFT vertebral artery. Occluded RIGHT supraclinoid internal carotid artery, reconstituted RIGHT carotid terminus with decreased RIGHT middle cerebral artery perfusion. Severely stenotic RIGHT M1 segment. Moderate intracranial atherosclerosis. Electronically Signed   By: Elon Alas M.D.   On: 12/15/2016 17:17   Mr Brain Wo Contrast  Result Date: 12/21/2016 CLINICAL DATA:  New Aphasia. EXAM: MRI HEAD WITHOUT CONTRAST TECHNIQUE: Multiplanar, multiecho pulse sequences of the brain and surrounding structures were obtained without intravenous contrast. COMPARISON:  12/14/2016 FINDINGS: Brain: Acute infarct in the upper pons has progressed. The infarct involves nearly the entire AP span of the pons and the entire anterior transverse span. No superimposed hemorrhage. Extensive chronic ischemic injury with multiple remote infarcts in the upper left cerebellum. Remote bifrontal and right parietal cortical infarcts that are moderate volume. Multiple small chronic cortical infarcts in the right MCA distribution. Chronic lacune in the upper left subinsular white matter with hemosiderin staining. No hydrocephalus. Vascular: Persistent loss of flow void in the left vertebral artery. The basilar remains patent. Skull and upper cervical spine: No marrow lesion. Sinuses/Orbits: Negative Other: These results were called by telephone at the time of interpretation on 12/21/2016 at 7:13 pm to Dr. Lindell Noe , who verbally acknowledged these results. IMPRESSION: 1. Large acute bilateral pontine infarct that has progressed since 12/14/2016. No superimposed hemorrhage. 2. Known loss of left vertebral flow. Normal-appearing basilar flow void. 3. Extensive chronic ischemic injury as described. Electronically Signed   By: Monte Fantasia M.D.   On: 12/21/2016 19:13   Mr Jeri Cos ZO Contrast  Result Date: 12/14/2016 CLINICAL DATA:  Initial evaluation for acute speech  difficulty, altered mental status, fall. EXAM: MRI HEAD WITHOUT AND WITH CONTRAST TECHNIQUE: Multiplanar, multiecho pulse sequences of the brain and surrounding structures were obtained without and with intravenous contrast. CONTRAST:  39m MULTIHANCE GADOBENATE DIMEGLUMINE 529 MG/ML IV SOLN COMPARISON:  Prior CT from 12/13/2016. FINDINGS: Brain: Diffuse prominence of the CSF containing spaces is compatible with generalized age-related cerebral atrophy. Patchy and confluent T2/FLAIR hyperintensity within the periventricular and deep white matter both cerebral hemispheres most compatible chronic microvascular ischemic disease. Multiple scattered areas of encephalomalacia are present within the anterior frontal lobes bilaterally, compatible with remote ischemic infarcts. Additional small remote right parietal and occipital lobe infarcts present as well. Remote lacunar infarcts involve the bilateral basal ganglia. Small amount of chronic hemorrhage noted with the remote left basal ganglia infarct. Scattered remote left cerebellar infarcts present as well. There is abnormal restricted diffusion involving the central and left paramedian aspect of the pons, compatible with acute ischemic infarct (series 4, image 17). This is most predominant at the ventral aspect of the pons. No associated hemorrhage or significant mass effect. No other evidence for acute or subacute ischemia. Gray-white matter differentiation otherwise maintained. No  mass lesion, midline shift, or mass effect. No hydrocephalus. No extra-axial fluid collection. Major dural sinuses are grossly patent. No abnormal enhancement. Pituitary gland and suprasellar region within normal limits. Vascular: Abnormal flow void within the distal left vertebral artery, which may be related to slow flow and/ or occlusion (series 7, image 3). This is suspected to be chronic in nature given the remote left cerebellar infarcts. Normal flow voids seen within the remainder the  vertebrobasilar system. Intravascular flow voids preserved within the anterior circulation as well. Skull and upper cervical spine: Craniocervical junction within normal limits. Visualized upper cervical spine unremarkable. Bone marrow signal intensity within normal limits. No scalp soft tissue abnormality. Sinuses/Orbits: Globes and oval soft tissues within normal limits. Mild scattered mucosal thickening within the ethmoidal air cells and maxillary sinuses. Paranasal sinuses are otherwise clear. No mastoid effusion. Inner ear structures grossly normal. Other: No other significant finding. IMPRESSION: 1. Acute ischemic nonhemorrhagic pontine infarct as above. No associated mass effect. 2. Multiple additional remote/chronic ischemic infarcts as detailed above. 3. Generalized age-related cerebral atrophy with moderate chronic microvascular ischemic disease. 4. Abnormal flow void within the distal left vertebral artery, compatible with slow flow and/or occlusion. This is suspected to be chronic in nature given the presence of multiple remote left cerebellar infarcts. Remainder of the intravascular flow voids are maintained. Electronically Signed   By: Jeannine Boga M.D.   On: 12/14/2016 22:15   Ir Gastrostomy Tube Mod Sed  Result Date: 12/25/2016 INDICATION: Dysphagia. Please place percutaneous gastrostomy tube for enteric nutrition supplementation. EXAM: PULL TROUGH GASTROSTOMY TUBE PLACEMENT COMPARISON:  Abdominal CT -12/22/2016 MEDICATIONS: Ancef 2 gm IV; Antibiotics were administered within 1 hour of the procedure. Glucagon 1 mg IV CONTRAST:  10 mL of Isovue 300 administered into the gastric lumen. ANESTHESIA/SEDATION: Moderate (conscious) sedation was employed during this procedure. A total of Versed 1 mg and Fentanyl 50 mcg was administered intravenously. Moderate Sedation Time: 10 minutes. The patient's level of consciousness and vital signs were monitored continuously by radiology nursing throughout  the procedure under my direct supervision. FLUOROSCOPY TIME:  2 minutes 12 seconds (39 mGy) COMPLICATIONS: None immediate. PROCEDURE: Informed written consent was obtained from the patient following explanation of the procedure, risks, benefits and alternatives. A time out was performed prior to the initiation of the procedure. Ultrasound scanning was performed to demarcate the edge of the left lobe of the liver. Maximal barrier sterile technique utilized including caps, mask, sterile gowns, sterile gloves, large sterile drape, hand hygiene and Betadine prep. The left upper quadrant was sterilely prepped and draped. An oral gastric catheter was inserted into the stomach under fluoroscopy. The existing nasogastric feeding tube was removed. The left costal margin and air opacified transverse colon were identified and avoided. Air was injected into the stomach for insufflation and visualization under fluoroscopy. Under sterile conditions a 17 gauge trocar needle was utilized to access the stomach percutaneously beneath the left subcostal margin after the overlying soft tissues were anesthetized with 1% Lidocaine with epinephrine. Needle position was confirmed within the stomach with aspiration of air and injection of small amount of contrast. A single T tack was deployed for gastropexy. Over an Amplatz guide wire, a 9-French sheath was inserted into the stomach. A snare device was utilized to capture the oral gastric catheter. The snare device was pulled retrograde from the stomach up the esophagus and out the oropharynx. The 20-French pull-through gastrostomy was connected to the snare device and pulled antegrade through the oropharynx down the  esophagus into the stomach and then through the percutaneous tract external to the patient. The gastrostomy was assembled externally. Contrast injection confirms position in the stomach. Several spot radiographic images were obtained in various obliquities for documentation.  The patient tolerated procedure well without immediate post procedural complication. FINDINGS: After successful fluoroscopic guided placement, the gastrostomy tube is appropriately positioned with internal disc against the ventral aspect of the gastric lumen. IMPRESSION: Successful fluoroscopic insertion of a 20-French pull-through gastrostomy tube. The gastrostomy may be used immediately for medication administration and in 24 hrs for the initiation of feeds. Electronically Signed   By: Sandi Mariscal M.D.   On: 12/25/2016 14:21   Dg Abd Portable 1v  Result Date: 12/21/2016 CLINICAL DATA:  Enteric tube placement EXAM: PORTABLE ABDOMEN - 1 VIEW COMPARISON:  06/24/2007 CT abdomen/pelvis FINDINGS: No evidence of an enteric tube on this radiograph. No dilated small bowel loops. Mild retained oral contrast throughout the visualized large bowel. No evidence of pneumatosis or pneumoperitoneum. IMPRESSION: No evidence of an enteric tube on this radiograph. Nonobstructive bowel gas pattern. These results were called by telephone at the time of interpretation on 12/21/2016 at 8:19 pm to RN Octavia Bruckner, who verbally acknowledged these results. The RN stated that no enteric tube had been placed on this patient by the time of our conversation. Electronically Signed   By: Ilona Sorrel M.D.   On: 12/21/2016 20:22   Dg Swallowing Func-speech Pathology  Result Date: 12/16/2016 Objective Swallowing Evaluation: Type of Study: MBS-Modified Barium Swallow Study Patient Details Name: ZAYLAN KISSOON MRN: [id number] Date of Birth: [birth year] Today's Date: 12/16/2016 Time: SLP Start Time (ACUTE ONLY): 1030-SLP Stop Time (ACUTE ONLY): 1051 SLP Time Calculation (min) (ACUTE ONLY): 21 min Past Medical History: Past Medical History: Diagnosis Date . CHF (congestive heart failure) ([place])  . Diabetes mellitus  . Hyperlipidemia  . Lupus  Past Surgical History: No past surgical history on file. HPI: JABIR DAHLEM is a 66 y.o. male presenting  with weakness/AMS after fall. PMH is significant for CAD, HFrEF, Dilated Cardiomyopathy, HTN, T2DM with neuropathy, Equinus Deformity of Foot, Alcohol Abuse, SLE, Obesity, Glaucoma, and History of Low TSH. CT positive only for chronic areas of infarction including R frontal, R parietal, L frontal, L external capsule and periventricular white matter, L superior cerebellum and central pons. Found to have UTI. MRI 12/14/16 revealed acute ischemic nonhemorrhagic pontine infarct. Pt reportedly with new stutter, slowed speech. Bedside swallow eval completed 12/15/16--SLP observed explosive coughing and s/s of aspiration. Recommended NPO until instrumental swallow eval (MBS). Subjective: Alert, pleasant, wet vocal quality Assessment / Plan / Recommendation CHL IP CLINICAL IMPRESSIONS 12/16/2016 Clinical Impression Mr. Bruins exhibited a mild oral and pharyngeal dysphagia. Oral deficits including holding of bolus and delayed transit with thin liquids and barium pill with puree and mild prolonged mastication with regular solids. Pharyngeal phase impairments included penetration to the level of the vocal cords during trials of thin liquids via cup due to incomplete laryngeal closure. Penetration was not sensed by pt and verbal cues to cough/clear throat were ineffective in clearing penetrates. Educated pt re: compensatory strategy to utilize a chin tuck with cup sips of thin liquids. Chin tuck was effective in eliminating penetration during study and will likely reduce risk of penetration/aspiration in future. Observed pt with barium pill with puree; pt required extra bite of puree to aid in transit of barium pill through oral cavity and to clear pill that lodged mid esophagus. Recommend Dys 3 solids (due  to missing dentition), thin liquids (chin tuck), and meds whole with puree. Emphasized importance of utilizing chin tuck with thin liquids. ST will f/u for treatment to assess diet tolerance, safety/efficiency of swallow,  and compliance with compensatory strategy. SLP Visit Diagnosis Dysphagia, oral phase (R13.11);Dysphagia, pharyngeal phase (R13.13);Dysphagia, pharyngoesophageal phase (R13.14) Attention and concentration deficit following -- Frontal lobe and executive function deficit following -- Impact on safety and function Moderate aspiration risk   CHL IP TREATMENT RECOMMENDATION 12/16/2016 Treatment Recommendations Therapy as outlined in treatment plan below   Prognosis 12/16/2016 Prognosis for Safe Diet Advancement Good Barriers to Reach Goals Cognitive deficits Barriers/Prognosis Comment -- CHL IP DIET RECOMMENDATION 12/16/2016 SLP Diet Recommendations Dysphagia 3 (Mech soft) solids;Thin liquid Liquid Administration via Cup;No straw;Other (Comment) Medication Administration Whole meds with puree Compensations Slow rate;Small sips/bites;Chin tuck Postural Changes Seated upright at 90 degrees   CHL IP OTHER RECOMMENDATIONS 12/16/2016 Recommended Consults -- Oral Care Recommendations Oral care BID Other Recommendations --   CHL IP FOLLOW UP RECOMMENDATIONS 12/16/2016 Follow up Recommendations Skilled Nursing facility   [facility] IP FREQUENCY AND DURATION 12/16/2016 Speech Therapy Frequency (ACUTE ONLY) min 2x/week Treatment Duration 2 weeks      CHL IP ORAL PHASE 12/16/2016 Oral Phase Impaired Oral - Pudding Teaspoon -- Oral - Pudding Cup -- Oral - Honey Teaspoon -- Oral - Honey Cup -- Oral - Nectar Teaspoon -- Oral - Nectar Cup -- Oral - Nectar Straw -- Oral - Thin Teaspoon -- Oral - Thin Cup Delayed oral transit;Holding of bolus Oral - Thin Straw -- Oral - Puree -- Oral - Mech Soft -- Oral - Regular Other (Comment) Oral - Multi-Consistency -- Oral - Pill Holding of bolus;Delayed oral transit Oral Phase - Comment --  CHL IP PHARYNGEAL PHASE 12/16/2016 Pharyngeal Phase Impaired Pharyngeal- Pudding Teaspoon -- Pharyngeal -- Pharyngeal- Pudding Cup -- Pharyngeal -- Pharyngeal- Honey Teaspoon -- Pharyngeal -- Pharyngeal- Honey Cup -- Pharyngeal  -- Pharyngeal- Nectar Teaspoon -- Pharyngeal -- Pharyngeal- Nectar Cup -- Pharyngeal -- Pharyngeal- Nectar Straw -- Pharyngeal -- Pharyngeal- Thin Teaspoon -- Pharyngeal -- Pharyngeal- Thin Cup Penetration/Aspiration during swallow;Reduced airway/laryngeal closure Pharyngeal Material enters airway, remains ABOVE vocal cords then ejected out;Material enters airway, CONTACTS cords and not ejected out Pharyngeal- Thin Straw -- Pharyngeal -- Pharyngeal- Puree -- Pharyngeal -- Pharyngeal- Mechanical Soft -- Pharyngeal -- Pharyngeal- Regular WFL Pharyngeal -- Pharyngeal- Multi-consistency -- Pharyngeal -- Pharyngeal- Pill WFL Pharyngeal -- Pharyngeal Comment --  CHL IP CERVICAL ESOPHAGEAL PHASE 12/16/2016 Cervical Esophageal Phase WFL Pudding Teaspoon -- Pudding Cup -- Honey Teaspoon -- Honey Cup -- Nectar Teaspoon -- Nectar Cup -- Nectar Straw -- Thin Teaspoon -- Thin Cup -- Thin Straw -- Puree -- Mechanical Soft -- Regular -- Multi-consistency -- Pill -- Cervical Esophageal Comment -- No flowsheet data found. [place] Siren 12/16/2016, 12:37 PM [name] Colvin Caroli.Ed CCC-SLP Pager [phone]               Procedures Procedures (including critical care time)  Medications Ordered in ED Medications - No data to display   Initial Impression / Assessment and Plan / ED Course  I have reviewed the triage vital signs and the nursing notes.  Pertinent labs & imaging results that were available during my care of the patient were reviewed by me and considered in my medical decision making (see chart for details).       Final Clinical Impressions(s) / ED Diagnoses   Final diagnoses:  Altered mental state  CVA (cerebral vascular accident) ([place])  New Prescriptions New Prescriptions   No medications on file   I personally preformed the services scribed in my presence. The recorded information has been reviewed is accurate. Virgel Manifold, MD.     Virgel Manifold, MD 12/30/16 1420

## 2016-12-13 NOTE — Progress Notes (Signed)
Patient arrived from ED. Patient denies pain, A&O X4. Will continue to monitor

## 2016-12-14 ENCOUNTER — Inpatient Hospital Stay (HOSPITAL_COMMUNITY): Payer: PPO

## 2016-12-14 DIAGNOSIS — R748 Abnormal levels of other serum enzymes: Secondary | ICD-10-CM | POA: Diagnosis not present

## 2016-12-14 DIAGNOSIS — I5022 Chronic systolic (congestive) heart failure: Secondary | ICD-10-CM | POA: Diagnosis not present

## 2016-12-14 DIAGNOSIS — I6340 Cerebral infarction due to embolism of unspecified cerebral artery: Secondary | ICD-10-CM | POA: Diagnosis not present

## 2016-12-14 DIAGNOSIS — Z7982 Long term (current) use of aspirin: Secondary | ICD-10-CM | POA: Diagnosis not present

## 2016-12-14 DIAGNOSIS — I639 Cerebral infarction, unspecified: Secondary | ICD-10-CM | POA: Diagnosis not present

## 2016-12-14 DIAGNOSIS — N179 Acute kidney failure, unspecified: Secondary | ICD-10-CM | POA: Diagnosis not present

## 2016-12-14 DIAGNOSIS — R319 Hematuria, unspecified: Secondary | ICD-10-CM | POA: Diagnosis not present

## 2016-12-14 DIAGNOSIS — I6789 Other cerebrovascular disease: Secondary | ICD-10-CM | POA: Diagnosis not present

## 2016-12-14 DIAGNOSIS — I63212 Cerebral infarction due to unspecified occlusion or stenosis of left vertebral arteries: Secondary | ICD-10-CM | POA: Diagnosis not present

## 2016-12-14 DIAGNOSIS — I69391 Dysphagia following cerebral infarction: Secondary | ICD-10-CM | POA: Diagnosis not present

## 2016-12-14 DIAGNOSIS — R488 Other symbolic dysfunctions: Secondary | ICD-10-CM | POA: Diagnosis not present

## 2016-12-14 DIAGNOSIS — R29810 Facial weakness: Secondary | ICD-10-CM | POA: Diagnosis not present

## 2016-12-14 DIAGNOSIS — I340 Nonrheumatic mitral (valve) insufficiency: Secondary | ICD-10-CM | POA: Diagnosis not present

## 2016-12-14 DIAGNOSIS — E785 Hyperlipidemia, unspecified: Secondary | ICD-10-CM | POA: Diagnosis not present

## 2016-12-14 DIAGNOSIS — R531 Weakness: Secondary | ICD-10-CM | POA: Diagnosis not present

## 2016-12-14 DIAGNOSIS — I110 Hypertensive heart disease with heart failure: Secondary | ICD-10-CM | POA: Diagnosis not present

## 2016-12-14 DIAGNOSIS — H409 Unspecified glaucoma: Secondary | ICD-10-CM | POA: Diagnosis not present

## 2016-12-14 DIAGNOSIS — I63013 Cerebral infarction due to thrombosis of bilateral vertebral arteries: Secondary | ICD-10-CM | POA: Diagnosis not present

## 2016-12-14 DIAGNOSIS — R471 Dysarthria and anarthria: Secondary | ICD-10-CM | POA: Diagnosis not present

## 2016-12-14 DIAGNOSIS — B958 Unspecified staphylococcus as the cause of diseases classified elsewhere: Secondary | ICD-10-CM | POA: Diagnosis not present

## 2016-12-14 DIAGNOSIS — I679 Cerebrovascular disease, unspecified: Secondary | ICD-10-CM | POA: Diagnosis not present

## 2016-12-14 DIAGNOSIS — W19XXXA Unspecified fall, initial encounter: Secondary | ICD-10-CM | POA: Diagnosis not present

## 2016-12-14 DIAGNOSIS — N390 Urinary tract infection, site not specified: Secondary | ICD-10-CM | POA: Diagnosis not present

## 2016-12-14 DIAGNOSIS — Z794 Long term (current) use of insulin: Secondary | ICD-10-CM | POA: Diagnosis not present

## 2016-12-14 DIAGNOSIS — I6302 Cerebral infarction due to thrombosis of basilar artery: Secondary | ICD-10-CM | POA: Diagnosis not present

## 2016-12-14 DIAGNOSIS — R9431 Abnormal electrocardiogram [ECG] [EKG]: Secondary | ICD-10-CM | POA: Diagnosis not present

## 2016-12-14 DIAGNOSIS — M6281 Muscle weakness (generalized): Secondary | ICD-10-CM | POA: Diagnosis not present

## 2016-12-14 DIAGNOSIS — I69322 Dysarthria following cerebral infarction: Secondary | ICD-10-CM | POA: Diagnosis not present

## 2016-12-14 DIAGNOSIS — I69351 Hemiplegia and hemiparesis following cerebral infarction affecting right dominant side: Secondary | ICD-10-CM | POA: Diagnosis not present

## 2016-12-14 DIAGNOSIS — R4182 Altered mental status, unspecified: Secondary | ICD-10-CM | POA: Diagnosis not present

## 2016-12-14 DIAGNOSIS — I638 Other cerebral infarction: Secondary | ICD-10-CM | POA: Diagnosis not present

## 2016-12-14 DIAGNOSIS — I6322 Cerebral infarction due to unspecified occlusion or stenosis of basilar arteries: Secondary | ICD-10-CM | POA: Diagnosis not present

## 2016-12-14 DIAGNOSIS — R404 Transient alteration of awareness: Secondary | ICD-10-CM | POA: Diagnosis not present

## 2016-12-14 DIAGNOSIS — I63011 Cerebral infarction due to thrombosis of right vertebral artery: Secondary | ICD-10-CM | POA: Diagnosis not present

## 2016-12-14 DIAGNOSIS — I63019 Cerebral infarction due to thrombosis of unspecified vertebral artery: Secondary | ICD-10-CM | POA: Diagnosis not present

## 2016-12-14 DIAGNOSIS — S0990XA Unspecified injury of head, initial encounter: Secondary | ICD-10-CM | POA: Diagnosis not present

## 2016-12-14 DIAGNOSIS — I2510 Atherosclerotic heart disease of native coronary artery without angina pectoris: Secondary | ICD-10-CM | POA: Diagnosis not present

## 2016-12-14 DIAGNOSIS — E1140 Type 2 diabetes mellitus with diabetic neuropathy, unspecified: Secondary | ICD-10-CM | POA: Diagnosis not present

## 2016-12-14 DIAGNOSIS — R1312 Dysphagia, oropharyngeal phase: Secondary | ICD-10-CM | POA: Diagnosis not present

## 2016-12-14 DIAGNOSIS — Z7902 Long term (current) use of antithrombotics/antiplatelets: Secondary | ICD-10-CM | POA: Diagnosis not present

## 2016-12-14 DIAGNOSIS — I4891 Unspecified atrial fibrillation: Secondary | ICD-10-CM | POA: Diagnosis not present

## 2016-12-14 DIAGNOSIS — I69320 Aphasia following cerebral infarction: Secondary | ICD-10-CM | POA: Diagnosis not present

## 2016-12-14 DIAGNOSIS — G8191 Hemiplegia, unspecified affecting right dominant side: Secondary | ICD-10-CM | POA: Diagnosis not present

## 2016-12-14 DIAGNOSIS — E1151 Type 2 diabetes mellitus with diabetic peripheral angiopathy without gangrene: Secondary | ICD-10-CM | POA: Diagnosis not present

## 2016-12-14 DIAGNOSIS — M6282 Rhabdomyolysis: Secondary | ICD-10-CM | POA: Diagnosis not present

## 2016-12-14 DIAGNOSIS — R1310 Dysphagia, unspecified: Secondary | ICD-10-CM | POA: Diagnosis not present

## 2016-12-14 DIAGNOSIS — R4701 Aphasia: Secondary | ICD-10-CM | POA: Diagnosis not present

## 2016-12-14 DIAGNOSIS — M329 Systemic lupus erythematosus, unspecified: Secondary | ICD-10-CM | POA: Diagnosis not present

## 2016-12-14 DIAGNOSIS — I420 Dilated cardiomyopathy: Secondary | ICD-10-CM | POA: Diagnosis not present

## 2016-12-14 LAB — CBC
HCT: 37.4 % — ABNORMAL LOW (ref 39.0–52.0)
Hemoglobin: 12.5 g/dL — ABNORMAL LOW (ref 13.0–17.0)
MCH: 29.9 pg (ref 26.0–34.0)
MCHC: 33.4 g/dL (ref 30.0–36.0)
MCV: 89.5 fL (ref 78.0–100.0)
Platelets: 277 10*3/uL (ref 150–400)
RBC: 4.18 MIL/uL — ABNORMAL LOW (ref 4.22–5.81)
RDW: 13.7 % (ref 11.5–15.5)
WBC: 5.8 10*3/uL (ref 4.0–10.5)

## 2016-12-14 LAB — GLUCOSE, CAPILLARY
GLUCOSE-CAPILLARY: 110 mg/dL — AB (ref 65–99)
Glucose-Capillary: 165 mg/dL — ABNORMAL HIGH (ref 65–99)
Glucose-Capillary: 137 mg/dL — ABNORMAL HIGH (ref 65–99)
Glucose-Capillary: 93 mg/dL (ref 65–99)

## 2016-12-14 LAB — VITAMIN B12: VITAMIN B 12: 247 pg/mL (ref 180–914)

## 2016-12-14 LAB — COMPREHENSIVE METABOLIC PANEL
ALBUMIN: 3.1 g/dL — AB (ref 3.5–5.0)
ALT: 19 U/L (ref 17–63)
ANION GAP: 10 (ref 5–15)
AST: 27 U/L (ref 15–41)
Alkaline Phosphatase: 67 U/L (ref 38–126)
BUN: 12 mg/dL (ref 6–20)
CO2: 23 mmol/L (ref 22–32)
Calcium: 8.9 mg/dL (ref 8.9–10.3)
Chloride: 105 mmol/L (ref 101–111)
Creatinine, Ser: 0.86 mg/dL (ref 0.61–1.24)
GFR calc non Af Amer: >60 mL/min (ref >60)
GLUCOSE: 132 mg/dL — AB (ref 65–99)
POTASSIUM: 3.3 mmol/L — AB (ref 3.5–5.1)
SODIUM: 138 mmol/L (ref 135–145)
Total Bilirubin: 0.8 mg/dL (ref 0.3–1.2)
Total Protein: 6.3 g/dL — ABNORMAL LOW (ref 6.5–8.1)

## 2016-12-14 LAB — FOLATE: Folate: 17.7 ng/mL (ref >5.9)

## 2016-12-14 LAB — CK: Total CK: 1044 U/L — ABNORMAL HIGH (ref 49–397)

## 2016-12-14 LAB — TSH: TSH: 3.699 u[IU]/mL (ref 0.350–4.500)

## 2016-12-14 LAB — RAPID URINE DRUG SCREEN, HOSP PERFORMED
Amphetamines: NOT DETECTED
BENZODIAZEPINES: NOT DETECTED
Barbiturates: NOT DETECTED
COCAINE: NOT DETECTED
OPIATES: NOT DETECTED
Tetrahydrocannabinol: NOT DETECTED

## 2016-12-14 LAB — HIV ANTIBODY (ROUTINE TESTING W REFLEX): HIV SCREEN 4TH GENERATION: NONREACTIVE

## 2016-12-14 LAB — RPR: RPR Ser Ql: NONREACTIVE

## 2016-12-14 MED ORDER — POTASSIUM CHLORIDE CRYS ER 20 MEQ PO TBCR
40.0000 meq | EXTENDED_RELEASE_TABLET | Freq: Once | ORAL | Status: AC
  Administered 2016-12-14: 40 meq via ORAL
  Filled 2016-12-14: qty 2

## 2016-12-14 MED ORDER — GADOBENATE DIMEGLUMINE 529 MG/ML IV SOLN
19.0000 mL | Freq: Once | INTRAVENOUS | Status: AC | PRN
Start: 2016-12-14 — End: 2016-12-14
  Administered 2016-12-14: 19 mL via INTRAVENOUS

## 2016-12-14 MED ORDER — CEPHALEXIN 500 MG PO CAPS
500.0000 mg | ORAL_CAPSULE | Freq: Four times a day (QID) | ORAL | Status: DC
  Administered 2016-12-14 – 2016-12-15 (×4): 500 mg via ORAL
  Filled 2016-12-14 (×5): qty 1

## 2016-12-14 NOTE — Evaluation (Signed)
Physical Therapy Evaluation Patient Details Name: Blake Wilson MRN: [id number] DOB: [birth year] Today's Date: 12/14/2016   History of Present Illness  Blake Wilson is a 66 y.o. male presenting with weakness/AMS after fall. PMH is significant for CAD, HFrEF, Dilated Cardiomyopathy, HTN, T2DM with neuropathy, Equinus Deformity of Foot, Alcohol Abuse, SLE, Obesity, Glaucoma, and History of Low TSH.  CT positive only for chronic areas of infarction including R frontal, R parietal, L frontal, L external capsule and periventricular white matter, L superior cerebellum and central pons.  Found to have UTI.  Clinical Impression  Patient presents with decreased independence and safety with mobility due to decreased cognition, poor awareness of deficits, decreased cognition, decreased balance, as well as decreased coordination and generalized weakness.  He will benefit from skilled PT to address issues and decrease level of assist needed prior to d/c to SNF level rehab.     Follow Up Recommendations SNF    Equipment Recommendations  Rolling walker with 5" wheels    Recommendations for Other Services       Precautions / Restrictions Precautions Precautions: Fall      Mobility  Bed Mobility Overal bed mobility: Modified Independent                Transfers Overall transfer level: Needs assistance Equipment used: None;Rolling walker (2 wheeled) Transfers: Sit to/from Stand Sit to Stand: Mod assist         General transfer comment: initially difficulty coming upright without device due to imbalance in standing, then with walker, still needed lifting assist. pt sits with uncontrolled descent  Ambulation/Gait Ambulation/Gait assistance: Supervision;Min assist Ambulation Distance (Feet): 120 Feet (x 3) Assistive device: Rolling walker (2 wheeled) Gait Pattern/deviations: Step-through pattern;Decreased stride length;Trunk flexed;Shuffle;Wide base of support     General Gait  Details: some difficulty with turns with walker, unstable without walker, at times lifts walker up to straighten path, but needs min A for balance/safety when doing so  Stairs            Wheelchair Mobility    Modified Rankin (Stroke Patients Only) Modified Rankin (Stroke Patients Only) Pre-Morbid Rankin Score: Slight disability (unknown baseline, pt unreliable historian) Modified Rankin: Moderately severe disability     Balance Overall balance assessment: Needs assistance   Sitting balance-Leahy Scale: Fair   Postural control: Posterior lean Standing balance support: Bilateral upper extremity supported Standing balance-Leahy Scale: Poor Standing balance comment: initial balance without UE support needing mod A                              Pertinent Vitals/Pain Pain Assessment: No/denies pain    Home Living Family/patient expects to be discharged to:: Skilled nursing facility Living Arrangements: Alone   Type of Home: Apartment Home Access: Level entry     Home Layout: One level Home Equipment: None      Prior Function Level of Independence: Independent               Hand Dominance        Extremity/Trunk Assessment   Upper Extremity Assessment Upper Extremity Assessment: Defer to OT evaluation (decreased coordination with pron/sup and fingers to thumb)    Lower Extremity Assessment Lower Extremity Assessment: Generalized weakness (decreased coordination with toe tapping. )       Communication   Communication: Expressive difficulties  Cognition Arousal/Alertness: Awake/alert Behavior During Therapy: Flat affect Overall Cognitive Status: Impaired/Different from baseline Area of Impairment:  Orientation;Safety/judgement;Problem solving Orientation Level: Situation;Disoriented to;Time       Safety/Judgement: Decreased awareness of safety;Decreased awareness of deficits   Problem Solving: Slow processing;Decreased  initiation;Requires verbal cues;Requires tactile cues      General Comments      Exercises     Assessment/Plan    PT Assessment Patient needs continued PT services  PT Problem List Decreased strength;Decreased activity tolerance;Decreased balance;Decreased knowledge of use of DME;Decreased safety awareness;Decreased mobility;Decreased knowledge of precautions;Decreased cognition;Decreased coordination       PT Treatment Interventions DME instruction;Gait training;Therapeutic exercise;Patient/family education;Therapeutic activities;Cognitive remediation;Functional mobility training    PT Goals (Current goals can be found in the Care Plan section)  Acute Rehab PT Goals Patient Stated Goal: To get back to independent PT Goal Formulation: With patient Time For Goal Achievement: 12/21/16 Potential to Achieve Goals: Fair    Frequency Min 3X/week   Barriers to discharge Decreased caregiver support      Co-evaluation               End of Session Equipment Utilized During Treatment: Gait belt Activity Tolerance: Patient tolerated treatment well Patient left: with call bell/phone within reach;with bed alarm set;in bed        Functional Assessment Tool Used: AM-PAC 6 Clicks Basic Mobility Functional Limitation: Mobility: Walking and moving around Mobility: Walking and Moving Around Current Status [id number]): At least 40 percent but less than 60 percent impaired, limited or restricted Mobility: Walking and Moving Around Goal Status [phone]): At least 20 percent but less than 40 percent impaired, limited or restricted    Time: 1527-1556 PT Time Calculation (min) (ACUTE ONLY): 29 min   Charges:   PT Evaluation $PT Eval Moderate Complexity: 1 Procedure PT Treatments $Gait Training: 8-22 mins   PT G Codes:   PT G-Codes **NOT FOR INPATIENT CLASS** Functional Assessment Tool Used: AM-PAC 6 Clicks Basic Mobility Functional Limitation: Mobility: Walking and moving  around Mobility: Walking and Moving Around Current Status [id number]): At least 40 percent but less than 60 percent impaired, limited or restricted Mobility: Walking and Moving Around Goal Status [phone]): At least 20 percent but less than 40 percent impaired, limited or restricted     Reginia Naas 12/14/2016, 4:38 PM  Magda Kiel, [place] 12/14/2016

## 2016-12-14 NOTE — Evaluation (Signed)
Occupational Therapy Evaluation Patient Details Name: Blake Wilson MRN: [id number] DOB: 1951-01-27 Today's Date: 12/14/2016    History of Present Illness Blake Wilson is a 66 y.o. male presenting with weakness/AMS after fall. PMH is significant for CAD, HFrEF, Dilated Cardiomyopathy, HTN, T2DM with neuropathy, Equinus Deformity of Foot, Alcohol Abuse, SLE, Obesity, Glaucoma, and History of Low TSH.  CT positive only for chronic areas of infarction including R frontal, R parietal, L frontal, L external capsule and periventricular white matter, L superior cerebellum and central pons.  Found to have UTI.   Clinical Impression   PTA, pt reports independence with ADL and functional mobility and that he was living alone. Pt currently requires mod assist for toilet transfers and min guard assist for standing grooming tasks. He presents with significantly decreased safety awareness, problem solving skills, and ability to follow commands as well as generalized weakness impacting his ability to participate in ADL at [facility]. Pt would benefit from continued OT services while admitted to improve independence with ADL and functional mobility. Recommend short-term SNF placement for continued rehabilitation in order to maximize return to PLOF. OT will continue to follow acutely.     Follow Up Recommendations  SNF;Supervision/Assistance - 24 hour    Equipment Recommendations  Other (comment) (TBD)    Recommendations for Other Services       Precautions / Restrictions Precautions Precautions: Fall Restrictions Weight Bearing Restrictions: No      Mobility Bed Mobility Overal bed mobility: Modified Independent                Transfers Overall transfer level: Needs assistance Equipment used: None Transfers: Sit to/from Stand Sit to Stand: Mod assist         General transfer comment: Mod assist for initial power up to stand.    Balance Overall balance assessment: Needs  assistance Sitting-balance support: No upper extremity supported;Feet supported Sitting balance-Leahy Scale: Fair   Postural control: Posterior lean Standing balance support: No upper extremity supported;During functional activity Standing balance-Leahy Scale: Fair Standing balance comment: Able to statically stand for ADL without UE support.                            ADL Overall ADL's : Needs assistance/impaired Eating/Feeding: Supervision/ safety;Set up;Sitting;Cueing for safety;Cueing for sequencing   Grooming: Min guard;Standing;Cueing for safety;Cueing for sequencing   Upper Body Bathing: Supervision/ safety;Sitting;Cueing for safety;Cueing for sequencing   Lower Body Bathing: Min guard;Sit to/from stand;Cueing for safety;Cueing for sequencing Lower Body Bathing Details (indicate cue type and reason): Mod assist sit<>stand. Upper Body Dressing : Supervision/safety;Sitting;Cueing for safety;Cueing for sequencing   Lower Body Dressing: Min guard;Sit to/from stand;Cueing for safety;Cueing for sequencing Lower Body Dressing Details (indicate cue type and reason): Mod assist sit<>stand. Toilet Transfer: Ambulation;BSC;Cueing for safety;Cueing for sequencing;Moderate assistance Toilet Transfer Details (indicate cue type and reason): Mod assist sit<>stand. Toileting- Clothing Manipulation and Hygiene: Min guard;Cueing for sequencing;Cueing for safety;Sit to/from stand       Functional mobility during ADLs: Min guard;Cueing for safety;Cueing for sequencing General ADL Comments: Initially attempted use of RW for functional mobility but pt holding it off the ground despite cues and was able to complete mobility without AD. Pt requires verbal cues and tactile cues throughout tasks in order to safely complete. Additionally, he demonstrates difficulty following commands throughout session impacting ADL independence.     Vision Patient Visual Report: No change from  baseline Vision Assessment?: No apparent visual  deficits Additional Comments: Will continue to assess.     Perception     Praxis Praxis Praxis tested?: Deficits Deficits: Initiation;Organization    Pertinent Vitals/Pain Pain Assessment: No/denies pain     Hand Dominance Right   Extremity/Trunk Assessment Upper Extremity Assessment Upper Extremity Assessment: RUE deficits/detail;LUE deficits/detail;Generalized weakness RUE Deficits / Details: Generally weak with strength grossly 4/5.  RUE Coordination: decreased fine motor;decreased gross motor LUE Deficits / Details: Generally weak with strength grossly 4/5.  LUE Coordination: decreased fine motor;decreased gross motor   Lower Extremity Assessment Lower Extremity Assessment: Defer to PT evaluation       Communication Communication Communication: Expressive difficulties   Cognition Arousal/Alertness: Awake/alert Behavior During Therapy: Flat affect Overall Cognitive Status: Impaired/Different from baseline Area of Impairment: Orientation;Attention;Following commands;Safety/judgement;Problem solving;Awareness Orientation Level: Disoriented to;Situation;Time Current Attention Level: Selective   Following Commands: Follows multi-step commands inconsistently;Follows one step commands with increased time Safety/Judgement: Decreased awareness of safety;Decreased awareness of deficits Awareness: Intellectual Problem Solving: Slow processing;Decreased initiation;Requires verbal cues;Requires tactile cues     General Comments       Exercises       Shoulder Instructions      Home Living Family/patient expects to be discharged to:: Skilled nursing facility Living Arrangements: Alone   Type of Home: Apartment Home Access: Level entry     Home Layout: One level               Home Equipment: None          Prior Functioning/Environment Level of Independence: Independent                 OT Problem  List: Decreased strength;Decreased activity tolerance;Impaired balance (sitting and/or standing);Decreased coordination;Decreased cognition;Decreased safety awareness;Decreased knowledge of use of DME or AE;Decreased knowledge of precautions;Impaired UE functional use      OT Treatment/Interventions: Self-care/ADL training;Neuromuscular education;DME and/or AE instruction;Therapeutic activities;Cognitive remediation/compensation;Visual/perceptual remediation/compensation;Patient/family education;Balance training;Energy conservation;Therapeutic exercise    OT Goals(Current goals can be found in the care plan section) Acute Rehab OT Goals Patient Stated Goal: To get back to independent OT Goal Formulation: With patient Time For Goal Achievement: 12/28/16 Potential to Achieve Goals: Good ADL Goals Pt Will Perform Grooming: with modified independence;standing Pt Will Transfer to Toilet: with modified independence;ambulating;bedside commode (BSC over toilet) Pt Will Perform Toileting - Clothing Manipulation and hygiene: with modified independence;sit to/from stand Additional ADL Goal #1: Pt will demonstrate anticipatory awareness during multi-step ADL tasks in minimally distracting environment with no verbal cues.  OT Frequency: Min 2X/week   Barriers to D/C:            Co-evaluation              End of Session Equipment Utilized During Treatment: Gait belt;Rolling walker Nurse Communication: Mobility status  Activity Tolerance: Patient tolerated treatment well Patient left: in chair;with call bell/phone within reach;with chair alarm set;with nursing/sitter in room  OT Visit Diagnosis: Muscle weakness (generalized) (M62.81);Unsteadiness on feet (R26.81)                ADL either performed or assessed with clinical judgement  Time: 1301-1330 OT Time Calculation (min): 29 min Charges:  OT General Charges $OT Visit: 1 Procedure OT Evaluation $OT Eval Moderate Complexity: 1  Procedure OT Treatments $Self Care/Home Management : 8-22 mins G-Codes: OT G-codes **NOT FOR INPATIENT CLASS** Functional Assessment Tool Used: AM-PAC 6 Clicks Daily Activity Functional Limitation: Self care Self Care Current Status [id number]): At least 40 percent but less than 60 percent  impaired, limited or restricted Self Care Goal Status [phone]): 0 percent impaired, limited or restricted   Norman Herrlich, MS OTR/L  Pager: [place] 12/14/2016, 5:00 PM

## 2016-12-14 NOTE — Progress Notes (Signed)
Family Medicine Teaching Service Daily Progress Note Intern Pager: [phone]  Patient name: Blake Wilson Medical record number: [id number] Date of birth: [birth year] Age: 66 y.o. Gender: male  Primary Care Provider: Luiz Blare, DO Consultants: None Code Status: Full  Pt Overview and Major Events to Date:  2/23- Admitted for AMS and weakness  Assessment and Plan: Blake Wilson is a 66 y.o. male presenting with weakness/AMS after fall. PMH is significant for CAD, HFrEF, Dilated Cardiomyopathy, HTN, T2DM with neuropathy, Equinus Deformity of Foot, Alcohol Abuse, SLE, Obesity, Glaucoma, and History of Low TSH.  UTI: Too numerous to count WBC on UA with leukocytes but rare bacteria and negative nitrites. No CVA tenderness, leukocytosis or fever to suggest more systemic infection. Afebrile - Transition to keflex 2/24 s/p ceftriaxone - Obtain gram stain - Follow-up urine and blood cultures  Possible AMS: AOx3 on admission but slowed speech. Appears to know his medications from recent office visits but incorrectly named his lantus dose (10 U vs 30 u recently recommended). Cannot elaborate on events that led to hospitalization and how he has been feeling lately. Attempted to reach Emergency contact and cousin to better establish baseline but no answers. Concern for dementia given chronic ischemic changes on CT.  - Treat UTI and assess for improvement s/p one dose antibitoic - will do further imaging with MRI -may need to consider neurology consult - Blood cultures x 2 ordered - UDS pending - pending HIV, RPR; TSH, B12 and folate negative  Stutter: New per patient. No acute changes on head CT but chronic ischemic changes noted within multiple areas. No aphasia but speaking slowly in short answers. Consider central cause vs. emotional, e.g., new stressors.  - Will collect MRI of brain - patient has no close relative or friend he wants me to call; does not think anyone would no  baseline  Weakness: Sensation intact in feet bilaterally. History of diabetic neuropathy but denies taking his prescribed gabapentin. - Thiamine per CIWA protocol - Orthostatic vital signs negative - PT/OT consult  Elevated CK/Possible Rhabdomyolysis: Suspect 2/2 prolonged immobilization after fall. No complaint of myalgias and no AKI. - s/p 1 L IVFs in ED - Had light IVFs overnight given reduced EF (75 mL/hr x 12 hours) - continue PO hydration when completed - Ck trending down - Hold lasix  Dilated Cardiomyopathy/CHF: Last EF 25-30% in 2015. Patient part of a research trial and is supposed to be on entresto, coreg, imdur but only reports being on entresto. Also reports taking lasix 40 mg daily.  - Continue entresto BID - Would restart imdur if becomes hypertensive - Holding lasix given likely rhabdomyolysis  Diabetes: Last A1c 10.0 on 11/05/16. Reports taking 10 U lantus, though was recommended to increase to 30 U at last Office Visit. Takes metformin 1000 mg BID.  - Ordered lantus 5 U nightly, half of reported dose - Sensitive SSI - Hold metformin in case of imaging  History of alcohol abuse: Reports only occasional drinking. Last drink 2/21. Ethanol level neg. - CIWA protocol - scores of 0  Hx CAD:  - Continue home aspirin - Hold lipitor in setting of likely rhabdomyolysis  FEN/GI: carb modified/heart healthy diet Prophylaxis: Lovenox  Disposition: Pending improvement in mental status and PT recs  Subjective:  Patient without complaints this morning. Denies pain. Continues to endorse stuttering and altered speech that is new over the last 2 weeks.   Objective: Temp:  [97.4 F (36.3 C)-98.3 F (36.8 C)] 97.7 F (36.5  C) (02/24 0512) Pulse Rate:  [58-80] 78 (02/24 0512) Resp:  [14-19] 18 (02/24 0512) BP: (122-170)/(45-87) 155/59 (02/24 0512) SpO2:  [97 %-100 %] 100 % (02/24 0512) Physical Exam: General: Alert male, resting in bed, in NAD Cardiovascular: RRR,  S1, S2, no m/r/g Respiratory: CTAB, no increased WOB Gastrointestinal: Soft, +BS, distended, no rebound or guarding MSK: Trace LE edema, 4/5 strength Derm: Scratches across shins bilaterally, elongated and thickened toenails Neuro: AOx3, no focal deficits Psych: Flat affect, normal mood. Odd  Laboratory:  Recent Labs Lab 12/13/16 1251 12/14/16 0254  WBC 8.8 5.8  HGB 13.9 12.5*  HCT 40.0 37.4*  PLT 355 277    Recent Labs Lab 12/13/16 1251 12/14/16 0254  NA 138 138  K 4.1 3.3*  CL 105 105  CO2 22 23  BUN 12 12  CREATININE 0.90 0.86  CALCIUM 9.1 8.9  PROT  --  6.3*  BILITOT  --  0.8  ALKPHOS  --  67  ALT  --  19  AST  --  27  GLUCOSE 124* 132*   CK 1445 TSH 3.7  UCx/BCx - pending  Imaging/Diagnostic Tests: Ct Head Wo Contrast  Result Date: 12/13/2016 IMPRESSION: Extensive chronic ischemic changes as above. No acute intracranial abnormality. Image quality degraded by motion. Electronically Signed   By: Franchot Gallo M.D.   On: 12/13/2016 13:16   Katheren Shams, DO 12/14/2016, 6:57 AM PGY-3, [place] Intern pager: [phone], text pages welcome

## 2016-12-15 ENCOUNTER — Inpatient Hospital Stay (HOSPITAL_COMMUNITY): Payer: PPO

## 2016-12-15 ENCOUNTER — Encounter (HOSPITAL_COMMUNITY): Payer: Self-pay | Admitting: Radiology

## 2016-12-15 DIAGNOSIS — G8191 Hemiplegia, unspecified affecting right dominant side: Secondary | ICD-10-CM

## 2016-12-15 DIAGNOSIS — R471 Dysarthria and anarthria: Secondary | ICD-10-CM

## 2016-12-15 DIAGNOSIS — I69319 Unspecified symptoms and signs involving cognitive functions following cerebral infarction: Secondary | ICD-10-CM

## 2016-12-15 DIAGNOSIS — R29810 Facial weakness: Secondary | ICD-10-CM

## 2016-12-15 DIAGNOSIS — I638 Other cerebral infarction: Secondary | ICD-10-CM

## 2016-12-15 DIAGNOSIS — I63212 Cerebral infarction due to unspecified occlusion or stenosis of left vertebral arteries: Secondary | ICD-10-CM

## 2016-12-15 DIAGNOSIS — I6322 Cerebral infarction due to unspecified occlusion or stenosis of basilar arteries: Secondary | ICD-10-CM

## 2016-12-15 LAB — GLUCOSE, CAPILLARY
GLUCOSE-CAPILLARY: 138 mg/dL — AB (ref 65–99)
GLUCOSE-CAPILLARY: 128 mg/dL — AB (ref 65–99)
Glucose-Capillary: 102 mg/dL — ABNORMAL HIGH (ref 65–99)
Glucose-Capillary: 111 mg/dL — ABNORMAL HIGH (ref 65–99)

## 2016-12-15 LAB — BASIC METABOLIC PANEL
ANION GAP: 7 (ref 5–15)
BUN: 7 mg/dL (ref 6–20)
CO2: 24 mmol/L (ref 22–32)
Calcium: 8.9 mg/dL (ref 8.9–10.3)
Chloride: 105 mmol/L (ref 101–111)
Creatinine, Ser: 0.79 mg/dL (ref 0.61–1.24)
Glucose, Bld: 161 mg/dL — ABNORMAL HIGH (ref 65–99)
POTASSIUM: 4.2 mmol/L (ref 3.5–5.1)
SODIUM: 136 mmol/L (ref 135–145)

## 2016-12-15 LAB — URINE CULTURE: Culture: >=100000 — AB

## 2016-12-15 LAB — CBC
HEMATOCRIT: 39.5 % (ref 39.0–52.0)
HEMOGLOBIN: 13.5 g/dL (ref 13.0–17.0)
MCH: 30.5 pg (ref 26.0–34.0)
MCHC: 34.2 g/dL (ref 30.0–36.0)
MCV: 89.4 fL (ref 78.0–100.0)
Platelets: 269 10*3/uL (ref 150–400)
RBC: 4.42 MIL/uL (ref 4.22–5.81)
RDW: 13.5 % (ref 11.5–15.5)
WBC: 4.3 10*3/uL (ref 4.0–10.5)

## 2016-12-15 LAB — CK: CK TOTAL: 369 U/L (ref 49–397)

## 2016-12-15 MED ORDER — CLOPIDOGREL BISULFATE 75 MG PO TABS
75.0000 mg | ORAL_TABLET | Freq: Every day | ORAL | Status: DC
  Administered 2016-12-16 – 2016-12-18 (×3): 75 mg via ORAL
  Filled 2016-12-15 (×3): qty 1

## 2016-12-15 MED ORDER — ATORVASTATIN CALCIUM 80 MG PO TABS
80.0000 mg | ORAL_TABLET | Freq: Every day | ORAL | Status: DC
  Administered 2016-12-16 – 2016-12-17 (×2): 80 mg via ORAL
  Filled 2016-12-15 (×3): qty 1

## 2016-12-15 MED ORDER — CARVEDILOL 6.25 MG PO TABS
6.2500 mg | ORAL_TABLET | Freq: Two times a day (BID) | ORAL | Status: DC
  Administered 2016-12-16 – 2016-12-18 (×4): 6.25 mg via ORAL
  Filled 2016-12-15 (×6): qty 1

## 2016-12-15 MED ORDER — ASPIRIN EC 325 MG PO TBEC
325.0000 mg | DELAYED_RELEASE_TABLET | Freq: Every day | ORAL | Status: DC
  Administered 2016-12-16 – 2016-12-18 (×3): 325 mg via ORAL
  Filled 2016-12-15 (×3): qty 1

## 2016-12-15 MED ORDER — FUROSEMIDE 40 MG PO TABS
40.0000 mg | ORAL_TABLET | Freq: Every day | ORAL | Status: DC
  Administered 2016-12-16: 40 mg via ORAL
  Filled 2016-12-15: qty 1

## 2016-12-15 MED ORDER — IOPAMIDOL (ISOVUE-370) INJECTION 76%
INTRAVENOUS | Status: AC
  Administered 2016-12-15: 50 mL
  Filled 2016-12-15: qty 50

## 2016-12-15 MED ORDER — CIPROFLOXACIN HCL 250 MG PO TABS
250.0000 mg | ORAL_TABLET | Freq: Two times a day (BID) | ORAL | Status: DC
  Administered 2016-12-15 – 2016-12-18 (×6): 250 mg via ORAL
  Filled 2016-12-15 (×7): qty 1

## 2016-12-15 MED ORDER — STROKE: EARLY STAGES OF RECOVERY BOOK
Freq: Once | Status: AC
  Administered 2016-12-15: 14:00:00

## 2016-12-15 NOTE — Evaluation (Signed)
Speech Language Pathology Evaluation Patient Details Name: Blake Wilson MRN: [id number] DOB: 1951/01/12 Today's Date: 12/15/2016 Time: [id number] SLP Time Calculation (min) (ACUTE ONLY): 17 min  Problem List:  Patient Active Problem List   Diagnosis Date Noted  . Altered mental status 12/13/2016  . Urinary tract infection with hematuria   . Elevated CK   . Weakness   . Low TSH level 06/13/2014  . Systolic CHF ([place]) [id number]  . Pain in joint, ankle and foot 04/01/2014  . Pain in lower limb 12/22/2013  . Metatarsalgia of both feet 12/22/2013  . Equinus deformity of foot, acquired 12/22/2013  . Onychomycosis 12/22/2013  . Diabetic neuropathy, painful ([place]) 12/22/2013  . Discomfort of chest wall 10/26/2013  . Health care maintenance 10/26/2013  . Sleeping difficulty 10/07/2012  . Left leg pain 10/29/2011  . GOUT, UNSPECIFIED 06/26/2010  . Diabetes ([place]) 06/29/2009  . GLAUCOMA 04/17/2009  . Elevated lipids 03/02/2009  . CAD 03/02/2009  . OBESITY 01/17/2009  . ALCOHOL ABUSE 01/09/2009  . Congestive dilated cardiomyopathy ([place]) 01/09/2009  . HYPERTENSION, BENIGN SYSTEMIC 12/18/2006  . SLE 12/18/2006   Past Medical History:  Past Medical History:  Diagnosis Date  . CHF (congestive heart failure) ([place])   . Diabetes mellitus   . Hyperlipidemia   . Lupus    Past Surgical History: History reviewed. No pertinent surgical history. HPI:  Blake Wilson is a 66 y.o. male presenting with weakness/AMS after fall. PMH is significant for CAD, HFrEF, Dilated Cardiomyopathy, HTN, T2DM with neuropathy, Equinus Deformity of Foot, Alcohol Abuse, SLE, Obesity, Glaucoma, and History of Low TSH. CT positive only for chronic areas of infarction including R frontal, R parietal, L frontal, L external capsule and periventricular white matter, L superior cerebellum and central pons. Found to have UTI. MRI 12/14/16 revealed acute ischemic nonhemorrhagic pontine infarct. Pt reportedly with new  stutter, slowed speech. Bedside swallow and cog-linguistic eval ordered. Pt currently NPO, no stroke swallow screen in chart.   Assessment / Plan / Recommendation Clinical Impression  Patient presents with cognitive communication impairment (deficits in memory, initiation, executive function, safety/judgment) and moderately reduced intelligibility due to low vocal intensity, wet vocal quality, and mildly impaired articulation. Patient also is noted with stuttering, which he reports is new onset since his stroke. Frequent initial consonant sound repetitions (5-6 times on average). Given chronic right frontal and right parietal infarcts, suspect cognitive communication deficits may at least in part be baseline deficits. Patient does report he has been having more difficulty with memory in the last week (remembering where he places things). Recommend skilled ST to address communication deficits in order to improve intelligibility, functional communication and safety. Given cognitive deficits, recommend short-term SNF placement upon discharge. SLP will follow acutely.     SLP Assessment  SLP Recommendation/Assessment: Patient needs continued Speech Lanaguage Pathology Services SLP Visit Diagnosis: Cognitive communication deficit (R41.841);Dysarthria and anarthria (R47.1)    Follow Up Recommendations  Skilled Nursing facility    Frequency and Duration min 2x/week  2 weeks      SLP Evaluation Cognition  Overall Cognitive Status: Impaired/Different from baseline Arousal/Alertness: Awake/alert Orientation Level: Oriented X4 Attention: Focused Focused Attention: Appears intact Memory: Impaired Memory Impairment: Decreased recall of new information Awareness: Appears intact Problem Solving: Impaired Problem Solving Impairment: Verbal basic;Functional basic Executive Function: Initiating;Reasoning Reasoning: Impaired Reasoning Impairment: Verbal basic;Functional basic Initiating:  Impaired Initiating Impairment: Verbal basic;Functional basic Safety/Judgment: Impaired Comments: affect appears flat       Comprehension  Auditory Comprehension Overall  Auditory Comprehension: Appears within functional limits for tasks assessed Visual Recognition/Discrimination Discrimination: Within Function Limits Reading Comprehension Reading Status: Not tested    Expression Expression Primary Mode of Expression: Verbal Verbal Expression Overall Verbal Expression: Appears within functional limits for tasks assessed Repetition: No impairment Naming: No impairment Written Expression Dominant Hand: Right Written Expression: Exceptions to [facility] Self Formulation Ability: Phrase   Oral / Motor  Oral Motor/Sensory Function Overall Oral Motor/Sensory Function: Mild impairment Facial ROM: Within Functional Limits Facial Symmetry: Within Functional Limits Facial Strength: Within Functional Limits Facial Sensation: Within Functional Limits Lingual ROM: Within Functional Limits Lingual Symmetry: Within Functional Limits Lingual Strength: Reduced Lingual Sensation: Within Functional Limits Velum: Within Functional Limits Mandible: Impaired Motor Speech Overall Motor Speech: Appears within functional limits for tasks assessed (moderate stuttering noted; initial consonant sound repetitio) Respiration: Within functional limits Phonation: Low vocal intensity Articulation: Impaired Level of Impairment: Phrase Intelligibility: Intelligibility reduced Word: 75-100% accurate Phrase: 75-100% accurate Sentence: 50-74% accurate (Impaired by wet vocal quality) Motor Speech Errors: Aware Effective Techniques: Increased vocal intensity (Clear throat, swallow)   GO             Deneise Lever, MS CF-SLP Speech-Language Pathologist [phone]        Aliene Altes 12/15/2016, 2:35 PM

## 2016-12-15 NOTE — Evaluation (Signed)
Clinical/Bedside Swallow Evaluation Patient Details  Name: Blake Wilson MRN: [id number] Date of Birth: Feb 14, 1951  Today's Date: 12/15/2016 Time: SLP Start Time (ACUTE ONLY): 1300 SLP Stop Time (ACUTE ONLY): 1312 SLP Time Calculation (min) (ACUTE ONLY): 12 min  Past Medical History:  Past Medical History:  Diagnosis Date  . CHF (congestive heart failure) ([place])   . Diabetes mellitus   . Hyperlipidemia   . Lupus    Past Surgical History: History reviewed. No pertinent surgical history. HPI:  Blake Wilson is a 66 y.o. male presenting with weakness/AMS after fall. PMH is significant for CAD, HFrEF, Dilated Cardiomyopathy, HTN, T2DM with neuropathy, Equinus Deformity of Foot, Alcohol Abuse, SLE, Obesity, Glaucoma, and History of Low TSH. CT positive only for chronic areas of infarction including R frontal, R parietal, L frontal, L external capsule and periventricular white matter, L superior cerebellum and central pons. Found to have UTI. MRI 12/14/16 revealed acute ischemic nonhemorrhagic pontine infarct. Pt reportedly with new stutter, slowed speech. Bedside swallow and cog-linguistic eval ordered. Pt currently NPO, no stroke swallow screen in chart.   Assessment / Plan / Recommendation Clinical Impression  Patient presents with explosive coughing episode following multiple cup sips of approximately 1 oz thin liquids, suggestive of reduced airway protection. He states, "it went down the wrong pipe." No overt signs of aspiration noted with pureed solids. Prolonged mastication noted with regular solids, and moderate residue remained posterior lingual surface. Trialed nectar thick liquids, and while no coughing observed, patient presented immediately with wet vocal quality. Cued for throat clear and reswallow, which improved vocal quality. Patient with intermittent wet vocal quality throughout the remainder of the session and subsequent cognitive-linguistic evaluation, suggestive of  aspiration of secretions. He required verbal cues to clear throat and reswallow. Given these overt signs of aspiration, recommend pt remain NPO pending instrumental assessment to rule out aspiration, aid in determining safest, least restrictive diet for adequate nutrition/hydration and to identify compensatory techniques which may improve swallowing function. Essential medications can be given crushed in puree. MBS has been ordered and will be performed tomorrow, 2/26.  SLP Visit Diagnosis: Dysphagia, unspecified (R13.10)    Aspiration Risk  Severe aspiration risk    Diet Recommendation NPO   Medication Administration: Via alternative means    Other  Recommendations Oral Care Recommendations: Oral care QID   Follow up Recommendations Skilled Nursing facility      Frequency and Duration min 2x/week  2 weeks       Prognosis Prognosis for Safe Diet Advancement: Good Barriers to Reach Goals: Cognitive deficits      Swallow Study   General HPI: Blake Wilson is a 66 y.o. male presenting with weakness/AMS after fall. PMH is significant for CAD, HFrEF, Dilated Cardiomyopathy, HTN, T2DM with neuropathy, Equinus Deformity of Foot, Alcohol Abuse, SLE, Obesity, Glaucoma, and History of Low TSH. CT positive only for chronic areas of infarction including R frontal, R parietal, L frontal, L external capsule and periventricular white matter, L superior cerebellum and central pons. Found to have UTI. MRI 12/14/16 revealed acute ischemic nonhemorrhagic pontine infarct. Pt reportedly with new stutter, slowed speech. Bedside swallow and cog-linguistic eval ordered. Pt currently NPO, no stroke swallow screen in chart. Type of Study: Bedside Swallow Evaluation Previous Swallow Assessment: none in chart Diet Prior to this Study: NPO Temperature Spikes Noted: No Respiratory Status: Room air History of Recent Intubation: No Behavior/Cognition: Alert;Cooperative Oral Cavity Assessment: Within Functional  Limits Oral Care Completed by SLP: No Oral  Cavity - Dentition: Missing dentition;Poor condition Vision: Functional for self-feeding Self-Feeding Abilities: Able to feed self Patient Positioning: Upright in bed Baseline Vocal Quality: Low vocal intensity Volitional Cough: Weak Volitional Swallow: Able to elicit    Oral/Motor/Sensory Function Overall Oral Motor/Sensory Function: Mild impairment Facial ROM: Within Functional Limits Facial Symmetry: Within Functional Limits Facial Strength: Within Functional Limits Facial Sensation: Within Functional Limits Lingual ROM: Within Functional Limits Lingual Symmetry: Within Functional Limits Lingual Strength: Reduced Lingual Sensation: Within Functional Limits Velum: Within Functional Limits Mandible: Impaired   Ice Chips Ice chips: Within functional limits   Thin Liquid Thin Liquid: Impaired Presentation: Cup Oral Phase Impairments: Poor awareness of bolus Oral Phase Functional Implications: Right anterior spillage Pharyngeal  Phase Impairments: Cough - Immediate    Nectar Thick Nectar Thick Liquid: Impaired Presentation: Cup Pharyngeal Phase Impairments: Wet Vocal Quality   Honey Thick Honey Thick Liquid: Not tested   Puree Puree: Within functional limits   Solid   GO  Blake Wilson, [place] CF-SLP Speech-Language Pathologist [phone] Solid: Impaired Oral Phase Impairments: Impaired mastication Oral Phase Functional Implications: Oral residue        Blake Wilson 12/15/2016,2:10 PM

## 2016-12-15 NOTE — Progress Notes (Deleted)
Arrived from Ed to 5M10. Alert and oriented. Oriented to room. Call light within reach.

## 2016-12-15 NOTE — Progress Notes (Signed)
Family Medicine Teaching Service Daily Progress Note Intern Pager: [phone]  Patient name: Blake Wilson Medical record number: [id number] Date of birth: 1951-06-10 Age: 66 y.o. Gender: male  Primary Care Provider: Luiz Blare, DO Consultants: None Code Status: Full  Pt Overview and Major Events to Date:  2/23- Admitted for AMS and weakness  Assessment and Plan: Blake Wilson is a 66 y.o. male presenting with weakness/AMS after fall. PMH is significant for CAD, HFrEF, Dilated Cardiomyopathy, HTN, T2DM with neuropathy, Equinus Deformity of Foot, Alcohol Abuse, SLE, Obesity, Glaucoma, and History of Low TSH.  #Acute Ischemic Pontine CVA:  MRI brain: Acute ischemic nonhemorrhagic pontine infarct. No associated mass effect.  Multiple chronic infarcts seen.  Abnormal flow void within the distal left vertebral artery, compatible with slow flow and/or occlusion. This is suspected to be chronic in nature given the presence of multiple remote left cerebellar infarcts. Remainder of the intravascular flow voids are maintained.  A1c 10/2016 10.0. TSH 3.699. Lipid: total 249, HDL 42, LDL 178.  Last echo 2015 EF 25-30% w/ diffuse hypokinesis - CTA head/ neck ordered - Echo - PT/OT/SLP to assess - Diet held this am, given report of dysphagia.  Will have SLP eval and order diet accordingly.   - c/s Neuro: CTA head/ neck, High dose Lipitor, ok to titrate BP meds for control.  # Dysarthria/ Stutter: AOx3 on admission but slowed speech. Appears to know his medications from recent office visits but incorrectly named his lantus dose (10 U vs 30 u recently recommended).  CVA may play a part. CT but chronic ischemic changes noted within multiple areas.  MRI as above.  HIV neg, RPR neg, B12 WNL, Folate WNL. UDS neg. - c/s neuro as above - Blood cultures x 2 NGTD  # UTI: Too numerous to count WBC on UA with leukocytes but rare bacteria and negative nitrites. No CVA tenderness, leukocytosis or fever to  suggest more systemic infection. Afebrile. UCx w/ Coag neg staph sensitive to Cipro, Clinda, Gent, Nitrofurantoin, Septra - Keflex 2/24-2/25 s/p ceftriaxone.   - Start Cipro 250mg  BID x7 days (2/25>) - Blood culture NGTD  # Weakness: Sensation intact in feet bilaterally. History of diabetic neuropathy but denies taking his prescribed gabapentin. - Thiamine per CIWA protocol - Orthostatic vital signs negative - PT/OT consult  # Elevated CK/Possible Rhabdomyolysis: Suspect 2/2 prolonged immobilization after fall. No complaint of myalgias and no AKI. s/p 1 L IVFs in ED - Had light IVFs overnight given reduced EF (75 mL/hr x 12 hours) - continue PO hydration when completed - Ck trending down - Hold lasix for now.    # Dilated Cardiomyopathy/CHF: Last EF 25-30% in 2015. Patient part of a research trial and is supposed to be on entresto, coreg, imdur but only reports being on entresto. Also reports taking lasix 40 mg daily.  - Continue entresto BID - Restart imdur if becomes hypertensive - Hold lasix for now.  No evidence of fluid overload.  Plan for CTA w/ contrast.  Would plan to add back Lasix soon given significant HF history.  # Diabetes: Last A1c 10.0 on 11/05/16. Reports taking 10 U lantus, though was recommended to increase to 30 U at last Office Visit. Takes metformin 1000 mg BID. BG 93-165/24h - Continue Lantus 5 U nightly - Sensitive SSI - Hold home metformin  # History of alcohol abuse: Reports only occasional drinking. Last drink 2/21. Ethanol level neg. - CIWA protocol - scores of 0  # Hx CAD:  -  Continue home aspirin - CK levels no longer in rhabdo range.  Given CVA, will restart statin.  FEN/GI: Diet held this am, given report of dysphagia.  Will have SLP eval and order diet accordingly. Prophylaxis: Lovenox  Disposition: Pending improvement in mental status, neuro and PT recs  Subjective:  Patient reports that slurred speech is stable from admission.  No new  neurologic changes.  Denies weakness, tingling, blurred vision.  Endorses sensation of dysphagia.  He notes that he feels like food gets stuck in his throat.  Objective: Temp:  [97.3 F (36.3 C)-98 F (36.7 C)] 97.3 F (36.3 C) (02/25 0522) Pulse Rate:  [50-67] 50 (02/25 0522) Resp:  [18-20] 20 (02/25 0522) BP: (146-165)/(50-60) 158/60 (02/25 0522) SpO2:  [97 %-100 %] 97 % (02/25 0522) Physical Exam: General: Alert male, resting in bed, in NAD Cardiovascular: RRR, S1, S2, no m/r/g Respiratory: CTAB, no increased WOB Gastrointestinal: Soft, +BS, distended, no rebound or guarding MSK: no LE edema, 4/5 strength UE and LE w/ RUE seemingly weaker compared to left.   Derm: Scratches across shins bilaterally, elongated and thickened toenails Neuro: AOx3, LE cerebellar testing normal.  Slight dysmetria appreciated on UE testing.  Patellar DTRs 1/4 bilaterally. Light touch sensation in tact LE and UE.  CN 2-12 grossly in tact.  Palate rise appears symmetric.  Attempts to answer questions but speech slurred.   Psych: Flat affect, normal mood. Odd  Laboratory:  Recent Labs Lab 12/13/16 1251 12/14/16 0254  WBC 8.8 5.8  HGB 13.9 12.5*  HCT 40.0 37.4*  PLT 355 277    Recent Labs Lab 12/13/16 1251 12/14/16 0254  NA 138 138  K 4.1 3.3*  CL 105 105  CO2 22 23  BUN 12 12  CREATININE 0.90 0.86  CALCIUM 9.1 8.9  PROT  --  6.3*  BILITOT  --  0.8  ALKPHOS  --  67  ALT  --  19  AST  --  27  GLUCOSE 124* 132*   TSH 3.7  Recent Results (from the past 240 hour(s))  Urine culture     Status: Abnormal   Collection Time: 12/13/16  1:47 PM  Result Value Ref Range Status   Specimen Description URINE, CATHETERIZED  Final   Special Requests NONE  Final   Culture (A)  Final    >=100,000 COLONIES/mL STAPHYLOCOCCUS SPECIES (COAGULASE NEGATIVE)   Report Status 12/15/2016 FINAL  Final   Organism ID, Bacteria STAPHYLOCOCCUS SPECIES (COAGULASE NEGATIVE) (A)  Final      Susceptibility    Staphylococcus species (coagulase negative) - MIC*    CIPROFLOXACIN <=0.5 SENSITIVE Sensitive     GENTAMICIN <=0.5 SENSITIVE Sensitive     NITROFURANTOIN <=16 SENSITIVE Sensitive     OXACILLIN <=0.25 SENSITIVE Sensitive     TETRACYCLINE <=1 SENSITIVE Sensitive     VANCOMYCIN 1 SENSITIVE Sensitive     TRIMETH/SULFA <=10 SENSITIVE Sensitive     CLINDAMYCIN <=0.25 SENSITIVE Sensitive     RIFAMPIN <=0.5 SENSITIVE Sensitive     Inducible Clindamycin NEGATIVE Sensitive     * >=100,000 COLONIES/mL STAPHYLOCOCCUS SPECIES (COAGULASE NEGATIVE)  Culture, blood (routine x 2)     Status: None (Preliminary result)   Collection Time: 12/13/16  7:25 PM  Result Value Ref Range Status   Specimen Description BLOOD RIGHT ANTECUBITAL  Final   Special Requests BOTTLES DRAWN AEROBIC AND ANAEROBIC 5CC  Final   Culture NO GROWTH < 24 HOURS  Final   Report Status PENDING  Incomplete  Culture,  blood (routine x 2)     Status: None (Preliminary result)   Collection Time: 12/13/16  7:37 PM  Result Value Ref Range Status   Specimen Description BLOOD RIGHT ARM  Final   Special Requests   Final    BOTTLES DRAWN AEROBIC AND ANAEROBIC BLUE 4CC RED 3CC   Culture NO GROWTH < 24 HOURS  Final   Report Status PENDING  Incomplete   Imaging/Diagnostic Tests: Ct Head Wo Contrast  Result Date: 12/13/2016 CLINICAL DATA:  Expressive aphasia.  Fall. EXAM: CT HEAD WITHOUT CONTRAST TECHNIQUE: Contiguous axial images were obtained from the base of the skull through the vertex without intravenous contrast. COMPARISON:  None. FINDINGS: Brain: Image quality degraded by motion Generalized mild atrophy. Multiple areas of chronic infarction involving the right frontal lobe and right parietal lobe. Chronic infarct in the left frontal lobe and in the left external capsule and periventricular white matter. Chronic infarct in the central pons. Chronic infarct in the left superior cerebellum. No acute infarct identified by CT. Negative for  hemorrhage or mass. No shift of the midline structures. Vascular: No hyperdense vessel or unexpected calcification. Skull: Negative Sinuses/Orbits: Negative Other: None IMPRESSION: Extensive chronic ischemic changes as above. No acute intracranial abnormality. Image quality degraded by motion. Electronically Signed   By: Franchot Gallo M.D.   On: 12/13/2016 13:16   Mr Jeri Cos [id number] Contrast  Result Date: 12/14/2016 CLINICAL DATA:  Initial evaluation for acute speech difficulty, altered mental status, fall. EXAM: MRI HEAD WITHOUT AND WITH CONTRAST TECHNIQUE: Multiplanar, multiecho pulse sequences of the brain and surrounding structures were obtained without and with intravenous contrast. CONTRAST:  93mL MULTIHANCE GADOBENATE DIMEGLUMINE 529 MG/ML IV SOLN COMPARISON:  Prior CT from 12/13/2016. FINDINGS: Brain: Diffuse prominence of the CSF containing spaces is compatible with generalized age-related cerebral atrophy. Patchy and confluent T2/FLAIR hyperintensity within the periventricular and deep white matter both cerebral hemispheres most compatible chronic microvascular ischemic disease. Multiple scattered areas of encephalomalacia are present within the anterior frontal lobes bilaterally, compatible with remote ischemic infarcts. Additional small remote right parietal and occipital lobe infarcts present as well. Remote lacunar infarcts involve the bilateral basal ganglia. Small amount of chronic hemorrhage noted with the remote left basal ganglia infarct. Scattered remote left cerebellar infarcts present as well. There is abnormal restricted diffusion involving the central and left paramedian aspect of the pons, compatible with acute ischemic infarct (series 4, image 17). This is most predominant at the ventral aspect of the pons. No associated hemorrhage or significant mass effect. No other evidence for acute or subacute ischemia. Gray-white matter differentiation otherwise maintained. No mass lesion, midline  shift, or mass effect. No hydrocephalus. No extra-axial fluid collection. Major dural sinuses are grossly patent. No abnormal enhancement. Pituitary gland and suprasellar region within normal limits. Vascular: Abnormal flow void within the distal left vertebral artery, which may be related to slow flow and/ or occlusion (series 7, image 3). This is suspected to be chronic in nature given the remote left cerebellar infarcts. Normal flow voids seen within the remainder the vertebrobasilar system. Intravascular flow voids preserved within the anterior circulation as well. Skull and upper cervical spine: Craniocervical junction within normal limits. Visualized upper cervical spine unremarkable. Bone marrow signal intensity within normal limits. No scalp soft tissue abnormality. Sinuses/Orbits: Globes and oval soft tissues within normal limits. Mild scattered mucosal thickening within the ethmoidal air cells and maxillary sinuses. Paranasal sinuses are otherwise clear. No mastoid effusion. Inner ear structures grossly normal. Other: No  other significant finding. IMPRESSION: 1. Acute ischemic nonhemorrhagic pontine infarct as above. No associated mass effect. 2. Multiple additional remote/chronic ischemic infarcts as detailed above. 3. Generalized age-related cerebral atrophy with moderate chronic microvascular ischemic disease. 4. Abnormal flow void within the distal left vertebral artery, compatible with slow flow and/or occlusion. This is suspected to be chronic in nature given the presence of multiple remote left cerebellar infarcts. Remainder of the intravascular flow voids are maintained. Electronically Signed   By: Jeannine Boga M.D.   On: 12/14/2016 22:15    Janora Norlander, DO 12/15/2016, 8:30 AM PGY-3, [place] Intern pager: [phone], text pages welcome

## 2016-12-15 NOTE — Consult Note (Signed)
Neurology Consult Note  Reason for Consultation: Stroke  Requesting provider: Talbert Cage M.D.  CC: Difficulty speaking  HPI: This is a 66 year old right-handed man who is a somewhat limited historian. He reports that he was in his usual state of health until 12/13/16 when he slipped and fell on some water. He was unable to get up on his own and was brought to the emergency department for further evaluation. He is currently not reporting any particular symptoms. He denies any weakness or numbness. He has not had any vision changes. He denies difficulty swallowing. He does endorse some slurred speech but says this has been going on for a couple of weeks. Additional information is obtained from review of the patient's medical record.  According to the ED notes, the patient actually fell on 12/12/16. He reported he slipped on some wet ground. He was found by management of the facility where he lives. He denied any symptoms on presentation. He was admitted with a diagnosis of possible altered mental status. Workup included MRI scan of the brain which revealed an acute infarction involving the left pons. Neurology consultation is now requested for further recommendations.  PMH:  Past Medical History:  Diagnosis Date  . CHF (congestive heart failure) ([place])   . Diabetes mellitus   . Hyperlipidemia   . Lupus     PSH:  History reviewed. No pertinent surgical history.  Family history: Family History  Problem Relation Age of Onset  . Cirrhosis Brother   . Alcohol abuse Brother   . Stroke Maternal Grandmother   . Cancer Brother     throat cancer (smoker)    Social history:  Social History   Social History  . Marital status: Single    Spouse name: N/A  . Number of children: N/A  . Years of education: N/A   Occupational History  . Not on file.   Social History Main Topics  . Smoking status: Former Smoker    Quit date: 01/23/2001  . Smokeless tobacco: Former Systems developer  . Alcohol use  0.6 oz/week    1 Cans of beer per week  . Drug use: Unknown  . Sexual activity: Yes    Birth control/ protection: None   Other Topics Concern  . Not on file   Social History Narrative  . No narrative on file    Current outpatient meds: Reviewed with patient Current Meds  Medication Sig  . aspirin 81 MG tablet Take 81 mg by mouth daily. After a meal   . atorvastatin (LIPITOR) 80 MG tablet Take 1 tablet (80 mg total) by mouth at bedtime.  . carvedilol (COREG) 25 MG tablet Take 1 tablet (25 mg total) by mouth 2 (two) times daily.  . furosemide (LASIX) 40 MG tablet Take 1 tablet (40 mg total) by mouth daily.  . Insulin Glargine (LANTUS) 100 UNIT/ML Solostar Pen Inject 23 Units into the skin daily. Increase by 1 unit per day until fasting blood glucose less than 150 mg/dL.  Do not increase to more than 30 units daily  . metFORMIN (GLUCOPHAGE) 1000 MG tablet Take 1 tablet (1,000 mg total) by mouth 2 (two) times daily with a meal.  . nitroGLYCERIN (NITROSTAT) 0.4 MG SL tablet Place 1 tablet (0.4 mg total) under the tongue every 5 (five) minutes as needed for chest pain.  . sacubitril-valsartan (ENTRESTO) 97-103 MG Take 1 tablet by mouth 2 (two) times daily.  . travoprost, benzalkonium, (TRAVATAN) 0.004 % ophthalmic solution Place 1 drop into both eyes  at bedtime.     Current inpatient meds: Reviewed and reconciled Current Facility-Administered Medications  Medication Dose Route Frequency Provider Last Rate Last Dose  .  stroke: mapping our early stages of recovery book   Does not apply Once Ashly M Gottschalk, DO      . aspirin chewable tablet 81 mg  81 mg Oral Daily Rogue Bussing, MD   81 mg at 12/15/16 0847  . cephALEXin (KEFLEX) capsule 500 mg  500 mg Oral Q6H Katheren Shams, DO   500 mg at 12/15/16 [id number]  . enoxaparin (LOVENOX) injection 40 mg  40 mg Subcutaneous Q24H Rogue Bussing, MD   40 mg at 12/14/16 1950  . folic acid (FOLVITE) tablet 1 mg  1 mg Oral Daily  Rogue Bussing, MD   1 mg at 12/15/16 0846  . insulin aspart (novoLOG) injection 0-9 Units  0-9 Units Subcutaneous TID WC Rogue Bussing, MD   2 Units at 12/14/16 1155  . insulin glargine (LANTUS) injection 5 Units  5 Units Subcutaneous QHS Rogue Bussing, MD   5 Units at 12/14/16 2209  . LORazepam (ATIVAN) tablet 1 mg  1 mg Oral Q6H PRN Rogue Bussing, MD       Or  . LORazepam (ATIVAN) injection 1 mg  1 mg Intravenous Q6H PRN Rogue Bussing, MD      . multivitamin with minerals tablet 1 tablet  1 tablet Oral Daily Rogue Bussing, MD   1 tablet at 12/15/16 [phone]  . sacubitril-valsartan (ENTRESTO) 97-103 mg per tablet  1 tablet Oral BID Rogue Bussing, MD   1 tablet at 12/15/16 [phone]  . thiamine (B-1) injection 100 mg  100 mg Intravenous Daily Rogue Bussing, MD   100 mg at 12/15/16 0846  . travoprost (benzalkonium) (TRAVATAN) 0.004 % ophthalmic solution 1 drop  1 drop Both Eyes QHS Rogue Bussing, MD   1 drop at 12/14/16 2209    Allergies: No Known Allergies  ROS: As per HPI. A full 14-point review of systems was performed and is otherwise unremarkable.   PE:  BP (!) 158/60 (BP Location: Right Arm)   Pulse (!) 50   Temp 97.3 F (36.3 C) (Oral)   Resp 20   SpO2 97%   General: WDWN, no acute distress. AAO x4. Speech with moderate dysarthria. No aphasia. Follows commands briskly. Affect is flat. Comportment is normal.  HEENT: Normocephalic. Neck supple without LAD. MMM, OP clear. Dentition good. Sclerae anicteric. No conjunctival injection.  CV: Heart sounds are distant but regular, no murmur. Carotid pulses full and symmetric, no bruits. Distal pulses 2+ and symmetric.  Lungs: CTAB.  Abdomen: Soft, obese, non-distended, non-tender. Bowel sounds present x4.  Extremities: No C/C/E. Neuro:  CN: Pupils are equal and round. They are symmetrically reactive from 3-->2 mm. EOMI without nystagmus. No reported  diplopia. Facial sensation is intact to light touch. Face is notable for very slight weakness with eye closure on the left. Hearing is intact to conversational voice. Palate elevates symmetrically and uvula is midline. Voice is normal in tone, pitch and quality. Bilateral SCM and trapezii are 5/5. Tongue is midline with normal bulk and mobility.  Motor: Normal bulk, tone, and strength throughout with the exception of 4+/5 strength with left wrist extension and finger extension. He also appears to have some mild weakness in the right lower extremity diffusely but effort is extremely variable. No tremor or other abnormal movements. He has a slight  right pronator drift.  Sensation: Intact to light touch.  DTRs: 2+, symmetric in the arms, absent in the legs. Toes mute bilaterally. No pathologic reflexes.  Coordination: Finger-to-nose and heel-to-shin are without dysmetria but are slower and more deliberate on the right. Finger taps are slower on the right.    Labs:  Lab Results  Component Value Date   WBC 5.8 12/14/2016   HGB 12.5 (L) 12/14/2016   HCT 37.4 (L) 12/14/2016   PLT 277 12/14/2016   GLUCOSE 132 (H) 12/14/2016   CHOL 249 (H) 11/14/2016   TRIG 146 11/14/2016   HDL 42 11/14/2016   LDLDIRECT 71 10/07/2012   LDLCALC 178 (H) 11/14/2016   ALT 19 12/14/2016   AST 27 12/14/2016   NA 138 12/14/2016   K 3.3 (L) 12/14/2016   CL 105 12/14/2016   CREATININE 0.86 12/14/2016   BUN 12 12/14/2016   CO2 23 12/14/2016   TSH 3.699 12/14/2016   PSA 0.41 11/04/2007   INR 1.0 06/26/2007   HGBA1C 10.0 11/05/2016    Urinalysis notable for moderate hemoglobin, ketones 5, protein 100, small leukocyte esterase, negative nitrites, white blood cells too numerous to count  Urine culture with greater than 100,000 colony forming units of coagulase-negative staph  Urine drug screen negative  Blood culture no growth to date 2  TSH 3.699  RPR nonreactive  Folate 17.7  HIV nonreactive  B12  247    Imaging:  I have personally and independently reviewed the MRI scan of the brain without contrast from 12/14/16. This shows restricted diffusion involving the anterior aspect of the left pons, consistent with acute ischemic infarction. Goal areas of encephalomalacia are noted in both frontal lobes as well as the right parietal and occipital lobes. These are consistent with remote infarctions. Old infarctions are also noted in the left cerebellar hemisphere. Moderate chronic small vessel ischemic disease and noted in the bihemispheric white matter. Of note, the flow-void in the left vertebral artery is absent, consistent with a very slow flow versus occlusion.  TTE pending  Carotid Dopplers pending   Assessment and Plan:  1. Acute Ischemic Stroke: This is an acute stroke involving the left pontine perforators. Known risk factors for cerebrovascular disease in this patient include prior stroke, hyperlipidemia, hypertension, diabetes, obesity, history of alcohol abuse, age. LDL is currently above goal at 178. Hemoglobin A1c is also above goal at 10. TTE has been ordered and is pending. Carotid Dopplers also ordered. However, would recommend additional imaging with either MRA or CTA of the head and neck instead of carotid Dopplers given that we need to assess his posterior circulation in the setting of pontine infarct. Carotid Dopplers will not allow for this. Further testing will be determined by results from these initial studies. Recommend antiplatelet therapy with aspirin 325 mg daily for secondary stroke prevention. Start atorvastatin 80 mg daily with goal LDL less than 70. Ensure adequate glucose control. Lisinopril for his hypertension at this point and blood pressure can be gradually titrated to goal systolic blood pressure less than 120 mmHg as needed. Avoid fever and hyperglycemia as these can extend the infarct. Initiate rehab services--he would likely benefit from acute rehabilitation. DVT  prophylaxis as needed.   2. Dysarthria: This is acute, due to stroke. Speech therapy, rehabilitation.  3. Right hemiparesis: This is acute, due to stroke. He has upper motor neuron pattern weakness on the right side, with variable effort on examination making precise level of weakness difficult to ascertain at this time.  PT/OT/rehabilitation.  4. Left facial weakness: He has very mild weakness of eye closure on the left side. This is consistent with left pontine stroke and would be considered acute. This can be followed.    This was discussed with the patient. He is in agreement with the plan as noted above. He was given the option to ask any questions or address to his satisfaction.  Thank you for this consultation. The stroke team will assume care of the patient getting 12/16/16. Please call with any urgent questions or concerns.

## 2016-12-16 ENCOUNTER — Inpatient Hospital Stay (HOSPITAL_COMMUNITY): Payer: PPO

## 2016-12-16 DIAGNOSIS — I5022 Chronic systolic (congestive) heart failure: Secondary | ICD-10-CM

## 2016-12-16 DIAGNOSIS — I6789 Other cerebrovascular disease: Secondary | ICD-10-CM

## 2016-12-16 DIAGNOSIS — I6340 Cerebral infarction due to embolism of unspecified cerebral artery: Secondary | ICD-10-CM

## 2016-12-16 DIAGNOSIS — R9431 Abnormal electrocardiogram [ECG] [EKG]: Secondary | ICD-10-CM

## 2016-12-16 DIAGNOSIS — I6302 Cerebral infarction due to thrombosis of basilar artery: Secondary | ICD-10-CM

## 2016-12-16 DIAGNOSIS — R4701 Aphasia: Secondary | ICD-10-CM

## 2016-12-16 DIAGNOSIS — I679 Cerebrovascular disease, unspecified: Secondary | ICD-10-CM

## 2016-12-16 HISTORY — DX: Abnormal electrocardiogram (ECG) (EKG): R94.31

## 2016-12-16 LAB — BASIC METABOLIC PANEL
ANION GAP: 9 (ref 5–15)
BUN: 5 mg/dL — ABNORMAL LOW (ref 6–20)
CHLORIDE: 107 mmol/L (ref 101–111)
CO2: 21 mmol/L — AB (ref 22–32)
Calcium: 8.5 mg/dL — ABNORMAL LOW (ref 8.9–10.3)
Creatinine, Ser: 0.79 mg/dL (ref 0.61–1.24)
GFR calc non Af Amer: >60 mL/min (ref >60)
Glucose, Bld: 101 mg/dL — ABNORMAL HIGH (ref 65–99)
Potassium: 3.9 mmol/L (ref 3.5–5.1)
SODIUM: 137 mmol/L (ref 135–145)

## 2016-12-16 LAB — GLUCOSE, CAPILLARY
GLUCOSE-CAPILLARY: 100 mg/dL — AB (ref 65–99)
Glucose-Capillary: 114 mg/dL — ABNORMAL HIGH (ref 65–99)
Glucose-Capillary: 108 mg/dL — ABNORMAL HIGH (ref 65–99)
Glucose-Capillary: 133 mg/dL — ABNORMAL HIGH (ref 65–99)

## 2016-12-16 LAB — CK: Total CK: 197 U/L (ref 49–397)

## 2016-12-16 LAB — ECHOCARDIOGRAM COMPLETE

## 2016-12-16 MED ORDER — ISOSORBIDE MONONITRATE ER 30 MG PO TB24
30.0000 mg | ORAL_TABLET | Freq: Every day | ORAL | Status: DC
  Administered 2016-12-16 – 2016-12-18 (×3): 30 mg via ORAL
  Filled 2016-12-16 (×2): qty 1

## 2016-12-16 MED ORDER — PERFLUTREN LIPID MICROSPHERE
1.0000 mL | INTRAVENOUS | Status: AC | PRN
  Administered 2016-12-16: 2 mL via INTRAVENOUS
  Filled 2016-12-16: qty 10

## 2016-12-16 MED ORDER — VITAMIN B-1 100 MG PO TABS
100.0000 mg | ORAL_TABLET | Freq: Every day | ORAL | Status: DC
  Administered 2016-12-16 – 2016-12-18 (×3): 100 mg via ORAL
  Filled 2016-12-16 (×3): qty 1

## 2016-12-16 NOTE — Progress Notes (Signed)
  Echocardiogram 2D Echocardiogram has been performed.  Jennette Dubin 12/16/2016, 10:20 AM

## 2016-12-16 NOTE — Discharge Summary (Signed)
[facility] Discharge Summary  Patient name: Blake Wilson Medical record number: [id number] Date of birth: 26-Oct-1950 Age: 66 y.o. Gender: male Date of Admission: 12/13/2016  Date of Discharge: 12/18/2016 Admitting Physician: Baird Kay, MD  Primary Care Provider: Luiz Blare, DO Consultants: Neurology, cardiology  Indication for Hospitalization: Altered mental status, mechanical fall  Discharge Diagnoses/Problem List:  Acute Ischemic [place] CVA Urinary tract infection Dilated non-ischemic cardiomyopathy Type 2 diabetes Coronary artery disease  Disposition: SNF  Discharge Condition: stable  Discharge Exam:  Temp:  [97.3 F (36.3 C)-98.4 F (36.9 C)] 97.3 F (36.3 C) (02/28 0513) Pulse Rate:  [49-88] 60 (02/28 0513) Resp:  [11-22] 18 (02/28 0513) BP: (102-175)/(40-88) 120/88 (02/28 0513) SpO2:  [94 %-100 %] 99 % (02/28 0513) Physical Exam: General: Patient rests in bed, NAD Cardiovascular: RRR, no MRG Respiratory: CTAB, no increased WOB Gastrointestinal: Soft, +BS, distended, no rebound or guarding MSK: no gross deformities or edema Neuro: Neuro: AOx3, LE cerebellar testing normal.  Slight dysmetria appreciated on UE testing.  Patellar DTRs 1/4 bilaterally. Light touch sensation in tact LE and UE.  CN 2-12 grossly in tact.  Palate rise appears symmetric.  Attempts to answer questions but speech slurred. Psych: Appropriately frustrated with inability to answer questions, cannot assess thought process due to dysarthria/aphasia  Brief Hospital Course:  Patient presented to emergency department with weakness/altered mental status after a reported fall where he slipped on some water and was too weak to GET back up. He was alert and oriented upon admission, but had slowed speech intermittent dysarthria. Also noted to have a expressive aphasia and right sided hemiparesis.    Rhabdomyolysis, mild The patient was noted to have CK  elevated at 1044 on admission from time down, this trended down to normal with IVF.  UTI UA showed signs suggestive of infection and urine culture grew greater than 100,000 CFU's of coagulase negative staph that was pan-sensitive. Initially started on Keflex and then transitioned to ciprofloxacin.   CVA - presentation with dysarthria, expressive aphasia, and left sided weakness - CT showed chronic atrophy with no focal findings. UDS came back negative and LFTs within normal limits. - Neurology was consulted and MRI was obtained that showed acute ischemic nonhemorrhagic pontine infarct. No associated mass effect.  Multiple chronic infarcts seen.  Abnormal flow void within the distal left vertebral artery, compatible with slow flow and/or occlusion. This is suspected to be chronic in nature given the presence of multiple remote left cerebellar infarcts.  - CT angio neck showed occcluded LEFT vertebral artery, thready distal V2 reconstitution, occluded at distal LEFT V3 segment.  - Cardiac Echo showed diffuse hypokinesis and improved EF to 45-50% from last echo and G1DD.  - Neurology recommended ASA 325mg  and clopidogrel 93mh daily and to continue x3 months and then plavix alone. Patient was evaluated by PT and OT who both recommended SNF and SLP determined that dysphagia 3 diet. Cardiology performed TEE that showed LVEF 40-45%, diffuse hypokinesis, mild aortic and mitral regurgitation (full report below) .   - At the time of discharge patient was showing improvement and was breathing comfortable on room air and was without pain and tolerating diet and was appropriate for discharge to SNF. - risk stratification labs and full imaging reports are listed below.  Issues for Follow Up:  1. CVA: Neuro follow-up. Dysphagia 2 diet.  ASA 325mg  and clopidogrel 71mh daily and to continue x3 months (through 03/17/2017) and then continue plavix alone thereafter.  2. Chronic systolic heart failure: continue Entresto,  Imdur, carvedilol and will likely require chronic diuretic therapy. Takes 40mg  lasix daily.  3. Coreg was decreased to 6.25 mg BID from 25 mg BID as he was bradycardic to 60s. Please titrate back up as needed. 4. UTI: the patient was continued on ciprofloxacin 250 mg BID 2/25 through 12/21/2016. 5. Neurology follow up: Patient to schedule neurology follow up within the next 6 weeks. 6. Cardiology follow up: Patient should follow up with cardiology for further workup of his stroke with continuous ambulatory monitor as an outpatient.  7. Diabetes:  Patient was noted to be on 10 units of Lantus daily and 1000 mg of metformin at home. The patient was managed on 5 units of Lantus on hospitalized and blood sugars remained within the 100s. Will be discharged on 5 units of Lantus with home metformin. Please titrate up Lantus as appropriate at hospital follow-up.  Significant Procedures: None  Significant Labs and Imaging:   Recent Labs Lab 12/13/16 1251 12/14/16 0254 12/15/16 0957  WBC 8.8 5.8 4.3  HGB 13.9 12.5* 13.5  HCT 40.0 37.4* 39.5  PLT 355 277 269    Recent Labs Lab 12/13/16 1251 12/14/16 0254 12/15/16 0957 12/16/16 0414  NA 138 138 136 137  K 4.1 3.3* 4.2 3.9  CL 105 105 105 107  CO2 22 23 24  21*  GLUCOSE 124* 132* 161* 101*  BUN 12 12 7  5*  CREATININE 0.90 0.86 0.79 0.79  CALCIUM 9.1 8.9 8.9 8.5*  ALKPHOS  --  67  --   --   AST  --  27  --   --   ALT  --  19  --   --   ALBUMIN  --  3.1*  --   --    Lipid Panel     Component Value Date/Time   CHOL 249 (H) 11/14/2016 0920   TRIG 146 11/14/2016 0920   HDL 42 11/14/2016 0920   CHOLHDL 5.9 (H) 11/14/2016 0920   VLDL 29 11/14/2016 0920   LDLCALC 178 (H) 11/14/2016 0920   LDLDIRECT 71 10/07/2012 1059   B12 - WNL at 247 HIV - NR Folate - WNL at 17.7 RPR - NR TSH - WNL at 3.99                                                                                                                                                              Ethanol - negative   Transesophageal Cardiac Echo 12/18/2016 Study Conclusions  - Left ventricle: Systolic function was mildly to moderately   reduced. The estimated ejection fraction was in the range of 40%   to 45%. Diffuse hypokinesis. - Aortic valve: There was mild regurgitation. - Mitral valve: There was mild regurgitation. - Left  atrium: No evidence of thrombus in the atrial cavity or   appendage. No evidence of thrombus in the atrial cavity or   appendage. - Right atrium: No evidence of thrombus in the atrial cavity or   appendage. - Atrial septum: No defect or patent foramen ovale was identified   by color flow Doppler or saline microcavitation study.  Transthoracic Echo 12/17/2016 Study Conclusions  - Left ventricle: The cavity size was mildly dilated. Wall   thickness was normal. Systolic function was mildly reduced. The   estimated ejection fraction was in the range of 45% to 50%.   Diffuse hypokinesis. Doppler parameters are consistent with   abnormal left ventricular relaxation (grade 1 diastolic   dysfunction). - Left atrium: The atrium was mildly dilated. - Atrial septum: No defect or patent foramen ovale was identified.  Ct Angio Head W Or Wo Contrast 12/15/2016 FINDINGS: CT HEAD FINDINGS BRAIN: No intraparenchymal hemorrhage, mass effect, midline shift. Old small bifrontal than small to moderate RIGHT parietal lobe infarcts. Old bilateral basal ganglia and LEFT cerebellar infarcts. Evolving nonhemorrhagic LEFT parasagittal pontine infarct. Moderate ventriculomegaly on the basis of global parenchymal brain volume loss. No abnormal extra-axial fluid collections. Basal cisterns are patent. VASCULAR: Moderate calcific atherosclerosis of the carotid siphons. SKULL: No skull fracture. Old minimally depressed LEFT nasal bone fracture. No significant scalp soft tissue swelling. SINUSES/ORBITS: Trace ethmoid mucosal thickening. Mastoid air cells are well aerated. Soft  tissue effacing the RIGHT and partially opacifying the LEFT ostiomeatal units most compatible with cerumen. The included ocular globes and orbital contents are non-suspicious. OTHER: None. CTA NECK AORTIC ARCH: Normal appearance of the thoracic arch, mild calcific atherosclerosis. Two vessel arch is a normal variant. The origins of the innominate, left Common carotid artery and subclavian artery are widely patent. RIGHT CAROTID SYSTEM: Common carotid artery is widely patent, mild luminal irregularity compatible with atherosclerosis. Normal appearance of the carotid bifurcation without hemodynamically significant stenosis by NASCET criteria. Small, irregular RIGHT internal carotid artery without dissection or focal stenosis. LEFT CAROTID SYSTEM: Common carotid artery is widely patent, mild luminal irregularity compatible with atherosclerosis. Normal appearance of the carotid bifurcation without hemodynamically significant stenosis by NASCET criteria, mild eccentric calcific atherosclerosis. Mild luminal irregularity of the LEFT cervical internal carotid artery. VERTEBRAL ARTERIES:LEFT vertebral artery occluded within 1 centimeter of the origin due to eccentric intimal thickening. Thready reconstitution distal LEFT V2 segment, occluded at distal LEFT V3 segment. RIGHT vertebral artery is widely patent. Mild extrinsic luminal narrowing due to degenerative cervical spine. SKELETON: No acute osseous process though bone windows have not been submitted. Poor dentition. Moderate degenerative change of the cervical spine. OTHER NECK: Soft tissues of the neck are non-acute though, not tailored for evaluation. CTA HEAD ANTERIOR CIRCULATION: Patent bilateral internal carotid arteries. Severe stenosis RIGHT para ophthalmic internal carotid artery presumably from atherosclerosis. Occluded RIGHT supraclinoid internal carotid artery. Thready reconstitution of RIGHT carotid terminus and proximal RIGHT M1 segment with attenuated  RIGHT needle cerebral artery including severe tandem stenoses RIGHT M1 segment. Diminutive RIGHT A1 segment with widely patent LEFT A1 segment, and anterior communicating artery. Patent bilateral anterior cerebral arteries with moderate luminal irregularity. LEFT middle cerebral artery is patent with moderate luminal irregularity. No contrast extravasation or aneurysm. POSTERIOR CIRCULATION: Occluded LEFT intradural vertebral artery. Moderate luminal irregularity RIGHT V4 segment. Basilar artery is patent. Posterior communicating arteries not present. Moderate luminal irregularity bilateral posterior cerebral arteries. No contrast extravasation or aneurysm. VENOUS SINUSES: Major dural venous sinuses are patent though not tailored  for evaluation on this angiographic examination. ANATOMIC VARIANTS: None. DELAYED PHASE: No abnormal intracranial enhancement. IMPRESSION:  CT HEAD: Evolving nonhemorrhagic LEFT pontine infarct.  Multiple old small vessel and large vascular territory infarcts. CTA NECK: Occluded LEFT vertebral artery, thready distal V2 reconstitution, occluded at distal LEFT V3 segment. Mild long segment narrowing RIGHT cervical internal carotid artery attributable to atherosclerosis or, fibromuscular dysplasia.  CTA HEAD:  Occluded LEFT vertebral artery. Occluded RIGHT supraclinoid internal carotid artery, reconstituted RIGHT carotid terminus with decreased RIGHT middle cerebral artery perfusion. Severely stenotic RIGHT M1 segment. Moderate intracranial atherosclerosis.   Ct Angio Neck W Or Wo Contrast 12/15/2016 FINDINGS: CT HEAD FINDINGS BRAIN: No intraparenchymal hemorrhage, mass effect, midline shift. Old small bifrontal than small to moderate RIGHT parietal lobe infarcts. Old bilateral basal ganglia and LEFT cerebellar infarcts. Evolving nonhemorrhagic LEFT parasagittal pontine infarct. Moderate ventriculomegaly on the basis of global parenchymal brain volume loss. No abnormal extra-axial fluid  collections. Basal cisterns are patent. VASCULAR: Moderate calcific atherosclerosis of the carotid siphons. SKULL: No skull fracture. Old minimally depressed LEFT nasal bone fracture. No significant scalp soft tissue swelling. SINUSES/ORBITS: Trace ethmoid mucosal thickening. Mastoid air cells are well aerated. Soft tissue effacing the RIGHT and partially opacifying the LEFT ostiomeatal units most compatible with cerumen. The included ocular globes and orbital contents are non-suspicious. OTHER: None. CTA NECK AORTIC ARCH: Normal appearance of the thoracic arch, mild calcific atherosclerosis. Two vessel arch is a normal variant. The origins of the innominate, left Common carotid artery and subclavian artery are widely patent. RIGHT CAROTID SYSTEM: Common carotid artery is widely patent, mild luminal irregularity compatible with atherosclerosis. Normal appearance of the carotid bifurcation without hemodynamically significant stenosis by NASCET criteria. Small, irregular RIGHT internal carotid artery without dissection or focal stenosis. LEFT CAROTID SYSTEM: Common carotid artery is widely patent, mild luminal irregularity compatible with atherosclerosis. Normal appearance of the carotid bifurcation without hemodynamically significant stenosis by NASCET criteria, mild eccentric calcific atherosclerosis. Mild luminal irregularity of the LEFT cervical internal carotid artery. VERTEBRAL ARTERIES:LEFT vertebral artery occluded within 1 centimeter of the origin due to eccentric intimal thickening. Thready reconstitution distal LEFT V2 segment, occluded at distal LEFT V3 segment. RIGHT vertebral artery is widely patent. Mild extrinsic luminal narrowing due to degenerative cervical spine. SKELETON: No acute osseous process though bone windows have not been submitted. Poor dentition. Moderate degenerative change of the cervical spine. OTHER NECK: Soft tissues of the neck are non-acute though, not tailored for evaluation. CTA  HEAD ANTERIOR CIRCULATION: Patent bilateral internal carotid arteries. Severe stenosis RIGHT para ophthalmic internal carotid artery presumably from atherosclerosis. Occluded RIGHT supraclinoid internal carotid artery. Thready reconstitution of RIGHT carotid terminus and proximal RIGHT M1 segment with attenuated RIGHT needle cerebral artery including severe tandem stenoses RIGHT M1 segment. Diminutive RIGHT A1 segment with widely patent LEFT A1 segment, and anterior communicating artery. Patent bilateral anterior cerebral arteries with moderate luminal irregularity. LEFT middle cerebral artery is patent with moderate luminal irregularity. No contrast extravasation or aneurysm. POSTERIOR CIRCULATION: Occluded LEFT intradural vertebral artery. Moderate luminal irregularity RIGHT V4 segment. Basilar artery is patent. Posterior communicating arteries not present. Moderate luminal irregularity bilateral posterior cerebral arteries. No contrast extravasation or aneurysm. VENOUS SINUSES: Major dural venous sinuses are patent though not tailored for evaluation on this angiographic examination. ANATOMIC VARIANTS: None. DELAYED PHASE: No abnormal intracranial enhancement. IMPRESSION: CT HEAD: Evolving nonhemorrhagic LEFT pontine infarct. Multiple old small vessel and large vascular territory infarcts. CTA NECK: Occluded LEFT vertebral artery, thready distal V2 reconstitution, occluded  at distal LEFT V3 segment. Mild long segment narrowing RIGHT cervical internal carotid artery attributable to atherosclerosis or, fibromuscular dysplasia. CTA HEAD:  Occluded LEFT vertebral artery. Occluded RIGHT supraclinoid internal carotid artery, reconstituted RIGHT carotid terminus with decreased RIGHT middle cerebral artery perfusion. Severely stenotic RIGHT M1 segment. Moderate intracranial atherosclerosis. Electronically Signed   By: Elon Alas M.D.   On: 12/15/2016 17:17   Mr Jeri Cos [id number] Contrast 12/14/2016 FINDINGS: Brain:  Diffuse prominence of the CSF containing spaces is compatible with generalized age-related cerebral atrophy. Patchy and confluent T2/FLAIR hyperintensity within the periventricular and deep white matter both cerebral hemispheres most compatible chronic microvascular ischemic disease. Multiple scattered areas of encephalomalacia are present within the anterior frontal lobes bilaterally, compatible with remote ischemic infarcts. Additional small remote right parietal and occipital lobe infarcts present as well. Remote lacunar infarcts involve the bilateral basal ganglia. Small amount of chronic hemorrhage noted with the remote left basal ganglia infarct. Scattered remote left cerebellar infarcts present as well. There is abnormal restricted diffusion involving the central and left paramedian aspect of the pons, compatible with acute ischemic infarct (series 4, image 17). This is most predominant at the ventral aspect of the pons. No associated hemorrhage or significant mass effect. No other evidence for acute or subacute ischemia. Gray-white matter differentiation otherwise maintained. No mass lesion, midline shift, or mass effect. No hydrocephalus. No extra-axial fluid collection. Major dural sinuses are grossly patent. No abnormal enhancement. Pituitary gland and suprasellar region within normal limits. Vascular: Abnormal flow void within the distal left vertebral artery, which may be related to slow flow and/ or occlusion (series 7, image 3). This is suspected to be chronic in nature given the remote left cerebellar infarcts. Normal flow voids seen within the remainder the vertebrobasilar system. Intravascular flow voids preserved within the anterior circulation as well. Skull and upper cervical spine: Craniocervical junction within normal limits. Visualized upper cervical spine unremarkable. Bone marrow signal intensity within normal limits. No scalp soft tissue abnormality. Sinuses/Orbits: Globes and oval soft  tissues within normal limits. Mild scattered mucosal thickening within the ethmoidal air cells and maxillary sinuses. Paranasal sinuses are otherwise clear. No mastoid effusion. Inner ear structures grossly normal. Other: No other significant finding.  IMPRESSION:  1. Acute ischemic nonhemorrhagic pontine infarct as above. No associated mass effect.  2. Multiple additional remote/chronic ischemic infarcts as detailed above.  3. Generalized age-related cerebral atrophy with moderate chronic microvascular ischemic disease.  4. Abnormal flow void within the distal left vertebral artery, compatible with slow flow and/or occlusion. This is suspected to be chronic in nature given the presence of multiple remote left cerebellar infarcts.   Results/Tests Pending at Time of Discharge: None  Discharge Medications:  Allergies as of 12/18/2016   No Known Allergies     Medication List    STOP taking these medications   aspirin 81 MG tablet Replaced by:  aspirin 325 MG EC tablet   Insulin Glargine 100 UNIT/ML Solostar Pen Commonly known as:  LANTUS Replaced by:  insulin glargine 100 UNIT/ML injection     TAKE these medications   aspirin 325 MG EC tablet Take 1 tablet (325 mg total) by mouth daily. Start taking on:  12/19/2016 Replaces:  aspirin 81 MG tablet   atorvastatin 80 MG tablet Commonly known as:  LIPITOR Take 1 tablet (80 mg total) by mouth at bedtime.   carvedilol 6.25 MG tablet Commonly known as:  COREG Take 1 tablet (6.25 mg total) by mouth 2 (two) times  daily. What changed:  medication strength  how much to take   ciprofloxacin 250 MG tablet Commonly known as:  CIPRO Take 1 tablet (250 mg total) by mouth 2 (two) times daily.   clopidogrel 75 MG tablet Commonly known as:  PLAVIX Take 1 tablet (75 mg total) by mouth daily. Start taking on:  12/19/2016   furosemide 40 MG tablet Commonly known as:  LASIX Take 1 tablet (40 mg total) by mouth daily.   gabapentin 300 MG  capsule Commonly known as:  NEURONTIN Take 1-3 tablets qhs as needed for lower extremity pain.   insulin glargine 100 UNIT/ML injection Commonly known as:  LANTUS Inject 0.05 mLs (5 Units total) into the skin at bedtime. Replaces:  Insulin Glargine 100 UNIT/ML Solostar Pen   isosorbide mononitrate 30 MG 24 hr tablet Commonly known as:  IMDUR Take 1 tablet (30 mg total) by mouth daily.   metFORMIN 1000 MG tablet Commonly known as:  GLUCOPHAGE Take 1 tablet (1,000 mg total) by mouth 2 (two) times daily with a meal.   nitroGLYCERIN 0.4 MG SL tablet Commonly known as:  NITROSTAT Place 1 tablet (0.4 mg total) under the tongue every 5 (five) minutes as needed for chest pain.   sacubitril-valsartan 97-103 MG Commonly known as:  ENTRESTO Take 1 tablet by mouth 2 (two) times daily.   travoprost (benzalkonium) 0.004 % ophthalmic solution Commonly known as:  TRAVATAN Place 1 drop into both eyes at bedtime.       Discharge Instructions: Please refer to Patient Instructions section of EMR for full details.  Patient was counseled important signs and symptoms that should prompt return to medical care, changes in medications, dietary instructions, activity restrictions, and follow up appointments.   Follow-Up Appointments:  Contact information for follow-up providers    Dennie Bible, NP. Schedule an appointment as soon as possible for a visit in 6 week(s).   Specialty:  Family Medicine Contact information: [address] [place] [address] [place]. Schedule an appointment as soon as possible for a visit in 1 week(s).   Contact information: [place] [phone]           Contact information for after-discharge care    Destination    [facility] Follow up.   Specialty:  [place] information: [place] [phone]                  Everrett Coombe, MD 12/18/2016, 3:14 PM PGY-1, [place]

## 2016-12-16 NOTE — Consult Note (Addendum)
Date: 12/16/2016               Patient Name:  Blake Wilson MRN: [id number]  DOB: Jan 10, 1951 Age / Sex: 66 y.o., male   PCP: Katheren Shams, DO         Requesting Physician: Dr. Lind Covert, MD    Consulting Reason:  Abnormal echo     History of Present Illness: Patient is a 66 yo M with a PMHx of SLE, CAD, HFrEF, dilated cardiomyopathy, HTN, DM2, alcohol abuse, obesity, hx of low TSH who came into the ED via EMS on 12/13/16 for weakness/ AMS after a fall the day prior. Patient complained of speech change for the past few weeks. MRI of the brain revealed an acute non-hemorrhagic pontine infarct. Echo was ordered as a part of stroke workup and revealed diffuse hypokinesis. Cardiology has been consulted to rule out an ischemic etiology. Patient denies having any CP. Reports having chronic dyspnea on exertion. Reports feeling SOB when climbing stairs or after 1 walking for 1 block. Denies having any palpitations. Denies having orthopnea or LE edema. Reports taking Entresto regularly and Lasix prn. States he is not taking Coreg and Imdur at home.   On admission: AAOx3 with dysarthria. Normal CBC and BMP. TSH normal. RPR and HIV non-reactive. Folate and B12 normal. Patient was found to have rhabdomyolysis (CK 1445); down to normal now with IVF hydration. UA positive for leukocyte esterase and moderate Hgb. Urine cx growing >100,000 CFU of coagulase negative staph. EKG from admission showing Q waves in inferior and anteroseptal leads. Echo done today demonstrated LVEF 45-50%, mild LV dilation, diffuse hypokinesis, G1DD, and mild LA dilation. No defect or PFO reported on the echo results. EF improved from previous echo in 2015 (EF 25-30% at that time). Patient is participating in a research trial and is supposed to be on entresto, coreg, imdur but only reports being on entresto.    Meds: Current Facility-Administered Medications  Medication Dose Route Frequency Provider Last Rate Last Dose  .  aspirin EC tablet 325 mg  325 mg Oral Daily Rosalin Hawking, MD   325 mg at 12/16/16 1240  . atorvastatin (LIPITOR) tablet 80 mg  80 mg Oral QHS Ashly M Gottschalk, DO      . carvedilol (COREG) tablet 6.25 mg  6.25 mg Oral BID Ashly M Gottschalk, DO   6.25 mg at 12/16/16 1240  . ciprofloxacin (CIPRO) tablet 250 mg  250 mg Oral BID Koleen Distance Gottschalk, DO   250 mg at 12/16/16 1241  . clopidogrel (PLAVIX) tablet 75 mg  75 mg Oral Daily Rosalin Hawking, MD   75 mg at 12/16/16 1241  . enoxaparin (LOVENOX) injection 40 mg  40 mg Subcutaneous Q24H Rogue Bussing, MD   40 mg at 12/15/16 2056  . folic acid (FOLVITE) tablet 1 mg  1 mg Oral Daily Rogue Bussing, MD   1 mg at 12/16/16 1241  . insulin aspart (novoLOG) injection 0-9 Units  0-9 Units Subcutaneous TID WC Rogue Bussing, MD   2 Units at 12/14/16 1155  . insulin glargine (LANTUS) injection 5 Units  5 Units Subcutaneous QHS Rogue Bussing, MD   5 Units at 12/15/16 2203  . isosorbide mononitrate (IMDUR) 24 hr tablet 30 mg  30 mg Oral Daily Eloise Levels, MD   30 mg at 12/16/16 1242  . LORazepam (ATIVAN) tablet 1 mg  1 mg Oral Q6H PRN Rogue Bussing, MD  Or  . LORazepam (ATIVAN) injection 1 mg  1 mg Intravenous Q6H PRN Rogue Bussing, MD      . multivitamin with minerals tablet 1 tablet  1 tablet Oral Daily Rogue Bussing, MD   1 tablet at 12/16/16 1241  . sacubitril-valsartan (ENTRESTO) 97-103 mg per tablet  1 tablet Oral BID Rogue Bussing, MD   1 tablet at 12/16/16 1246  . thiamine (VITAMIN B-1) tablet 100 mg  100 mg Oral Daily Lind Covert, MD   100 mg at 12/16/16 1240  . travoprost (benzalkonium) (TRAVATAN) 0.004 % ophthalmic solution 1 drop  1 drop Both Eyes QHS Rogue Bussing, MD   1 drop at 12/15/16 2056    Allergies: Allergies as of 12/13/2016  . (No Known Allergies)   Past Medical History:  Diagnosis Date  . CHF (congestive heart failure) ([place])   .  Diabetes mellitus   . Hyperlipidemia   . Lupus    History reviewed. No pertinent surgical history. Family History  Problem Relation Age of Onset  . Cirrhosis Brother   . Alcohol abuse Brother   . Stroke Maternal Grandmother   . Cancer Brother     throat cancer (smoker)   Social History   Social History  . Marital status: Single    Spouse name: N/A  . Number of children: N/A  . Years of education: N/A   Occupational History  . Not on file.   Social History Main Topics  . Smoking status: Former Smoker    Quit date: 01/23/2001  . Smokeless tobacco: Former Systems developer  . Alcohol use 0.6 oz/week    1 Cans of beer per week  . Drug use: Unknown  . Sexual activity: Yes    Birth control/ protection: None   Other Topics Concern  . Not on file   Social History Narrative  . No narrative on file    Review of Systems: Pertinent positives mentioned in HPI. Remainder of all ROS negative.   Physical Exam: Blood pressure (!) 182/66, pulse (!) 54, temperature 97.6 F (36.4 C), temperature source Oral, resp. rate 20, SpO2 100 %. Physical Exam  Constitutional: He is oriented to person, place, and time. He appears well-developed and well-nourished. No distress.  HENT:  Head: Normocephalic and atraumatic.  Eyes: Right eye exhibits no discharge. Left eye exhibits no discharge.  Neck: No JVD present.  Cardiovascular: Normal rate, regular rhythm and intact distal pulses.  Exam reveals no gallop and no friction rub.   Murmur heard. Systolic ejection murmur   Pulmonary/Chest: Effort normal and breath sounds normal. No respiratory distress. He has no wheezes. He has no rales.  Abdominal: Soft. Bowel sounds are normal. He exhibits no distension. There is no tenderness.  Musculoskeletal: He exhibits no edema.  Neurological: He is alert and oriented to person, place, and time.  Slurred speech  Skin: Skin is warm and dry.    Lab results: Basic Metabolic Panel:  Recent Labs  12/15/16 0957  12/16/16 0414  NA 136 137  K 4.2 3.9  CL 105 107  CO2 24 21*  GLUCOSE 161* 101*  BUN 7 5*  CREATININE 0.79 0.79  CALCIUM 8.9 8.5*   Liver Function Tests:  Recent Labs  12/14/16 0254  AST 27  ALT 19  ALKPHOS 67  BILITOT 0.8  PROT 6.3*  ALBUMIN 3.1*   CBC:  Recent Labs  12/14/16 0254 12/15/16 0957  WBC 5.8 4.3  HGB 12.5* 13.5  HCT 37.4* 39.5  MCV 89.5 89.4  PLT 277 269   Cardiac Enzymes:  Recent Labs  12/14/16 0254 12/15/16 0957 12/16/16 0414  CKTOTAL 1,044* 369 197   CBG:  Recent Labs  12/15/16 0612 12/15/16 1120 12/15/16 1722 12/15/16 2110 12/16/16 0616 12/16/16 1140  GLUCAP 102* 138* 111* 128* 100* 114*   Thyroid Function Tests:  Recent Labs  12/14/16 0254  TSH 3.699   Anemia Panel:  Recent Labs  12/14/16 0254  VITAMINB12 247  FOLATE 17.7   Coagulation: No results for input(s): LABPROT, INR in the last 72 hours. Urine Drug Screen: Drugs of Abuse     Component Value Date/Time   LABOPIA NONE DETECTED 12/13/2016 1347   COCAINSCRNUR NONE DETECTED 12/13/2016 1347   LABBENZ NONE DETECTED 12/13/2016 1347   AMPHETMU NONE DETECTED 12/13/2016 1347   THCU NONE DETECTED 12/13/2016 1347   LABBARB NONE DETECTED 12/13/2016 1347    Alcohol Level:  Recent Labs  12/13/16 1955  ETH <5   Urinalysis:  Recent Labs  12/13/16 1347  COLORURINE YELLOW  LABSPEC 1.024  PHURINE 6.0  GLUCOSEU NEGATIVE  HGBUR MODERATE*  BILIRUBINUR NEGATIVE  KETONESUR 5*  PROTEINUR 100*  NITRITE NEGATIVE  LEUKOCYTESUR SMALL*   Imaging results:  Ct Angio Head W Or Wo Contrast  Result Date: 12/15/2016 CLINICAL DATA:  Follow-up nonhemorrhagic pontine infarct. History of hypertension, lupus, hyperlipidemia and diabetes. EXAM: CT ANGIOGRAPHY HEAD AND NECK TECHNIQUE: Multidetector CT imaging of the head and neck was performed using the standard protocol during bolus administration of intravenous contrast. Multiplanar CT image reconstructions and MIPs were  obtained to evaluate the vascular anatomy. Carotid stenosis measurements (when applicable) are obtained utilizing NASCET criteria, using the distal internal carotid diameter as the denominator. CONTRAST:  50 cc Isovue 370 MRI of the head July 14, 2017 COMPARISON:  None. FINDINGS: CT HEAD FINDINGS BRAIN: No intraparenchymal hemorrhage, mass effect, midline shift. Old small bifrontal than small to moderate RIGHT parietal lobe infarcts. Old bilateral basal ganglia and LEFT cerebellar infarcts. Evolving nonhemorrhagic LEFT parasagittal pontine infarct. Moderate ventriculomegaly on the basis of global parenchymal brain volume loss. No abnormal extra-axial fluid collections. Basal cisterns are patent. VASCULAR: Moderate calcific atherosclerosis of the carotid siphons. SKULL: No skull fracture. Old minimally depressed LEFT nasal bone fracture. No significant scalp soft tissue swelling. SINUSES/ORBITS: Trace ethmoid mucosal thickening. Mastoid air cells are well aerated. Soft tissue effacing the RIGHT and partially opacifying the LEFT ostiomeatal units most compatible with cerumen. The included ocular globes and orbital contents are non-suspicious. OTHER: None. CTA NECK AORTIC ARCH: Normal appearance of the thoracic arch, mild calcific atherosclerosis. Two vessel arch is a normal variant. The origins of the innominate, left Common carotid artery and subclavian artery are widely patent. RIGHT CAROTID SYSTEM: Common carotid artery is widely patent, mild luminal irregularity compatible with atherosclerosis. Normal appearance of the carotid bifurcation without hemodynamically significant stenosis by NASCET criteria. Small, irregular RIGHT internal carotid artery without dissection or focal stenosis. LEFT CAROTID SYSTEM: Common carotid artery is widely patent, mild luminal irregularity compatible with atherosclerosis. Normal appearance of the carotid bifurcation without hemodynamically significant stenosis by NASCET  criteria, mild eccentric calcific atherosclerosis. Mild luminal irregularity of the LEFT cervical internal carotid artery. VERTEBRAL ARTERIES:LEFT vertebral artery occluded within 1 centimeter of the origin due to eccentric intimal thickening. Thready reconstitution distal LEFT V2 segment, occluded at distal LEFT V3 segment. RIGHT vertebral artery is widely patent. Mild extrinsic luminal narrowing due to degenerative cervical spine. SKELETON: No acute osseous process though bone windows have  not been submitted. Poor dentition. Moderate degenerative change of the cervical spine. OTHER NECK: Soft tissues of the neck are non-acute though, not tailored for evaluation. CTA HEAD ANTERIOR CIRCULATION: Patent bilateral internal carotid arteries. Severe stenosis RIGHT para ophthalmic internal carotid artery presumably from atherosclerosis. Occluded RIGHT supraclinoid internal carotid artery. Thready reconstitution of RIGHT carotid terminus and proximal RIGHT M1 segment with attenuated RIGHT needle cerebral artery including severe tandem stenoses RIGHT M1 segment. Diminutive RIGHT A1 segment with widely patent LEFT A1 segment, and anterior communicating artery. Patent bilateral anterior cerebral arteries with moderate luminal irregularity. LEFT middle cerebral artery is patent with moderate luminal irregularity. No contrast extravasation or aneurysm. POSTERIOR CIRCULATION: Occluded LEFT intradural vertebral artery. Moderate luminal irregularity RIGHT V4 segment. Basilar artery is patent. Posterior communicating arteries not present. Moderate luminal irregularity bilateral posterior cerebral arteries. No contrast extravasation or aneurysm. VENOUS SINUSES: Major dural venous sinuses are patent though not tailored for evaluation on this angiographic examination. ANATOMIC VARIANTS: None. DELAYED PHASE: No abnormal intracranial enhancement. IMPRESSION: CT HEAD: Evolving nonhemorrhagic LEFT pontine infarct. Multiple old small  vessel and large vascular territory infarcts. CTA NECK: Occluded LEFT vertebral artery, thready distal V2 reconstitution, occluded at distal LEFT V3 segment. Mild long segment narrowing RIGHT cervical internal carotid artery attributable to atherosclerosis or, fibromuscular dysplasia. CTA HEAD:  Occluded LEFT vertebral artery. Occluded RIGHT supraclinoid internal carotid artery, reconstituted RIGHT carotid terminus with decreased RIGHT middle cerebral artery perfusion. Severely stenotic RIGHT M1 segment. Moderate intracranial atherosclerosis. Electronically Signed   By: Elon Alas M.D.   On: 12/15/2016 17:17   Ct Angio Neck W Or Wo Contrast  Result Date: 12/15/2016 CLINICAL DATA:  Follow-up nonhemorrhagic pontine infarct. History of hypertension, lupus, hyperlipidemia and diabetes. EXAM: CT ANGIOGRAPHY HEAD AND NECK TECHNIQUE: Multidetector CT imaging of the head and neck was performed using the standard protocol during bolus administration of intravenous contrast. Multiplanar CT image reconstructions and MIPs were obtained to evaluate the vascular anatomy. Carotid stenosis measurements (when applicable) are obtained utilizing NASCET criteria, using the distal internal carotid diameter as the denominator. CONTRAST:  50 cc Isovue 370 MRI of the head July 14, 2017 COMPARISON:  None. FINDINGS: CT HEAD FINDINGS BRAIN: No intraparenchymal hemorrhage, mass effect, midline shift. Old small bifrontal than small to moderate RIGHT parietal lobe infarcts. Old bilateral basal ganglia and LEFT cerebellar infarcts. Evolving nonhemorrhagic LEFT parasagittal pontine infarct. Moderate ventriculomegaly on the basis of global parenchymal brain volume loss. No abnormal extra-axial fluid collections. Basal cisterns are patent. VASCULAR: Moderate calcific atherosclerosis of the carotid siphons. SKULL: No skull fracture. Old minimally depressed LEFT nasal bone fracture. No significant scalp soft tissue swelling.  SINUSES/ORBITS: Trace ethmoid mucosal thickening. Mastoid air cells are well aerated. Soft tissue effacing the RIGHT and partially opacifying the LEFT ostiomeatal units most compatible with cerumen. The included ocular globes and orbital contents are non-suspicious. OTHER: None. CTA NECK AORTIC ARCH: Normal appearance of the thoracic arch, mild calcific atherosclerosis. Two vessel arch is a normal variant. The origins of the innominate, left Common carotid artery and subclavian artery are widely patent. RIGHT CAROTID SYSTEM: Common carotid artery is widely patent, mild luminal irregularity compatible with atherosclerosis. Normal appearance of the carotid bifurcation without hemodynamically significant stenosis by NASCET criteria. Small, irregular RIGHT internal carotid artery without dissection or focal stenosis. LEFT CAROTID SYSTEM: Common carotid artery is widely patent, mild luminal irregularity compatible with atherosclerosis. Normal appearance of the carotid bifurcation without hemodynamically significant stenosis by NASCET criteria, mild eccentric calcific atherosclerosis. Mild luminal  irregularity of the LEFT cervical internal carotid artery. VERTEBRAL ARTERIES:LEFT vertebral artery occluded within 1 centimeter of the origin due to eccentric intimal thickening. Thready reconstitution distal LEFT V2 segment, occluded at distal LEFT V3 segment. RIGHT vertebral artery is widely patent. Mild extrinsic luminal narrowing due to degenerative cervical spine. SKELETON: No acute osseous process though bone windows have not been submitted. Poor dentition. Moderate degenerative change of the cervical spine. OTHER NECK: Soft tissues of the neck are non-acute though, not tailored for evaluation. CTA HEAD ANTERIOR CIRCULATION: Patent bilateral internal carotid arteries. Severe stenosis RIGHT para ophthalmic internal carotid artery presumably from atherosclerosis. Occluded RIGHT supraclinoid internal carotid artery. Thready  reconstitution of RIGHT carotid terminus and proximal RIGHT M1 segment with attenuated RIGHT needle cerebral artery including severe tandem stenoses RIGHT M1 segment. Diminutive RIGHT A1 segment with widely patent LEFT A1 segment, and anterior communicating artery. Patent bilateral anterior cerebral arteries with moderate luminal irregularity. LEFT middle cerebral artery is patent with moderate luminal irregularity. No contrast extravasation or aneurysm. POSTERIOR CIRCULATION: Occluded LEFT intradural vertebral artery. Moderate luminal irregularity RIGHT V4 segment. Basilar artery is patent. Posterior communicating arteries not present. Moderate luminal irregularity bilateral posterior cerebral arteries. No contrast extravasation or aneurysm. VENOUS SINUSES: Major dural venous sinuses are patent though not tailored for evaluation on this angiographic examination. ANATOMIC VARIANTS: None. DELAYED PHASE: No abnormal intracranial enhancement. IMPRESSION: CT HEAD: Evolving nonhemorrhagic LEFT pontine infarct. Multiple old small vessel and large vascular territory infarcts. CTA NECK: Occluded LEFT vertebral artery, thready distal V2 reconstitution, occluded at distal LEFT V3 segment. Mild long segment narrowing RIGHT cervical internal carotid artery attributable to atherosclerosis or, fibromuscular dysplasia. CTA HEAD:  Occluded LEFT vertebral artery. Occluded RIGHT supraclinoid internal carotid artery, reconstituted RIGHT carotid terminus with decreased RIGHT middle cerebral artery perfusion. Severely stenotic RIGHT M1 segment. Moderate intracranial atherosclerosis. Electronically Signed   By: Elon Alas M.D.   On: 12/15/2016 17:17   Mr Jeri Cos [id number] Contrast  Result Date: 12/14/2016 CLINICAL DATA:  Initial evaluation for acute speech difficulty, altered mental status, fall. EXAM: MRI HEAD WITHOUT AND WITH CONTRAST TECHNIQUE: Multiplanar, multiecho pulse sequences of the brain and surrounding structures were  obtained without and with intravenous contrast. CONTRAST:  33mL MULTIHANCE GADOBENATE DIMEGLUMINE 529 MG/ML IV SOLN COMPARISON:  Prior CT from 12/13/2016. FINDINGS: Brain: Diffuse prominence of the CSF containing spaces is compatible with generalized age-related cerebral atrophy. Patchy and confluent T2/FLAIR hyperintensity within the periventricular and deep white matter both cerebral hemispheres most compatible chronic microvascular ischemic disease. Multiple scattered areas of encephalomalacia are present within the anterior frontal lobes bilaterally, compatible with remote ischemic infarcts. Additional small remote right parietal and occipital lobe infarcts present as well. Remote lacunar infarcts involve the bilateral basal ganglia. Small amount of chronic hemorrhage noted with the remote left basal ganglia infarct. Scattered remote left cerebellar infarcts present as well. There is abnormal restricted diffusion involving the central and left paramedian aspect of the pons, compatible with acute ischemic infarct (series 4, image 17). This is most predominant at the ventral aspect of the pons. No associated hemorrhage or significant mass effect. No other evidence for acute or subacute ischemia. Gray-white matter differentiation otherwise maintained. No mass lesion, midline shift, or mass effect. No hydrocephalus. No extra-axial fluid collection. Major dural sinuses are grossly patent. No abnormal enhancement. Pituitary gland and suprasellar region within normal limits. Vascular: Abnormal flow void within the distal left vertebral artery, which may be related to slow flow and/ or occlusion (series 7, image  3). This is suspected to be chronic in nature given the remote left cerebellar infarcts. Normal flow voids seen within the remainder the vertebrobasilar system. Intravascular flow voids preserved within the anterior circulation as well. Skull and upper cervical spine: Craniocervical junction within normal  limits. Visualized upper cervical spine unremarkable. Bone marrow signal intensity within normal limits. No scalp soft tissue abnormality. Sinuses/Orbits: Globes and oval soft tissues within normal limits. Mild scattered mucosal thickening within the ethmoidal air cells and maxillary sinuses. Paranasal sinuses are otherwise clear. No mastoid effusion. Inner ear structures grossly normal. Other: No other significant finding. IMPRESSION: 1. Acute ischemic nonhemorrhagic pontine infarct as above. No associated mass effect. 2. Multiple additional remote/chronic ischemic infarcts as detailed above. 3. Generalized age-related cerebral atrophy with moderate chronic microvascular ischemic disease. 4. Abnormal flow void within the distal left vertebral artery, compatible with slow flow and/or occlusion. This is suspected to be chronic in nature given the presence of multiple remote left cerebellar infarcts. Remainder of the intravascular flow voids are maintained. Electronically Signed   By: Jeannine Boga M.D.   On: 12/14/2016 22:15   Dg Swallowing Func-speech Pathology  Result Date: 12/16/2016 Objective Swallowing Evaluation: Type of Study: MBS-Modified Barium Swallow Study Patient Details Name: Blake Wilson MRN: [id number] Date of Birth: 1950-11-23 Today's Date: 12/16/2016 Time: SLP Start Time (ACUTE ONLY): 1030-SLP Stop Time (ACUTE ONLY): 1051 SLP Time Calculation (min) (ACUTE ONLY): 21 min Past Medical History: Past Medical History: Diagnosis Date . CHF (congestive heart failure) ([place])  . Diabetes mellitus  . Hyperlipidemia  . Lupus  Past Surgical History: No past surgical history on file. HPI: Blake Wilson is a 66 y.o. male presenting with weakness/AMS after fall. PMH is significant for CAD, HFrEF, Dilated Cardiomyopathy, HTN, T2DM with neuropathy, Equinus Deformity of Foot, Alcohol Abuse, SLE, Obesity, Glaucoma, and History of Low TSH. CT positive only for chronic areas of infarction including R  frontal, R parietal, L frontal, L external capsule and periventricular white matter, L superior cerebellum and central pons. Found to have UTI. MRI 12/14/16 revealed acute ischemic nonhemorrhagic pontine infarct. Pt reportedly with new stutter, slowed speech. Bedside swallow eval completed 12/15/16--SLP observed explosive coughing and s/s of aspiration. Recommended NPO until instrumental swallow eval (MBS). Subjective: Alert, pleasant, wet vocal quality Assessment / Plan / Recommendation [facility] IP CLINICAL IMPRESSIONS 12/16/2016 Clinical Impression Mr. Nolette exhibited a mild oral and pharyngeal dysphagia. Oral deficits including holding of bolus and delayed transit with thin liquids and barium pill with puree and mild prolonged mastication with regular solids. Pharyngeal phase impairments included penetration to the level of the vocal cords during trials of thin liquids via cup due to incomplete laryngeal closure. Penetration was not sensed by pt and verbal cues to cough/clear throat were ineffective in clearing penetrates. Educated pt re: compensatory strategy to utilize a chin tuck with cup sips of thin liquids. Chin tuck was effective in eliminating penetration during study and will likely reduce risk of penetration/aspiration in future. Observed pt with barium pill with puree; pt required extra bite of puree to aid in transit of barium pill through oral cavity and to clear pill that lodged mid esophagus. Recommend Dys 3 solids (due to missing dentition), thin liquids (chin tuck), and meds whole with puree. Emphasized importance of utilizing chin tuck with thin liquids. ST will f/u for treatment to assess diet tolerance, safety/efficiency of swallow, and compliance with compensatory strategy. SLP Visit Diagnosis Dysphagia, oral phase (R13.11);Dysphagia, pharyngeal phase (R13.13);Dysphagia, pharyngoesophageal phase (R13.14) Attention  and concentration deficit following -- Frontal lobe and executive function deficit  following -- Impact on safety and function Moderate aspiration risk   [facility] IP TREATMENT RECOMMENDATION 12/16/2016 Treatment Recommendations Therapy as outlined in treatment plan below   Prognosis 12/16/2016 Prognosis for Safe Diet Advancement Good Barriers to Reach Goals Cognitive deficits Barriers/Prognosis Comment -- [facility] IP DIET RECOMMENDATION 12/16/2016 SLP Diet Recommendations Dysphagia 3 (Mech soft) solids;Thin liquid Liquid Administration via Cup;No straw;Other (Comment) Medication Administration Whole meds with puree Compensations Slow rate;Small sips/bites;Chin tuck Postural Changes Seated upright at 90 degrees   [facility] IP OTHER RECOMMENDATIONS 12/16/2016 Recommended Consults -- Oral Care Recommendations Oral care BID Other Recommendations --   [facility] IP FOLLOW UP RECOMMENDATIONS 12/16/2016 Follow up Recommendations Skilled Nursing facility   [facility] IP FREQUENCY AND DURATION 12/16/2016 Speech Therapy Frequency (ACUTE ONLY) min 2x/week Treatment Duration 2 weeks      CHL IP ORAL PHASE 12/16/2016 Oral Phase Impaired Oral - Pudding Teaspoon -- Oral - Pudding Cup -- Oral - Honey Teaspoon -- Oral - Honey Cup -- Oral - Nectar Teaspoon -- Oral - Nectar Cup -- Oral - Nectar Straw -- Oral - Thin Teaspoon -- Oral - Thin Cup Delayed oral transit;Holding of bolus Oral - Thin Straw -- Oral - Puree -- Oral - Mech Soft -- Oral - Regular Other (Comment) Oral - Multi-Consistency -- Oral - Pill Holding of bolus;Delayed oral transit Oral Phase - Comment --  CHL IP PHARYNGEAL PHASE 12/16/2016 Pharyngeal Phase Impaired Pharyngeal- Pudding Teaspoon -- Pharyngeal -- Pharyngeal- Pudding Cup -- Pharyngeal -- Pharyngeal- Honey Teaspoon -- Pharyngeal -- Pharyngeal- Honey Cup -- Pharyngeal -- Pharyngeal- Nectar Teaspoon -- Pharyngeal -- Pharyngeal- Nectar Cup -- Pharyngeal -- Pharyngeal- Nectar Straw -- Pharyngeal -- Pharyngeal- Thin Teaspoon -- Pharyngeal -- Pharyngeal- Thin Cup Penetration/Aspiration during swallow;Reduced airway/laryngeal closure  Pharyngeal Material enters airway, remains ABOVE vocal cords then ejected out;Material enters airway, CONTACTS cords and not ejected out Pharyngeal- Thin Straw -- Pharyngeal -- Pharyngeal- Puree -- Pharyngeal -- Pharyngeal- Mechanical Soft -- Pharyngeal -- Pharyngeal- Regular WFL Pharyngeal -- Pharyngeal- Multi-consistency -- Pharyngeal -- Pharyngeal- Pill WFL Pharyngeal -- Pharyngeal Comment --  CHL IP CERVICAL ESOPHAGEAL PHASE 12/16/2016 Cervical Esophageal Phase WFL Pudding Teaspoon -- Pudding Cup -- Honey Teaspoon -- Honey Cup -- Nectar Teaspoon -- Nectar Cup -- Nectar Straw -- Thin Teaspoon -- Thin Cup -- Thin Straw -- Puree -- Mechanical Soft -- Regular -- Multi-consistency -- Pill -- Cervical Esophageal Comment -- No flowsheet data found. Houston Siren 12/16/2016, 12:37 PM [name] Colvin Caroli.Ed CCC-SLP Pager [phone]               Other results: EKG: Q waves in inferior and anteroseptal leads.   Assessment, Plan, & Recommendations by Problem: Active Problems:   Altered mental status   Urinary tract infection with hematuria   Elevated CK   Weakness   Stroke syndrome (HCC)  CAD: No prior cath or stress test results in chart. EKG on admission showing Q waves in inferior and anteroseptal leads. Echo done today showing diffuse hypokinesis. Per cardiology note from 08/2014, patient had refused cardiac cath or MRI to r/o ishemic etiology. No need for emergent cath at this time as patient is not having angina.  -Will continue medical mgmt with Aspirin, Plavix, Coreg, and Imdur  -Order TEE to r/o an apical thrombus or aortic atherosclerosis which could have possibly served as an embolic source for the patient's stroke.   Congestive dilated cardiomyopathy Echo done today demonstrated LVEF 45-50%;  improved from previous echo in 2015 (EF was 25-30% at that time). Patient is euvolemic on exam.  -Continue Coreg 6.25 mg BID -Continue Entresto -HOLD Lasix for now   HTN: SBP ranging in the  [id number] and diastolic in the [id number]. Slowly titrate meds in the setting of acute ischemic stroke.  -Continue Coreg -Continue Imdur  -Continue Entresto   HLD -Continue high intensity statin  Acute ischemic pontine CVA -Neurology following   UTI: being treated with Cipro. -Mgmt per primary    Signed: Shela Leff, MD 12/16/2016, 1:14 PM    The patient has been seen in conjunction with Henreitta Cea, MD. All aspects of care have been considered and discussed. The patient has been personally interviewed, examined, and all clinical data has been reviewed.   Significant dysarthria preventing adequate conversation and history.  Clinical data has been reviewed. Other than aphasia, no significant abnormalities are noted. Exam does not demonstrate significant cardiac abnormalities. An S4 gallop is audible. Prior electrocardiograms including the admission tracing suggests prior inferior and anteroseptal Q wave infarction. LV function is actually improved since the previous echocardiogram with EF increasing to 45% from prior study where EF was less than 30%. Review of prior records demonstrates that an ischemic evaluation has never been performed (patient refused).  In this setting with acute stroke, cardiac ischemic evaluation is not indicated. We would recommend empiric anti-ischemic therapy which would include optimized beta blocker and long-acting nitrate therapy. We also further recommend a transesophageal echo to rule out apical thrombus and aortic atherosclerosis that could have contributed to the patient's stroke since the transthoracic echo was difficult/poor acoustic windows.  Depending upon his clinical course, an ischemic evaluation could be done as an outpatient over the next several months.

## 2016-12-16 NOTE — Care Management Note (Signed)
Case Management Note  Patient Details  Name: Blake Wilson MRN: [id number] Date of Birth: Oct 03, 1951  Subjective/Objective:                  Patient was admitted with a CVA. Lives at home alone, independent at baseline. CM will follow for discharge needs pending PT/OT evals and physician orders.  Action/Plan:   Expected Discharge Date:  12/16/16               Expected Discharge Plan:     In-House Referral:     Discharge planning Services     Post Acute Care Choice:    Choice offered to:     DME Arranged:    DME Agency:     HH Arranged:    HH Agency:     Status of Service:     If discussed at [employer], dates discussed:    Additional Comments:  Rolm Baptise, RN 12/16/2016, 12:00 PM

## 2016-12-16 NOTE — Progress Notes (Signed)
OT Cancellation Note  Patient Details Name: Blake Wilson MRN: [id number] DOB: Aug 03, 1951   Cancelled Treatment:    Reason Eval/Treat Not Completed: Patient at procedure or test/ unavailable. Pt leaving unit for echo. OT will check back as able.  Norman Herrlich, MS OTR/L  Pager: [phone]   Norman Herrlich 12/16/2016, 9:21 AM

## 2016-12-16 NOTE — Progress Notes (Signed)
Family Medicine Teaching Service Daily Progress Note Intern Pager: [phone]  Patient name: Blake Wilson Medical record number: [id number] Date of birth: 1950-11-16 Age: 66 y.o. Gender: male  Primary Care Provider: Luiz Blare, DO Consultants: Neurology Code Status: Full code  Pt Overview and Major Events to Date:  Blake Wilson a 66 y.o.malepresenting with weakness/AMS after fall. PMH is significant for CAD, HFrEF, Dilated Cardiomyopathy, HTN, T2DM with neuropathy, Equinus Deformity of Foot, Alcohol Abuse, SLE, Obesity, Glaucoma, and History of Low TSH.  #Acute Ischemic Pontine CVA:  CT angio neck showed occcluded LEFT vertebral artery, thready distal V2 reconstitution, occluded at distal LEFT V3 segment.  Echo showed diffuse hypokinesis and improved EF to 45-50% from last echo and G1DD. - PT/OT/SLP to assess - Diet held this am, given report of dysphagia.  Will have SLP eval and order diet accordingly.   - c/s Neuro: High dose Lipitor, ok to titrate BP meds for control.  # Dysarthria/ Stutter:  2/2 acute ischemic stroke - speech therapy to evaluate - c/s neuro as above - Blood cultures x 2 NGTD  # UTI:  Asymptomatic, afebrile. UCx w/ Coag neg staph sensitive to Cipro, Clinda, Gent, Nitrofurantoin, Septra. UOP 1.2L, denies dysuria.  - Keflex 2/24-2/25 s/p ceftriaxone.   - Start Cipro 250mg  BID x7 days (2/25>) - Blood culture NGTD  # Weakness: Sensation intact in feet bilaterally. History of diabetic neuropathy but denies taking his prescribed gabapentin. - Thiamine per CIWA protocol - Orthostatic vital signs negative - PT/OT consult  # Elevated CK/Possible Rhabdomyolysis:Suspect 2/2 prolonged immobilization after fall. CK back to baseline and AKI resolved.  - continue PO hydration when completed - Hold lasix for now    # Dilated non-ischemic cardiomyopathy/CHF: Patient part of a research trial and is supposed to be on entresto, coreg, imdur but only reports  being on entresto. Also reports taking lasix 40 mg daily. Could likely benefit ischemic workup, given diffuse hypokinesis.  EF this morning 45-50%, diffuse hypokinesis and G1DD.   - Cardiology consulted for  - Continue entresto BID and restarted IMDUR this morning for elevated BP - Hold lasix for now.  No evidence of fluid overload.      # Diabetes: Last A1c 10.0 on 11/05/16. Reports taking 10 U lantus, though was recommended to increase to 30 U at last Office Visit. Takes metformin 1000 mg BID. Pre-prandial 100.  - Continue Lantus 5 U nightly - Sensitive SSI - Hold home metformin  # History of alcohol abuse:Reports only occasional drinking. Last drink 2/21. Ethanol level neg. - CIWA protocol - scores of 0  # Hx CAD: - Continue home aspirin - CK levels no longer in rhabdo range.  Given CVA, will restart statin.  FEN/GI: Diet held this am, given report of dysphagia.  Will have SLP eval and order diet accordingly. Prophylaxis: Lovenox  Disposition: Pending improvement in mental status, neuro and PT recs  Subjective:  Feeling well this morning. No concerns.   Objective: Temp:  [97.5 F (36.4 C)-98 F (36.7 C)] 97.6 F (36.4 C) (02/26 0521) Pulse Rate:  [54-69] 54 (02/26 0521) Resp:  [20] 20 (02/26 0521) BP: (168-186)/(60-73) 182/66 (02/26 0521) SpO2:  [99 %-100 %] 100 % (02/26 0521) Physical Exam: General: 66yo M sitting up in bed comfortable and in NAD Cardiovascular: RRR, no MRG Respiratory: CTAB, no increased WOB Gastrointestinal: Soft, +BS, distended, no rebound or guarding MSK: no LE edema, 4/5 strength UE and LE w/ RUE seemingly weaker compared to left.  Neuro: AAOx3, Light touch sensation in tact LE and UE.  CN 2-12 grossly in tact. Attempts to answer questions but speech slurred.   Psych: Flat affect, normal mood.   Laboratory:  Recent Labs Lab 12/13/16 1251 12/14/16 0254 12/15/16 0957  WBC 8.8 5.8 4.3  HGB 13.9 12.5* 13.5  HCT 40.0 37.4* 39.5  PLT 355  277 269    Recent Labs Lab 12/14/16 0254 12/15/16 0957 12/16/16 0414  NA 138 136 137  K 3.3* 4.2 3.9  CL 105 105 107  CO2 23 24 21*  BUN 12 7 5*  CREATININE 0.86 0.79 0.79  CALCIUM 8.9 8.9 8.5*  PROT 6.3*  --   --   BILITOT 0.8  --   --   ALKPHOS 67  --   --   ALT 19  --   --   AST 27  --   --   GLUCOSE 132* 161* 101*    Imaging/Diagnostic Tests: Ct Angio Head W Or Wo Contrast  Result Date: 12/15/2016 CLINICAL DATA:  Follow-up nonhemorrhagic pontine infarct. History of hypertension, lupus, hyperlipidemia and diabetes. EXAM: CT ANGIOGRAPHY HEAD AND NECK TECHNIQUE: Multidetector CT imaging of the head and neck was performed using the standard protocol during bolus administration of intravenous contrast. Multiplanar CT image reconstructions and MIPs were obtained to evaluate the vascular anatomy. Carotid stenosis measurements (when applicable) are obtained utilizing NASCET criteria, using the distal internal carotid diameter as the denominator. CONTRAST:  50 cc Isovue 370 MRI of the head July 14, 2017 COMPARISON:  None. FINDINGS: CT HEAD FINDINGS BRAIN: No intraparenchymal hemorrhage, mass effect, midline shift. Old small bifrontal than small to moderate RIGHT parietal lobe infarcts. Old bilateral basal ganglia and LEFT cerebellar infarcts. Evolving nonhemorrhagic LEFT parasagittal pontine infarct. Moderate ventriculomegaly on the basis of global parenchymal brain volume loss. No abnormal extra-axial fluid collections. Basal cisterns are patent. VASCULAR: Moderate calcific atherosclerosis of the carotid siphons. SKULL: No skull fracture. Old minimally depressed LEFT nasal bone fracture. No significant scalp soft tissue swelling. SINUSES/ORBITS: Trace ethmoid mucosal thickening. Mastoid air cells are well aerated. Soft tissue effacing the RIGHT and partially opacifying the LEFT ostiomeatal units most compatible with cerumen. The included ocular globes and orbital contents are  non-suspicious. OTHER: None. CTA NECK AORTIC ARCH: Normal appearance of the thoracic arch, mild calcific atherosclerosis. Two vessel arch is a normal variant. The origins of the innominate, left Common carotid artery and subclavian artery are widely patent. RIGHT CAROTID SYSTEM: Common carotid artery is widely patent, mild luminal irregularity compatible with atherosclerosis. Normal appearance of the carotid bifurcation without hemodynamically significant stenosis by NASCET criteria. Small, irregular RIGHT internal carotid artery without dissection or focal stenosis. LEFT CAROTID SYSTEM: Common carotid artery is widely patent, mild luminal irregularity compatible with atherosclerosis. Normal appearance of the carotid bifurcation without hemodynamically significant stenosis by NASCET criteria, mild eccentric calcific atherosclerosis. Mild luminal irregularity of the LEFT cervical internal carotid artery. VERTEBRAL ARTERIES:LEFT vertebral artery occluded within 1 centimeter of the origin due to eccentric intimal thickening. Thready reconstitution distal LEFT V2 segment, occluded at distal LEFT V3 segment. RIGHT vertebral artery is widely patent. Mild extrinsic luminal narrowing due to degenerative cervical spine. SKELETON: No acute osseous process though bone windows have not been submitted. Poor dentition. Moderate degenerative change of the cervical spine. OTHER NECK: Soft tissues of the neck are non-acute though, not tailored for evaluation. CTA HEAD ANTERIOR CIRCULATION: Patent bilateral internal carotid arteries. Severe stenosis RIGHT para ophthalmic internal carotid artery presumably from  atherosclerosis. Occluded RIGHT supraclinoid internal carotid artery. Thready reconstitution of RIGHT carotid terminus and proximal RIGHT M1 segment with attenuated RIGHT needle cerebral artery including severe tandem stenoses RIGHT M1 segment. Diminutive RIGHT A1 segment with widely patent LEFT A1 segment, and anterior  communicating artery. Patent bilateral anterior cerebral arteries with moderate luminal irregularity. LEFT middle cerebral artery is patent with moderate luminal irregularity. No contrast extravasation or aneurysm. POSTERIOR CIRCULATION: Occluded LEFT intradural vertebral artery. Moderate luminal irregularity RIGHT V4 segment. Basilar artery is patent. Posterior communicating arteries not present. Moderate luminal irregularity bilateral posterior cerebral arteries. No contrast extravasation or aneurysm. VENOUS SINUSES: Major dural venous sinuses are patent though not tailored for evaluation on this angiographic examination. ANATOMIC VARIANTS: None. DELAYED PHASE: No abnormal intracranial enhancement. IMPRESSION: CT HEAD: Evolving nonhemorrhagic LEFT pontine infarct. Multiple old small vessel and large vascular territory infarcts. CTA NECK: Occluded LEFT vertebral artery, thready distal V2 reconstitution, occluded at distal LEFT V3 segment. Mild long segment narrowing RIGHT cervical internal carotid artery attributable to atherosclerosis or, fibromuscular dysplasia. CTA HEAD:  Occluded LEFT vertebral artery. Occluded RIGHT supraclinoid internal carotid artery, reconstituted RIGHT carotid terminus with decreased RIGHT middle cerebral artery perfusion. Severely stenotic RIGHT M1 segment. Moderate intracranial atherosclerosis. Electronically Signed   By: Elon Alas M.D.   On: 12/15/2016 17:17   Ct Angio Neck W Or Wo Contrast  Result Date: 12/15/2016 CLINICAL DATA:  Follow-up nonhemorrhagic pontine infarct. History of hypertension, lupus, hyperlipidemia and diabetes. EXAM: CT ANGIOGRAPHY HEAD AND NECK TECHNIQUE: Multidetector CT imaging of the head and neck was performed using the standard protocol during bolus administration of intravenous contrast. Multiplanar CT image reconstructions and MIPs were obtained to evaluate the vascular anatomy. Carotid stenosis measurements (when applicable) are obtained  utilizing NASCET criteria, using the distal internal carotid diameter as the denominator. CONTRAST:  50 cc Isovue 370 MRI of the head July 14, 2017 COMPARISON:  None. FINDINGS: CT HEAD FINDINGS BRAIN: No intraparenchymal hemorrhage, mass effect, midline shift. Old small bifrontal than small to moderate RIGHT parietal lobe infarcts. Old bilateral basal ganglia and LEFT cerebellar infarcts. Evolving nonhemorrhagic LEFT parasagittal pontine infarct. Moderate ventriculomegaly on the basis of global parenchymal brain volume loss. No abnormal extra-axial fluid collections. Basal cisterns are patent. VASCULAR: Moderate calcific atherosclerosis of the carotid siphons. SKULL: No skull fracture. Old minimally depressed LEFT nasal bone fracture. No significant scalp soft tissue swelling. SINUSES/ORBITS: Trace ethmoid mucosal thickening. Mastoid air cells are well aerated. Soft tissue effacing the RIGHT and partially opacifying the LEFT ostiomeatal units most compatible with cerumen. The included ocular globes and orbital contents are non-suspicious. OTHER: None. CTA NECK AORTIC ARCH: Normal appearance of the thoracic arch, mild calcific atherosclerosis. Two vessel arch is a normal variant. The origins of the innominate, left Common carotid artery and subclavian artery are widely patent. RIGHT CAROTID SYSTEM: Common carotid artery is widely patent, mild luminal irregularity compatible with atherosclerosis. Normal appearance of the carotid bifurcation without hemodynamically significant stenosis by NASCET criteria. Small, irregular RIGHT internal carotid artery without dissection or focal stenosis. LEFT CAROTID SYSTEM: Common carotid artery is widely patent, mild luminal irregularity compatible with atherosclerosis. Normal appearance of the carotid bifurcation without hemodynamically significant stenosis by NASCET criteria, mild eccentric calcific atherosclerosis. Mild luminal irregularity of the LEFT cervical internal  carotid artery. VERTEBRAL ARTERIES:LEFT vertebral artery occluded within 1 centimeter of the origin due to eccentric intimal thickening. Thready reconstitution distal LEFT V2 segment, occluded at distal LEFT V3 segment. RIGHT vertebral artery is widely patent. Mild extrinsic  luminal narrowing due to degenerative cervical spine. SKELETON: No acute osseous process though bone windows have not been submitted. Poor dentition. Moderate degenerative change of the cervical spine. OTHER NECK: Soft tissues of the neck are non-acute though, not tailored for evaluation. CTA HEAD ANTERIOR CIRCULATION: Patent bilateral internal carotid arteries. Severe stenosis RIGHT para ophthalmic internal carotid artery presumably from atherosclerosis. Occluded RIGHT supraclinoid internal carotid artery. Thready reconstitution of RIGHT carotid terminus and proximal RIGHT M1 segment with attenuated RIGHT needle cerebral artery including severe tandem stenoses RIGHT M1 segment. Diminutive RIGHT A1 segment with widely patent LEFT A1 segment, and anterior communicating artery. Patent bilateral anterior cerebral arteries with moderate luminal irregularity. LEFT middle cerebral artery is patent with moderate luminal irregularity. No contrast extravasation or aneurysm. POSTERIOR CIRCULATION: Occluded LEFT intradural vertebral artery. Moderate luminal irregularity RIGHT V4 segment. Basilar artery is patent. Posterior communicating arteries not present. Moderate luminal irregularity bilateral posterior cerebral arteries. No contrast extravasation or aneurysm. VENOUS SINUSES: Major dural venous sinuses are patent though not tailored for evaluation on this angiographic examination. ANATOMIC VARIANTS: None. DELAYED PHASE: No abnormal intracranial enhancement. IMPRESSION: CT HEAD: Evolving nonhemorrhagic LEFT pontine infarct. Multiple old small vessel and large vascular territory infarcts. CTA NECK: Occluded LEFT vertebral artery, thready distal V2  reconstitution, occluded at distal LEFT V3 segment. Mild long segment narrowing RIGHT cervical internal carotid artery attributable to atherosclerosis or, fibromuscular dysplasia. CTA HEAD:  Occluded LEFT vertebral artery. Occluded RIGHT supraclinoid internal carotid artery, reconstituted RIGHT carotid terminus with decreased RIGHT middle cerebral artery perfusion. Severely stenotic RIGHT M1 segment. Moderate intracranial atherosclerosis. Electronically Signed   By: Elon Alas M.D.   On: 12/15/2016 17:17   Mr Jeri Cos [id number] Contrast  Result Date: 12/14/2016 CLINICAL DATA:  Initial evaluation for acute speech difficulty, altered mental status, fall. EXAM: MRI HEAD WITHOUT AND WITH CONTRAST TECHNIQUE: Multiplanar, multiecho pulse sequences of the brain and surrounding structures were obtained without and with intravenous contrast. CONTRAST:  77mL MULTIHANCE GADOBENATE DIMEGLUMINE 529 MG/ML IV SOLN COMPARISON:  Prior CT from 12/13/2016. FINDINGS: Brain: Diffuse prominence of the CSF containing spaces is compatible with generalized age-related cerebral atrophy. Patchy and confluent T2/FLAIR hyperintensity within the periventricular and deep white matter both cerebral hemispheres most compatible chronic microvascular ischemic disease. Multiple scattered areas of encephalomalacia are present within the anterior frontal lobes bilaterally, compatible with remote ischemic infarcts. Additional small remote right parietal and occipital lobe infarcts present as well. Remote lacunar infarcts involve the bilateral basal ganglia. Small amount of chronic hemorrhage noted with the remote left basal ganglia infarct. Scattered remote left cerebellar infarcts present as well. There is abnormal restricted diffusion involving the central and left paramedian aspect of the pons, compatible with acute ischemic infarct (series 4, image 17). This is most predominant at the ventral aspect of the pons. No associated hemorrhage or  significant mass effect. No other evidence for acute or subacute ischemia. Gray-white matter differentiation otherwise maintained. No mass lesion, midline shift, or mass effect. No hydrocephalus. No extra-axial fluid collection. Major dural sinuses are grossly patent. No abnormal enhancement. Pituitary gland and suprasellar region within normal limits. Vascular: Abnormal flow void within the distal left vertebral artery, which may be related to slow flow and/ or occlusion (series 7, image 3). This is suspected to be chronic in nature given the remote left cerebellar infarcts. Normal flow voids seen within the remainder the vertebrobasilar system. Intravascular flow voids preserved within the anterior circulation as well. Skull and upper cervical spine: Craniocervical junction within normal  limits. Visualized upper cervical spine unremarkable. Bone marrow signal intensity within normal limits. No scalp soft tissue abnormality. Sinuses/Orbits: Globes and oval soft tissues within normal limits. Mild scattered mucosal thickening within the ethmoidal air cells and maxillary sinuses. Paranasal sinuses are otherwise clear. No mastoid effusion. Inner ear structures grossly normal. Other: No other significant finding. IMPRESSION: 1. Acute ischemic nonhemorrhagic pontine infarct as above. No associated mass effect. 2. Multiple additional remote/chronic ischemic infarcts as detailed above. 3. Generalized age-related cerebral atrophy with moderate chronic microvascular ischemic disease. 4. Abnormal flow void within the distal left vertebral artery, compatible with slow flow and/or occlusion. This is suspected to be chronic in nature given the presence of multiple remote left cerebellar infarcts. Remainder of the intravascular flow voids are maintained. Electronically Signed   By: Jeannine Boga M.D.   On: 12/14/2016 22:15    Eloise Levels, MD 12/16/2016, 12:26 PM PGY-1, [place] Intern  pager: [phone], text pages welcome

## 2016-12-16 NOTE — Progress Notes (Signed)
STROKE TEAM PROGRESS NOTE   HISTORY OF PRESENT ILLNESS (per record) This is a 66 year old right-handed man who is a somewhat limited historian. He reports that he was in his usual state of health until 12/13/16 when he slipped and fell on some water. He was unable to get up on his own and was brought to the emergency department for further evaluation. He is currently not reporting any particular symptoms. He denies any weakness or numbness. He has not had any vision changes. He denies difficulty swallowing. He does endorse some slurred speech but says this has been going on for a couple of weeks. Additional information is obtained from review of the patient's medical record.  According to the ED notes, the patient actually fell on 12/12/16. He reported he slipped on some wet ground. He was found by management of the facility where he lives. He denied any symptoms on presentation. He was admitted with a diagnosis of possible altered mental status. Workup included MRI scan of the brain which revealed an acute infarction involving the left pons. Neurology consultation is now requested for further recommendations.  Patient was not administered IV t-PA.   SUBJECTIVE (INTERVAL HISTORY) No family at bedside. Pt still has dysarthria. No other neuro deficit at this time. PT/OT recommend SNF.    OBJECTIVE Temp:  [97.5 F (36.4 C)-98 F (36.7 C)] 97.6 F (36.4 C) (02/26 0521) Pulse Rate:  [54-69] 54 (02/26 0521) Cardiac Rhythm: Sinus bradycardia (02/26 0712) Resp:  [20] 20 (02/26 0521) BP: (168-186)/(60-73) 182/66 (02/26 0521) SpO2:  [99 %-100 %] 100 % (02/26 0521)  CBC:  Recent Labs Lab 12/13/16 1251 12/14/16 0254 12/15/16 0957  WBC 8.8 5.8 4.3  NEUTROABS 7.2  --   --   HGB 13.9 12.5* 13.5  HCT 40.0 37.4* 39.5  MCV 89.1 89.5 89.4  PLT 355 277 [id number]    Basic Metabolic Panel:  Recent Labs Lab 12/15/16 0957 12/16/16 0414  NA 136 137  K 4.2 3.9  CL 105 107  CO2 24 21*  GLUCOSE 161*  101*  BUN 7 5*  CREATININE 0.79 0.79  CALCIUM 8.9 8.5*    Lipid Panel:    Component Value Date/Time   CHOL 249 (H) 11/14/2016 0920   TRIG 146 11/14/2016 0920   HDL 42 11/14/2016 0920   CHOLHDL 5.9 (H) 11/14/2016 0920   VLDL 29 11/14/2016 0920   LDLCALC 178 (H) 11/14/2016 0920   HgbA1c:  Lab Results  Component Value Date   HGBA1C 10.0 11/05/2016   Urine Drug Screen:    Component Value Date/Time   LABOPIA NONE DETECTED 12/13/2016 1347   COCAINSCRNUR NONE DETECTED 12/13/2016 1347   LABBENZ NONE DETECTED 12/13/2016 1347   AMPHETMU NONE DETECTED 12/13/2016 1347   THCU NONE DETECTED 12/13/2016 1347   LABBARB NONE DETECTED 12/13/2016 1347      IMAGING I have personally reviewed the radiological images below and agree with the radiology interpretations.  CT HEAD 12/15/2016 Evolving nonhemorrhagic LEFT pontine infarct. Multiple old small vessel and large vascular territory infarcts.   CTA HEAD 12/15/2016 Occluded LEFT vertebral artery. Occluded RIGHT supraclinoid internal carotid artery, reconstituted RIGHT carotid terminus with decreased RIGHT middle cerebral artery perfusion. Severely stenotic RIGHT M1 segment. Moderate intracranial atherosclerosis.   CTA NECK 12/15/2016 Occluded LEFT vertebral artery, thready distal V2 reconstitution, occluded at distal LEFT V3 segment. Mild long segment narrowing RIGHT cervical internal carotid artery attributable to atherosclerosis or, fibromuscular dysplasia.   Mr [name] Contrast 12/14/2016 1. Acute ischemic  nonhemorrhagic pontine infarct as above. No associated mass effect. 2. Multiple additional remote/chronic ischemic infarcts as detailed above. 3. Generalized age-related cerebral atrophy with moderate chronic microvascular ischemic disease. 4. Abnormal flow void within the distal left vertebral artery, compatible with slow flow and/or occlusion. This is suspected to be chronic in nature given the presence of multiple remote left  cerebellar infarcts. Remainder of the intravascular flow voids are maintained.   2D Echocardiogram  - Left ventricle: The cavity size was mildly dilated. Wall thickness was normal. Systolic function was mildly reduced. The estimated ejection fraction was in the range of 45% to 50%. Diffuse hypokinesis. Doppler parameters are consistent with abnormal left ventricular relaxation (grade 1 diastolic dysfunction). - Left atrium: The atrium was mildly dilated. - Atrial septum: No defect or patent foramen ovale was identified.   PHYSICAL EXAM  Temp:  [97.5 F (36.4 C)-98 F (36.7 C)] (P) 97.9 F (36.6 C) (02/26 1347) Pulse Rate:  [54-69] (P) 54 (02/26 1347) Resp:  [18-20] (P) 18 (02/26 1347) BP: (168-186)/(60-73) (P) 183/53 (02/26 1347) SpO2:  [99 %-100 %] (P) 100 % (02/26 1347)  General - Well nourished, well developed, in no apparent distress.  Ophthalmologic - Fundi not visualized due to eye movement.  Cardiovascular - Regular rate and rhythm.  Mental Status -  Level of arousal and orientation to time, place, and person were intact. Language including expression, naming, repetition, comprehension was assessed and found intact, but moderate dysarthria  Cranial Nerves II - XII - II - Visual field intact OU. III, IV, VI - Extraocular movements intact. V - Facial sensation intact bilaterally. VII - Facial movement intact bilaterally. VIII - Hearing & vestibular intact bilaterally. X - Palate elevates symmetrically, moderate dysarthria.Marland Kitchen XI - Chin turning & shoulder shrug intact bilaterally. XII - Tongue protrusion intact.  Motor Strength - The patient's strength was normal in all extremities and pronator drift was absent.  Bulk was normal and fasciculations were absent.   Motor Tone - Muscle tone was assessed at the neck and appendages and was normal.  Reflexes - The patient's reflexes were 1+ in all extremities and he had no pathological reflexes.  Sensory - Light touch,  temperature/pinprick were assessed and were symmetrical.    Coordination - The patient had normal movements in the hands with no ataxia or dysmetria.  Tremor was absent.  Gait and Station - deferred.   ASSESSMENT/PLAN [name] is a 66 y.o. male with history of CAD, CHF, HTN, DM. ETOH abuse, SLE, obesity, Glaucoma and low TSH presenting with slurred speech and weakness. He did not receive IV t-PA due to delay in arrival.   StrokeA:  left pontine infarct secondary to small vessel disease source  Resultant moderate dysarthria  CT head L pontine infarct  CTA head occluded L VA. Occluded R supraclinoid ICA. Severely stenotic R M1.   CTA neck occluded L VA, occluded L V3. Mild long narrowing cervical R ICA  MRI  L pontine infarct. Flow void L VA. 82 old L cerebellar infarcts.  2D Echo  EF 45-50%  LDL 178  HgbA1c 10.0  Lovenox 40 mg sq daily for VTE prophylaxis  Diet NPO time specified Except for: Sips with Meds  aspirin 81 mg daily prior to admission, now on aspirin 325 mg daily and clopidogrel 75 mg daily given large vessel disease. Continue x 3 months then plavix alone.  Patient counseled to be compliant with his antithrombotic medications  Ongoing aggressive stroke risk factor management  Therapy recommendations:  pending   Disposition:  pending   Hypertension  Stable  Permissive hypertension (OK if < 220/120) but gradually normalize in 5-7 days  Long-term BP goal normotensive  Hyperlipidemia  Home meds:  lipitor 80, resumed in hospital  LDL 178, goal < 70  Continue statin at discharge  Diabetes type II Diabetic neuropathy  HgbA1c 10.0, goal < 7.0  Uncontrolled  SSI  CBG under control  Other Stroke Risk Factors  Advanced age  Former Cigarette smoker  ETOH use, advised to drink no more than 2 drink(s) a day. Hx ETOH abuse  Family hx stroke (maternal grandmother)  Dilated cardiomyopathy/CHF  Other Active  Problems  LUPUS  UTI  Possible AMS  Elevated CK, possible Rhabdomylosis  Hospital day # 2  Neurology will sign off. Please call with questions. Pt will follow up with Cecille Rubin at [facility] in about 6 weeks. Thanks for the consult.  Rosalin Hawking, MD PhD Stroke Neurology 12/16/2016 4:18 PM  To contact Stroke Continuity provider, please refer to [email or web address] After hours, contact General Neurology

## 2016-12-16 NOTE — Progress Notes (Signed)
Occupational Therapy Treatment Patient Details Name: Blake Wilson MRN: [id number] DOB: Feb 09, 1951 Today's Date: 12/16/2016    History of present illness Blake Wilson is a 66 y.o. male presenting with weakness/AMS after fall. PMH is significant for CAD, HFrEF, Dilated Cardiomyopathy, HTN, T2DM with neuropathy, Equinus Deformity of Foot, Alcohol Abuse, SLE, Obesity, Glaucoma, and History of Low TSH.  CT positive only for chronic areas of infarction including R frontal, R parietal, L frontal, L external capsule and periventricular white matter, L superior cerebellum and central pons.  Found to have UTI.   OT comments  Pt progressing toward OT goals. He demonstrated improved orientation with verbal cueing this session. He was able to complete toilet transfers with min guard to min assist and toileting hygeine with min guard assist. He continues to demonstrate intellectual awareness and poor problem solving skills as related to ADL tasks. However, he was able to follow follow multi-step commands with increased time this session. D/C recommendation remains appropriate. Will continue to follow acutely.   Follow Up Recommendations  SNF;Supervision/Assistance - 24 hour    Equipment Recommendations  Other (comment) (TBD)    Recommendations for Other Services      Precautions / Restrictions Precautions Precautions: Fall Restrictions Weight Bearing Restrictions: No       Mobility Bed Mobility Overal bed mobility: Modified Independent                Transfers Overall transfer level: Needs assistance Equipment used: Rolling walker (2 wheeled) Transfers: Sit to/from Stand Sit to Stand: Min assist         General transfer comment: increased time, lifting assist    Balance Overall balance assessment: Needs assistance Sitting-balance support: No upper extremity supported;Feet unsupported Sitting balance-Leahy Scale: Fair     Standing balance support: Bilateral upper  extremity supported Standing balance-Leahy Scale: Poor Standing balance comment: Reliant on UE support during dynamic standing tasks.                   ADL Overall ADL's : Needs assistance/impaired     Grooming: Min guard;Standing;Cueing for safety;Cueing for sequencing           Upper Body Dressing : Supervision/safety;Sitting;Cueing for safety;Cueing for sequencing       Toilet Transfer: Minimal assistance;Ambulation;Cueing for safety;Cueing for sequencing;RW   Toileting- Clothing Manipulation and Hygiene: Min guard;Cueing for sequencing;Cueing for safety;Sit to/from stand Toileting - Clothing Manipulation Details (indicate cue type and reason): Pt laying in stool on OT arrival and pt unaware.     Functional mobility during ADLs: Rolling walker;Min guard;Minimal assistance;Cueing for safety;Cueing for sequencing General ADL Comments: Min guard to min assist for functional mobiltiy for safety.      Vision                     Perception     Praxis      Cognition   Behavior During Therapy: Flat affect Overall Cognitive Status: Impaired/Different from baseline Area of Impairment: Orientation;Attention;Following commands;Safety/judgement;Problem solving;Awareness Orientation Level: Disoriented to;Situation;Time Current Attention Level: Selective    Following Commands: Follows multi-step commands consistently;Follows multi-step commands with increased time Safety/Judgement: Decreased awareness of deficits;Decreased awareness of safety Awareness: Intellectual Problem Solving: Slow processing;Decreased initiation;Requires verbal cues General Comments: Able to remember (after ~3 minutes) and follow 3 step ADL task this session. Unaware that he had bowel movement in bed and was sitting in stool. Improved ability to answer orientation questions with verbal cueing this session.  Exercises     Shoulder Instructions       General Comments       Pertinent Vitals/ Pain       Pain Assessment: No/denies pain  Home Living                                          Prior Functioning/Environment              Frequency  Min 2X/week        Progress Toward Goals  OT Goals(current goals can now be found in the care plan section)  Progress towards OT goals: Progressing toward goals  Acute Rehab OT Goals Patient Stated Goal: To get back to independent OT Goal Formulation: With patient Time For Goal Achievement: 12/28/16 Potential to Achieve Goals: Good ADL Goals Pt Will Perform Grooming: with modified independence;standing Pt Will Transfer to Toilet: with modified independence;ambulating;bedside commode (BSC over toilet) Pt Will Perform Toileting - Clothing Manipulation and hygiene: with modified independence;sit to/from stand Additional ADL Goal #1: Pt will demonstrate anticipatory awareness during multi-step ADL tasks in minimally distracting environment with no verbal cues.  Plan Discharge plan remains appropriate    Co-evaluation                 End of Session Equipment Utilized During Treatment: Gait belt;Rolling walker  OT Visit Diagnosis: Muscle weakness (generalized) (M62.81);Unsteadiness on feet (R26.81)   Activity Tolerance Patient tolerated treatment well   Patient Left in chair;with call bell/phone within reach;with chair alarm set;with nursing/sitter in room   Nurse Communication Mobility status        Time: [id number] OT Time Calculation (min): 21 min  Charges: OT General Charges $OT Visit: 1 Procedure OT Treatments $Self Care/Home Management : 8-22 mins  Norman Herrlich, MS OTR/L  Pager: Carson A Khalif Stender 12/16/2016, 5:33 PM

## 2016-12-16 NOTE — Progress Notes (Signed)
    [facility] has been requested to perform a transesophageal echocardiogram on Blake Wilson for cerebrovascular accident.  After careful review of history and examination, the risks and benefits of transesophageal echocardiogram have been explained including risks of esophageal damage, perforation (1:10,000 risk), bleeding, pharyngeal hematoma as well as other potential complications associated with conscious sedation including aspiration, arrhythmia, respiratory failure and death. Alternatives to treatment were discussed, questions were answered. Patient is willing to proceed.   BP stable with no requirement foe pressors. Hgb stable at 13.5, platelets 269.  Erma Heritage, [place]  12/16/2016 3:56 PM

## 2016-12-16 NOTE — Progress Notes (Signed)
Physical Therapy Treatment Patient Details Name: Blake Wilson MRN: [id number] DOB: November 11, 1950 Today's Date: 12/16/2016    History of Present Illness Blake Wilson is a 66 y.o. male presenting with weakness/AMS after fall. PMH is significant for CAD, HFrEF, Dilated Cardiomyopathy, HTN, T2DM with neuropathy, Equinus Deformity of Foot, Alcohol Abuse, SLE, Obesity, Glaucoma, and History of Low TSH.  CT positive only for chronic areas of infarction including R frontal, R parietal, L frontal, L external capsule and periventricular white matter, L superior cerebellum and central pons.  Found to have UTI.    PT Comments    Patient progressing with gait, but still with significant imbalance when first standing.  Was able to state one deficit in regards to his speech, but still unaware of having any strokes previously.  Continue to feel he will need 24 hour care at d/c in SNF level rehab setting.    Follow Up Recommendations  SNF     Equipment Recommendations  Rolling walker with 5" wheels    Recommendations for Other Services       Precautions / Restrictions Precautions Precautions: Fall    Mobility  Bed Mobility Overal bed mobility: Modified Independent                Transfers Overall transfer level: Needs assistance Equipment used: Rolling walker (2 wheeled) Transfers: Sit to/from Stand Sit to Stand: Mod assist         General transfer comment: increased time, lifting assist  Ambulation/Gait Ambulation/Gait assistance: Min guard;Supervision Ambulation Distance (Feet): 80 Feet (x 2) Assistive device: Rolling walker (2 wheeled) Gait Pattern/deviations: Step-through pattern;Decreased stride length;Trunk flexed;Wide base of support     General Gait Details: cues to correct posture, but pt remains flexed, increased time and wide turns running into wall on other side of hallway, initial assist for balance even with walker due to posterior LOB   Stairs             Wheelchair Mobility    Modified Rankin (Stroke Patients Only) Modified Rankin (Stroke Patients Only) Pre-Morbid Rankin Score: Slight disability Modified Rankin: Moderately severe disability     Balance Overall balance assessment: Needs assistance Sitting-balance support: No upper extremity supported;Feet unsupported Sitting balance-Leahy Scale: Fair   Postural control: Posterior lean Standing balance support: Bilateral upper extremity supported Standing balance-Leahy Scale: Poor Standing balance comment: posterior lean initially mod support for balance with UE support                    Cognition Arousal/Alertness: Awake/alert Behavior During Therapy: Flat affect Overall Cognitive Status: Impaired/Different from baseline Area of Impairment: Orientation;Attention;Following commands;Safety/judgement;Problem solving;Awareness Orientation Level: Disoriented to;Situation;Time Current Attention Level: Selective   Following Commands: Follows multi-step commands consistently;Follows multi-step commands with increased time Safety/Judgement: Decreased awareness of deficits;Decreased awareness of safety   Problem Solving: Slow processing;Decreased initiation;Requires verbal cues      Exercises      General Comments General comments (skin integrity, edema, etc.): pt states unable to read signs in hallway, needed mod questioning cues to find his room, but did recall room number when looking at it; reviewed stroke warning signs and risk factors & safe swallow strategies with pt; acknowledges verbally difficutly with speech      Pertinent Vitals/Pain Pain Assessment: No/denies pain    Home Living                      Prior Function  PT Goals (current goals can now be found in the care plan section) Progress towards PT goals: Progressing toward goals    Frequency    Min 3X/week      PT Plan Current plan remains appropriate     Co-evaluation             End of Session Equipment Utilized During Treatment: Gait belt Activity Tolerance: Patient tolerated treatment well Patient left: in chair;with call bell/phone within reach;with chair alarm set         Time: [id number] PT Time Calculation (min) (ACUTE ONLY): 24 min  Charges:  $Gait Training: 8-22 mins $Self Care/Home Management: 8-22                    G Codes:       Reginia Naas 2017/01/07, 1:29 PM  Magda Kiel, [place] 2017/01/07

## 2016-12-16 NOTE — Progress Notes (Signed)
Modified Barium Swallow Progress Note  Patient Details  Name: Blake Wilson MRN: [id number] Date of Birth: [birth year]  Today's Date: 12/16/2016  Modified Barium Swallow completed.  Full report located under Chart Review in the Imaging Section.  Brief recommendations include the following:  Clinical Impression  Mr. Peloso exhibited a mild oral and pharyngeal dysphagia. Oral deficits including holding of bolus and delayed transit with thin liquids and barium pill with puree and mild prolonged mastication with regular solids. Pharyngeal phase impairments included penetration to the level of the vocal cords during trials of thin liquids via cup due to incomplete laryngeal closure. Penetration was not sensed by pt and verbal cues to cough/clear throat were ineffective in clearing penetrates. Educated pt re: compensatory strategy to utilize a chin tuck with cup sips of thin liquids. Chin tuck was effective in eliminating penetration during study and will likely reduce risk of penetration/aspiration in future. Observed pt with barium pill with puree; pt required extra bite of puree to aid in transit of barium pill through oral cavity and to clear pill that lodged mid esophagus. Recommend Dys 3 solids (due to missing dentition), thin liquids (chin tuck), and meds whole with puree. Emphasized importance of utilizing chin tuck with thin liquids. ST will f/u for treatment to assess diet tolerance, safety/efficiency of swallow, and compliance with compensatory strategy.   Swallow Evaluation Recommendations       SLP Diet Recommendations: Dysphagia 3 (Mech soft) solids;Thin liquid   Liquid Administration via: Cup;No straw;Other (Comment) (CHIN TUCK WITH LIQUIDS)   Medication Administration: Whole meds with puree   Supervision: Patient able to self feed;Full supervision/cueing for compensatory strategies   Compensations: Slow rate;Small sips/bites;Chin tuck (CHIN TUCK WITH LIQUIDS)   Postural  Changes: Seated upright at 90 degrees   Oral Care Recommendations: Oral care BID        Blake Wilson 12/16/2016,12:38 PM   Blake Wilson.Ed [employer] [phone]

## 2016-12-17 ENCOUNTER — Inpatient Hospital Stay (HOSPITAL_COMMUNITY): Payer: PPO

## 2016-12-17 ENCOUNTER — Encounter (HOSPITAL_COMMUNITY)
Admission: EM | Disposition: A | Discharge status: Skilled Nursing Facility | Payer: Self-pay | Source: Home / Self Care | Attending: Family Medicine

## 2016-12-17 ENCOUNTER — Encounter (HOSPITAL_COMMUNITY): Payer: Self-pay | Admitting: *Deleted

## 2016-12-17 DIAGNOSIS — I63019 Cerebral infarction due to thrombosis of unspecified vertebral artery: Secondary | ICD-10-CM

## 2016-12-17 DIAGNOSIS — I63013 Cerebral infarction due to thrombosis of bilateral vertebral arteries: Secondary | ICD-10-CM

## 2016-12-17 DIAGNOSIS — R404 Transient alteration of awareness: Secondary | ICD-10-CM

## 2016-12-17 DIAGNOSIS — I340 Nonrheumatic mitral (valve) insufficiency: Secondary | ICD-10-CM

## 2016-12-17 HISTORY — PX: TEE WITHOUT CARDIOVERSION: SHX5443

## 2016-12-17 LAB — GLUCOSE, CAPILLARY
GLUCOSE-CAPILLARY: 118 mg/dL — AB (ref 65–99)
GLUCOSE-CAPILLARY: 120 mg/dL — AB (ref 65–99)
GLUCOSE-CAPILLARY: 110 mg/dL — AB (ref 65–99)
Glucose-Capillary: 101 mg/dL — ABNORMAL HIGH (ref 65–99)
Glucose-Capillary: 128 mg/dL — ABNORMAL HIGH (ref 65–99)

## 2016-12-17 SURGERY — ECHOCARDIOGRAM, TRANSESOPHAGEAL
Anesthesia: Moderate Sedation

## 2016-12-17 MED ORDER — BUTAMBEN-TETRACAINE-BENZOCAINE 2-2-14 % EX AERO
INHALATION_SPRAY | CUTANEOUS | Status: DC | PRN
  Administered 2016-12-17: 2 via TOPICAL

## 2016-12-17 MED ORDER — SODIUM CHLORIDE 0.9 % IV SOLN: INTRAVENOUS | Status: DC

## 2016-12-17 MED ORDER — FENTANYL CITRATE (PF) 100 MCG/2ML IJ SOLN
INTRAMUSCULAR | Status: AC
  Filled 2016-12-17: qty 2

## 2016-12-17 MED ORDER — MIDAZOLAM HCL 5 MG/ML IJ SOLN
INTRAMUSCULAR | Status: AC
  Filled 2016-12-17: qty 2

## 2016-12-17 MED ORDER — FENTANYL CITRATE (PF) 100 MCG/2ML IJ SOLN
INTRAMUSCULAR | Status: DC | PRN
  Administered 2016-12-17 (×2): 25 ug via INTRAVENOUS

## 2016-12-17 MED ORDER — MIDAZOLAM HCL 10 MG/2ML IJ SOLN
INTRAMUSCULAR | Status: DC | PRN
  Administered 2016-12-17: 1 mg via INTRAVENOUS
  Administered 2016-12-17 (×3): 2 mg via INTRAVENOUS

## 2016-12-17 MED ORDER — FUROSEMIDE 40 MG PO TABS
40.0000 mg | ORAL_TABLET | Freq: Every day | ORAL | Status: DC
  Administered 2016-12-17 – 2016-12-18 (×2): 40 mg via ORAL
  Filled 2016-12-17 (×2): qty 1

## 2016-12-17 NOTE — H&P (View-Only) (Signed)
Progress Note  Patient Name: Blake Wilson Date of Encounter: 12/17/2016  Primary Cardiologist: Johnsie Cancel  Subjective   Asymptomatic with reference to cardiac complaints. Aphasia prevents adequate history.  Inpatient Medications    Scheduled Meds: . aspirin EC  325 mg Oral Daily  . atorvastatin  80 mg Oral QHS  . carvedilol  6.25 mg Oral BID  . ciprofloxacin  250 mg Oral BID  . clopidogrel  75 mg Oral Daily  . enoxaparin (LOVENOX) injection  40 mg Subcutaneous Q24H  . folic acid  1 mg Oral Daily  . insulin aspart  0-9 Units Subcutaneous TID WC  . insulin glargine  5 Units Subcutaneous QHS  . isosorbide mononitrate  30 mg Oral Daily  . multivitamin with minerals  1 tablet Oral Daily  . sacubitril-valsartan  1 tablet Oral BID  . thiamine  100 mg Oral Daily  . travoprost (benzalkonium)  1 drop Both Eyes QHS   Continuous Infusions:  PRN Meds:    Vital Signs    Vitals:   12/16/16 2100 12/17/16 0112 12/17/16 0517 12/17/16 0909  BP: (!) 154/53 (!) 151/50 (!) 168/68 (!) 166/58  Pulse: 60 (!) 52 (!) 51 (!) 51  Resp: 18 18 20 20   Temp: 98.1 F (36.7 C) 97.8 F (36.6 C) 97.8 F (36.6 C) 97.5 F (36.4 C)  TempSrc: Oral Oral Oral Oral  SpO2: 99% 100% 99% 99%  Weight:      Height:        Intake/Output Summary (Last 24 hours) at 12/17/16 1108 Last data filed at 12/17/16 0115  Gross per 24 hour  Intake              480 ml  Output             2700 ml  Net            -2220 ml   Filed Weights   12/16/16 1600  Weight: 218 lb 9.6 oz (99.2 kg)    Telemetry    No telemetry available - Personally Reviewed  ECG    Sinus rhythm with inferior and septal Q waves. No change compared to prior tracings. Date of study 12/13/16 - Personally Reviewed  Physical Exam  Lying completely flat. No dyspnea. Patient is comfortable. GEN: No acute distress.   Neck: No JVD Cardiac: RRR, no murmurs, rubs. An S4 gallop is audible. Respiratory: Clear to auscultation bilaterally. GI:  Soft, nontender, non-distended  MS: No edema; No deformity. Neuro:  Nonfocal  Psych: Normal affect   Labs    Chemistry Recent Labs Lab 12/14/16 0254 12/15/16 0957 12/16/16 0414  NA 138 136 137  K 3.3* 4.2 3.9  CL 105 105 107  CO2 23 24 21*  GLUCOSE 132* 161* 101*  BUN 12 7 5*  CREATININE 0.86 0.79 0.79  CALCIUM 8.9 8.9 8.5*  PROT 6.3*  --   --   ALBUMIN 3.1*  --   --   AST 27  --   --   ALT 19  --   --   ALKPHOS 67  --   --   BILITOT 0.8  --   --   GFRNONAA >60 >60 >60  GFRAA >60 >60 >60  ANIONGAP 10 7 9      Hematology Recent Labs Lab 12/13/16 1251 12/14/16 0254 12/15/16 0957  WBC 8.8 5.8 4.3  RBC 4.49 4.18* 4.42  HGB 13.9 12.5* 13.5  HCT 40.0 37.4* 39.5  MCV 89.1 89.5 89.4  MCH  31.0 29.9 30.5  MCHC 34.8 33.4 34.2  RDW 13.6 13.7 13.5  PLT 355 277 269    Cardiac EnzymesNo results for input(s): TROPONINI in the last 168 hours. No results for input(s): TROPIPOC in the last 168 hours.   BNPNo results for input(s): BNP, PROBNP in the last 168 hours.   DDimer No results for input(s): DDIMER in the last 168 hours.   Radiology    Ct Angio Head W Or Wo Contrast  Result Date: 12/15/2016 CLINICAL DATA:  Follow-up nonhemorrhagic pontine infarct. History of hypertension, lupus, hyperlipidemia and diabetes. EXAM: CT ANGIOGRAPHY HEAD AND NECK TECHNIQUE: Multidetector CT imaging of the head and neck was performed using the standard protocol during bolus administration of intravenous contrast. Multiplanar CT image reconstructions and MIPs were obtained to evaluate the vascular anatomy. Carotid stenosis measurements (when applicable) are obtained utilizing NASCET criteria, using the distal internal carotid diameter as the denominator. CONTRAST:  50 cc Isovue 370 MRI of the head July 14, 2017 COMPARISON:  None. FINDINGS: CT HEAD FINDINGS BRAIN: No intraparenchymal hemorrhage, mass effect, midline shift. Old small bifrontal than small to moderate RIGHT parietal lobe  infarcts. Old bilateral basal ganglia and LEFT cerebellar infarcts. Evolving nonhemorrhagic LEFT parasagittal pontine infarct. Moderate ventriculomegaly on the basis of global parenchymal brain volume loss. No abnormal extra-axial fluid collections. Basal cisterns are patent. VASCULAR: Moderate calcific atherosclerosis of the carotid siphons. SKULL: No skull fracture. Old minimally depressed LEFT nasal bone fracture. No significant scalp soft tissue swelling. SINUSES/ORBITS: Trace ethmoid mucosal thickening. Mastoid air cells are well aerated. Soft tissue effacing the RIGHT and partially opacifying the LEFT ostiomeatal units most compatible with cerumen. The included ocular globes and orbital contents are non-suspicious. OTHER: None. CTA NECK AORTIC ARCH: Normal appearance of the thoracic arch, mild calcific atherosclerosis. Two vessel arch is a normal variant. The origins of the innominate, left Common carotid artery and subclavian artery are widely patent. RIGHT CAROTID SYSTEM: Common carotid artery is widely patent, mild luminal irregularity compatible with atherosclerosis. Normal appearance of the carotid bifurcation without hemodynamically significant stenosis by NASCET criteria. Small, irregular RIGHT internal carotid artery without dissection or focal stenosis. LEFT CAROTID SYSTEM: Common carotid artery is widely patent, mild luminal irregularity compatible with atherosclerosis. Normal appearance of the carotid bifurcation without hemodynamically significant stenosis by NASCET criteria, mild eccentric calcific atherosclerosis. Mild luminal irregularity of the LEFT cervical internal carotid artery. VERTEBRAL ARTERIES:LEFT vertebral artery occluded within 1 centimeter of the origin due to eccentric intimal thickening. Thready reconstitution distal LEFT V2 segment, occluded at distal LEFT V3 segment. RIGHT vertebral artery is widely patent. Mild extrinsic luminal narrowing due to degenerative cervical spine.  SKELETON: No acute osseous process though bone windows have not been submitted. Poor dentition. Moderate degenerative change of the cervical spine. OTHER NECK: Soft tissues of the neck are non-acute though, not tailored for evaluation. CTA HEAD ANTERIOR CIRCULATION: Patent bilateral internal carotid arteries. Severe stenosis RIGHT para ophthalmic internal carotid artery presumably from atherosclerosis. Occluded RIGHT supraclinoid internal carotid artery. Thready reconstitution of RIGHT carotid terminus and proximal RIGHT M1 segment with attenuated RIGHT needle cerebral artery including severe tandem stenoses RIGHT M1 segment. Diminutive RIGHT A1 segment with widely patent LEFT A1 segment, and anterior communicating artery. Patent bilateral anterior cerebral arteries with moderate luminal irregularity. LEFT middle cerebral artery is patent with moderate luminal irregularity. No contrast extravasation or aneurysm. POSTERIOR CIRCULATION: Occluded LEFT intradural vertebral artery. Moderate luminal irregularity RIGHT V4 segment. Basilar artery is patent. Posterior communicating arteries not present.  Moderate luminal irregularity bilateral posterior cerebral arteries. No contrast extravasation or aneurysm. VENOUS SINUSES: Major dural venous sinuses are patent though not tailored for evaluation on this angiographic examination. ANATOMIC VARIANTS: None. DELAYED PHASE: No abnormal intracranial enhancement. IMPRESSION: CT HEAD: Evolving nonhemorrhagic LEFT pontine infarct. Multiple old small vessel and large vascular territory infarcts. CTA NECK: Occluded LEFT vertebral artery, thready distal V2 reconstitution, occluded at distal LEFT V3 segment. Mild long segment narrowing RIGHT cervical internal carotid artery attributable to atherosclerosis or, fibromuscular dysplasia. CTA HEAD:  Occluded LEFT vertebral artery. Occluded RIGHT supraclinoid internal carotid artery, reconstituted RIGHT carotid terminus with decreased RIGHT  middle cerebral artery perfusion. Severely stenotic RIGHT M1 segment. Moderate intracranial atherosclerosis. Electronically Signed   By: Elon Alas M.D.   On: 12/15/2016 17:17   Ct Angio Neck W Or Wo Contrast  Result Date: 12/15/2016 CLINICAL DATA:  Follow-up nonhemorrhagic pontine infarct. History of hypertension, lupus, hyperlipidemia and diabetes. EXAM: CT ANGIOGRAPHY HEAD AND NECK TECHNIQUE: Multidetector CT imaging of the head and neck was performed using the standard protocol during bolus administration of intravenous contrast. Multiplanar CT image reconstructions and MIPs were obtained to evaluate the vascular anatomy. Carotid stenosis measurements (when applicable) are obtained utilizing NASCET criteria, using the distal internal carotid diameter as the denominator. CONTRAST:  50 cc Isovue 370 MRI of the head July 14, 2017 COMPARISON:  None. FINDINGS: CT HEAD FINDINGS BRAIN: No intraparenchymal hemorrhage, mass effect, midline shift. Old small bifrontal than small to moderate RIGHT parietal lobe infarcts. Old bilateral basal ganglia and LEFT cerebellar infarcts. Evolving nonhemorrhagic LEFT parasagittal pontine infarct. Moderate ventriculomegaly on the basis of global parenchymal brain volume loss. No abnormal extra-axial fluid collections. Basal cisterns are patent. VASCULAR: Moderate calcific atherosclerosis of the carotid siphons. SKULL: No skull fracture. Old minimally depressed LEFT nasal bone fracture. No significant scalp soft tissue swelling. SINUSES/ORBITS: Trace ethmoid mucosal thickening. Mastoid air cells are well aerated. Soft tissue effacing the RIGHT and partially opacifying the LEFT ostiomeatal units most compatible with cerumen. The included ocular globes and orbital contents are non-suspicious. OTHER: None. CTA NECK AORTIC ARCH: Normal appearance of the thoracic arch, mild calcific atherosclerosis. Two vessel arch is a normal variant. The origins of the innominate, left  Common carotid artery and subclavian artery are widely patent. RIGHT CAROTID SYSTEM: Common carotid artery is widely patent, mild luminal irregularity compatible with atherosclerosis. Normal appearance of the carotid bifurcation without hemodynamically significant stenosis by NASCET criteria. Small, irregular RIGHT internal carotid artery without dissection or focal stenosis. LEFT CAROTID SYSTEM: Common carotid artery is widely patent, mild luminal irregularity compatible with atherosclerosis. Normal appearance of the carotid bifurcation without hemodynamically significant stenosis by NASCET criteria, mild eccentric calcific atherosclerosis. Mild luminal irregularity of the LEFT cervical internal carotid artery. VERTEBRAL ARTERIES:LEFT vertebral artery occluded within 1 centimeter of the origin due to eccentric intimal thickening. Thready reconstitution distal LEFT V2 segment, occluded at distal LEFT V3 segment. RIGHT vertebral artery is widely patent. Mild extrinsic luminal narrowing due to degenerative cervical spine. SKELETON: No acute osseous process though bone windows have not been submitted. Poor dentition. Moderate degenerative change of the cervical spine. OTHER NECK: Soft tissues of the neck are non-acute though, not tailored for evaluation. CTA HEAD ANTERIOR CIRCULATION: Patent bilateral internal carotid arteries. Severe stenosis RIGHT para ophthalmic internal carotid artery presumably from atherosclerosis. Occluded RIGHT supraclinoid internal carotid artery. Thready reconstitution of RIGHT carotid terminus and proximal RIGHT M1 segment with attenuated RIGHT needle cerebral artery including severe tandem stenoses RIGHT M1 segment.  Diminutive RIGHT A1 segment with widely patent LEFT A1 segment, and anterior communicating artery. Patent bilateral anterior cerebral arteries with moderate luminal irregularity. LEFT middle cerebral artery is patent with moderate luminal irregularity. No contrast extravasation  or aneurysm. POSTERIOR CIRCULATION: Occluded LEFT intradural vertebral artery. Moderate luminal irregularity RIGHT V4 segment. Basilar artery is patent. Posterior communicating arteries not present. Moderate luminal irregularity bilateral posterior cerebral arteries. No contrast extravasation or aneurysm. VENOUS SINUSES: Major dural venous sinuses are patent though not tailored for evaluation on this angiographic examination. ANATOMIC VARIANTS: None. DELAYED PHASE: No abnormal intracranial enhancement. IMPRESSION: CT HEAD: Evolving nonhemorrhagic LEFT pontine infarct. Multiple old small vessel and large vascular territory infarcts. CTA NECK: Occluded LEFT vertebral artery, thready distal V2 reconstitution, occluded at distal LEFT V3 segment. Mild long segment narrowing RIGHT cervical internal carotid artery attributable to atherosclerosis or, fibromuscular dysplasia. CTA HEAD:  Occluded LEFT vertebral artery. Occluded RIGHT supraclinoid internal carotid artery, reconstituted RIGHT carotid terminus with decreased RIGHT middle cerebral artery perfusion. Severely stenotic RIGHT M1 segment. Moderate intracranial atherosclerosis. Electronically Signed   By: Elon Alas M.D.   On: 12/15/2016 17:17   Dg Swallowing Func-speech Pathology  Result Date: 12/16/2016 Objective Swallowing Evaluation: Type of Study: MBS-Modified Barium Swallow Study Patient Details Name: DAMIERE BURANDT MRN: [id number] Date of Birth: [birth year] Today's Date: 12/16/2016 Time: SLP Start Time (ACUTE ONLY): 1030-SLP Stop Time (ACUTE ONLY): 1051 SLP Time Calculation (min) (ACUTE ONLY): 21 min Past Medical History: Past Medical History: Diagnosis Date . CHF (congestive heart failure) ([place])  . Diabetes mellitus  . Hyperlipidemia  . Lupus  Past Surgical History: No past surgical history on file. HPI: LORIMER TAKASHIMA is a 66 y.o. male presenting with weakness/AMS after fall. PMH is significant for CAD, HFrEF, Dilated Cardiomyopathy, HTN, T2DM with  neuropathy, Equinus Deformity of Foot, Alcohol Abuse, SLE, Obesity, Glaucoma, and History of Low TSH. CT positive only for chronic areas of infarction including R frontal, R parietal, L frontal, L external capsule and periventricular white matter, L superior cerebellum and central pons. Found to have UTI. MRI 12/14/16 revealed acute ischemic nonhemorrhagic pontine infarct. Pt reportedly with new stutter, slowed speech. Bedside swallow eval completed 12/15/16--SLP observed explosive coughing and s/s of aspiration. Recommended NPO until instrumental swallow eval (MBS). Subjective: Alert, pleasant, wet vocal quality Assessment / Plan / Recommendation [facility] IP CLINICAL IMPRESSIONS 12/16/2016 Clinical Impression Mr. Devall exhibited a mild oral and pharyngeal dysphagia. Oral deficits including holding of bolus and delayed transit with thin liquids and barium pill with puree and mild prolonged mastication with regular solids. Pharyngeal phase impairments included penetration to the level of the vocal cords during trials of thin liquids via cup due to incomplete laryngeal closure. Penetration was not sensed by pt and verbal cues to cough/clear throat were ineffective in clearing penetrates. Educated pt re: compensatory strategy to utilize a chin tuck with cup sips of thin liquids. Chin tuck was effective in eliminating penetration during study and will likely reduce risk of penetration/aspiration in future. Observed pt with barium pill with puree; pt required extra bite of puree to aid in transit of barium pill through oral cavity and to clear pill that lodged mid esophagus. Recommend Dys 3 solids (due to missing dentition), thin liquids (chin tuck), and meds whole with puree. Emphasized importance of utilizing chin tuck with thin liquids. ST will f/u for treatment to assess diet tolerance, safety/efficiency of swallow, and compliance with compensatory strategy. SLP Visit Diagnosis Dysphagia, oral phase (R13.11);Dysphagia,  pharyngeal phase (R13.13);Dysphagia,  pharyngoesophageal phase (R13.14) Attention and concentration deficit following -- Frontal lobe and executive function deficit following -- Impact on safety and function Moderate aspiration risk   [facility] IP TREATMENT RECOMMENDATION 12/16/2016 Treatment Recommendations Therapy as outlined in treatment plan below   Prognosis 12/16/2016 Prognosis for Safe Diet Advancement Good Barriers to Reach Goals Cognitive deficits Barriers/Prognosis Comment -- [facility] IP DIET RECOMMENDATION 12/16/2016 SLP Diet Recommendations Dysphagia 3 (Mech soft) solids;Thin liquid Liquid Administration via Cup;No straw;Other (Comment) Medication Administration Whole meds with puree Compensations Slow rate;Small sips/bites;Chin tuck Postural Changes Seated upright at 90 degrees   CH[facility] IP OTHER RECOMMENDATIONS 12/16/2016 Recommended Consults -- Oral Care Recommendations Oral care BID Other Recommendations --   [facility] IP FOLLOW UP RECOMMENDATIONS 12/16/2016 Follow up Recommendations Skilled Nursing facility   [facility] IP FREQUENCY AND DURATION 12/16/2016 Speech Therapy Frequency (ACUTE ONLY) min 2x/week Treatment Duration 2 weeks      [facility] IP ORAL PHASE 12/16/2016 Oral Phase Impaired Oral - Pudding Teaspoon -- Oral - Pudding Cup -- Oral - Honey Teaspoon -- Oral - Honey Cup -- Oral - Nectar Teaspoon -- Oral - Nectar Cup -- Oral - Nectar Straw -- Oral - Thin Teaspoon -- Oral - Thin Cup Delayed oral transit;Holding of bolus Oral - Thin Straw -- Oral - Puree -- Oral - Mech Soft -- Oral - Regular Other (Comment) Oral - Multi-Consistency -- Oral - Pill Holding of bolus;Delayed oral transit Oral Phase - Comment --  [facility] IP PHARYNGEAL PHASE 12/16/2016 Pharyngeal Phase Impaired Pharyngeal- Pudding Teaspoon -- Pharyngeal -- Pharyngeal- Pudding Cup -- Pharyngeal -- Pharyngeal- Honey Teaspoon -- Pharyngeal -- Pharyngeal- Honey Cup -- Pharyngeal -- Pharyngeal- Nectar Teaspoon -- Pharyngeal -- Pharyngeal- Nectar Cup -- Pharyngeal -- Pharyngeal-  Nectar Straw -- Pharyngeal -- Pharyngeal- Thin Teaspoon -- Pharyngeal -- Pharyngeal- Thin Cup Penetration/Aspiration during swallow;Reduced airway/laryngeal closure Pharyngeal Material enters airway, remains ABOVE vocal cords then ejected out;Material enters airway, CONTACTS cords and not ejected out Pharyngeal- Thin Straw -- Pharyngeal -- Pharyngeal- Puree -- Pharyngeal -- Pharyngeal- Mechanical Soft -- Pharyngeal -- Pharyngeal- Regular WFL Pharyngeal -- Pharyngeal- Multi-consistency -- Pharyngeal -- Pharyngeal- Pill WFL Pharyngeal -- Pharyngeal Comment --  [facility] IP CERVICAL ESOPHAGEAL PHASE 12/16/2016 Cervical Esophageal Phase WFL Pudding Teaspoon -- Pudding Cup -- Honey Teaspoon -- Honey Cup -- Nectar Teaspoon -- Nectar Cup -- Nectar Straw -- Thin Teaspoon -- Thin Cup -- Thin Straw -- Puree -- Mechanical Soft -- Regular -- Multi-consistency -- Pill -- Cervical Esophageal Comment -- No flowsheet data found. [place] Siren 12/16/2016, 12:37 PM [name] Colvin Caroli.Ed CCC-SLP Pager [phone]               Cardiac Studies   Echocardiogram 12/16/16:  Study Conclusions  - Left ventricle: The cavity size was mildly dilated. Wall   thickness was normal. Systolic function was mildly reduced. The   estimated ejection fraction was in the range of 45% to 50%.   Diffuse hypokinesis. Doppler parameters are consistent with   abnormal left ventricular relaxation (grade 1 diastolic   dysfunction). - Left atrium: The atrium was mildly dilated. - Atrial septum: No defect or patent foramen ovale was identified.  Patient Profile     66 y.o. male with known history of systolic left ventricular dysfunction, abnormal EKG suggesting prior inferior and anteroseptal infarction, refusal of prior attempts to perform cardiac ischemic workup, history of CVA, aphasia, and presents for new altered mental status and dysarthria..  Assessment & Plan    1. Chronic systolic heart failure with  improvement in LVEF compared to  prior tracings. Suspect ischemic etiology. Improvement in function likely related to medical regimen. Cannot exclude prior transient arrhythmia with tachycardia duration long enough to cause LV dysfunction which has subsequently improved. The patient has not allowed prior ischemic evaluation.   He was admitted to the hospital on a good heart failure regimen which includes Entresto, Imdur, carvedilol, and will likely require chronic diuretic therapy.  2. Acute pontine infarct felt secondary to small vessel disease and multiple other remote strokes most of which appear to be related to small vessel occlusion. Embolic phenomena cannot be totally excluded. Will fully evaluate for cardiac source including TEE and continuous ambulatory monitor as an outpatient. Atrial fibrillation in this situation seems to have a less likely a role in the etiology of disease process.  3. Essential hypertension will need better control over time. For the time being we will permit slightly higher pressures given acute stroke. Ultimate goal for blood pressure 130/80 mmHg or less.  4. Aggressive risk factor modification including lipid lowering to LDL less than 70  Signed, Sinclair Grooms, MD  12/17/2016, 11:08 AM

## 2016-12-17 NOTE — CV Procedure (Signed)
Brief TEE Note  LVEF 40-45%.  Global hypokinesis. Mild aortic regurgitation, trivial tricuspid regurgitation. No ASD or PFO by saline microcavitation study. No LA/LAA thrombus or mass.  During this procedure the patient is administered a total of Versed 7 mg and Fentanyl 50 mcg to achieve and maintain moderate conscious sedation.  The patient's heart rate, blood pressure, and oxygen saturation are monitored continuously during the procedure. The period of conscious sedation is 47 minutes, of which I was present face-to-face 100% of this time.  The patient received Versed 4 mg and Fentanyl 25 mcg before noting that his IV had infiltrated. The procedure was not started and a new IV was placed.  He then received Versed 3 mg and Fentanyl 25 mcg for the actual procedure.  For additional details see full report.  Jamica Woodyard C. Oval Linsey, MD, [facility]  12/17/2016  3:57 PM

## 2016-12-17 NOTE — Clinical Social Work Note (Signed)
Clinical Social Work Assessment  Patient Details  Name: Blake Wilson MRN: [id number] Date of Birth: [birth year]  Date of referral:  12/17/16               Reason for consult:  Trauma, Discharge Planning, Facility Placement                Permission sought to share information with:  Family Supports Permission granted to share information::     Name::        Agency::     Relationship::     Contact Information:     Housing/Transportation Living arrangements for the past 2 months:  Apartment Source of Information:  Patient Patient Interpreter Needed:  None Criminal Activity/Legal Involvement Pertinent to Current Situation/Hospitalization:  No - Comment as needed Significant Relationships:  Other(Comment) Immunologist) Lives with:  Self Do you feel safe going back to the place where you live?  Yes Need for family participation in patient care:  Yes (Comment)  Care giving concerns:  Pt lives alone and lacks support, reports only local cousins here in the area.   Social Worker assessment / plan:  CSW met with pt @ bedside. Pt pleasant, alert and oriented X4. Pt reports he lives alone and does not have much support in the area. Pt reports being totally independent prior to this admission.Pt reports no use of assistive devices and being independent with all ADL's. Pt agreeable to go to SNF (No preference). CSW will coordinate SNF transfer once pt is medically stable.  Employment status:  Retired Surveyor, [employer] Care PT Recommendations:  [place] / Referral to community resources:  [place]  Patient/Family's Response to care:  Pt appreciative of the care he is receiving and appreciates CSW's involvement with SNF placement.   Patient/Family's Understanding of and Emotional Response to Diagnosis, Current Treatment, and Prognosis:  Pt acknowledges stroke and the need to make changes in his life.  Emotional Assessment Appearance:   Appears stated age Attitude/Demeanor/Rapport:  Unable to Assess Affect (typically observed):  Accepting Orientation:  Oriented to Self, Oriented to Situation, Oriented to Place, Oriented to  Time Alcohol / Substance use:  Not Applicable Psych involvement (Current and /or in the community):  No (Comment)  Discharge Needs  Concerns to be addressed:  Home Safety Concerns Readmission within the last 30 days:  No Current discharge risk:  Lack of support system Barriers to Discharge:  Continued Medical Work up   [place], [place], [place] 12/17/2016, 4:08 PM

## 2016-12-17 NOTE — Progress Notes (Signed)
SLP Cancellation Note  Patient Details Name: Blake Wilson MRN: [id number] DOB: 01-14-1951   Cancelled treatment:        Unable to observe with food/liquid. NPO for TEE. Pt recalled his chin tuck strategy. Will follow up.                                                                                                [place] Siren 12/17/2016, 2:35 PM   [name] Colvin Caroli.[employer] [phone]

## 2016-12-17 NOTE — Progress Notes (Signed)
  Echocardiogram Echocardiogram Transesophageal has been performed.  Aggie Cosier 12/17/2016, 4:07 PM

## 2016-12-17 NOTE — Clinical Social Work Placement (Signed)
   CLINICAL SOCIAL WORK PLACEMENT  NOTE  Date:  12/17/2016  Patient Details  Name: STEPHANO CAY MRN: [id number] Date of Birth: December 26, 1950  Clinical Social Work is seeking post-discharge placement for this patient at the [place] level of care (*CSW will initial, date and re-position this form in  chart as items are completed):  Yes   Patient/family provided with [place] Work Department's list of facilities offering this level of care within the geographic area requested by the patient (or if unable, by the patient's family).  Yes   Patient/family informed of their freedom to choose among providers that offer the needed level of care, that participate in Medicare, Medicaid or managed care program needed by the patient, have an available bed and are willing to accept the patient.  Yes   Patient/family informed of [facility]'s ownership interest in [facility] and [facility], as well as of the fact that they are under no obligation to receive care at these facilities.  PASRR submitted to EDS on 12/17/16     PASRR number received on 12/17/16     Existing PASRR number confirmed on       FL2 transmitted to all facilities in geographic area requested by pt/family on 12/17/16     FL2 transmitted to all facilities within larger geographic area on       Patient informed that his/her managed care company has contracts with or will negotiate with certain facilities, including the following:            Patient/family informed of bed offers received.  Patient chooses bed at       Physician recommends and patient chooses bed at      Patient to be transferred to   on  .  Patient to be transferred to facility by       Patient family notified on   of transfer.  Name of family member notified:        PHYSICIAN Please sign FL2     Additional Comment:    _______________________________________________ Serafina Mitchell, LCSWA 12/17/2016,  4:17 PM

## 2016-12-17 NOTE — NC FL2 (Signed)
[place] LEVEL OF CARE SCREENING TOOL     IDENTIFICATION  Patient Name: Blake Wilson Birthdate: [birth year] Sex: male Admission Date (Current Location): 12/13/2016  [facility] and [place] Number:  Herbalist and Address:  [facility], [place] [address], [place], [place] [address]      Provider Number: [id number]  Attending Physician Name and Address:  Lind Covert, MD  Relative Name and Phone Number:  Zada Girt [phone]    Current Level of Care: Hospital Recommended Level of Care: [place] Prior Approval Number:    Date Approved/Denied:   PASRR Number: [id number]  Discharge Plan: SNF    Current Diagnoses: Patient Active Problem List   Diagnosis Date Noted  . Abnormal EKG 12/16/2016  . Aphasia 12/16/2016  . CVA (cerebral vascular accident) ([place])   . Altered mental status 12/13/2016  . Urinary tract infection with hematuria   . Elevated CK   . Weakness   . Low TSH level 06/13/2014  . Chronic systolic heart failure ([place]) 04/01/2014  . Pain in joint, ankle and foot 04/01/2014  . Pain in lower limb 12/22/2013  . Metatarsalgia of both feet 12/22/2013  . Equinus deformity of foot, acquired 12/22/2013  . Onychomycosis 12/22/2013  . Diabetic neuropathy, painful ([place]) 12/22/2013  . Discomfort of chest wall 10/26/2013  . Health care maintenance 10/26/2013  . Sleeping difficulty 10/07/2012  . Left leg pain 10/29/2011  . GOUT, UNSPECIFIED 06/26/2010  . Diabetes ([place]) 06/29/2009  . GLAUCOMA 04/17/2009  . Elevated lipids 03/02/2009  . CAD 03/02/2009  . OBESITY 01/17/2009  . ALCOHOL ABUSE 01/09/2009  . Congestive dilated cardiomyopathy ([place]) 01/09/2009  . HYPERTENSION, BENIGN SYSTEMIC 12/18/2006  . SLE 12/18/2006    Orientation RESPIRATION BLADDER Height & Weight     Self, Time, Situation, Place  Normal Continent Weight: 218 lb 9.6 oz (99.2 kg) Height:  6' (182.9 cm)  BEHAVIORAL  SYMPTOMS/MOOD NEUROLOGICAL BOWEL NUTRITION STATUS      Continent Diet (See DC Summary)  AMBULATORY STATUS COMMUNICATION OF NEEDS Skin   Limited Assist Verbally Normal                       Personal Care Assistance Level of Assistance  Bathing, Dressing Bathing Assistance: Limited assistance   Dressing Assistance: Limited assistance     Functional Limitations Info  Speech, Hearing, Sight Sight Info: Adequate Hearing Info: Adequate Speech Info: Adequate    SPECIAL CARE FACTORS FREQUENCY  PT (By licensed PT), OT (By licensed OT)     PT Frequency: 5x wk OT Frequency: 5x wk            Contractures Contractures Info: Not present    Additional Factors Info  Code Status, Allergies Code Status Info: Full Allergies Info: No Known Allergies           Current Medications (12/17/2016):  This is the current hospital active medication list Current Facility-Administered Medications  Medication Dose Route Frequency Provider Last Rate Last Dose  . 0.9 %  sodium chloride infusion   Intravenous Continuous Erma [place], [place]      . Doug Sou Hold] aspirin EC tablet 325 mg  325 mg Oral Daily Rosalin Hawking, MD   325 mg at 12/17/16 1111  . [MAR Hold] atorvastatin (LIPITOR) tablet 80 mg  80 mg Oral QHS Ashly M Gottschalk, DO   80 mg at 12/16/16 2210  . [MAR Hold] carvedilol (COREG) tablet 6.25  mg  6.25 mg Oral BID Koleen Distance Gottschalk, DO   6.25 mg at 12/17/16 1112  . [MAR Hold] ciprofloxacin (CIPRO) tablet 250 mg  250 mg Oral BID Janora Norlander, DO   250 mg at 12/17/16 1109  . [MAR Hold] clopidogrel (PLAVIX) tablet 75 mg  75 mg Oral Daily Rosalin Hawking, MD   75 mg at 12/17/16 1108  . [MAR Hold] enoxaparin (LOVENOX) injection 40 mg  40 mg Subcutaneous Q24H Rogue Bussing, MD   40 mg at 12/16/16 2210  . [MAR Hold] folic acid (FOLVITE) tablet 1 mg  1 mg Oral Daily Rogue Bussing, MD   1 mg at 12/17/16 1108  . [MAR Hold] furosemide (LASIX) tablet 40 mg  40 mg Oral Daily  Eloise Levels, MD      . Doug Sou Hold] insulin aspart (novoLOG) injection 0-9 Units  0-9 Units Subcutaneous TID WC Rogue Bussing, MD   2 Units at 12/14/16 1155  . [MAR Hold] insulin glargine (LANTUS) injection 5 Units  5 Units Subcutaneous QHS Rogue Bussing, MD   5 Units at 12/16/16 2219  . [MAR Hold] isosorbide mononitrate (IMDUR) 24 hr tablet 30 mg  30 mg Oral Daily Eloise Levels, MD   30 mg at 12/17/16 1107  . [MAR Hold] multivitamin with minerals tablet 1 tablet  1 tablet Oral Daily Rogue Bussing, MD   1 tablet at 12/17/16 1100  . [MAR Hold] sacubitril-valsartan (ENTRESTO) 97-103 mg per tablet  1 tablet Oral BID Rogue Bussing, MD   1 tablet at 12/17/16 1109  . [MAR Hold] thiamine (VITAMIN B-1) tablet 100 mg  100 mg Oral Daily Lind Covert, MD   100 mg at 12/17/16 1111  . [MAR Hold] travoprost (benzalkonium) (TRAVATAN) 0.004 % ophthalmic solution 1 drop  1 drop Both Eyes QHS Rogue Bussing, MD   1 drop at 12/16/16 2211     Discharge Medications: Please see discharge summary for a list of discharge medications.  Relevant Imaging Results:  Relevant Lab Results:   Additional Information [phone]  Vernal Rutan B, LCSWA

## 2016-12-17 NOTE — Interval H&P Note (Signed)
History and Physical Interval Note:  12/17/2016 2:36 PM  Blake Wilson  has presented today for surgery, with the diagnosis of STROKE  The various methods of treatment have been discussed with the patient and family. After consideration of risks, benefits and other options for treatment, the patient has consented to  Procedure(s): TRANSESOPHAGEAL ECHOCARDIOGRAM (TEE) (N/A) as a surgical intervention .  The patient's history has been reviewed, patient examined, no change in status, stable for surgery.  I have reviewed the patient's chart and labs.  Questions were answered to the patient's satisfaction.     Skeet Latch, MD 12/17/2016  2:36 PM

## 2016-12-17 NOTE — Progress Notes (Signed)
Family Medicine Teaching Service Daily Progress Note Intern Pager: [phone]  Patient name: Blake Wilson Medical record number: [id number] Date of birth: 1951/09/19 Age: 66 y.o. Gender: male  Primary Care Provider: Luiz Blare, DO Consultants: Neurology Code Status: Full code  Pt Overview and Major Events to Date:  Blake Wilson a 66 y.o.malepresenting with weakness/AMS after fall. PMH is significant for CAD, HFrEF, Dilated Cardiomyopathy, HTN, T2DM with neuropathy, Equinus Deformity of Foot, Alcohol Abuse, SLE, Obesity, Glaucoma, and History of Low TSH.  #Acute Ischemic Pontine CVA:  Echo yesterday showed diffuse hypokinesis and improved EF to 45-50% from last echo and G1DD. Evaluated by Cardiology who recommended ischemic workup in outpatient setting and to receive TEE.  - TEE today at [place] by PT/OT recommended SNF - Seen by SLP recommended dysphagia 3 diet - c/s Neuro: High dose Lipitor, ok to titrate BP meds for control.  # Dysarthria/ Stutter:  2/2 acute ischemic stroke - speech therapy to evaluate - c/s neuro as above - Blood cultures x 2 NGTD x3  # UTI:  Asymptomatic, afebrile. UCx w/ Coag neg staph  - Keflex 2/24-2/25 s/p ceftriaxone.   - Start Cipro 250mg  BID x7 days (2/25>) - Blood culture NGTD  # Weakness: Sensation intact in feet bilaterally. History of diabetic neuropathy but denies taking his prescribed gabapentin. - Thiamine per CIWA protocol - Orthostatic vital signs negative - PT/OT consult  # Elevated CK/Possible Rhabdomyolysis:Suspect 2/2 prolonged immobilization after fall. CK back to baseline and AKI resolved.  - continue PO hydration when completed - patient euvolemic, holding lasix  # Dilated non-ischemic cardiomyopathy/CHF:  Echo yesterday showed 45-50%, diffuse hypokinesis and G1DD.   - Cardiology consulted for possible ischemic component. Recommended outpatient w/u and TEE inpatient - Continue entresto BID and restarted IMDUR   for elevated BP - Holding lasix. No evidence of fluid overload.      # Diabetes: Last A1c 10.0 on 11/05/16. Reports taking 10 U lantus, though was recommended to increase to 30 U at last Office Visit. Takes metformin 1000 mg BID. Pre-prandial 118 - Continue Lantus 5 U nightly - Sensitive SSI - Hold home metformin  # History of alcohol abuse:Reports only occasional drinking. Last drink 2/21. Ethanol level neg. - CIWA protocol - scores of 0  # Hx CAD: - Continue home aspirin - continue statin  FEN/GI: dysphagia 3 diet Prophylaxis: Lovenox  Disposition: SNF  Subjective:  Feeling well this morning. No concerns.   Objective: Temp:  [97.5 F (36.4 C)-98.5 F (36.9 C)] 97.5 F (36.4 C) (02/27 0909) Pulse Rate:  [51-60] 51 (02/27 0909) Resp:  [18-20] 20 (02/27 0909) BP: (151-183)/(50-68) 166/58 (02/27 0909) SpO2:  [99 %-100 %] 99 % (02/27 0909) Weight:  [218 lb 9.6 oz (99.2 kg)] 218 lb 9.6 oz (99.2 kg) (02/26 1600) Physical Exam: General: 66yo M sitting up in bed comfortable and in NAD Cardiovascular: RRR, no MRG Respiratory: CTAB, no increased WOB Gastrointestinal: Soft, +BS, distended, no rebound or guarding MSK: no gross deformities or edema Neuro: CN2-10 WNL. R upper and LE 4/5. Sensation in tact. Attempts to answer questions but speech slurred.    Psych: Flat affect, normal mood.   Laboratory:  Recent Labs Lab 12/13/16 1251 12/14/16 0254 12/15/16 0957  WBC 8.8 5.8 4.3  HGB 13.9 12.5* 13.5  HCT 40.0 37.4* 39.5  PLT 355 277 269    Recent Labs Lab 12/14/16 0254 12/15/16 0957 12/16/16 0414  NA 138 136 137  K 3.3* 4.2 3.9  CL 105 105 107  CO2 23 24 21*  BUN 12 7 5*  CREATININE 0.86 0.79 0.79  CALCIUM 8.9 8.9 8.5*  PROT 6.3*  --   --   BILITOT 0.8  --   --   ALKPHOS 67  --   --   ALT 19  --   --   AST 27  --   --   GLUCOSE 132* 161* 101*    Imaging/Diagnostic Tests: Ct Angio Head W Or Wo Contrast  Result Date: 12/15/2016 CLINICAL DATA:   Follow-up nonhemorrhagic pontine infarct. History of hypertension, lupus, hyperlipidemia and diabetes. EXAM: CT ANGIOGRAPHY HEAD AND NECK TECHNIQUE: Multidetector CT imaging of the head and neck was performed using the standard protocol during bolus administration of intravenous contrast. Multiplanar CT image reconstructions and MIPs were obtained to evaluate the vascular anatomy. Carotid stenosis measurements (when applicable) are obtained utilizing NASCET criteria, using the distal internal carotid diameter as the denominator. CONTRAST:  50 cc Isovue 370 MRI of the head July 14, 2017 COMPARISON:  None. FINDINGS: CT HEAD FINDINGS BRAIN: No intraparenchymal hemorrhage, mass effect, midline shift. Old small bifrontal than small to moderate RIGHT parietal lobe infarcts. Old bilateral basal ganglia and LEFT cerebellar infarcts. Evolving nonhemorrhagic LEFT parasagittal pontine infarct. Moderate ventriculomegaly on the basis of global parenchymal brain volume loss. No abnormal extra-axial fluid collections. Basal cisterns are patent. VASCULAR: Moderate calcific atherosclerosis of the carotid siphons. SKULL: No skull fracture. Old minimally depressed LEFT nasal bone fracture. No significant scalp soft tissue swelling. SINUSES/ORBITS: Trace ethmoid mucosal thickening. Mastoid air cells are well aerated. Soft tissue effacing the RIGHT and partially opacifying the LEFT ostiomeatal units most compatible with cerumen. The included ocular globes and orbital contents are non-suspicious. OTHER: None. CTA NECK AORTIC ARCH: Normal appearance of the thoracic arch, mild calcific atherosclerosis. Two vessel arch is a normal variant. The origins of the innominate, left Common carotid artery and subclavian artery are widely patent. RIGHT CAROTID SYSTEM: Common carotid artery is widely patent, mild luminal irregularity compatible with atherosclerosis. Normal appearance of the carotid bifurcation without hemodynamically significant  stenosis by NASCET criteria. Small, irregular RIGHT internal carotid artery without dissection or focal stenosis. LEFT CAROTID SYSTEM: Common carotid artery is widely patent, mild luminal irregularity compatible with atherosclerosis. Normal appearance of the carotid bifurcation without hemodynamically significant stenosis by NASCET criteria, mild eccentric calcific atherosclerosis. Mild luminal irregularity of the LEFT cervical internal carotid artery. VERTEBRAL ARTERIES:LEFT vertebral artery occluded within 1 centimeter of the origin due to eccentric intimal thickening. Thready reconstitution distal LEFT V2 segment, occluded at distal LEFT V3 segment. RIGHT vertebral artery is widely patent. Mild extrinsic luminal narrowing due to degenerative cervical spine. SKELETON: No acute osseous process though bone windows have not been submitted. Poor dentition. Moderate degenerative change of the cervical spine. OTHER NECK: Soft tissues of the neck are non-acute though, not tailored for evaluation. CTA HEAD ANTERIOR CIRCULATION: Patent bilateral internal carotid arteries. Severe stenosis RIGHT para ophthalmic internal carotid artery presumably from atherosclerosis. Occluded RIGHT supraclinoid internal carotid artery. Thready reconstitution of RIGHT carotid terminus and proximal RIGHT M1 segment with attenuated RIGHT needle cerebral artery including severe tandem stenoses RIGHT M1 segment. Diminutive RIGHT A1 segment with widely patent LEFT A1 segment, and anterior communicating artery. Patent bilateral anterior cerebral arteries with moderate luminal irregularity. LEFT middle cerebral artery is patent with moderate luminal irregularity. No contrast extravasation or aneurysm. POSTERIOR CIRCULATION: Occluded LEFT intradural vertebral artery. Moderate luminal irregularity RIGHT V4 segment. Basilar artery is patent.  Posterior communicating arteries not present. Moderate luminal irregularity bilateral posterior cerebral  arteries. No contrast extravasation or aneurysm. VENOUS SINUSES: Major dural venous sinuses are patent though not tailored for evaluation on this angiographic examination. ANATOMIC VARIANTS: None. DELAYED PHASE: No abnormal intracranial enhancement. IMPRESSION: CT HEAD: Evolving nonhemorrhagic LEFT pontine infarct. Multiple old small vessel and large vascular territory infarcts. CTA NECK: Occluded LEFT vertebral artery, thready distal V2 reconstitution, occluded at distal LEFT V3 segment. Mild long segment narrowing RIGHT cervical internal carotid artery attributable to atherosclerosis or, fibromuscular dysplasia. CTA HEAD:  Occluded LEFT vertebral artery. Occluded RIGHT supraclinoid internal carotid artery, reconstituted RIGHT carotid terminus with decreased RIGHT middle cerebral artery perfusion. Severely stenotic RIGHT M1 segment. Moderate intracranial atherosclerosis. Electronically Signed   By: Elon Alas M.D.   On: 12/15/2016 17:17   Ct Angio Neck W Or Wo Contrast  Result Date: 12/15/2016 CLINICAL DATA:  Follow-up nonhemorrhagic pontine infarct. History of hypertension, lupus, hyperlipidemia and diabetes. EXAM: CT ANGIOGRAPHY HEAD AND NECK TECHNIQUE: Multidetector CT imaging of the head and neck was performed using the standard protocol during bolus administration of intravenous contrast. Multiplanar CT image reconstructions and MIPs were obtained to evaluate the vascular anatomy. Carotid stenosis measurements (when applicable) are obtained utilizing NASCET criteria, using the distal internal carotid diameter as the denominator. CONTRAST:  50 cc Isovue 370 MRI of the head July 14, 2017 COMPARISON:  None. FINDINGS: CT HEAD FINDINGS BRAIN: No intraparenchymal hemorrhage, mass effect, midline shift. Old small bifrontal than small to moderate RIGHT parietal lobe infarcts. Old bilateral basal ganglia and LEFT cerebellar infarcts. Evolving nonhemorrhagic LEFT parasagittal pontine infarct. Moderate  ventriculomegaly on the basis of global parenchymal brain volume loss. No abnormal extra-axial fluid collections. Basal cisterns are patent. VASCULAR: Moderate calcific atherosclerosis of the carotid siphons. SKULL: No skull fracture. Old minimally depressed LEFT nasal bone fracture. No significant scalp soft tissue swelling. SINUSES/ORBITS: Trace ethmoid mucosal thickening. Mastoid air cells are well aerated. Soft tissue effacing the RIGHT and partially opacifying the LEFT ostiomeatal units most compatible with cerumen. The included ocular globes and orbital contents are non-suspicious. OTHER: None. CTA NECK AORTIC ARCH: Normal appearance of the thoracic arch, mild calcific atherosclerosis. Two vessel arch is a normal variant. The origins of the innominate, left Common carotid artery and subclavian artery are widely patent. RIGHT CAROTID SYSTEM: Common carotid artery is widely patent, mild luminal irregularity compatible with atherosclerosis. Normal appearance of the carotid bifurcation without hemodynamically significant stenosis by NASCET criteria. Small, irregular RIGHT internal carotid artery without dissection or focal stenosis. LEFT CAROTID SYSTEM: Common carotid artery is widely patent, mild luminal irregularity compatible with atherosclerosis. Normal appearance of the carotid bifurcation without hemodynamically significant stenosis by NASCET criteria, mild eccentric calcific atherosclerosis. Mild luminal irregularity of the LEFT cervical internal carotid artery. VERTEBRAL ARTERIES:LEFT vertebral artery occluded within 1 centimeter of the origin due to eccentric intimal thickening. Thready reconstitution distal LEFT V2 segment, occluded at distal LEFT V3 segment. RIGHT vertebral artery is widely patent. Mild extrinsic luminal narrowing due to degenerative cervical spine. SKELETON: No acute osseous process though bone windows have not been submitted. Poor dentition. Moderate degenerative change of the  cervical spine. OTHER NECK: Soft tissues of the neck are non-acute though, not tailored for evaluation. CTA HEAD ANTERIOR CIRCULATION: Patent bilateral internal carotid arteries. Severe stenosis RIGHT para ophthalmic internal carotid artery presumably from atherosclerosis. Occluded RIGHT supraclinoid internal carotid artery. Thready reconstitution of RIGHT carotid terminus and proximal RIGHT M1 segment with attenuated RIGHT needle cerebral artery including  severe tandem stenoses RIGHT M1 segment. Diminutive RIGHT A1 segment with widely patent LEFT A1 segment, and anterior communicating artery. Patent bilateral anterior cerebral arteries with moderate luminal irregularity. LEFT middle cerebral artery is patent with moderate luminal irregularity. No contrast extravasation or aneurysm. POSTERIOR CIRCULATION: Occluded LEFT intradural vertebral artery. Moderate luminal irregularity RIGHT V4 segment. Basilar artery is patent. Posterior communicating arteries not present. Moderate luminal irregularity bilateral posterior cerebral arteries. No contrast extravasation or aneurysm. VENOUS SINUSES: Major dural venous sinuses are patent though not tailored for evaluation on this angiographic examination. ANATOMIC VARIANTS: None. DELAYED PHASE: No abnormal intracranial enhancement. IMPRESSION: CT HEAD: Evolving nonhemorrhagic LEFT pontine infarct. Multiple old small vessel and large vascular territory infarcts. CTA NECK: Occluded LEFT vertebral artery, thready distal V2 reconstitution, occluded at distal LEFT V3 segment. Mild long segment narrowing RIGHT cervical internal carotid artery attributable to atherosclerosis or, fibromuscular dysplasia. CTA HEAD:  Occluded LEFT vertebral artery. Occluded RIGHT supraclinoid internal carotid artery, reconstituted RIGHT carotid terminus with decreased RIGHT middle cerebral artery perfusion. Severely stenotic RIGHT M1 segment. Moderate intracranial atherosclerosis. Electronically Signed    By: Elon Alas M.D.   On: 12/15/2016 17:17   Dg Swallowing Func-speech Pathology  Result Date: 12/16/2016 Objective Swallowing Evaluation: Type of Study: MBS-Modified Barium Swallow Study Patient Details Name: HAYLEY DESHAIES MRN: [id number] Date of Birth: 04-07-1951 Today's Date: 12/16/2016 Time: SLP Start Time (ACUTE ONLY): 1030-SLP Stop Time (ACUTE ONLY): 1051 SLP Time Calculation (min) (ACUTE ONLY): 21 min Past Medical History: Past Medical History: Diagnosis Date . CHF (congestive heart failure) ([place])  . Diabetes mellitus  . Hyperlipidemia  . Lupus  Past Surgical History: No past surgical history on file. HPI: CASHMERE CLENNEY is a 66 y.o. male presenting with weakness/AMS after fall. PMH is significant for CAD, HFrEF, Dilated Cardiomyopathy, HTN, T2DM with neuropathy, Equinus Deformity of Foot, Alcohol Abuse, SLE, Obesity, Glaucoma, and History of Low TSH. CT positive only for chronic areas of infarction including R frontal, R parietal, L frontal, L external capsule and periventricular white matter, L superior cerebellum and central pons. Found to have UTI. MRI 12/14/16 revealed acute ischemic nonhemorrhagic pontine infarct. Pt reportedly with new stutter, slowed speech. Bedside swallow eval completed 12/15/16--SLP observed explosive coughing and s/s of aspiration. Recommended NPO until instrumental swallow eval (MBS). Subjective: Alert, pleasant, wet vocal quality Assessment / Plan / Recommendation [facility] IP CLINICAL IMPRESSIONS 12/16/2016 Clinical Impression Mr. Florkowski exhibited a mild oral and pharyngeal dysphagia. Oral deficits including holding of bolus and delayed transit with thin liquids and barium pill with puree and mild prolonged mastication with regular solids. Pharyngeal phase impairments included penetration to the level of the vocal cords during trials of thin liquids via cup due to incomplete laryngeal closure. Penetration was not sensed by pt and verbal cues to cough/clear throat were  ineffective in clearing penetrates. Educated pt re: compensatory strategy to utilize a chin tuck with cup sips of thin liquids. Chin tuck was effective in eliminating penetration during study and will likely reduce risk of penetration/aspiration in future. Observed pt with barium pill with puree; pt required extra bite of puree to aid in transit of barium pill through oral cavity and to clear pill that lodged mid esophagus. Recommend Dys 3 solids (due to missing dentition), thin liquids (chin tuck), and meds whole with puree. Emphasized importance of utilizing chin tuck with thin liquids. ST will f/u for treatment to assess diet tolerance, safety/efficiency of swallow, and compliance with compensatory strategy. SLP Visit Diagnosis Dysphagia, oral  phase (R13.11);Dysphagia, pharyngeal phase (R13.13);Dysphagia, pharyngoesophageal phase (R13.14) Attention and concentration deficit following -- Frontal lobe and executive function deficit following -- Impact on safety and function Moderate aspiration risk   [facility] IP TREATMENT RECOMMENDATION 12/16/2016 Treatment Recommendations Therapy as outlined in treatment plan below   Prognosis 12/16/2016 Prognosis for Safe Diet Advancement Good Barriers to Reach Goals Cognitive deficits Barriers/Prognosis Comment -- [facility] IP DIET RECOMMENDATION 12/16/2016 SLP Diet Recommendations Dysphagia 3 (Mech soft) solids;Thin liquid Liquid Administration via Cup;No straw;Other (Comment) Medication Administration Whole meds with puree Compensations Slow rate;Small sips/bites;Chin tuck Postural Changes Seated upright at 90 degrees   [facility] IP OTHER RECOMMENDATIONS 12/16/2016 Recommended Consults -- Oral Care Recommendations Oral care BID Other Recommendations --   [facility] IP FOLLOW UP RECOMMENDATIONS 12/16/2016 Follow up Recommendations Skilled Nursing facility   [facility] IP FREQUENCY AND DURATION 12/16/2016 Speech Therapy Frequency (ACUTE ONLY) min 2x/week Treatment Duration 2 weeks      [facility] IP ORAL PHASE 12/16/2016  Oral Phase Impaired Oral - Pudding Teaspoon -- Oral - Pudding Cup -- Oral - Honey Teaspoon -- Oral - Honey Cup -- Oral - Nectar Teaspoon -- Oral - Nectar Cup -- Oral - Nectar Straw -- Oral - Thin Teaspoon -- Oral - Thin Cup Delayed oral transit;Holding of bolus Oral - Thin Straw -- Oral - Puree -- Oral - Mech Soft -- Oral - Regular Other (Comment) Oral - Multi-Consistency -- Oral - Pill Holding of bolus;Delayed oral transit Oral Phase - Comment --  [facility] IP PHARYNGEAL PHASE 12/16/2016 Pharyngeal Phase Impaired Pharyngeal- Pudding Teaspoon -- Pharyngeal -- Pharyngeal- Pudding Cup -- Pharyngeal -- Pharyngeal- Honey Teaspoon -- Pharyngeal -- Pharyngeal- Honey Cup -- Pharyngeal -- Pharyngeal- Nectar Teaspoon -- Pharyngeal -- Pharyngeal- Nectar Cup -- Pharyngeal -- Pharyngeal- Nectar Straw -- Pharyngeal -- Pharyngeal- Thin Teaspoon -- Pharyngeal -- Pharyngeal- Thin Cup Penetration/Aspiration during swallow;Reduced airway/laryngeal closure Pharyngeal Material enters airway, remains ABOVE vocal cords then ejected out;Material enters airway, CONTACTS cords and not ejected out Pharyngeal- Thin Straw -- Pharyngeal -- Pharyngeal- Puree -- Pharyngeal -- Pharyngeal- Mechanical Soft -- Pharyngeal -- Pharyngeal- Regular WFL Pharyngeal -- Pharyngeal- Multi-consistency -- Pharyngeal -- Pharyngeal- Pill WFL Pharyngeal -- Pharyngeal Comment --  [facility] IP CERVICAL ESOPHAGEAL PHASE 12/16/2016 Cervical Esophageal Phase WFL Pudding Teaspoon -- Pudding Cup -- Honey Teaspoon -- Honey Cup -- Nectar Teaspoon -- Nectar Cup -- Nectar Straw -- Thin Teaspoon -- Thin Cup -- Thin Straw -- Puree -- Mechanical Soft -- Regular -- Multi-consistency -- Pill -- Cervical Esophageal Comment -- No flowsheet data found. [place] Siren 12/16/2016, 12:37 PM [name] Colvin Caroli.Ed CCC-SLP Pager [phone]               Eloise Levels, MD 12/17/2016, 9:38 AM PGY-1, [place] Intern pager: [phone], text pages welcome

## 2016-12-17 NOTE — Progress Notes (Addendum)
Progress Note  Patient Name: Blake Wilson Date of Encounter: 12/17/2016  Primary Cardiologist: Johnsie Cancel  Subjective   Asymptomatic with reference to cardiac complaints. Aphasia prevents adequate history.  Inpatient Medications    Scheduled Meds: . aspirin EC  325 mg Oral Daily  . atorvastatin  80 mg Oral QHS  . carvedilol  6.25 mg Oral BID  . ciprofloxacin  250 mg Oral BID  . clopidogrel  75 mg Oral Daily  . enoxaparin (LOVENOX) injection  40 mg Subcutaneous Q24H  . folic acid  1 mg Oral Daily  . insulin aspart  0-9 Units Subcutaneous TID WC  . insulin glargine  5 Units Subcutaneous QHS  . isosorbide mononitrate  30 mg Oral Daily  . multivitamin with minerals  1 tablet Oral Daily  . sacubitril-valsartan  1 tablet Oral BID  . thiamine  100 mg Oral Daily  . travoprost (benzalkonium)  1 drop Both Eyes QHS   Continuous Infusions:  PRN Meds:    Vital Signs    Vitals:   12/16/16 2100 12/17/16 0112 12/17/16 0517 12/17/16 0909  BP: (!) 154/53 (!) 151/50 (!) 168/68 (!) 166/58  Pulse: 60 (!) 52 (!) 51 (!) 51  Resp: 18 18 20 20   Temp: 98.1 F (36.7 C) 97.8 F (36.6 C) 97.8 F (36.6 C) 97.5 F (36.4 C)  TempSrc: Oral Oral Oral Oral  SpO2: 99% 100% 99% 99%  Weight:      Height:        Intake/Output Summary (Last 24 hours) at 12/17/16 1108 Last data filed at 12/17/16 0115  Gross per 24 hour  Intake              480 ml  Output             2700 ml  Net            -2220 ml   Filed Weights   12/16/16 1600  Weight: 218 lb 9.6 oz (99.2 kg)    Telemetry    No telemetry available - Personally Reviewed  ECG    Sinus rhythm with inferior and septal Q waves. No change compared to prior tracings. Date of study 12/13/16 - Personally Reviewed  Physical Exam  Lying completely flat. No dyspnea. Patient is comfortable. GEN: No acute distress.   Neck: No JVD Cardiac: RRR, no murmurs, rubs. An S4 gallop is audible. Respiratory: Clear to auscultation bilaterally. GI:  Soft, nontender, non-distended  MS: No edema; No deformity. Neuro:  Nonfocal  Psych: Normal affect   Labs    Chemistry Recent Labs Lab 12/14/16 0254 12/15/16 0957 12/16/16 0414  NA 138 136 137  K 3.3* 4.2 3.9  CL 105 105 107  CO2 23 24 21*  GLUCOSE 132* 161* 101*  BUN 12 7 5*  CREATININE 0.86 0.79 0.79  CALCIUM 8.9 8.9 8.5*  PROT 6.3*  --   --   ALBUMIN 3.1*  --   --   AST 27  --   --   ALT 19  --   --   ALKPHOS 67  --   --   BILITOT 0.8  --   --   GFRNONAA >60 >60 >60  GFRAA >60 >60 >60  ANIONGAP 10 7 9      Hematology Recent Labs Lab 12/13/16 1251 12/14/16 0254 12/15/16 0957  WBC 8.8 5.8 4.3  RBC 4.49 4.18* 4.42  HGB 13.9 12.5* 13.5  HCT 40.0 37.4* 39.5  MCV 89.1 89.5 89.4  MCH  31.0 29.9 30.5  MCHC 34.8 33.4 34.2  RDW 13.6 13.7 13.5  PLT 355 277 269    Cardiac EnzymesNo results for input(s): TROPONINI in the last 168 hours. No results for input(s): TROPIPOC in the last 168 hours.   BNPNo results for input(s): BNP, PROBNP in the last 168 hours.   DDimer No results for input(s): DDIMER in the last 168 hours.   Radiology    Ct Angio Head W Or Wo Contrast  Result Date: 12/15/2016 CLINICAL DATA:  Follow-up nonhemorrhagic pontine infarct. History of hypertension, lupus, hyperlipidemia and diabetes. EXAM: CT ANGIOGRAPHY HEAD AND NECK TECHNIQUE: Multidetector CT imaging of the head and neck was performed using the standard protocol during bolus administration of intravenous contrast. Multiplanar CT image reconstructions and MIPs were obtained to evaluate the vascular anatomy. Carotid stenosis measurements (when applicable) are obtained utilizing NASCET criteria, using the distal internal carotid diameter as the denominator. CONTRAST:  50 cc Isovue 370 MRI of the head July 14, 2017 COMPARISON:  None. FINDINGS: CT HEAD FINDINGS BRAIN: No intraparenchymal hemorrhage, mass effect, midline shift. Old small bifrontal than small to moderate RIGHT parietal lobe  infarcts. Old bilateral basal ganglia and LEFT cerebellar infarcts. Evolving nonhemorrhagic LEFT parasagittal pontine infarct. Moderate ventriculomegaly on the basis of global parenchymal brain volume loss. No abnormal extra-axial fluid collections. Basal cisterns are patent. VASCULAR: Moderate calcific atherosclerosis of the carotid siphons. SKULL: No skull fracture. Old minimally depressed LEFT nasal bone fracture. No significant scalp soft tissue swelling. SINUSES/ORBITS: Trace ethmoid mucosal thickening. Mastoid air cells are well aerated. Soft tissue effacing the RIGHT and partially opacifying the LEFT ostiomeatal units most compatible with cerumen. The included ocular globes and orbital contents are non-suspicious. OTHER: None. CTA NECK AORTIC ARCH: Normal appearance of the thoracic arch, mild calcific atherosclerosis. Two vessel arch is a normal variant. The origins of the innominate, left Common carotid artery and subclavian artery are widely patent. RIGHT CAROTID SYSTEM: Common carotid artery is widely patent, mild luminal irregularity compatible with atherosclerosis. Normal appearance of the carotid bifurcation without hemodynamically significant stenosis by NASCET criteria. Small, irregular RIGHT internal carotid artery without dissection or focal stenosis. LEFT CAROTID SYSTEM: Common carotid artery is widely patent, mild luminal irregularity compatible with atherosclerosis. Normal appearance of the carotid bifurcation without hemodynamically significant stenosis by NASCET criteria, mild eccentric calcific atherosclerosis. Mild luminal irregularity of the LEFT cervical internal carotid artery. VERTEBRAL ARTERIES:LEFT vertebral artery occluded within 1 centimeter of the origin due to eccentric intimal thickening. Thready reconstitution distal LEFT V2 segment, occluded at distal LEFT V3 segment. RIGHT vertebral artery is widely patent. Mild extrinsic luminal narrowing due to degenerative cervical spine.  SKELETON: No acute osseous process though bone windows have not been submitted. Poor dentition. Moderate degenerative change of the cervical spine. OTHER NECK: Soft tissues of the neck are non-acute though, not tailored for evaluation. CTA HEAD ANTERIOR CIRCULATION: Patent bilateral internal carotid arteries. Severe stenosis RIGHT para ophthalmic internal carotid artery presumably from atherosclerosis. Occluded RIGHT supraclinoid internal carotid artery. Thready reconstitution of RIGHT carotid terminus and proximal RIGHT M1 segment with attenuated RIGHT needle cerebral artery including severe tandem stenoses RIGHT M1 segment. Diminutive RIGHT A1 segment with widely patent LEFT A1 segment, and anterior communicating artery. Patent bilateral anterior cerebral arteries with moderate luminal irregularity. LEFT middle cerebral artery is patent with moderate luminal irregularity. No contrast extravasation or aneurysm. POSTERIOR CIRCULATION: Occluded LEFT intradural vertebral artery. Moderate luminal irregularity RIGHT V4 segment. Basilar artery is patent. Posterior communicating arteries not present.  Moderate luminal irregularity bilateral posterior cerebral arteries. No contrast extravasation or aneurysm. VENOUS SINUSES: Major dural venous sinuses are patent though not tailored for evaluation on this angiographic examination. ANATOMIC VARIANTS: None. DELAYED PHASE: No abnormal intracranial enhancement. IMPRESSION: CT HEAD: Evolving nonhemorrhagic LEFT pontine infarct. Multiple old small vessel and large vascular territory infarcts. CTA NECK: Occluded LEFT vertebral artery, thready distal V2 reconstitution, occluded at distal LEFT V3 segment. Mild long segment narrowing RIGHT cervical internal carotid artery attributable to atherosclerosis or, fibromuscular dysplasia. CTA HEAD:  Occluded LEFT vertebral artery. Occluded RIGHT supraclinoid internal carotid artery, reconstituted RIGHT carotid terminus with decreased RIGHT  middle cerebral artery perfusion. Severely stenotic RIGHT M1 segment. Moderate intracranial atherosclerosis. Electronically Signed   By: Elon Alas M.D.   On: 12/15/2016 17:17   Ct Angio Neck W Or Wo Contrast  Result Date: 12/15/2016 CLINICAL DATA:  Follow-up nonhemorrhagic pontine infarct. History of hypertension, lupus, hyperlipidemia and diabetes. EXAM: CT ANGIOGRAPHY HEAD AND NECK TECHNIQUE: Multidetector CT imaging of the head and neck was performed using the standard protocol during bolus administration of intravenous contrast. Multiplanar CT image reconstructions and MIPs were obtained to evaluate the vascular anatomy. Carotid stenosis measurements (when applicable) are obtained utilizing NASCET criteria, using the distal internal carotid diameter as the denominator. CONTRAST:  50 cc Isovue 370 MRI of the head July 14, 2017 COMPARISON:  None. FINDINGS: CT HEAD FINDINGS BRAIN: No intraparenchymal hemorrhage, mass effect, midline shift. Old small bifrontal than small to moderate RIGHT parietal lobe infarcts. Old bilateral basal ganglia and LEFT cerebellar infarcts. Evolving nonhemorrhagic LEFT parasagittal pontine infarct. Moderate ventriculomegaly on the basis of global parenchymal brain volume loss. No abnormal extra-axial fluid collections. Basal cisterns are patent. VASCULAR: Moderate calcific atherosclerosis of the carotid siphons. SKULL: No skull fracture. Old minimally depressed LEFT nasal bone fracture. No significant scalp soft tissue swelling. SINUSES/ORBITS: Trace ethmoid mucosal thickening. Mastoid air cells are well aerated. Soft tissue effacing the RIGHT and partially opacifying the LEFT ostiomeatal units most compatible with cerumen. The included ocular globes and orbital contents are non-suspicious. OTHER: None. CTA NECK AORTIC ARCH: Normal appearance of the thoracic arch, mild calcific atherosclerosis. Two vessel arch is a normal variant. The origins of the innominate, left  Common carotid artery and subclavian artery are widely patent. RIGHT CAROTID SYSTEM: Common carotid artery is widely patent, mild luminal irregularity compatible with atherosclerosis. Normal appearance of the carotid bifurcation without hemodynamically significant stenosis by NASCET criteria. Small, irregular RIGHT internal carotid artery without dissection or focal stenosis. LEFT CAROTID SYSTEM: Common carotid artery is widely patent, mild luminal irregularity compatible with atherosclerosis. Normal appearance of the carotid bifurcation without hemodynamically significant stenosis by NASCET criteria, mild eccentric calcific atherosclerosis. Mild luminal irregularity of the LEFT cervical internal carotid artery. VERTEBRAL ARTERIES:LEFT vertebral artery occluded within 1 centimeter of the origin due to eccentric intimal thickening. Thready reconstitution distal LEFT V2 segment, occluded at distal LEFT V3 segment. RIGHT vertebral artery is widely patent. Mild extrinsic luminal narrowing due to degenerative cervical spine. SKELETON: No acute osseous process though bone windows have not been submitted. Poor dentition. Moderate degenerative change of the cervical spine. OTHER NECK: Soft tissues of the neck are non-acute though, not tailored for evaluation. CTA HEAD ANTERIOR CIRCULATION: Patent bilateral internal carotid arteries. Severe stenosis RIGHT para ophthalmic internal carotid artery presumably from atherosclerosis. Occluded RIGHT supraclinoid internal carotid artery. Thready reconstitution of RIGHT carotid terminus and proximal RIGHT M1 segment with attenuated RIGHT needle cerebral artery including severe tandem stenoses RIGHT M1 segment.  Diminutive RIGHT A1 segment with widely patent LEFT A1 segment, and anterior communicating artery. Patent bilateral anterior cerebral arteries with moderate luminal irregularity. LEFT middle cerebral artery is patent with moderate luminal irregularity. No contrast extravasation  or aneurysm. POSTERIOR CIRCULATION: Occluded LEFT intradural vertebral artery. Moderate luminal irregularity RIGHT V4 segment. Basilar artery is patent. Posterior communicating arteries not present. Moderate luminal irregularity bilateral posterior cerebral arteries. No contrast extravasation or aneurysm. VENOUS SINUSES: Major dural venous sinuses are patent though not tailored for evaluation on this angiographic examination. ANATOMIC VARIANTS: None. DELAYED PHASE: No abnormal intracranial enhancement. IMPRESSION: CT HEAD: Evolving nonhemorrhagic LEFT pontine infarct. Multiple old small vessel and large vascular territory infarcts. CTA NECK: Occluded LEFT vertebral artery, thready distal V2 reconstitution, occluded at distal LEFT V3 segment. Mild long segment narrowing RIGHT cervical internal carotid artery attributable to atherosclerosis or, fibromuscular dysplasia. CTA HEAD:  Occluded LEFT vertebral artery. Occluded RIGHT supraclinoid internal carotid artery, reconstituted RIGHT carotid terminus with decreased RIGHT middle cerebral artery perfusion. Severely stenotic RIGHT M1 segment. Moderate intracranial atherosclerosis. Electronically Signed   By: Elon Alas M.D.   On: 12/15/2016 17:17   Dg Swallowing Func-speech Pathology  Result Date: 12/16/2016 Objective Swallowing Evaluation: Type of Study: MBS-Modified Barium Swallow Study Patient Details Name: ABUNDIO LEMPERT MRN: [id number] Date of Birth: 08/06/1951 Today's Date: 12/16/2016 Time: SLP Start Time (ACUTE ONLY): 1030-SLP Stop Time (ACUTE ONLY): 1051 SLP Time Calculation (min) (ACUTE ONLY): 21 min Past Medical History: Past Medical History: Diagnosis Date . CHF (congestive heart failure) ([place])  . Diabetes mellitus  . Hyperlipidemia  . Lupus  Past Surgical History: No past surgical history on file. HPI: BRANDIE BILOTTA is a 66 y.o. male presenting with weakness/AMS after fall. PMH is significant for CAD, HFrEF, Dilated Cardiomyopathy, HTN, T2DM with  neuropathy, Equinus Deformity of Foot, Alcohol Abuse, SLE, Obesity, Glaucoma, and History of Low TSH. CT positive only for chronic areas of infarction including R frontal, R parietal, L frontal, L external capsule and periventricular white matter, L superior cerebellum and central pons. Found to have UTI. MRI 12/14/16 revealed acute ischemic nonhemorrhagic pontine infarct. Pt reportedly with new stutter, slowed speech. Bedside swallow eval completed 12/15/16--SLP observed explosive coughing and s/s of aspiration. Recommended NPO until instrumental swallow eval (MBS). Subjective: Alert, pleasant, wet vocal quality Assessment / Plan / Recommendation CHL IP CLINICAL IMPRESSIONS 12/16/2016 Clinical Impression Mr. Ramirezgarcia exhibited a mild oral and pharyngeal dysphagia. Oral deficits including holding of bolus and delayed transit with thin liquids and barium pill with puree and mild prolonged mastication with regular solids. Pharyngeal phase impairments included penetration to the level of the vocal cords during trials of thin liquids via cup due to incomplete laryngeal closure. Penetration was not sensed by pt and verbal cues to cough/clear throat were ineffective in clearing penetrates. Educated pt re: compensatory strategy to utilize a chin tuck with cup sips of thin liquids. Chin tuck was effective in eliminating penetration during study and will likely reduce risk of penetration/aspiration in future. Observed pt with barium pill with puree; pt required extra bite of puree to aid in transit of barium pill through oral cavity and to clear pill that lodged mid esophagus. Recommend Dys 3 solids (due to missing dentition), thin liquids (chin tuck), and meds whole with puree. Emphasized importance of utilizing chin tuck with thin liquids. ST will f/u for treatment to assess diet tolerance, safety/efficiency of swallow, and compliance with compensatory strategy. SLP Visit Diagnosis Dysphagia, oral phase (R13.11);Dysphagia,  pharyngeal phase (R13.13);Dysphagia,  pharyngoesophageal phase (R13.14) Attention and concentration deficit following -- Frontal lobe and executive function deficit following -- Impact on safety and function Moderate aspiration risk   CHL IP TREATMENT RECOMMENDATION 12/16/2016 Treatment Recommendations Therapy as outlined in treatment plan below   Prognosis 12/16/2016 Prognosis for Safe Diet Advancement Good Barriers to Reach Goals Cognitive deficits Barriers/Prognosis Comment -- CHL IP DIET RECOMMENDATION 12/16/2016 SLP Diet Recommendations Dysphagia 3 (Mech soft) solids;Thin liquid Liquid Administration via Cup;No straw;Other (Comment) Medication Administration Whole meds with puree Compensations Slow rate;Small sips/bites;Chin tuck Postural Changes Seated upright at 90 degrees   CHL IP OTHER RECOMMENDATIONS 12/16/2016 Recommended Consults -- Oral Care Recommendations Oral care BID Other Recommendations --   CHL IP FOLLOW UP RECOMMENDATIONS 12/16/2016 Follow up Recommendations Skilled Nursing facility   [facility] IP FREQUENCY AND DURATION 12/16/2016 Speech Therapy Frequency (ACUTE ONLY) min 2x/week Treatment Duration 2 weeks      CHL IP ORAL PHASE 12/16/2016 Oral Phase Impaired Oral - Pudding Teaspoon -- Oral - Pudding Cup -- Oral - Honey Teaspoon -- Oral - Honey Cup -- Oral - Nectar Teaspoon -- Oral - Nectar Cup -- Oral - Nectar Straw -- Oral - Thin Teaspoon -- Oral - Thin Cup Delayed oral transit;Holding of bolus Oral - Thin Straw -- Oral - Puree -- Oral - Mech Soft -- Oral - Regular Other (Comment) Oral - Multi-Consistency -- Oral - Pill Holding of bolus;Delayed oral transit Oral Phase - Comment --  CHL IP PHARYNGEAL PHASE 12/16/2016 Pharyngeal Phase Impaired Pharyngeal- Pudding Teaspoon -- Pharyngeal -- Pharyngeal- Pudding Cup -- Pharyngeal -- Pharyngeal- Honey Teaspoon -- Pharyngeal -- Pharyngeal- Honey Cup -- Pharyngeal -- Pharyngeal- Nectar Teaspoon -- Pharyngeal -- Pharyngeal- Nectar Cup -- Pharyngeal -- Pharyngeal-  Nectar Straw -- Pharyngeal -- Pharyngeal- Thin Teaspoon -- Pharyngeal -- Pharyngeal- Thin Cup Penetration/Aspiration during swallow;Reduced airway/laryngeal closure Pharyngeal Material enters airway, remains ABOVE vocal cords then ejected out;Material enters airway, CONTACTS cords and not ejected out Pharyngeal- Thin Straw -- Pharyngeal -- Pharyngeal- Puree -- Pharyngeal -- Pharyngeal- Mechanical Soft -- Pharyngeal -- Pharyngeal- Regular WFL Pharyngeal -- Pharyngeal- Multi-consistency -- Pharyngeal -- Pharyngeal- Pill WFL Pharyngeal -- Pharyngeal Comment --  CHL IP CERVICAL ESOPHAGEAL PHASE 12/16/2016 Cervical Esophageal Phase WFL Pudding Teaspoon -- Pudding Cup -- Honey Teaspoon -- Honey Cup -- Nectar Teaspoon -- Nectar Cup -- Nectar Straw -- Thin Teaspoon -- Thin Cup -- Thin Straw -- Puree -- Mechanical Soft -- Regular -- Multi-consistency -- Pill -- Cervical Esophageal Comment -- No flowsheet data found. [place] Siren 12/16/2016, 12:37 PM [name] Colvin Caroli.Ed CCC-SLP Pager [phone]               Cardiac Studies   Echocardiogram 12/16/16:  Study Conclusions  - Left ventricle: The cavity size was mildly dilated. Wall   thickness was normal. Systolic function was mildly reduced. The   estimated ejection fraction was in the range of 45% to 50%.   Diffuse hypokinesis. Doppler parameters are consistent with   abnormal left ventricular relaxation (grade 1 diastolic   dysfunction). - Left atrium: The atrium was mildly dilated. - Atrial septum: No defect or patent foramen ovale was identified.  Patient Profile     66 y.o. male with known history of systolic left ventricular dysfunction, abnormal EKG suggesting prior inferior and anteroseptal infarction, refusal of prior attempts to perform cardiac ischemic workup, history of CVA, aphasia, and presents for new altered mental status and dysarthria..  Assessment & Plan    1. Chronic systolic heart failure with  improvement in LVEF compared to  prior tracings. Suspect ischemic etiology. Improvement in function likely related to medical regimen. Cannot exclude prior transient arrhythmia with tachycardia duration long enough to cause LV dysfunction which has subsequently improved. The patient has not allowed prior ischemic evaluation.   He was admitted to the hospital on a good heart failure regimen which includes Entresto, Imdur, carvedilol, and will likely require chronic diuretic therapy.  2. Acute pontine infarct felt secondary to small vessel disease and multiple other remote strokes most of which appear to be related to small vessel occlusion. Embolic phenomena cannot be totally excluded. Will fully evaluate for cardiac source including TEE and continuous ambulatory monitor as an outpatient. Atrial fibrillation in this situation seems to have a less likely role in the etiology of disease process.  3. Essential hypertension will need better control over time. For the time being we will permit slightly higher pressures given acute stroke. Ultimate goal for blood pressure 130/80 mmHg or less.  4. Aggressive risk factor modification including lipid lowering to LDL less than 70  Signed, Sinclair Grooms, MD  12/17/2016, 11:08 AM

## 2016-12-18 ENCOUNTER — Encounter (HOSPITAL_COMMUNITY): Payer: Self-pay | Admitting: Physician Assistant

## 2016-12-18 DIAGNOSIS — M6281 Muscle weakness (generalized): Secondary | ICD-10-CM | POA: Diagnosis not present

## 2016-12-18 DIAGNOSIS — N179 Acute kidney failure, unspecified: Secondary | ICD-10-CM | POA: Diagnosis not present

## 2016-12-18 DIAGNOSIS — R748 Abnormal levels of other serum enzymes: Secondary | ICD-10-CM | POA: Diagnosis not present

## 2016-12-18 DIAGNOSIS — B958 Unspecified staphylococcus as the cause of diseases classified elsewhere: Secondary | ICD-10-CM | POA: Diagnosis not present

## 2016-12-18 DIAGNOSIS — I69351 Hemiplegia and hemiparesis following cerebral infarction affecting right dominant side: Secondary | ICD-10-CM | POA: Diagnosis not present

## 2016-12-18 DIAGNOSIS — I2510 Atherosclerotic heart disease of native coronary artery without angina pectoris: Secondary | ICD-10-CM | POA: Diagnosis not present

## 2016-12-18 DIAGNOSIS — R1310 Dysphagia, unspecified: Secondary | ICD-10-CM | POA: Diagnosis not present

## 2016-12-18 DIAGNOSIS — I6930 Unspecified sequelae of cerebral infarction: Secondary | ICD-10-CM | POA: Diagnosis not present

## 2016-12-18 DIAGNOSIS — I6502 Occlusion and stenosis of left vertebral artery: Secondary | ICD-10-CM | POA: Diagnosis not present

## 2016-12-18 DIAGNOSIS — Z7902 Long term (current) use of antithrombotics/antiplatelets: Secondary | ICD-10-CM | POA: Diagnosis not present

## 2016-12-18 DIAGNOSIS — R1312 Dysphagia, oropharyngeal phase: Secondary | ICD-10-CM | POA: Diagnosis not present

## 2016-12-18 DIAGNOSIS — Z794 Long term (current) use of insulin: Secondary | ICD-10-CM | POA: Diagnosis not present

## 2016-12-18 DIAGNOSIS — I420 Dilated cardiomyopathy: Secondary | ICD-10-CM | POA: Diagnosis not present

## 2016-12-18 DIAGNOSIS — Z6829 Body mass index (BMI) 29.0-29.9, adult: Secondary | ICD-10-CM | POA: Diagnosis not present

## 2016-12-18 DIAGNOSIS — E1140 Type 2 diabetes mellitus with diabetic neuropathy, unspecified: Secondary | ICD-10-CM | POA: Diagnosis not present

## 2016-12-18 DIAGNOSIS — R4702 Dysphasia: Secondary | ICD-10-CM | POA: Diagnosis not present

## 2016-12-18 DIAGNOSIS — I6340 Cerebral infarction due to embolism of unspecified cerebral artery: Secondary | ICD-10-CM | POA: Diagnosis not present

## 2016-12-18 DIAGNOSIS — E785 Hyperlipidemia, unspecified: Secondary | ICD-10-CM | POA: Diagnosis not present

## 2016-12-18 DIAGNOSIS — N390 Urinary tract infection, site not specified: Secondary | ICD-10-CM | POA: Diagnosis not present

## 2016-12-18 DIAGNOSIS — I69320 Aphasia following cerebral infarction: Secondary | ICD-10-CM | POA: Diagnosis not present

## 2016-12-18 DIAGNOSIS — R319 Hematuria, unspecified: Secondary | ICD-10-CM | POA: Diagnosis not present

## 2016-12-18 DIAGNOSIS — F1010 Alcohol abuse, uncomplicated: Secondary | ICD-10-CM | POA: Diagnosis not present

## 2016-12-18 DIAGNOSIS — I63011 Cerebral infarction due to thrombosis of right vertebral artery: Secondary | ICD-10-CM

## 2016-12-18 DIAGNOSIS — R404 Transient alteration of awareness: Secondary | ICD-10-CM | POA: Diagnosis not present

## 2016-12-18 DIAGNOSIS — I110 Hypertensive heart disease with heart failure: Secondary | ICD-10-CM | POA: Diagnosis not present

## 2016-12-18 DIAGNOSIS — I672 Cerebral atherosclerosis: Secondary | ICD-10-CM | POA: Diagnosis not present

## 2016-12-18 DIAGNOSIS — I69391 Dysphagia following cerebral infarction: Secondary | ICD-10-CM | POA: Diagnosis not present

## 2016-12-18 DIAGNOSIS — E872 Acidosis: Secondary | ICD-10-CM | POA: Diagnosis not present

## 2016-12-18 DIAGNOSIS — I63019 Cerebral infarction due to thrombosis of unspecified vertebral artery: Secondary | ICD-10-CM | POA: Diagnosis not present

## 2016-12-18 DIAGNOSIS — R4182 Altered mental status, unspecified: Secondary | ICD-10-CM | POA: Diagnosis not present

## 2016-12-18 DIAGNOSIS — E875 Hyperkalemia: Secondary | ICD-10-CM | POA: Diagnosis not present

## 2016-12-18 DIAGNOSIS — E860 Dehydration: Secondary | ICD-10-CM | POA: Diagnosis not present

## 2016-12-18 DIAGNOSIS — R488 Other symbolic dysfunctions: Secondary | ICD-10-CM | POA: Diagnosis not present

## 2016-12-18 DIAGNOSIS — I63012 Cerebral infarction due to thrombosis of left vertebral artery: Secondary | ICD-10-CM | POA: Diagnosis not present

## 2016-12-18 DIAGNOSIS — I69322 Dysarthria following cerebral infarction: Secondary | ICD-10-CM | POA: Diagnosis not present

## 2016-12-18 DIAGNOSIS — Z87891 Personal history of nicotine dependence: Secondary | ICD-10-CM | POA: Diagnosis not present

## 2016-12-18 DIAGNOSIS — I10 Essential (primary) hypertension: Secondary | ICD-10-CM | POA: Diagnosis not present

## 2016-12-18 DIAGNOSIS — E663 Overweight: Secondary | ICD-10-CM | POA: Diagnosis not present

## 2016-12-18 DIAGNOSIS — I5022 Chronic systolic (congestive) heart failure: Secondary | ICD-10-CM | POA: Diagnosis not present

## 2016-12-18 DIAGNOSIS — H409 Unspecified glaucoma: Secondary | ICD-10-CM | POA: Diagnosis not present

## 2016-12-18 DIAGNOSIS — R9431 Abnormal electrocardiogram [ECG] [EKG]: Secondary | ICD-10-CM | POA: Diagnosis not present

## 2016-12-18 DIAGNOSIS — R627 Adult failure to thrive: Secondary | ICD-10-CM | POA: Diagnosis not present

## 2016-12-18 DIAGNOSIS — Z7982 Long term (current) use of aspirin: Secondary | ICD-10-CM | POA: Diagnosis not present

## 2016-12-18 DIAGNOSIS — R4701 Aphasia: Secondary | ICD-10-CM | POA: Diagnosis not present

## 2016-12-18 DIAGNOSIS — R531 Weakness: Secondary | ICD-10-CM | POA: Diagnosis not present

## 2016-12-18 DIAGNOSIS — I638 Other cerebral infarction: Secondary | ICD-10-CM | POA: Diagnosis not present

## 2016-12-18 LAB — GLUCOSE, CAPILLARY
GLUCOSE-CAPILLARY: 169 mg/dL — AB (ref 65–99)
GLUCOSE-CAPILLARY: 96 mg/dL (ref 65–99)
Glucose-Capillary: 110 mg/dL — ABNORMAL HIGH (ref 65–99)

## 2016-12-18 LAB — CULTURE, BLOOD (ROUTINE X 2)
CULTURE: NO GROWTH
CULTURE: NO GROWTH

## 2016-12-18 MED ORDER — CLOPIDOGREL BISULFATE 75 MG PO TABS: 75.0000 mg | ORAL_TABLET | Freq: Every day | ORAL | 0 refills | Status: DC

## 2016-12-18 MED ORDER — ASPIRIN 325 MG PO TBEC: 325.0000 mg | DELAYED_RELEASE_TABLET | Freq: Every day | ORAL | 0 refills | Status: DC

## 2016-12-18 MED ORDER — INSULIN GLARGINE 100 UNIT/ML ~~LOC~~ SOLN: 5.0000 [IU] | Freq: Every day | SUBCUTANEOUS | 11 refills | Status: DC

## 2016-12-18 MED ORDER — CIPROFLOXACIN HCL 250 MG PO TABS: 250.0000 mg | ORAL_TABLET | Freq: Two times a day (BID) | ORAL | 0 refills | Status: AC

## 2016-12-18 MED ORDER — CARVEDILOL 6.25 MG PO TABS: 6.2500 mg | ORAL_TABLET | Freq: Two times a day (BID) | ORAL | 0 refills | Status: DC

## 2016-12-18 NOTE — Clinical Social Work Placement (Signed)
   CLINICAL SOCIAL WORK PLACEMENT  NOTE  Date:  12/18/2016  Patient Details  Name: MARQUEIS SHILLINGBURG MRN: [id number] Date of Birth: 13-Dec-1950  Clinical Social Work is seeking post-discharge placement for this patient at the [place] level of care (*CSW will initial, date and re-position this form in  chart as items are completed):  Yes   Patient/family provided with [facility] Work Department's list of facilities offering this level of care within the geographic area requested by the patient (or if unable, by the patient's family).  Yes   Patient/family informed of their freedom to choose among providers that offer the needed level of care, that participate in Medicare, Medicaid or managed care program needed by the patient, have an available bed and are willing to accept the patient.  Yes   Patient/family informed of [facility]'s ownership interest in [facility] and [facility], as well as of the fact that they are under no obligation to receive care at these facilities.  PASRR submitted to EDS on 12/17/16     PASRR number received on 12/17/16     Existing PASRR number confirmed on       FL2 transmitted to all facilities in geographic area requested by pt/family on 12/17/16     FL2 transmitted to all facilities within larger geographic area on       Patient informed that his/her managed care company has contracts with or will negotiate with certain facilities, including the following:        Yes   Patient/family informed of bed offers received.  Patient chooses bed at [place] recommends and patient chooses bed at      Patient to be [facility] to [facility] on 12/18/16.  Patient to be transferred to facility by Ambulance     Patient family notified on 12/18/16 of transfer.  Name of family member notified:  Left message for patient emergency contact - [place] Please sign FL2     Additional Comment:    Barbette Or, [place]

## 2016-12-18 NOTE — Progress Notes (Signed)
Progress Note  Patient Name: Blake Wilson Date of Encounter: 12/18/2016  Primary Cardiologist: Dr. Johnsie Cancel  Subjective   Denies any CP, SOB, palpitations. Obviously frustrated by his aphasia. Indicates to me he's going to be going to a rehab facility after this hospitalization ([facility] is circled).  I located a cath note from 2009 evaluating his cardiomyopathy which showed mild nonbstructive CAD - 30-40% distal LM, 40% prox-mid LAD, 30-40% intermediate branch, 30% multiple D1 lesions, 30% Cx, 30% mRCA, LVEF 35%.  Inpatient Medications    Scheduled Meds: . aspirin EC  325 mg Oral Daily  . atorvastatin  80 mg Oral QHS  . carvedilol  6.25 mg Oral BID  . ciprofloxacin  250 mg Oral BID  . clopidogrel  75 mg Oral Daily  . enoxaparin (LOVENOX) injection  40 mg Subcutaneous Q24H  . folic acid  1 mg Oral Daily  . furosemide  40 mg Oral Daily  . insulin aspart  0-9 Units Subcutaneous TID WC  . insulin glargine  5 Units Subcutaneous QHS  . isosorbide mononitrate  30 mg Oral Daily  . multivitamin with minerals  1 tablet Oral Daily  . sacubitril-valsartan  1 tablet Oral BID  . thiamine  100 mg Oral Daily  . travoprost (benzalkonium)  1 drop Both Eyes QHS   Continuous Infusions:  PRN Meds:    Vital Signs    Vitals:   12/17/16 2056 12/18/16 0111 12/18/16 0513 12/18/16 1007  BP: 130/66 108/69 120/88 130/61  Pulse: 65 74 60 60  Resp: 18 18 18 19   Temp: 97.6 F (36.4 C) 97.5 F (36.4 C) 97.3 F (36.3 C) 97.7 F (36.5 C)  TempSrc: Oral Oral Oral Oral  SpO2: 99% 97% 99% 98%  Weight:      Height:        Intake/Output Summary (Last 24 hours) at 12/18/16 1306 Last data filed at 12/18/16 0116  Gross per 24 hour  Intake              120 ml  Output              400 ml  Net             -280 ml   Filed Weights   12/16/16 1600  Weight: 218 lb 9.6 oz (99.2 kg)    Telemetry    NSR/SB - Personally Reviewed  Physical Exam   GEN: No acute distress. Lying completely flat  without dyspnea. HEENT: Normocephalic, atraumatic. Neck: No JVD or bruits. Cardiac: RRR, no murmurs, rubs. Faint S4.  Radials/DP/PT 1+ and equal bilaterally.  Respiratory: Clear to auscultation bilaterally. Breathing is unlabored. GI: Soft, nontender, non-distended, BS +x 4. MS: no deformity. Extremities: No clubbing or cyanosis. No edema. Distal pedal pulses are 2+ and equal bilaterally. Neuro:  Aphasic, follows commands. Receptive language in tact. Psych:  Normal affect.  Labs    Chemistry Recent Labs Lab 12/14/16 0254 12/15/16 0957 12/16/16 0414  NA 138 136 137  K 3.3* 4.2 3.9  CL 105 105 107  CO2 23 24 21*  GLUCOSE 132* 161* 101*  BUN 12 7 5*  CREATININE 0.86 0.79 0.79  CALCIUM 8.9 8.9 8.5*  PROT 6.3*  --   --   ALBUMIN 3.1*  --   --   AST 27  --   --   ALT 19  --   --   ALKPHOS 67  --   --   BILITOT 0.8  --   --  GFRNONAA >60 >60 >60  GFRAA >60 >60 >60  ANIONGAP 10 7 9      Hematology Recent Labs Lab 12/13/16 1251 12/14/16 0254 12/15/16 0957  WBC 8.8 5.8 4.3  RBC 4.49 4.18* 4.42  HGB 13.9 12.5* 13.5  HCT 40.0 37.4* 39.5  MCV 89.1 89.5 89.4  MCH 31.0 29.9 30.5  MCHC 34.8 33.4 34.2  RDW 13.6 13.7 13.5  PLT 355 277 269    Radiology    No results found.  Cardiac Studies   Echocardiogram 12/16/16: Study Conclusions - Left ventricle: The cavity size was mildly dilated. Wall thickness was normal. Systolic function was mildly reduced. The estimated ejection fraction was in the range of 45% to 50%. Diffuse hypokinesis. Doppler parameters are consistent with abnormal left ventricular relaxation (grade 1 diastolic dysfunction). - Left atrium: The atrium was mildly dilated. - Atrial septum: No defect or patent foramen ovale was identified.   Patient Profile     66 y.o. male with chronic systolic CHF with fluctuating cardiomyopathy, nonobstructive CAD by cath 2009, lupus, DM, hyperlipidemia, alcohol abuse, obesity, low TSH admitted with  fall and dysarthria, found to have acute pontine CVA and rhabdomyolysis. 2D echo shows EF 45-50% (previous fluctuations from 20% to 55% then 25-30% in 2015 prior to this most recent one in the setting of noncompliance). Per cardiology consult note 2/26, patient is participating in a research trial and is supposed to be on Entresto, Coreg, Imdur but only reported being on [place].    Assessment & Plan    1. Acute pontine stroke felt secondary to small vessel disease and multiple other remote strokes most of which appear to be related to small vessel occlusion. Embolic phenomena cannot be totally excluded. TEE without thrombus. Per Dr. Tamala Julian, plan continuous ambulatory monitor as an outpatient. Atrial fibrillation in this situation seems to have a less likely  role in the etiology of disease process but should be excluded. Please let us know when patient is discharged so we can arrange (can also send staff message to Gay Filler, our cardmaster, to help arrange if after-hours).  2. Chronic systolic CHF/prior NICM - with improvement in LVEF compared to prior echo. Possible noncompliance with meds besides Entresto as noted above. Continue BB, Imdur, Entresto. Further med titration as OP. The patient previously declined repeat ischemic eval in 2015 when his EF dropped again in the setting of med noncompliance. Will need to revisit this as OP.  3. Mild CAD by cath 2009 - remains on ASA as part of his stroke management (as well as Plavix). Continue BB/statin.  4. Essential HTN - controlled.  5. Hyperlipidemia: recent LDL 11/14/16 was all the way up to 178 bringing into question compliance of statin therapy. Will need f/u as OP of this. If this remains suboptimal despite max dose atorvastatin, may need to consider Zetia or PCSK-9 given his stroke and need for strict control.  Signed, Charlie Pitter, PA-C  12/18/2016, 1:06 PM   The patient has been seen in conjunction with Melina Copa, PA-C. All aspects of  care have been considered and discussed. The patient has been personally interviewed, examined, and all clinical data has been reviewed.   Agree with note as outlined above.

## 2016-12-18 NOTE — Clinical Social Work Note (Signed)
Clinical Social Worker facilitated patient discharge including contacting patient and facility to confirm patient discharge plans.  Clinical information faxed to facility and patient agreeable with plan.  CSW arranged ambulance transport via [facility] to [place].  RN to call report prior to discharge.  Insurance authorization from [facility] is [id number].  No family contact information available at this time.  Patient understands and is agreeable with transfer.  Clinical Social Worker will sign off for now as social work intervention is no longer needed. Please consult [place] again if new need arises.  Barbette Or, [place]

## 2016-12-18 NOTE — Progress Notes (Signed)
Family Medicine Teaching Service Daily Progress Note Intern Pager: [phone]  Patient name: Blake Wilson Medical record number: [id number] Date of birth: [birth year] Age: 66 y.o. Gender: male  Primary Care Provider: Luiz Blare, DO Consultants: Neurology Code Status: Full code  Pt Overview and Major Events to Date:  Blake Wilson a 66 y.o.malepresenting with weakness/AMS after fall. PMH is significant for CAD, HFrEF, Dilated Cardiomyopathy, HTN, T2DM with neuropathy, Equinus Deformity of Foot, Alcohol Abuse, SLE, Obesity, Glaucoma, and History of Low TSH.  Acute Ischemic Pontine CVA:  Echo w/diffuse hypokinesis and improved EF to 45-50% from last echo and G1DD. TEE w/ global hypokinesis, EF 40-45%. Cardiology rec ischemic workup as outpt - PT/OT rec SNF> Waiting bed placement - SLP >> dysphagia 3 diet - c/s Neuro: High dose Lipitor, ok to titrate BP meds for control.  Dysarthria/ Stutter:  2/2 acute ischemic stroke - speech therapy to evaluate - c/s neuro as above - Blood cultures x 2 NGTD x4D  UTI:  Asymptomatic, afebrile. UCx w/ Coag neg staph  - Keflex 2/24-2/25 s/p ceftriaxone.   - Cipro 250mg  BID x7 days (2/25>3/3) - Blood culture NGTD   Weakness: Sensation intact in feet bilaterally. History of diabetic neuropathy but denies taking his prescribed gabapentin. - Thiamine per CIWA protocol - Orthostatic vital signs negative - PT/OT consult  Elevated CK/Possible Rhabdomyolysis:Suspect 2/2 prolonged immobilization after fall. CK back to baseline and AKI resolved.  - continue PO hydration when completed - patient euvolemic, holding lasix  Dilated non-ischemic cardiomyopathy/CHF:  S/p Echo and TEE this hospitalization - Cards - outpatient w/u after DC - Continue entresto BID and restarted IMDUR  for elevated BP - Holding lasix. No evidence of fluid overload.      Diabetes: Last A1c 10.0 on 11/05/16. Takes metformin 1000 mg BID and 10u lantus at home (though  rec inc to 30u at last office visit. CBG 100s. No sliding scale used over last 24h. - Continue Lantus 5u nightly - Sensitive SSI - Hold home metformin  History of alcohol abuse:Reports only occasional drinking. Last drink 2/21. Ethanol level neg. - CIWA protocol - scores of 1,3  Hx CAD: - Continue home aspirin - continue statin  FEN/GI: dysphagia 3 diet Prophylaxis: Lovenox  Disposition: SNF  Subjective:  Patient had no acute events overnight. He is complaining of worsened dysarthria this morning. Difficult to assess whether this is an acute change. Will ask presenting physician also saw the patient yesterday whether his dysphagia is truly worsened acutely.  Objective: Temp:  [97.3 F (36.3 C)-98.4 F (36.9 C)] 97.3 F (36.3 C) (02/28 0513) Pulse Rate:  [49-88] 60 (02/28 0513) Resp:  [11-22] 18 (02/28 0513) BP: (102-175)/(40-88) 120/88 (02/28 0513) SpO2:  [94 %-100 %] 99 % (02/28 0513) Physical Exam: General: Patient rests in bed, NAD Cardiovascular: RRR, no MRG Respiratory: CTAB, no increased WOB Gastrointestinal: Soft, +BS, distended, no rebound or guarding MSK: no gross deformities or edema Neuro: CN2-10 WNL. R upper and LE 4/5. Sensation in tact. Attempts to answer questions but speech very slurred Psych: Appropriately frustrated with inability to answer questions, cannot assess thought process due to dysarthria  Laboratory:  Recent Labs Lab 12/13/16 1251 12/14/16 0254 12/15/16 0957  WBC 8.8 5.8 4.3  HGB 13.9 12.5* 13.5  HCT 40.0 37.4* 39.5  PLT 355 277 269    Recent Labs Lab 12/14/16 0254 12/15/16 0957 12/16/16 0414  NA 138 136 137  K 3.3* 4.2 3.9  CL 105 105 107  CO2 23  24 21*  BUN 12 7 5*  CREATININE 0.86 0.79 0.79  CALCIUM 8.9 8.9 8.5*  PROT 6.3*  --   --   BILITOT 0.8  --   --   ALKPHOS 67  --   --   ALT 19  --   --   AST 27  --   --   GLUCOSE 132* 161* 101*    Imaging/Diagnostic Tests: Ct Angio Head W Or Wo Contrast  12/15/2016 FINDINGS: CT HEAD FINDINGS BRAIN: No intraparenchymal hemorrhage, mass effect, midline shift. Old small bifrontal than small to moderate RIGHT parietal lobe infarcts. Old bilateral basal ganglia and LEFT cerebellar infarcts. Evolving nonhemorrhagic LEFT parasagittal pontine infarct. Moderate ventriculomegaly on the basis of global parenchymal brain volume loss. No abnormal extra-axial fluid collections. Basal cisterns are patent. VASCULAR: Moderate calcific atherosclerosis of the carotid siphons. SKULL: No skull fracture. Old minimally depressed LEFT nasal bone fracture. No significant scalp soft tissue swelling. SINUSES/ORBITS: Trace ethmoid mucosal thickening. Mastoid air cells are well aerated. Soft tissue effacing the RIGHT and partially opacifying the LEFT ostiomeatal units most compatible with cerumen. The included ocular globes and orbital contents are non-suspicious. OTHER: None. CTA NECK AORTIC ARCH: Normal appearance of the thoracic arch, mild calcific atherosclerosis. Two vessel arch is a normal variant. The origins of the innominate, left Common carotid artery and subclavian artery are widely patent. RIGHT CAROTID SYSTEM: Common carotid artery is widely patent, mild luminal irregularity compatible with atherosclerosis. Normal appearance of the carotid bifurcation without hemodynamically significant stenosis by NASCET criteria. Small, irregular RIGHT internal carotid artery without dissection or focal stenosis. LEFT CAROTID SYSTEM: Common carotid artery is widely patent, mild luminal irregularity compatible with atherosclerosis. Normal appearance of the carotid bifurcation without hemodynamically significant stenosis by NASCET criteria, mild eccentric calcific atherosclerosis. Mild luminal irregularity of the LEFT cervical internal carotid artery. VERTEBRAL ARTERIES:LEFT vertebral artery occluded within 1 centimeter of the origin due to eccentric intimal thickening. Thready reconstitution  distal LEFT V2 segment, occluded at distal LEFT V3 segment. RIGHT vertebral artery is widely patent. Mild extrinsic luminal narrowing due to degenerative cervical spine. SKELETON: No acute osseous process though bone windows have not been submitted. Poor dentition. Moderate degenerative change of the cervical spine. OTHER NECK: Soft tissues of the neck are non-acute though, not tailored for evaluation. CTA HEAD ANTERIOR CIRCULATION: Patent bilateral internal carotid arteries. Severe stenosis RIGHT para ophthalmic internal carotid artery presumably from atherosclerosis. Occluded RIGHT supraclinoid internal carotid artery. Thready reconstitution of RIGHT carotid terminus and proximal RIGHT M1 segment with attenuated RIGHT needle cerebral artery including severe tandem stenoses RIGHT M1 segment. Diminutive RIGHT A1 segment with widely patent LEFT A1 segment, and anterior communicating artery. Patent bilateral anterior cerebral arteries with moderate luminal irregularity. LEFT middle cerebral artery is patent with moderate luminal irregularity. No contrast extravasation or aneurysm. POSTERIOR CIRCULATION: Occluded LEFT intradural vertebral artery. Moderate luminal irregularity RIGHT V4 segment. Basilar artery is patent. Posterior communicating arteries not present. Moderate luminal irregularity bilateral posterior cerebral arteries. No contrast extravasation or aneurysm. VENOUS SINUSES: Major dural venous sinuses are patent though not tailored for evaluation on this angiographic examination. ANATOMIC VARIANTS: None. DELAYED PHASE: No abnormal intracranial enhancement. IMPRESSION:  CT HEAD: Evolving nonhemorrhagic LEFT pontine infarct.  Multiple old small vessel and large vascular territory infarcts. CTA NECK: Occluded LEFT vertebral artery, thready distal V2 reconstitution, occluded at distal LEFT V3 segment. Mild long segment narrowing RIGHT cervical internal carotid artery attributable to atherosclerosis or,  fibromuscular dysplasia.  CTA HEAD:  Occluded  LEFT vertebral artery. Occluded RIGHT supraclinoid internal carotid artery, reconstituted RIGHT carotid terminus with decreased RIGHT middle cerebral artery perfusion. Severely stenotic RIGHT M1 segment. Moderate intracranial atherosclerosis.   Ct Angio Neck W Or Wo Contrast 12/15/2016 FINDINGS: CT HEAD FINDINGS BRAIN: No intraparenchymal hemorrhage, mass effect, midline shift. Old small bifrontal than small to moderate RIGHT parietal lobe infarcts. Old bilateral basal ganglia and LEFT cerebellar infarcts. Evolving nonhemorrhagic LEFT parasagittal pontine infarct. Moderate ventriculomegaly on the basis of global parenchymal brain volume loss. No abnormal extra-axial fluid collections. Basal cisterns are patent. VASCULAR: Moderate calcific atherosclerosis of the carotid siphons. SKULL: No skull fracture. Old minimally depressed LEFT nasal bone fracture. No significant scalp soft tissue swelling. SINUSES/ORBITS: Trace ethmoid mucosal thickening. Mastoid air cells are well aerated. Soft tissue effacing the RIGHT and partially opacifying the LEFT ostiomeatal units most compatible with cerumen. The included ocular globes and orbital contents are non-suspicious. OTHER: None. CTA NECK AORTIC ARCH: Normal appearance of the thoracic arch, mild calcific atherosclerosis. Two vessel arch is a normal variant. The origins of the innominate, left Common carotid artery and subclavian artery are widely patent. RIGHT CAROTID SYSTEM: Common carotid artery is widely patent, mild luminal irregularity compatible with atherosclerosis. Normal appearance of the carotid bifurcation without hemodynamically significant stenosis by NASCET criteria. Small, irregular RIGHT internal carotid artery without dissection or focal stenosis. LEFT CAROTID SYSTEM: Common carotid artery is widely patent, mild luminal irregularity compatible with atherosclerosis. Normal appearance of the carotid bifurcation  without hemodynamically significant stenosis by NASCET criteria, mild eccentric calcific atherosclerosis. Mild luminal irregularity of the LEFT cervical internal carotid artery. VERTEBRAL ARTERIES:LEFT vertebral artery occluded within 1 centimeter of the origin due to eccentric intimal thickening. Thready reconstitution distal LEFT V2 segment, occluded at distal LEFT V3 segment. RIGHT vertebral artery is widely patent. Mild extrinsic luminal narrowing due to degenerative cervical spine. SKELETON: No acute osseous process though bone windows have not been submitted. Poor dentition. Moderate degenerative change of the cervical spine. OTHER NECK: Soft tissues of the neck are non-acute though, not tailored for evaluation. CTA HEAD ANTERIOR CIRCULATION: Patent bilateral internal carotid arteries. Severe stenosis RIGHT para ophthalmic internal carotid artery presumably from atherosclerosis. Occluded RIGHT supraclinoid internal carotid artery. Thready reconstitution of RIGHT carotid terminus and proximal RIGHT M1 segment with attenuated RIGHT needle cerebral artery including severe tandem stenoses RIGHT M1 segment. Diminutive RIGHT A1 segment with widely patent LEFT A1 segment, and anterior communicating artery. Patent bilateral anterior cerebral arteries with moderate luminal irregularity. LEFT middle cerebral artery is patent with moderate luminal irregularity. No contrast extravasation or aneurysm. POSTERIOR CIRCULATION: Occluded LEFT intradural vertebral artery. Moderate luminal irregularity RIGHT V4 segment. Basilar artery is patent. Posterior communicating arteries not present. Moderate luminal irregularity bilateral posterior cerebral arteries. No contrast extravasation or aneurysm. VENOUS SINUSES: Major dural venous sinuses are patent though not tailored for evaluation on this angiographic examination. ANATOMIC VARIANTS: None. DELAYED PHASE: No abnormal intracranial enhancement. IMPRESSION: CT HEAD: Evolving  nonhemorrhagic LEFT pontine infarct. Multiple old small vessel and large vascular territory infarcts. CTA NECK: Occluded LEFT vertebral artery, thready distal V2 reconstitution, occluded at distal LEFT V3 segment. Mild long segment narrowing RIGHT cervical internal carotid artery attributable to atherosclerosis or, fibromuscular dysplasia. CTA HEAD:  Occluded LEFT vertebral artery. Occluded RIGHT supraclinoid internal carotid artery, reconstituted RIGHT carotid terminus with decreased RIGHT middle cerebral artery perfusion. Severely stenotic RIGHT M1 segment. Moderate intracranial atherosclerosis. Electronically Signed   By: Elon Alas M.D.   On: 12/15/2016 17:17   Mr Brain  W Wo Contrast 12/14/2016 FINDINGS: Brain: Diffuse prominence of the CSF containing spaces is compatible with generalized age-related cerebral atrophy. Patchy and confluent T2/FLAIR hyperintensity within the periventricular and deep white matter both cerebral hemispheres most compatible chronic microvascular ischemic disease. Multiple scattered areas of encephalomalacia are present within the anterior frontal lobes bilaterally, compatible with remote ischemic infarcts. Additional small remote right parietal and occipital lobe infarcts present as well. Remote lacunar infarcts involve the bilateral basal ganglia. Small amount of chronic hemorrhage noted with the remote left basal ganglia infarct. Scattered remote left cerebellar infarcts present as well. There is abnormal restricted diffusion involving the central and left paramedian aspect of the pons, compatible with acute ischemic infarct (series 4, image 17). This is most predominant at the ventral aspect of the pons. No associated hemorrhage or significant mass effect. No other evidence for acute or subacute ischemia. Gray-white matter differentiation otherwise maintained. No mass lesion, midline shift, or mass effect. No hydrocephalus. No extra-axial fluid collection. Major dural  sinuses are grossly patent. No abnormal enhancement. Pituitary gland and suprasellar region within normal limits. Vascular: Abnormal flow void within the distal left vertebral artery, which may be related to slow flow and/ or occlusion (series 7, image 3). This is suspected to be chronic in nature given the remote left cerebellar infarcts. Normal flow voids seen within the remainder the vertebrobasilar system. Intravascular flow voids preserved within the anterior circulation as well. Skull and upper cervical spine: Craniocervical junction within normal limits. Visualized upper cervical spine unremarkable. Bone marrow signal intensity within normal limits. No scalp soft tissue abnormality. Sinuses/Orbits: Globes and oval soft tissues within normal limits. Mild scattered mucosal thickening within the ethmoidal air cells and maxillary sinuses. Paranasal sinuses are otherwise clear. No mastoid effusion. Inner ear structures grossly normal. Other: No other significant finding.  IMPRESSION:  1. Acute ischemic nonhemorrhagic pontine infarct as above. No associated mass effect.  2. Multiple additional remote/chronic ischemic infarcts as detailed above.  3. Generalized age-related cerebral atrophy with moderate chronic microvascular ischemic disease.  4. Abnormal flow void within the distal left vertebral artery, compatible with slow flow and/or occlusion. This is suspected to be chronic in nature given the presence of multiple remote left cerebellar infarcts.   Everrett Coombe, MD 12/18/2016, 9:38 AM PGY-1, [place] Intern pager: [phone], text pages welcome

## 2016-12-18 NOTE — Progress Notes (Addendum)
  Speech Language Pathology Treatment: Dysphagia;Cognitive-Linquistic  Patient Details Name: Blake Wilson MRN: [id number] DOB: 29-Nov-1950 Today's Date: 12/18/2016 Time: [id number] SLP Time Calculation (min) (ACUTE ONLY): 20 min  Assessment / Plan / Recommendation Clinical Impression  Pt seen during breakfast meal. He used external aid for recall of chin tuck strategy with Mod I; however, during meal he needed Max cues for coordination of chin tuck before the swallow, clearance of oral residue from solid POs, and slower rate/not taking more until the bolus was cleared. Intermittent coughing observed. SLP manually removed solid residue from his mouth after cues for lingual sweep, liquid wash, and pureed boluses were not able to clear it. Recommend to downgrade diet to Dys 2 textures, continuing thin liquids, but with need for full supervision for safety.    HPI HPI: Blake Wilson is a 66 y.o. male presenting with weakness/AMS after fall. PMH is significant for CAD, HFrEF, Dilated Cardiomyopathy, HTN, T2DM with neuropathy, Equinus Deformity of Foot, Alcohol Abuse, SLE, Obesity, Glaucoma, and History of Low TSH. CT positive only for chronic areas of infarction including R frontal, R parietal, L frontal, L external capsule and periventricular white matter, L superior cerebellum and central pons. Found to have UTI. MRI 12/14/16 revealed acute ischemic nonhemorrhagic pontine infarct. Pt reportedly with new stutter, slowed speech. Bedside swallow eval completed 12/15/16--SLP observed explosive coughing and s/s of aspiration. Recommended NPO until instrumental swallow eval (MBS).      SLP Plan  Continue with current plan of care       Recommendations  Diet recommendations: Dysphagia 2 (fine chop);Thin liquid Liquids provided via: Cup;No straw Medication Administration: Whole meds with puree Supervision: Patient able to self feed;Full supervision/cueing for compensatory strategies Compensations:  Slow rate;Small sips/bites;Chin tuck;Lingual sweep for clearance of pocketing Postural Changes and/or Swallow Maneuvers: Seated upright 90 degrees;Upright 30-60 min after meal;Chin tuck                Oral Care Recommendations: Oral care before and after PO Follow up Recommendations: Skilled Nursing facility SLP Visit Diagnosis: Dysphagia, oropharyngeal phase (R13.12) Plan: Continue with current plan of care       GO                Germain Osgood 12/18/2016, 11:07 AM  Germain Osgood, M.A. CCC-SLP [phone]

## 2016-12-18 NOTE — Care Management Important Message (Signed)
Important Message  Patient Details  Name: Blake Wilson MRN: [id number] Date of Birth: Dec 22, 1950   Medicare Important Message Given:  Yes    [name] 12/18/2016, 10:37 AM

## 2016-12-19 ENCOUNTER — Non-Acute Institutional Stay (SKILLED_NURSING_FACILITY): Payer: PPO | Admitting: Internal Medicine

## 2016-12-19 ENCOUNTER — Encounter: Payer: Self-pay | Admitting: Internal Medicine

## 2016-12-19 DIAGNOSIS — R319 Hematuria, unspecified: Secondary | ICD-10-CM

## 2016-12-19 DIAGNOSIS — I420 Dilated cardiomyopathy: Secondary | ICD-10-CM

## 2016-12-19 DIAGNOSIS — R748 Abnormal levels of other serum enzymes: Secondary | ICD-10-CM

## 2016-12-19 DIAGNOSIS — I63012 Cerebral infarction due to thrombosis of left vertebral artery: Secondary | ICD-10-CM

## 2016-12-19 DIAGNOSIS — I10 Essential (primary) hypertension: Secondary | ICD-10-CM | POA: Diagnosis not present

## 2016-12-19 DIAGNOSIS — N390 Urinary tract infection, site not specified: Secondary | ICD-10-CM | POA: Diagnosis not present

## 2016-12-19 NOTE — Assessment & Plan Note (Signed)
12/19/16 no clinical signs of decompensated congestive heart failure

## 2016-12-19 NOTE — Assessment & Plan Note (Signed)
Resolved despite continuation of the statin

## 2016-12-19 NOTE — Progress Notes (Signed)
[facility] Room: [address]  PCP Luiz Blare, DO [place] [address]  This is a comprehensive admission note to [facility] performed on this date less than 30 days from date of admission. Included are preadmission medical/surgical history;reconciled medication list; family history; social history and comprehensive review of systems.  Corrections and additions to the records were documented . Comprehensive physical exam was also performed. Additionally a clinical summary was entered for each active diagnosis pertinent to this admission in the Problem List to enhance continuity of care.   HPI: The patient was hospitalized 2/23-2/28/18 with altered mental status. MRI revealed an acute ischemic pontine CVA. Hospitalization was complicated by urinary tract infection. Presentation was weakness and altered mental status after mechanical fall. He slipped in water, but he was too weak to rise.  He exhibited slowed speech, intermittent dysarthria, expressive aphasia and right-sided hemiparesis. CT revealed atrophy with no localized findings. MRI revealed acute ischemic nonhemorrhagic pontine infarct. Multiple older infarcts were also seen. Abnormal left vertebral artery flow was documented; CT angio of the neck revealed occluded left vertebral artery.. Neurology recommended full dose aspirin and Plavix 3 months then Plavix alone. Dysphagia 2 diet was recommended Ejection fraction was 45-50 percent with grade 1 diastolic dysfunction. There was diffuse hypokinesis. The Coreg  was decreased to 6.25 g twice a day because of bradycardia in the 60s. He previously had been on 25 mg twice a day. CK was elevated to 1044 but had trended to normal with IV fluids. He is on 80 mg of atorvastatin. Urine cultures reviewed greater than 100,000 colonies of coagulase-negative staph which was pansensitive. At the time of discharge he was to complete a course of Cipro  through 3/3. Diabetes was well-controlled on 5 units of Lantus while hospitalized ;glucoses remained in the 100s. He was on thousand milligrams of metformin bid.Renal function was normal. Discharge to SNF for PT/OT was recommended. Blood pressure earlier today was 148/60. After receiving his blood pressure medicines he was not verbally interactive and unable to ambulate. Blood pressure was found to be 90/50.  Past medical and surgical history: Includes a history of lupus without any documentation.  Social history: Reviewed  Family history: Reviewed  Review of systems:Could not be completed due to expressive aphasia.  Physical exam:  Pertinent or positive findings: He was aphasic. He has a few remaining mandibular teeth. The maxillary is edentulous..Tongue is coated. He has a Engineering geologist. There is variability in the extremity weakness. He appears slightly weaker on the right than the left.  Follows commands with delay.  Lungs diminished throughout.   General appearance:Adequately nourished; no acute distress , increased work of breathing is present.   Lymphatic: No lymphadenopathy about the head, neck, axilla . Eyes: No conjunctival inflammation or lid edema is present. There is no scleral icterus. Ears:  External ear exam shows no significant lesions or deformities.   Nose:  External nasal examination shows no deformity or inflammation. Nasal mucosa are pink and moist without lesions ,exudates Oral exam: lips and gums are healthy appearing. Neck:  No thyromegaly, masses, tenderness noted.    Heart:  Normal rate and regular rhythm. S1 and S2 normal without gallop, murmur, click, rub .  Lungs:without wheezes, rhonchi,rales , rubs. Abdomen:Bowel sounds are normal. Abdomen is obese, soft, and nontender with no organomegaly, hernias,masses. GU: deferred . Extremities:  No cyanosis, clubbing,edema  Neurologic exam : Balance,Rhomberg could not be completed due to clinical state Skin: Warm &  dry  w/o tenting. No significant lesions or rash.  See clinical summary under each active problem in the Problem List with associated updated therapeutic plan

## 2016-12-19 NOTE — Assessment & Plan Note (Signed)
Continue Cipro through 12/21/16

## 2016-12-19 NOTE — Patient Instructions (Signed)
Plan: See recommendations under each diagnosis

## 2016-12-19 NOTE — Assessment & Plan Note (Addendum)
PT/OT Dual therapy with full dose aspirin and Plavix for 3 months and then Plavix thereafter Neurology follow-up in 6 weeks 12/19/16 recurrent aphasia in context of low blood pressure. Coreg dose will be decreased

## 2016-12-19 NOTE — Assessment & Plan Note (Signed)
Blood pressure 90/50 ; Coreg dose decreased

## 2016-12-20 ENCOUNTER — Encounter: Payer: Self-pay | Admitting: Nurse Practitioner

## 2016-12-20 ENCOUNTER — Encounter (HOSPITAL_COMMUNITY): Payer: Self-pay

## 2016-12-20 ENCOUNTER — Inpatient Hospital Stay (HOSPITAL_COMMUNITY)
Admission: EM | Admit: 2016-12-20 | Discharge: 2016-12-31 | DRG: 065 | Disposition: A | Discharge status: Skilled Nursing Facility | Payer: PPO | Attending: Family Medicine | Admitting: Family Medicine

## 2016-12-20 ENCOUNTER — Non-Acute Institutional Stay (SKILLED_NURSING_FACILITY): Payer: PPO | Admitting: Nurse Practitioner

## 2016-12-20 DIAGNOSIS — I672 Cerebral atherosclerosis: Secondary | ICD-10-CM | POA: Diagnosis present

## 2016-12-20 DIAGNOSIS — E1140 Type 2 diabetes mellitus with diabetic neuropathy, unspecified: Secondary | ICD-10-CM | POA: Diagnosis not present

## 2016-12-20 DIAGNOSIS — R4701 Aphasia: Secondary | ICD-10-CM | POA: Diagnosis not present

## 2016-12-20 DIAGNOSIS — I420 Dilated cardiomyopathy: Secondary | ICD-10-CM | POA: Diagnosis not present

## 2016-12-20 DIAGNOSIS — R627 Adult failure to thrive: Secondary | ICD-10-CM | POA: Diagnosis not present

## 2016-12-20 DIAGNOSIS — E785 Hyperlipidemia, unspecified: Secondary | ICD-10-CM | POA: Diagnosis present

## 2016-12-20 DIAGNOSIS — E663 Overweight: Secondary | ICD-10-CM | POA: Diagnosis not present

## 2016-12-20 DIAGNOSIS — I2510 Atherosclerotic heart disease of native coronary artery without angina pectoris: Secondary | ICD-10-CM | POA: Diagnosis not present

## 2016-12-20 DIAGNOSIS — E872 Acidosis: Secondary | ICD-10-CM | POA: Diagnosis present

## 2016-12-20 DIAGNOSIS — I5022 Chronic systolic (congestive) heart failure: Secondary | ICD-10-CM | POA: Diagnosis not present

## 2016-12-20 DIAGNOSIS — Z7982 Long term (current) use of aspirin: Secondary | ICD-10-CM

## 2016-12-20 DIAGNOSIS — N390 Urinary tract infection, site not specified: Secondary | ICD-10-CM | POA: Diagnosis present

## 2016-12-20 DIAGNOSIS — Z7902 Long term (current) use of antithrombotics/antiplatelets: Secondary | ICD-10-CM

## 2016-12-20 DIAGNOSIS — Z823 Family history of stroke: Secondary | ICD-10-CM

## 2016-12-20 DIAGNOSIS — R1312 Dysphagia, oropharyngeal phase: Secondary | ICD-10-CM

## 2016-12-20 DIAGNOSIS — I69391 Dysphagia following cerebral infarction: Secondary | ICD-10-CM | POA: Diagnosis not present

## 2016-12-20 DIAGNOSIS — M6281 Muscle weakness (generalized): Secondary | ICD-10-CM | POA: Diagnosis not present

## 2016-12-20 DIAGNOSIS — E860 Dehydration: Secondary | ICD-10-CM

## 2016-12-20 DIAGNOSIS — Z431 Encounter for attention to gastrostomy: Secondary | ICD-10-CM | POA: Diagnosis not present

## 2016-12-20 DIAGNOSIS — I6502 Occlusion and stenosis of left vertebral artery: Secondary | ICD-10-CM | POA: Diagnosis present

## 2016-12-20 DIAGNOSIS — I69322 Dysarthria following cerebral infarction: Secondary | ICD-10-CM | POA: Diagnosis not present

## 2016-12-20 DIAGNOSIS — I110 Hypertensive heart disease with heart failure: Secondary | ICD-10-CM | POA: Diagnosis not present

## 2016-12-20 DIAGNOSIS — Z794 Long term (current) use of insulin: Secondary | ICD-10-CM

## 2016-12-20 DIAGNOSIS — I69320 Aphasia following cerebral infarction: Secondary | ICD-10-CM

## 2016-12-20 DIAGNOSIS — I6930 Unspecified sequelae of cerebral infarction: Secondary | ICD-10-CM | POA: Diagnosis not present

## 2016-12-20 DIAGNOSIS — I69351 Hemiplegia and hemiparesis following cerebral infarction affecting right dominant side: Secondary | ICD-10-CM | POA: Diagnosis not present

## 2016-12-20 DIAGNOSIS — I63012 Cerebral infarction due to thrombosis of left vertebral artery: Secondary | ICD-10-CM | POA: Diagnosis not present

## 2016-12-20 DIAGNOSIS — F1010 Alcohol abuse, uncomplicated: Secondary | ICD-10-CM | POA: Diagnosis present

## 2016-12-20 DIAGNOSIS — Z6829 Body mass index (BMI) 29.0-29.9, adult: Secondary | ICD-10-CM | POA: Diagnosis not present

## 2016-12-20 DIAGNOSIS — I638 Other cerebral infarction: Principal | ICD-10-CM | POA: Diagnosis present

## 2016-12-20 DIAGNOSIS — N179 Acute kidney failure, unspecified: Secondary | ICD-10-CM | POA: Diagnosis present

## 2016-12-20 DIAGNOSIS — I639 Cerebral infarction, unspecified: Secondary | ICD-10-CM | POA: Diagnosis not present

## 2016-12-20 DIAGNOSIS — Z811 Family history of alcohol abuse and dependence: Secondary | ICD-10-CM

## 2016-12-20 DIAGNOSIS — R4182 Altered mental status, unspecified: Secondary | ICD-10-CM | POA: Diagnosis not present

## 2016-12-20 DIAGNOSIS — M79605 Pain in left leg: Secondary | ICD-10-CM

## 2016-12-20 DIAGNOSIS — R7989 Other specified abnormal findings of blood chemistry: Secondary | ICD-10-CM | POA: Diagnosis present

## 2016-12-20 DIAGNOSIS — R4702 Dysphasia: Secondary | ICD-10-CM | POA: Diagnosis not present

## 2016-12-20 DIAGNOSIS — R1310 Dysphagia, unspecified: Secondary | ICD-10-CM | POA: Diagnosis not present

## 2016-12-20 DIAGNOSIS — H409 Unspecified glaucoma: Secondary | ICD-10-CM | POA: Diagnosis not present

## 2016-12-20 DIAGNOSIS — E875 Hyperkalemia: Secondary | ICD-10-CM

## 2016-12-20 DIAGNOSIS — Z4682 Encounter for fitting and adjustment of non-vascular catheter: Secondary | ICD-10-CM | POA: Diagnosis not present

## 2016-12-20 DIAGNOSIS — Z79899 Other long term (current) drug therapy: Secondary | ICD-10-CM

## 2016-12-20 DIAGNOSIS — I10 Essential (primary) hypertension: Secondary | ICD-10-CM | POA: Diagnosis not present

## 2016-12-20 DIAGNOSIS — Z87891 Personal history of nicotine dependence: Secondary | ICD-10-CM | POA: Diagnosis not present

## 2016-12-20 DIAGNOSIS — B958 Unspecified staphylococcus as the cause of diseases classified elsewhere: Secondary | ICD-10-CM | POA: Diagnosis not present

## 2016-12-20 DIAGNOSIS — R6251 Failure to thrive (child): Secondary | ICD-10-CM | POA: Diagnosis present

## 2016-12-20 DIAGNOSIS — R001 Bradycardia, unspecified: Secondary | ICD-10-CM | POA: Diagnosis present

## 2016-12-20 DIAGNOSIS — I69319 Unspecified symptoms and signs involving cognitive functions following cerebral infarction: Secondary | ICD-10-CM | POA: Diagnosis present

## 2016-12-20 DIAGNOSIS — R488 Other symbolic dysfunctions: Secondary | ICD-10-CM | POA: Diagnosis not present

## 2016-12-20 DIAGNOSIS — Z87448 Personal history of other diseases of urinary system: Secondary | ICD-10-CM

## 2016-12-20 DIAGNOSIS — Z808 Family history of malignant neoplasm of other organs or systems: Secondary | ICD-10-CM

## 2016-12-20 DIAGNOSIS — Z0189 Encounter for other specified special examinations: Secondary | ICD-10-CM

## 2016-12-20 HISTORY — DX: Personal history of other diseases of urinary system: Z87.448

## 2016-12-20 LAB — CBC WITH DIFFERENTIAL/PLATELET
Basophils Absolute: 0 10*3/uL (ref 0.0–0.1)
Basophils Relative: 0 %
EOS PCT: 1 %
Eosinophils Absolute: 0.1 10*3/uL (ref 0.0–0.7)
HEMATOCRIT: 42.5 % (ref 39.0–52.0)
HEMOGLOBIN: 14.2 g/dL (ref 13.0–17.0)
LYMPHS ABS: 1.6 10*3/uL (ref 0.7–4.0)
LYMPHS PCT: 21 %
MCH: 30.4 pg (ref 26.0–34.0)
MCHC: 33.4 g/dL (ref 30.0–36.0)
MCV: 91 fL (ref 78.0–100.0)
Monocytes Absolute: 0.8 10*3/uL (ref 0.1–1.0)
Monocytes Relative: 11 %
NEUTROS ABS: 5 10*3/uL (ref 1.7–7.7)
Neutrophils Relative %: 67 %
PLATELETS: 359 10*3/uL (ref 150–400)
RBC: 4.67 MIL/uL (ref 4.22–5.81)
RDW: 14 % (ref 11.5–15.5)
WBC: 7.4 10*3/uL (ref 4.0–10.5)

## 2016-12-20 LAB — URINALYSIS, ROUTINE W REFLEX MICROSCOPIC
Bilirubin Urine: NEGATIVE
Glucose, UA: NEGATIVE mg/dL
Ketones, ur: 5 mg/dL — AB
Leukocytes, UA: NEGATIVE
Nitrite: NEGATIVE
PROTEIN: NEGATIVE mg/dL
RBC / HPF: NONE SEEN RBC/hpf (ref 0–5)
SPECIFIC GRAVITY, URINE: 1.016 (ref 1.005–1.030)
pH: 5 (ref 5.0–8.0)

## 2016-12-20 LAB — I-STAT CG4 LACTIC ACID, ED: LACTIC ACID, VENOUS: 2.83 mmol/L — AB (ref 0.5–1.9)

## 2016-12-20 LAB — COMPREHENSIVE METABOLIC PANEL
ALK PHOS: 79 U/L (ref 38–126)
ALT: 94 U/L — AB (ref 17–63)
AST: 50 U/L — ABNORMAL HIGH (ref 15–41)
Albumin: 3.9 g/dL (ref 3.5–5.0)
Anion gap: 13 (ref 5–15)
BILIRUBIN TOTAL: 0.7 mg/dL (ref 0.3–1.2)
BUN: 35 mg/dL — ABNORMAL HIGH (ref 6–20)
CALCIUM: 9.4 mg/dL (ref 8.9–10.3)
CO2: 22 mmol/L (ref 22–32)
CREATININE: 2.44 mg/dL — AB (ref 0.61–1.24)
Chloride: 104 mmol/L (ref 101–111)
GFR, EST AFRICAN AMERICAN: 30 mL/min — AB (ref >60)
GFR, EST NON AFRICAN AMERICAN: 26 mL/min — AB (ref >60)
Glucose, Bld: 93 mg/dL (ref 65–99)
Potassium: 5.1 mmol/L (ref 3.5–5.1)
Sodium: 139 mmol/L (ref 135–145)
TOTAL PROTEIN: 7.4 g/dL (ref 6.5–8.1)

## 2016-12-20 LAB — CK: CK TOTAL: 82 U/L (ref 49–397)

## 2016-12-20 LAB — GLUCOSE, CAPILLARY: Glucose-Capillary: 91 mg/dL (ref 65–99)

## 2016-12-20 LAB — MAGNESIUM: MAGNESIUM: 2.2 mg/dL (ref 1.7–2.4)

## 2016-12-20 LAB — LACTIC ACID, PLASMA: LACTIC ACID, VENOUS: 1.9 mmol/L (ref 0.5–1.9)

## 2016-12-20 MED ORDER — ASPIRIN 325 MG PO TABS
325.0000 mg | ORAL_TABLET | Freq: Every day | ORAL | Status: DC
  Filled 2016-12-20: qty 1

## 2016-12-20 MED ORDER — TRAVOPROST (BAK FREE) 0.004 % OP SOLN
1.0000 [drp] | Freq: Every day | OPHTHALMIC | Status: DC
  Administered 2016-12-21 – 2016-12-30 (×10): 1 [drp] via OPHTHALMIC
  Filled 2016-12-20: qty 2.5

## 2016-12-20 MED ORDER — ATORVASTATIN CALCIUM 80 MG PO TABS
80.0000 mg | ORAL_TABLET | Freq: Every day | ORAL | Status: DC
  Administered 2016-12-21 – 2016-12-26 (×3): 80 mg via ORAL
  Filled 2016-12-20 (×5): qty 1

## 2016-12-20 MED ORDER — ISOSORBIDE MONONITRATE ER 30 MG PO TB24
30.0000 mg | ORAL_TABLET | Freq: Every day | ORAL | Status: DC
  Filled 2016-12-20: qty 1

## 2016-12-20 MED ORDER — CARVEDILOL 3.125 MG PO TABS
3.1250 mg | ORAL_TABLET | Freq: Two times a day (BID) | ORAL | Status: DC
  Filled 2016-12-20: qty 1

## 2016-12-20 MED ORDER — GABAPENTIN 300 MG PO CAPS
300.0000 mg | ORAL_CAPSULE | Freq: Every day | ORAL | Status: DC
  Administered 2016-12-21 – 2016-12-26 (×3): 300 mg via ORAL
  Filled 2016-12-20 (×5): qty 1

## 2016-12-20 MED ORDER — SODIUM CHLORIDE 0.9 % IV SOLN
INTRAVENOUS | Status: DC
  Administered 2016-12-21: 01:00:00 via INTRAVENOUS

## 2016-12-20 MED ORDER — CIPROFLOXACIN HCL 500 MG PO TABS
250.0000 mg | ORAL_TABLET | Freq: Two times a day (BID) | ORAL | Status: DC
  Administered 2016-12-21: 250 mg via ORAL
  Filled 2016-12-20 (×2): qty 1

## 2016-12-20 MED ORDER — ADULT MULTIVITAMIN W/MINERALS CH
1.0000 | ORAL_TABLET | Freq: Every day | ORAL | Status: DC
  Administered 2016-12-26 – 2016-12-27 (×2): 1 via ORAL
  Filled 2016-12-20 (×4): qty 1

## 2016-12-20 MED ORDER — ENOXAPARIN SODIUM 30 MG/0.3ML ~~LOC~~ SOLN
30.0000 mg | SUBCUTANEOUS | Status: DC
  Administered 2016-12-21: 30 mg via SUBCUTANEOUS
  Filled 2016-12-20: qty 0.3

## 2016-12-20 MED ORDER — INSULIN GLARGINE 100 UNIT/ML ~~LOC~~ SOLN
5.0000 [IU] | Freq: Every day | SUBCUTANEOUS | Status: DC
  Administered 2016-12-21: 5 [IU] via SUBCUTANEOUS
  Filled 2016-12-20 (×3): qty 0.05

## 2016-12-20 MED ORDER — CLOPIDOGREL BISULFATE 75 MG PO TABS
75.0000 mg | ORAL_TABLET | Freq: Every day | ORAL | Status: DC
  Filled 2016-12-20: qty 1

## 2016-12-20 MED ORDER — SODIUM CHLORIDE 0.9 % IV SOLN: INTRAVENOUS | Status: DC

## 2016-12-20 MED ORDER — SODIUM CHLORIDE 0.9 % IV BOLUS (SEPSIS)
1000.0000 mL | Freq: Once | INTRAVENOUS | Status: AC
  Administered 2016-12-20: 1000 mL via INTRAVENOUS

## 2016-12-20 MED ORDER — VITAMIN B-1 100 MG PO TABS
100.0000 mg | ORAL_TABLET | Freq: Every day | ORAL | Status: DC
  Administered 2016-12-26 – 2016-12-27 (×2): 100 mg via ORAL
  Filled 2016-12-20 (×4): qty 1

## 2016-12-20 MED ORDER — THIAMINE HCL 100 MG/ML IJ SOLN: 100.0000 mg | Freq: Every day | INTRAMUSCULAR | Status: DC

## 2016-12-20 MED ORDER — POLYETHYLENE GLYCOL 3350 17 G PO PACK: 17.0000 g | PACK | Freq: Every day | ORAL | Status: DC | PRN

## 2016-12-20 MED ORDER — FOLIC ACID 1 MG PO TABS
1.0000 mg | ORAL_TABLET | Freq: Every day | ORAL | Status: DC
  Administered 2016-12-26 – 2016-12-27 (×2): 1 mg via ORAL
  Filled 2016-12-20 (×4): qty 1

## 2016-12-20 NOTE — ED Notes (Signed)
RN attempted IV unsuccessfully. Will have another RN attempt.  

## 2016-12-20 NOTE — ED Triage Notes (Signed)
Pt. Coming from [facility] for not eating or drinking x48 hours and possibly needing PEG tube. Pt. Stroke 12/13/2016 and non-verbal and not ambulatory since. Pt. Note from provider states 'not swallowing'. Pt. Not drooling at this time and able to swallow saliva. Pt. Able to follow simple commands. No DNR or MOST form.

## 2016-12-20 NOTE — ED Provider Notes (Addendum)
[facility] DEPT Provider Note   CSN: [id number] Arrival date & time: 12/20/16  1502     History   Chief Complaint Chief Complaint  Patient presents with  . Failure To Thrive    HPI Blake Wilson is a 66 y.o. male.  Patient is a 17 rolled male with a history of diabetes, hypertension, lupus, CHF, recent hospitalization for acute ischemic stroke and UTI with altered mental status he was sent [facility]. He was having some dysphasia prior to discharge and diet was adjusted. However for the last 2 days patient has has not eaten or drank and he indicates it's due to not being able to swallow. Patient also refuses to speak. Patient has not taken medication in the last few days because he is unable to swallow his pills. Patient is able to answer all questions with nodding his head. He denies any throat pain or mouth pain, chest pain, shortness of breath or abdominal pain. He denies vomiting or fever. He is able to follow commands. The rehabilitation facility sent the patient here for evaluation of her failure to thrive and for possible PEG tube placement and fluids. When asked patient if he was hungry or thirsty he shook his head no.  When asking patient to speak he refuses to try   The history is provided by the EMS personnel and the nursing home.    Past Medical History:  Diagnosis Date  . CHF (congestive heart failure) ([place])   . Diabetes mellitus   . Hyperlipidemia   . Lupus   . Mild CAD 2009    Patient Active Problem List   Diagnosis Date Noted  . Abnormal EKG 12/16/2016  . Aphasia 12/16/2016  . CVA (cerebral vascular accident) ([place])   . Urinary tract infection with hematuria   . Elevated CK   . Low TSH level 06/13/2014  . Chronic systolic heart failure ([place]) 04/01/2014  . Pain in joint, ankle and foot 04/01/2014  . Pain in lower limb 12/22/2013  . Metatarsalgia of both feet 12/22/2013  . Equinus deformity of foot, acquired 12/22/2013  .  Onychomycosis 12/22/2013  . Diabetic neuropathy, painful ([place]) 12/22/2013  . Discomfort of chest wall 10/26/2013  . Sleeping difficulty 10/07/2012  . Left leg pain 10/29/2011  . GOUT, UNSPECIFIED 06/26/2010  . GLAUCOMA 04/17/2009  . Elevated lipids 03/02/2009  . CAD 03/02/2009  . OBESITY 01/17/2009  . ALCOHOL ABUSE 01/09/2009  . Congestive dilated cardiomyopathy ([place]) 01/09/2009  . HYPERTENSION, BENIGN SYSTEMIC 12/18/2006  . SLE 12/18/2006    Past Surgical History:  Procedure Laterality Date  . TEE WITHOUT CARDIOVERSION N/A 12/17/2016   Procedure: TRANSESOPHAGEAL ECHOCARDIOGRAM (TEE);  Surgeon: Skeet Latch, MD;  Location: [facility] ENDOSCOPY;  Service: Cardiovascular;  Laterality: N/A;       Home Medications    Prior to Admission medications   Medication Sig Start Date End Date Taking? Authorizing Provider  aspirin 325 MG tablet Take 325 mg by mouth daily.    Historical Provider, MD  atorvastatin (LIPITOR) 80 MG tablet Take 1 tablet (80 mg total) by mouth at bedtime. 10/29/11   Carolin Guernsey, MD  carvedilol (COREG) 3.125 MG tablet Take 3.125 mg by mouth 2 (two) times daily with a meal.    Historical Provider, MD  ciprofloxacin (CIPRO) 250 MG tablet Take 250 mg by mouth 2 (two) times daily. Stop date: 12/21/16    Historical Provider, MD  clopidogrel (PLAVIX) 75 MG tablet Take 1 tablet (75 mg total) by mouth  daily. 12/19/16   Everrett Coombe, MD  furosemide (LASIX) 40 MG tablet Take 1 tablet (40 mg total) by mouth daily. 01/26/16   Smiley Houseman, MD  gabapentin (NEURONTIN) 300 MG capsule Take 1-3 tablets qhs as needed for lower extremity pain. 03/05/13   Carolin Guernsey, MD  insulin glargine (LANTUS) 100 UNIT/ML injection Inject 0.05 mLs (5 Units total) into the skin at bedtime. 12/18/16   Everrett Coombe, MD  isosorbide mononitrate (IMDUR) 30 MG 24 hr tablet Take 1 tablet (30 mg total) by mouth daily. 01/26/16   Smiley Houseman, MD  metFORMIN (GLUCOPHAGE) 1000 MG tablet Take 1 tablet  (1,000 mg total) by mouth 2 (two) times daily with a meal. 12/02/16   Katheren Shams, DO  nitroGLYCERIN (NITROSTAT) 0.4 MG SL tablet Place 1 tablet (0.4 mg total) under the tongue every 5 (five) minutes as needed for chest pain. 01/26/16   Smiley Houseman, MD  sacubitril-valsartan (ENTRESTO) 97-103 MG Take 1 tablet by mouth 2 (two) times daily.    Historical Provider, MD  travoprost, benzalkonium, (TRAVATAN) 0.004 % ophthalmic solution Place 1 drop into both eyes at bedtime.     Historical Provider, MD    Family History Family History  Problem Relation Age of Onset  . Cirrhosis Brother   . Alcohol abuse Brother   . Stroke Maternal Grandmother   . Cancer Brother     throat cancer (smoker)    Social History Social History  Substance Use Topics  . Smoking status: Former Smoker    Quit date: 01/23/2001  . Smokeless tobacco: Former Systems developer  . Alcohol use 0.6 oz/week    1 Cans of beer per week     Allergies   Patient has no known allergies.   Review of Systems Review of Systems  All other systems reviewed and are negative.    Physical Exam Updated Vital Signs BP 116/56 (BP Location: Right Arm)   Pulse (!) 59   Temp 98.2 F (36.8 C)   Resp 15   Ht 6' (1.829 m)   Wt 218 lb (98.9 kg)   SpO2 100%   BMI 29.57 kg/m   Physical Exam  Constitutional: He appears well-developed and well-nourished. No distress.  HENT:  Head: Normocephalic and atraumatic.  Thick white exudative coat over the time with normal appearing pharynx  Eyes: Conjunctivae and EOM are normal. Pupils are equal, round, and reactive to light.  Neck: Normal range of motion. Neck supple.  No masses palpated  Cardiovascular: Normal rate, regular rhythm and intact distal pulses.   No murmur heard. Pulmonary/Chest: Effort normal and breath sounds normal. No respiratory distress. He has no wheezes. He has no rales.  Abdominal: Soft. He exhibits no distension. There is no tenderness. There is no rebound and no  guarding.  Musculoskeletal: Normal range of motion. He exhibits no edema or tenderness.  Lymphadenopathy:    He has no cervical adenopathy.  Neurological: He is alert. A sensory deficit is present.  Patient is able to nod yes or no but refuses to try to speak. Unclear why patient will not speak. He has a history of expressive aphasia but he will not even attempt to form words.  Weight decreased strength in the right upper and lower extremity but is able to move them on command. Sensation is intact.  Skin: Skin is warm and dry. No rash noted. No erythema.  Psychiatric: He has a normal mood and affect. His behavior is normal.  Nursing  note and vitals reviewed.    ED Treatments / Results  Labs (all labs ordered are listed, but only abnormal results are displayed) Labs Reviewed  COMPREHENSIVE METABOLIC PANEL - Abnormal; Notable for the following:       Result Value   BUN 35 (*)    Creatinine, Ser 2.44 (*)    AST 50 (*)    ALT 94 (*)    GFR calc non Af Amer 26 (*)    GFR calc Af Amer 30 (*)    All other components within normal limits  I-STAT CG4 LACTIC ACID, ED - Abnormal; Notable for the following:    Lactic Acid, Venous 2.83 (*)    All other components within normal limits  CBC WITH DIFFERENTIAL/PLATELET  MAGNESIUM  URINALYSIS, ROUTINE W REFLEX MICROSCOPIC    EKG  EKG Interpretation  Date/Time:  Friday December 20 2016 17:06:09 EST Ventricular Rate:  60 PR Interval:    QRS Duration: 116 QT Interval:  478 QTC Calculation: 478 R Axis:   -33 Text Interpretation:  Sinus rhythm Atrial premature complex Sinus pause Incomplete left bundle branch block Left ventricular hypertrophy Anterior Q waves, possibly due to LVH Baseline wander in lead(s) II III aVF No significant change since last tracing Confirmed by Maryan Rued  MD, Loree Fee ([id number]) on 12/20/2016 5:50:58 PM       Radiology No results found.  Procedures Procedures (including critical care time)  Medications Ordered in  ED Medications  sodium chloride 0.9 % bolus 1,000 mL (not administered)     Initial Impression / Assessment and Plan / ED Course  I have reviewed the triage vital signs and the nursing notes.  Pertinent labs & imaging results that were available during my care of the patient were reviewed by me and considered in my medical decision making (see chart for details).     Patient is a 66 year old male presenting today with failure to thrive as he is refusing to eat or drink anything. Patient has a thick white coat on his tongue which could be thrush however he is also refusing to even attempt to speak.  Patient appears to be in no acute distress and does not appear altered and is able to answer questions by nodding his head appropriately. Vital signs are within normal limits however at the nursing facility he was hypotensive. I do not believe that he is received any fluids at the nursing home. Based on their note he is getting increasingly weak since discharge on 12/18/2016.  They were concern for dehydration also possibility for potential PEG tube as patient may have dysphagia related to his recent stroke. CBC, CMP, magnesium, UA and EKG pending. Patient will be given IV fluids.  7:44 PM Patient with new AK with a creatinine of 2.44 from a baseline of less than 1 and elevated lactate of 2.8. This is most likely because he is refusing to take anything by mouth. Will admit for IV fluids and further evaluation whether patient needs a PEG tube.  Final Clinical Impressions(s) / ED Diagnoses   Final diagnoses:  Dehydration  AKI (acute kidney injury) ([place])  Failure to thrive in adult    New Prescriptions New Prescriptions   No medications on file     Blanchie Dessert, MD 12/20/16 [place], MD 12/20/16 2056

## 2016-12-20 NOTE — ED Notes (Signed)
2nd RN attempted unsuccessfully. IV team consulted. EDP made aware.

## 2016-12-20 NOTE — H&P (Signed)
[facility] Admission History and Physical Service Pager: [phone]  Patient name: Blake Wilson Medical record number: [id number] Date of birth: 1951/05/17 Age: 66 y.o. Gender: male  Primary Care Provider: Luiz Blare, DO Consultants: None  Code Status: FULL (confirmed at admission)  Chief Complaint: Poor PO intake   Assessment and Plan: Blake Wilson is a 67 y.o. male presenting from [place] facility for no PO intake since discharge found to have an AKI. PMH is significant for CAD, HFrEF, dilated cardiomyopathy, T2DM with neuropathy, alcohol abuse, and recent hospitalization for CVA/UTI/rhabdomyolysis.    AKI: Cr at admission 2.44 (bl 0.8-1.0). Suspect pre-renal physiology given history of no PO intake x2 days. Has received 2L NS bolus in ED.  -admit to MedSurg, vital signs per unit  -hydration overnight with NS at 125 cc/hr  -repeat Cr in AM  -hold entresto, metformin, and lasix  -avoid nephrotoxic agents  -check CK given history of recent rhabdo and elevated LFTs   Failure to Thrive: Patient without any PO intake since discharge on 2/28. Evaluated by SLP at prior hospitalization. MBS performed and deemed patient moderate aspiration risk. Patient was discharged with a Dysphagia 2 diet. Patient was brought from [facility] for evaluation for PEG tube.  -Dysphagia II diet ordered  -IVFs as above  -SLP consult  -consider IR consult for evaluation of PEG pending SLP recs   Recent CVA: Acute ischemic nonhemorrhagic pontine infarct without mass effect and multiple chronic infarcts. Residual deficits of dysarthria, expressive aphasia, and right sided weakness.  -continue ASA, Plavix, and Lipitor  -PT/OT consult  -SW consult for anticipated return to SNF at d/c   Lactic Acidosis: Lactic acid elevated to 2.83 at admission. Likely secondary to poor perfusion in setting of poor PO intake complicated by metformin use. Patient almost at end of  treatment for UTI. Denies infectious symptoms.  -trend lactic acids -IVF resuscitation  -hold metformin   Feeling of Sadness: Patient did nod yes when asked if he felt down or sad. Some of his reluctance to eat may be due to sadness over recent CVA and deficits.  -evaluate further in AM and consider starting anti-depressant   UTI: Patient denies symptoms. UA now without WBCs, RBCs, and leukocytes. Small Hgb remains. Patient due to complete course of antibiotics 3/3. -Ciprofloxacin x3 doses to complete abx course   Elevated LFTs: AST 50 and ALT 94, previously 27 and 19 respectively. Patient with recent hospital admission complicated by rhabdo. Patient is taking statin.  -repeat CMET in AM  -check CK  -may need to hold statin   HFrEF: TEE at last admission with LVEF 40-45%, diffuse hypokinesis, mild aortic and mitral regurgitation. Coreg decreased from 25 mg BID to 6.25 mg BID at last hospitalization due to bradycardia. HR in upper 50s initially but improved to 70. HR stable at [facility] yesterday. No signs of volume overload on exam, in fact patient appears clinically dehydrated.  -hold Entresto and Lasix due to AKI  -continue Coreg and Imdur  -may need to decrease Coreg dose further if bradycardic   -monitor volume status with IVFs   T2DM: Lantus dose decreased to 5u at recent hospitalization. Glucose 93 on BMET at admission.  -continue Lantus  -CBG AC/HS  -add SSI if CBGs elevated  -hold metformin   Alcohol Abuse: Patient denies EtOH use. However, from chart review of recent admission patient has history of alcohol use and most recent drink was reported to be 2/21.  -monitor CIWA scores  -  thiamine, folate, MVI    FEN/GI:  NS at 125 cc/hr, Dysphagia II diet  Prophylaxis: Lovenox   Disposition: Admit to FPTS; Attending [place]   History of Present Illness:  Blake Wilson is a 66 y.o. male presenting from [facility] for failure to thrive. Patient reportedly  without PO intake for 2 days. Did not receive IVFs at SNF. Patient recently admitted with CVA and has residual expressive aphasia. He attempts to answer some questions but speech is not clear and he mostly nods yes or no during history taking.   Patient denies pain with swallowing. Patient denies mouth or throat pain. He admits he has not eaten or drank but will not attempt to explain why. Denies difficulty swallowing his secretions. Denies abdominal pain, chest pain, and SOB. Denies nausea and vomiting.   Review Of Systems: Per HPI with the following additions: Per HPI   ROS  Patient Active Problem List   Diagnosis Date Noted  . Failure to thrive (0-17) 12/20/2016  . Abnormal EKG 12/16/2016  . Aphasia 12/16/2016  . CVA (cerebral vascular accident) ([place])   . Urinary tract infection with hematuria   . Elevated CK   . Low TSH level 06/13/2014  . Chronic systolic heart failure ([place]) 04/01/2014  . Pain in joint, ankle and foot 04/01/2014  . Pain in lower limb 12/22/2013  . Metatarsalgia of both feet 12/22/2013  . Equinus deformity of foot, acquired 12/22/2013  . Onychomycosis 12/22/2013  . Diabetic neuropathy, painful ([place]) 12/22/2013  . Discomfort of chest wall 10/26/2013  . Sleeping difficulty 10/07/2012  . Left leg pain 10/29/2011  . GOUT, UNSPECIFIED 06/26/2010  . GLAUCOMA 04/17/2009  . Elevated lipids 03/02/2009  . CAD 03/02/2009  . OBESITY 01/17/2009  . ALCOHOL ABUSE 01/09/2009  . Congestive dilated cardiomyopathy ([place]) 01/09/2009  . HYPERTENSION, BENIGN SYSTEMIC 12/18/2006  . SLE 12/18/2006    Past Medical History: Past Medical History:  Diagnosis Date  . CHF (congestive heart failure) ([place])   . Diabetes mellitus   . Hyperlipidemia   . Lupus   . Mild CAD 2009    Past Surgical History: Past Surgical History:  Procedure Laterality Date  . TEE WITHOUT CARDIOVERSION N/A 12/17/2016   Procedure: TRANSESOPHAGEAL ECHOCARDIOGRAM (TEE);  Surgeon: Skeet Latch, MD;   Location: [facility] ENDOSCOPY;  Service: Cardiovascular;  Laterality: N/A;    Social History: Social History  Substance Use Topics  . Smoking status: Former Smoker    Quit date: 01/23/2001  . Smokeless tobacco: Former Systems developer  . Alcohol use 0.6 oz/week    1 Cans of beer per week   Additional social history: Denies tobacco, alcohol, and drug use.   Please also refer to relevant sections of EMR.  Family History: Family History  Problem Relation Age of Onset  . Cirrhosis Brother   . Alcohol abuse Brother   . Stroke Maternal Grandmother   . Cancer Brother     throat cancer (smoker)    Allergies and Medications: No Known Allergies No current facility-administered medications on file prior to encounter.    Current Outpatient Prescriptions on File Prior to Encounter  Medication Sig Dispense Refill  . aspirin 325 MG tablet Take 325 mg by mouth daily.    Marland Kitchen atorvastatin (LIPITOR) 80 MG tablet Take 1 tablet (80 mg total) by mouth at bedtime. 30 tablet 12  . carvedilol (COREG) 3.125 MG tablet Take 3.125 mg by mouth 2 (two) times daily with a meal.    . ciprofloxacin (CIPRO) 250 MG  tablet Take 250 mg by mouth 2 (two) times daily. Stop date: 12/21/16    . clopidogrel (PLAVIX) 75 MG tablet Take 1 tablet (75 mg total) by mouth daily. 30 tablet 0  . furosemide (LASIX) 40 MG tablet Take 1 tablet (40 mg total) by mouth daily. 15 tablet 0  . gabapentin (NEURONTIN) 300 MG capsule Take 1-3 tablets qhs as needed for lower extremity pain. (Patient taking differently: Take 300 mg by mouth at bedtime. Take 1-3 tablets qhs as needed for lower extremity pain.) 90 capsule 3  . insulin glargine (LANTUS) 100 UNIT/ML injection Inject 0.05 mLs (5 Units total) into the skin at bedtime. 10 mL 11  . isosorbide mononitrate (IMDUR) 30 MG 24 hr tablet Take 1 tablet (30 mg total) by mouth daily. 30 tablet 0  . metFORMIN (GLUCOPHAGE) 1000 MG tablet Take 1 tablet (1,000 mg total) by mouth 2 (two) times daily with a meal. 60 tablet  6  . nitroGLYCERIN (NITROSTAT) 0.4 MG SL tablet Place 1 tablet (0.4 mg total) under the tongue every 5 (five) minutes as needed for chest pain. 20 tablet 3  . sacubitril-valsartan (ENTRESTO) 97-103 MG Take 1 tablet by mouth 2 (two) times daily.    . travoprost, benzalkonium, (TRAVATAN) 0.004 % ophthalmic solution Place 1 drop into both eyes at bedtime.     . [DISCONTINUED] sitaGLIPtin (JANUVIA) 100 MG tablet Take 1 tablet (100 mg total) by mouth daily. 30 tablet 3    Objective: BP 133/57   Pulse (!) 57   Temp 98.2 F (36.8 C)   Resp 12   Ht 6' (1.829 m)   Wt 218 lb (98.9 kg)   SpO2 100%   BMI 29.57 kg/m  Exam: General: lying in bed in NAD  Eyes: EOMI, PERRL  ENTM: Thick white exudate covering over tongue. Lips and MMM dry. Poor dentition.  Neck: Full ROM. Supple.  Cardiovascular: RRR. No mumurs appreciated.  Respiratory: CTAB. No respiratory distress.  Gastrointestinal: soft, NTND, no rebound or guarding  MSK: Moves all extremities spontaneously and on command.  Derm: Skin is very dry. No rash noted.  Neuro: Expressive aphasia and slurred speech. Nods yes/no appropriately. Alert and oriented. Strength 4+/5 in right UE and LE. (consistent with documented exam at recent discharge) Strength 5/5 in left UE and right LE. Sensation intact in all extremities. CN II-XII grossly intact.  Psych: Normal mood and affect. Normal behavior.   Labs and Imaging: CBC BMET   Recent Labs Lab 12/20/16 1727  WBC 7.4  HGB 14.2  HCT 42.5  PLT 359    Recent Labs Lab 12/20/16 1727  NA 139  K 5.1  CL 104  CO2 22  BUN 35*  CREATININE 2.44*  GLUCOSE 93  CALCIUM 9.4      Nicolette Bang, DO 12/20/2016, 9:29 PM PGY-2, [place] Intern pager: [phone], text pages welcome

## 2016-12-20 NOTE — Progress Notes (Signed)
Nursing Home Location:    [facility]   Place of Service: SNF (31)  PCP: Luiz Blare, DO  No Known Allergies  Chief Complaint  Patient presents with  . Acute Visit    Functional decline     HPI:  Patient is a 66 y.o. male seen today at  [facility] today at the request of nursing and therapy. The patient was hospitalized 2/23-2/28/18 with altered mental status. MRI revealed an acute ischemic pontine CVA. Hospitalization was complicated by urinary tract infection. He exhibited slowed speech, intermittent dysarthria, expressive aphasia and right-sided hemiparesis. CT revealed atrophy with no localized findings. MRI revealed acute ischemic nonhemorrhagic pontine infarct. Multiple older infarcts were also seen. Abnormal left vertebral artery flow was documented; CT angio of the neck revealed occluded left vertebral artery.. Neurology recommended full dose aspirin and Plavix 3 months then Plavix alone. Dysphagia 2 diet was recommended.  ST at [facility] was concerned over pts swallowing. She called therapist at hospital who reported on his last day at hospital (2/28) swallowing had worsened so they downgraded his diet to dys 2 and sent him to [place]. Today ST reports he is unable to swallow despite max cues. He also would have some speech even though it was minimal and today he is unable to talk at all.  Pt is completed alert and able to follow commands. Knows the year,  when asked multiple choice nods appropriately.  Pt reports he was swallowing better at the hospital and has not been able to properly take a meal since he has been at [facility].  Gets very upset when questioned.  Nods no to pain when swallowing, pain in mouth (even though tongue is coated) No chest pains or shortness of breath.  Staff has also noted pt has become more weak since admission.  Review of Systems:  Review of Systems  Unable to perform ROS: Patient nonverbal    Past Medical History:  Diagnosis Date  . CHF  (congestive heart failure) ([place])   . Diabetes mellitus   . Hyperlipidemia   . Lupus   . Mild CAD 2009   Past Surgical History:  Procedure Laterality Date  . TEE WITHOUT CARDIOVERSION N/A 12/17/2016   Procedure: TRANSESOPHAGEAL ECHOCARDIOGRAM (TEE);  Surgeon: Skeet Latch, MD;  Location: [facility] ENDOSCOPY;  Service: Cardiovascular;  Laterality: N/A;   Social History:   reports that he quit smoking about 15 years ago. He has quit using smokeless tobacco. He reports that he drinks about 0.6 oz of alcohol per week . His drug history is not on file.  Family History  Problem Relation Age of Onset  . Cirrhosis Brother   . Alcohol abuse Brother   . Stroke Maternal Grandmother   . Cancer Brother     throat cancer (smoker)    Medications: Patient's Medications  New Prescriptions   No medications on file  Previous Medications   ATORVASTATIN (LIPITOR) 80 MG TABLET    Take 1 tablet (80 mg total) by mouth at bedtime.   CLOPIDOGREL (PLAVIX) 75 MG TABLET    Take 1 tablet (75 mg total) by mouth daily.   FUROSEMIDE (LASIX) 40 MG TABLET    Take 1 tablet (40 mg total) by mouth daily.   GABAPENTIN (NEURONTIN) 300 MG CAPSULE    Take 1-3 tablets qhs as needed for lower extremity pain.   INSULIN GLARGINE (LANTUS) 100 UNIT/ML INJECTION    Inject 0.05 mLs (5 Units total) into the skin at bedtime.   ISOSORBIDE MONONITRATE (IMDUR) 30  MG 24 HR TABLET    Take 1 tablet (30 mg total) by mouth daily.   METFORMIN (GLUCOPHAGE) 1000 MG TABLET    Take 1 tablet (1,000 mg total) by mouth 2 (two) times daily with a meal.   NITROGLYCERIN (NITROSTAT) 0.4 MG SL TABLET    Place 1 tablet (0.4 mg total) under the tongue every 5 (five) minutes as needed for chest pain.   SACUBITRIL-VALSARTAN (ENTRESTO) 97-103 MG    Take 1 tablet by mouth 2 (two) times daily.   TRAVOPROST, BENZALKONIUM, (TRAVATAN) 0.004 % OPHTHALMIC SOLUTION    Place 1 drop into both eyes at bedtime.   Modified Medications   No medications on file    Discontinued Medications   ASPIRIN EC 325 MG EC TABLET    Take 1 tablet (325 mg total) by mouth daily.   CARVEDILOL (COREG) 6.25 MG TABLET    Take 1 tablet (6.25 mg total) by mouth 2 (two) times daily.     Physical Exam: Vitals:   12/20/16 1350  BP: (!) 88/55  Pulse: 66  Resp: 20  Temp: 98 F (36.7 C)  Weight: 218 lb (98.9 kg)  Height: 6' (1.829 m)    Physical Exam  Constitutional: He is oriented to person, place, and time. He appears well-developed and well-nourished. No distress.  HENT:  Head: Normocephalic and atraumatic.  Mouth/Throat: Oropharynx is clear and moist. No oropharyngeal exudate.  Tongue coated, noted swallowing during exam but holds magic cup in mouth, unable to swallow magic cup, asked if he was able to swallow supplement or  his pills and he nodded no.  Eyes: Conjunctivae and EOM are normal. Pupils are equal, round, and reactive to light.  Neck: Normal range of motion. Neck supple.  Cardiovascular: Normal rate, regular rhythm and normal heart sounds.   Pulmonary/Chest: Effort normal.  Diminished throughout   Abdominal: Soft. Bowel sounds are normal.  Musculoskeletal: He exhibits no edema or tenderness.  Neurological: He is alert and oriented to person, place, and time.  Right side slightly weaker than left.   Skin: Skin is warm and dry. He is not diaphoretic.  Psychiatric: He has a normal mood and affect.    Labs reviewed: Basic Metabolic Panel:  Recent Labs  12/14/16 0254 12/15/16 0957 12/16/16 0414  NA 138 136 137  K 3.3* 4.2 3.9  CL 105 105 107  CO2 23 24 21*  GLUCOSE 132* 161* 101*  BUN 12 7 5*  CREATININE 0.86 0.79 0.79  CALCIUM 8.9 8.9 8.5*   Liver Function Tests:  Recent Labs  01/26/16 0958 12/14/16 0254  AST 18 27  ALT 36 19  ALKPHOS 81 67  BILITOT 0.5 0.8  PROT 6.5 6.3*  ALBUMIN 3.7 3.1*   No results for input(s): LIPASE, AMYLASE in the last 8760 hours. No results for input(s): AMMONIA in the last 8760  hours. CBC:  Recent Labs  01/26/16 0958 12/13/16 1251 12/14/16 0254 12/15/16 0957  WBC 4.7 8.8 5.8 4.3  NEUTROABS 3,102 7.2  --   --   HGB 13.6 13.9 12.5* 13.5  HCT 40.3 40.0 37.4* 39.5  MCV 92.0 89.1 89.5 89.4  PLT 244 355 277 269   TSH:  Recent Labs  12/14/16 0254  TSH 3.699   A1C: Lab Results  Component Value Date   HGBA1C 10.0 11/05/2016   Lipid Panel:  Recent Labs  11/14/16 0920  CHOL 249*  HDL 42  LDLCALC 178*  TRIG 146  CHOLHDL 5.9*    Radiological  Exams: Ct Head Wo Contrast  Result Date: 12/13/2016 CLINICAL DATA:  Expressive aphasia.  Fall. EXAM: CT HEAD WITHOUT CONTRAST TECHNIQUE: Contiguous axial images were obtained from the base of the skull through the vertex without intravenous contrast. COMPARISON:  None. FINDINGS: Brain: Image quality degraded by motion Generalized mild atrophy. Multiple areas of chronic infarction involving the right frontal lobe and right parietal lobe. Chronic infarct in the left frontal lobe and in the left external capsule and periventricular white matter. Chronic infarct in the central pons. Chronic infarct in the left superior cerebellum. No acute infarct identified by CT. Negative for hemorrhage or mass. No shift of the midline structures. Vascular: No hyperdense vessel or unexpected calcification. Skull: Negative Sinuses/Orbits: Negative Other: None IMPRESSION: Extensive chronic ischemic changes as above. No acute intracranial abnormality. Image quality degraded by motion. Electronically Signed   By: Franchot Gallo M.D.   On: 12/13/2016 13:16   Mr Jeri Cos [id number] Contrast  Result Date: 12/14/2016 CLINICAL DATA:  Initial evaluation for acute speech difficulty, altered mental status, fall. EXAM: MRI HEAD WITHOUT AND WITH CONTRAST TECHNIQUE: Multiplanar, multiecho pulse sequences of the brain and surrounding structures were obtained without and with intravenous contrast. CONTRAST:  22mL MULTIHANCE GADOBENATE DIMEGLUMINE 529 MG/ML IV  SOLN COMPARISON:  Prior CT from 12/13/2016. FINDINGS: Brain: Diffuse prominence of the CSF containing spaces is compatible with generalized age-related cerebral atrophy. Patchy and confluent T2/FLAIR hyperintensity within the periventricular and deep white matter both cerebral hemispheres most compatible chronic microvascular ischemic disease. Multiple scattered areas of encephalomalacia are present within the anterior frontal lobes bilaterally, compatible with remote ischemic infarcts. Additional small remote right parietal and occipital lobe infarcts present as well. Remote lacunar infarcts involve the bilateral basal ganglia. Small amount of chronic hemorrhage noted with the remote left basal ganglia infarct. Scattered remote left cerebellar infarcts present as well. There is abnormal restricted diffusion involving the central and left paramedian aspect of the pons, compatible with acute ischemic infarct (series 4, image 17). This is most predominant at the ventral aspect of the pons. No associated hemorrhage or significant mass effect. No other evidence for acute or subacute ischemia. Gray-white matter differentiation otherwise maintained. No mass lesion, midline shift, or mass effect. No hydrocephalus. No extra-axial fluid collection. Major dural sinuses are grossly patent. No abnormal enhancement. Pituitary gland and suprasellar region within normal limits. Vascular: Abnormal flow void within the distal left vertebral artery, which may be related to slow flow and/ or occlusion (series 7, image 3). This is suspected to be chronic in nature given the remote left cerebellar infarcts. Normal flow voids seen within the remainder the vertebrobasilar system. Intravascular flow voids preserved within the anterior circulation as well. Skull and upper cervical spine: Craniocervical junction within normal limits. Visualized upper cervical spine unremarkable. Bone marrow signal intensity within normal limits. No scalp  soft tissue abnormality. Sinuses/Orbits: Globes and oval soft tissues within normal limits. Mild scattered mucosal thickening within the ethmoidal air cells and maxillary sinuses. Paranasal sinuses are otherwise clear. No mastoid effusion. Inner ear structures grossly normal. Other: No other significant finding. IMPRESSION: 1. Acute ischemic nonhemorrhagic pontine infarct as above. No associated mass effect. 2. Multiple additional remote/chronic ischemic infarcts as detailed above. 3. Generalized age-related cerebral atrophy with moderate chronic microvascular ischemic disease. 4. Abnormal flow void within the distal left vertebral artery, compatible with slow flow and/or occlusion. This is suspected to be chronic in nature given the presence of multiple remote left cerebellar infarcts. Remainder of the intravascular flow  voids are maintained. Electronically Signed   By: Jeannine Boga M.D.   On: 12/14/2016 22:15   Assessment/Plan 1. Cerebrovascular accident (CVA) due to thrombosis of left vertebral artery (HCC) Left with aphagia and dysphagia which has now progressed 2. Dysphagia, unspecified type Per ST note in hospital  On 12/18/16 Clinical Impression  Pt seen during breakfast meal. He used external aid for recall of chin tuck strategy with Mod I; however, during meal he needed Max cues for coordination of chin tuck before the swallow, clearance of oral residue from solid POs, and slower rate/not taking more until the bolus was cleared. Intermittent coughing observed. SLP manually removed solid residue from his mouth after cues for lingual sweep, liquid wash, and pureed boluses were not able to clear it. Recommend to downgrade diet to Dys 2 textures, continuing thin liquids, but with need for full supervision for safety.      Has not been able to swallow with Dysphagia 2 diet and he is holding all consistencies in his mouth without swallowing. Overall clinical state has worsened. Spoke with Dr  Linna Darner who saw pt yesterday on admission and due to the fact he is unable to get any nutrition PO plan is to send to hospital for possible progression of CVA and evaluation of PEG tube for nutrition and hydration. Discussed with pt and he is agreeable to this plan.   Carlos American. Harle Battiest  [facility] [phone] 8 am - 5 pm) [phone] (after hours)

## 2016-12-21 ENCOUNTER — Inpatient Hospital Stay (HOSPITAL_COMMUNITY): Payer: PPO

## 2016-12-21 DIAGNOSIS — R1312 Dysphagia, oropharyngeal phase: Secondary | ICD-10-CM

## 2016-12-21 LAB — COMPREHENSIVE METABOLIC PANEL
ALK PHOS: 70 U/L (ref 38–126)
ALT: 79 U/L — ABNORMAL HIGH (ref 17–63)
ANION GAP: 12 (ref 5–15)
AST: 39 U/L (ref 15–41)
Albumin: 3.2 g/dL — ABNORMAL LOW (ref 3.5–5.0)
BUN: 34 mg/dL — ABNORMAL HIGH (ref 6–20)
CALCIUM: 8.6 mg/dL — AB (ref 8.9–10.3)
CO2: 20 mmol/L — AB (ref 22–32)
Chloride: 108 mmol/L (ref 101–111)
Creatinine, Ser: 1.7 mg/dL — ABNORMAL HIGH (ref 0.61–1.24)
GFR calc non Af Amer: 40 mL/min — ABNORMAL LOW (ref >60)
GFR, EST AFRICAN AMERICAN: 47 mL/min — AB (ref >60)
Glucose, Bld: 94 mg/dL (ref 65–99)
Potassium: 3.7 mmol/L (ref 3.5–5.1)
SODIUM: 140 mmol/L (ref 135–145)
Total Bilirubin: 0.9 mg/dL (ref 0.3–1.2)
Total Protein: 6.3 g/dL — ABNORMAL LOW (ref 6.5–8.1)

## 2016-12-21 LAB — GLUCOSE, CAPILLARY
Glucose-Capillary: 106 mg/dL — ABNORMAL HIGH (ref 65–99)
Glucose-Capillary: 59 mg/dL — ABNORMAL LOW (ref 65–99)
Glucose-Capillary: 63 mg/dL — ABNORMAL LOW (ref 65–99)
Glucose-Capillary: 91 mg/dL (ref 65–99)

## 2016-12-21 LAB — CBC
HCT: 38.3 % — ABNORMAL LOW (ref 39.0–52.0)
HEMOGLOBIN: 12.9 g/dL — AB (ref 13.0–17.0)
MCH: 30.6 pg (ref 26.0–34.0)
MCHC: 33.7 g/dL (ref 30.0–36.0)
MCV: 91 fL (ref 78.0–100.0)
Platelets: 278 10*3/uL (ref 150–400)
RBC: 4.21 MIL/uL — AB (ref 4.22–5.81)
RDW: 13.9 % (ref 11.5–15.5)
WBC: 6 10*3/uL (ref 4.0–10.5)

## 2016-12-21 LAB — MRSA PCR SCREENING: MRSA BY PCR: NEGATIVE

## 2016-12-21 LAB — LACTIC ACID, PLASMA: LACTIC ACID, VENOUS: 1.1 mmol/L (ref 0.5–1.9)

## 2016-12-21 MED ORDER — DEXTROSE 50 % IV SOLN
INTRAVENOUS | Status: AC
Start: 2016-12-21 — End: 2016-12-21
  Administered 2016-12-21: 50 mL
  Filled 2016-12-21: qty 50

## 2016-12-21 MED ORDER — METOPROLOL TARTRATE 5 MG/5ML IV SOLN: 2.5000 mg | Freq: Two times a day (BID) | INTRAVENOUS | Status: DC

## 2016-12-21 MED ORDER — DEXTROSE-NACL 5-0.45 % IV SOLN
INTRAVENOUS | Status: DC
  Administered 2016-12-21 – 2016-12-26 (×14): via INTRAVENOUS

## 2016-12-21 MED ORDER — CARVEDILOL 3.125 MG PO TABS: 3.1250 mg | ORAL_TABLET | Freq: Two times a day (BID) | ORAL | Status: DC

## 2016-12-21 MED ORDER — DEXTROSE 5 % IV SOLN
1.0000 g | Freq: Once | INTRAVENOUS | Status: AC
  Administered 2016-12-21: 1 g via INTRAVENOUS
  Filled 2016-12-21: qty 10

## 2016-12-21 MED ORDER — ENOXAPARIN SODIUM 40 MG/0.4ML ~~LOC~~ SOLN
40.0000 mg | SUBCUTANEOUS | Status: DC
  Administered 2016-12-23: 40 mg via SUBCUTANEOUS
  Filled 2016-12-21: qty 0.4

## 2016-12-21 NOTE — Progress Notes (Signed)
Hypoglycemic Event  CBG: 59  Treatment: D50 IV 50 mL  Symptoms: None  Follow-up CBG: Time:na CBG Result:na  Possible Reasons for Event: Inadequate meal intake  Comments/MD notified:none  Patient was taken to MRI and follow up CBG was unable to be  Performed.    Joslyn Hy Ray

## 2016-12-21 NOTE — Evaluation (Signed)
Clinical/Bedside Swallow Evaluation Patient Details  Name: Blake Wilson MRN: [id number] Date of Birth: 1950-12-13  Today's Date: 12/21/2016 Time: SLP Start Time (ACUTE ONLY): [id number] SLP Stop Time (ACUTE ONLY): 1410 SLP Time Calculation (min) (ACUTE ONLY): 15 min  Past Medical History:  Past Medical History:  Diagnosis Date  . CHF (congestive heart failure) ([place])   . Diabetes mellitus   . Hyperlipidemia   . Lupus   . Mild CAD 2009   Past Surgical History:  Past Surgical History:  Procedure Laterality Date  . TEE WITHOUT CARDIOVERSION N/A 12/17/2016   Procedure: TRANSESOPHAGEAL ECHOCARDIOGRAM (TEE);  Surgeon: Skeet Latch, MD;  Location: [facility] ENDOSCOPY;  Service: Cardiovascular;  Laterality: N/A;   HPI:  66 y.o. male presenting from [place] facility for no PO intake since discharge found to have an AKI. PMH is significant for CAD, HFrEF, dilated cardiomyopathy, T2DM with neuropathy, alcohol abuse, and recent hospitalization for CVA/UTI/rhabdomyolysis. Patient evaluated by ST 12/15/16  during recent admission, with cognitive communication impairment (deficits in memory, initiation, executive function, safety/judgment) and moderately reduced intelligibility due to low vocal intensity, wet vocal quality, and mildly impaired articulation. Referred for cognitive linguistic and swallowing evaluation.    Assessment / Plan / Recommendation Clinical Impression  Patient presents with severe risk for aspiration due to prior CVA, cognitive impairment, oral holding and absent swallow reflex in spite of max cues. While patient has baseline cognitive deficits which may be contributing to dysphagia, patient presents with significant deterioration in swallow function compared with evaluation one week ago by this clinician. Patient also with new onset of expressive language deficits which were not present during prior evaluation (see cognitive-linguistic assessment). Upon SLP entry, patient  upright at edge of bed in front of lunch tray, found to be holding large bolus of mechanical soft solid with copious secretions upon inspection of oral cavity. Solids removed and oral cavity suctioned. Patient is alert, oriented and responds gesturally to questions. He follows multi-step commands. Oral care performed. Attempted thermal stimulation and tactile cues with ice chip, however patient makes minimal efforts to manipulate bolus and is unable to initiate swallow. Bolus removed and oral cavity suctioned. Build-up of oral secretions requiring frequent oral suction. MD informed. Recommend NPO at this time; patient may need temporary alternative nutrition/hydration. SLP to follow up for readiness for diet initiation/instrumental examination as appropriate.  SLP Visit Diagnosis: Dysphagia, unspecified (R13.10)    Aspiration Risk  Severe aspiration risk;Risk for inadequate nutrition/hydration    Diet Recommendation NPO   Medication Administration: Via alternative means    Other  Recommendations Oral Care Recommendations: Oral care QID   Follow up Recommendations Skilled Nursing facility      Frequency and Duration min 2x/week  2 weeks       Prognosis Prognosis for Safe Diet Advancement: Fair Barriers to Reach Goals: Cognitive deficits;Severity of deficits;Language deficits      Swallow Study   General Date of Onset: 12/20/16 HPI: 66 y.o. male presenting from [place] for no PO intake since discharge found to have an AKI. PMH is significant for CAD, HFrEF, dilated cardiomyopathy, T2DM with neuropathy, alcohol abuse, and recent hospitalization for CVA/UTI/rhabdomyolysis. Patient evaluated by ST 12/15/16  during recent admission, with cognitive communication impairment (deficits in memory, initiation, executive function, safety/judgment) and moderately reduced intelligibility due to low vocal intensity, wet vocal quality, and mildly impaired articulation. Referred for  cognitive linguistic and swallowing evaluation.  Type of Study: Bedside Swallow Evaluation Previous Swallow Assessment: See HPI Diet Prior  to this Study: Dysphagia 2 (chopped);Thin liquids Temperature Spikes Noted: No Respiratory Status: Room air History of Recent Intubation: No Behavior/Cognition: Alert;Cooperative;Pleasant mood Oral Cavity Assessment: Other (comment);Excessive secretions (bolus of green beans in the mouth which he had been holding) Oral Care Completed by SLP: Yes Oral Cavity - Dentition: Missing dentition;Poor condition Vision: Functional for self-feeding Self-Feeding Abilities: Able to feed self Patient Positioning: Upright in bed (edge of bed) Baseline Vocal Quality: Low vocal intensity (Required max cues and demonstration to attain voice) Volitional Cough: Cognitively unable to elicit Volitional Swallow: Unable to elicit    Oral/Motor/Sensory Function Overall Oral Motor/Sensory Function: Mild impairment Facial ROM: Within Functional Limits Facial Symmetry: Within Functional Limits Facial Strength: Within Functional Limits Facial Sensation: Within Functional Limits Lingual ROM: Within Functional Limits;Reduced right;Reduced left Lingual Symmetry: Within Functional Limits Lingual Strength: Reduced Lingual Sensation: Within Functional Limits Velum: Within Functional Limits Mandible: Impaired   Ice Chips Ice chips: Impaired Presentation: Spoon Oral Phase Impairments: Reduced labial seal;Reduced lingual movement/coordination Oral Phase Functional Implications: Oral holding Other Comments: Could not elicit swallow despite max verbal and tactile cues, bolus removed and oral suction provided.   Thin Liquid Thin Liquid: Not tested    Nectar Thick Nectar Thick Liquid: Not tested   Honey Thick Honey Thick Liquid: Not tested   Puree Puree: Not tested   Solid   GO   Solid: Not tested       Blake Lever, MS CF-SLP Speech-Language  Pathologist [phone]  Blake Wilson 12/21/2016,2:45 PM

## 2016-12-21 NOTE — Progress Notes (Signed)
Morning meds not given. Patient is unable to swallow. Follows commands but unable to get medications to go down even crushed in apple sauce. Joslyn Hy, MSN, RN, [employer]

## 2016-12-21 NOTE — Progress Notes (Signed)
Called by RN to discuss stroke protocol. Given that the patient's neuro change likely occurred at some point prior to admission and he has been neurologically stable, will defer NIH stroke scale. Patient is medically optimized at present. Q4H neuro checks.

## 2016-12-21 NOTE — Progress Notes (Signed)
OT NOTE  Ptcurrent D/C plan is SNF and from a SNF. No apparent immediate acute care OT needs, therefore will defer OT to SNF. If OT eval is needed please call Acute Rehab Dept. at [phone] or text page OT at [phone].    Jeri Modena   OTR/L Pager: [phone] Office: [phone] .

## 2016-12-21 NOTE — Evaluation (Signed)
Physical Therapy Evaluation Patient Details Name: Blake Wilson MRN: [id number] DOB: 1950/11/24 Today's Date: 12/21/2016   History of Present Illness  66 y.o. male presenting from [place] for no PO intake since discharge found to have an AKI. PMH is significant for CAD, HFrEF, dilated cardiomyopathy, T2DM with neuropathy, alcohol abuse, and recent hospitalization for CVA/UTI/rhabdomyolysis.    Clinical Impression  Pt admitted with above diagnosis. Pt currently with functional limitations due to the deficits listed below (see PT Problem List). On eval, pt required mod assist transfers and min assist ambulation with RW 100 feet.  Pt will benefit from skilled PT to increase their independence and safety with mobility to allow discharge to the venue listed below.       Follow Up Recommendations SNF    Equipment Recommendations  None recommended by PT    Recommendations for Other Services       Precautions / Restrictions Precautions Precautions: Fall Restrictions Weight Bearing Restrictions: No      Mobility  Bed Mobility Overal bed mobility: Needs Assistance Bed Mobility: Supine to Sit;Sit to Supine     Supine to sit: Supervision;HOB elevated Sit to supine: Supervision;HOB elevated   General bed mobility comments: +rail   Transfers Overall transfer level: Needs assistance Equipment used: Rolling walker (2 wheeled) Transfers: Sit to/from Stand Sit to Stand: Mod assist         General transfer comment: increased time, assist to power up and stabilize initial standing balance. Pt retropulsive.  Ambulation/Gait Ambulation/Gait assistance: Min assist Ambulation Distance (Feet): 100 Feet Assistive device: Rolling walker (2 wheeled) Gait Pattern/deviations: Step-through pattern;Decreased stride length;Trunk flexed Gait velocity: decreased   General Gait Details: verbal cues for posture and to stay close to RW. Assist with RW and balance during  turns.  Stairs            Wheelchair Mobility    Modified Rankin (Stroke Patients Only)       Balance Overall balance assessment: Needs assistance Sitting-balance support: No upper extremity supported;Feet supported Sitting balance-Leahy Scale: Fair   Postural control: Posterior lean Standing balance support: Bilateral upper extremity supported;During functional activity Standing balance-Leahy Scale: Poor                               Pertinent Vitals/Pain Pain Assessment: Faces Faces Pain Scale: No hurt    Home Living Family/patient expects to be discharged to:: Skilled nursing facility Living Arrangements: Alone   Type of Home: Apartment Home Access: Level entry     Home Layout: One level Home Equipment: None      Prior Function Level of Independence: Independent               Hand Dominance   Dominant Hand: Right    Extremity/Trunk Assessment   Upper Extremity Assessment Upper Extremity Assessment: Defer to OT evaluation    Lower Extremity Assessment Lower Extremity Assessment: Generalized weakness    Cervical / Trunk Assessment Cervical / Trunk Assessment: Kyphotic  Communication   Communication: Expressive difficulties  Cognition Arousal/Alertness: Awake/alert Behavior During Therapy: Flat affect Overall Cognitive Status: Impaired/Different from baseline Area of Impairment: Orientation;Attention;Following commands;Safety/judgement;Problem solving;Awareness Orientation Level: Disoriented to;Situation;Time Current Attention Level: Selective   Following Commands: Follows multi-step commands consistently;Follows multi-step commands with increased time Safety/Judgement: Decreased awareness of deficits;Decreased awareness of safety Awareness: Intellectual Problem Solving: Slow processing;Decreased initiation;Requires verbal cues      General Comments  Exercises     Assessment/Plan    PT Assessment Patient needs  continued PT services  PT Problem List Decreased strength;Decreased activity tolerance;Decreased balance;Decreased knowledge of use of DME;Decreased safety awareness;Decreased mobility;Decreased knowledge of precautions;Decreased cognition;Decreased coordination       PT Treatment Interventions DME instruction;Gait training;Therapeutic exercise;Patient/family education;Therapeutic activities;Cognitive remediation;Functional mobility training    PT Goals (Current goals can be found in the Care Plan section)  Acute Rehab PT Goals Patient Stated Goal: not stated PT Goal Formulation: With patient Time For Goal Achievement: 01/04/17 Potential to Achieve Goals: Fair    Frequency Min 2X/week   Barriers to discharge        Co-evaluation               End of Session Equipment Utilized During Treatment: Gait belt Activity Tolerance: Patient tolerated treatment well Patient left: in chair;with chair alarm set;with call bell/phone within reach;with nursing/sitter in room Nurse Communication: Mobility status           Time: [id number] PT Time Calculation (min) (ACUTE ONLY): 20 min   Charges:   PT Evaluation $PT Eval Moderate Complexity: 1 Procedure     PT G Codes:         Lorriane Shire 12/21/2016, 10:59 AM

## 2016-12-21 NOTE — Consult Note (Signed)
NEURO HOSPITALIST CONSULT NOTE   Requestig physician: Dr. Andria Frames  Reason for Consult: Extension of pontine infarction  History obtained from:   Chart    HPI:                                                                                                                                          Blake Wilson is an 66 y.o. male recently discharged after an admission for pontine CVA affecting speech and swallowing. He had residual deficits of dysarthria, expressive aphasia, and right sided weakness. He was on secondary stroke prevention with ASA, Plavix, and Lipitor. He was readmitted from [place] facility for failure to thrive and severely decreased PO intake thought partially to be due to a further worsening in his swallowing function. AKI was noted on this admission, thought to be due to dehydration. His PMHx includes CAD, multiple chronic ischemic infarctions, HFrEF, dilated cardiomyopathy, DM2, neuropathy and EtOH abuse.   A follow up MRI was obtained, revealing extension of the previously seen pontine ischemic infarction.      Past Medical History:  Diagnosis Date  . CHF (congestive heart failure) ([place])   . Diabetes mellitus   . Hyperlipidemia   . Lupus   . Mild CAD 2009    Past Surgical History:  Procedure Laterality Date  . TEE WITHOUT CARDIOVERSION N/A 12/17/2016   Procedure: TRANSESOPHAGEAL ECHOCARDIOGRAM (TEE);  Surgeon: Skeet Latch, MD;  Location: [facility] ENDOSCOPY;  Service: Cardiovascular;  Laterality: N/A;    Family History  Problem Relation Age of Onset  . Cirrhosis Brother   . Alcohol abuse Brother   . Stroke Maternal Grandmother   . Cancer Brother     throat cancer (smoker)    Social History:  reports that he quit smoking about 15 years ago. He has quit using smokeless tobacco. He reports that he drinks about 0.6 oz of alcohol per week . He reports that he does not use drugs.  No Known Allergies  MEDICATIONS:                                                                                                                      Scheduled: . aspirin  300 mg Rectal Daily  . atorvastatin  80 mg Oral QHS  .  carvedilol  3.125 mg Oral BID WC  . clopidogrel  75 mg Oral Daily  . enoxaparin (LOVENOX) injection  40 mg Subcutaneous Q24H  . folic acid  1 mg Oral Daily  . gabapentin  300 mg Oral QHS  . insulin glargine  5 Units Subcutaneous QHS  . isosorbide mononitrate  30 mg Oral Daily  . multivitamin with minerals  1 tablet Oral Daily  . thiamine  100 mg Oral Daily  . Travoprost (BAK Free)  1 drop Both Eyes QHS    ROS:                                                                                                                                       History obtained from patient who nods yes/no for ROS questions. No headache, chest pain, abdominal pain, sensory loss or limb weakness. Also denies swallowing difficulty which suggests that the above are unreliable answers.  Blood pressure (!) 148/48, pulse (!) 51, temperature 97.6 F (36.4 C), temperature source Oral, resp. rate 18, height 6' (1.829 m), weight 93 kg (205 lb 0.4 oz), SpO2 100 %.   General Examination:                                                                                                      HEENT-  Normocephalic/atraumatic.  Lungs- Respirations unlabored Extremities- No edema.   Neurological Examination Mental Status: Awake and alert. Somewhat decreased attention. Mute. Follows all motor commands. Holds up correct number of fingers when asked to perform simple math.   Cranial Nerves: II: PERRL. Points bilaterally to double simultaneous stimulation of right and left visual fields.   III,IV, VI: ptosis not present, mild exotropia noted. Gazes to left and right without nystagmus.  V,VII: No facial droop, indicates that temperature sensation is equal bilaterally VIII: hearing intact to questions and commands IX,X: unable to visualize palate XI: no  asymmetry XII: midline tongue extension Motor: Right : Upper extremity   5/5     Left:     Upper extremity   5/5 Lower extremities: 4/5 hip flexion and knee extension on right, 4+ hip flexion and knee extension on right Increased tone bilateral lower extremities. Bulk normal x 4.       Sensory: Indicates that temperature sensation is equal in upper and lower extremities. No extinction to double simultaneous stimulation with fine touch.  Deep Tendon Reflexes: Hypoactive upper and lower extremity reflexes.  Cerebellar: Slow FNF bilaterally without ataxia Gait: Deferred Other:  Exaggerated startle reflex noted  Lab Results: Basic Metabolic Panel:  Recent Labs Lab 12/15/16 0957 12/16/16 0414 12/20/16 1727 12/21/16 0059  NA 136 137 139 140  K 4.2 3.9 5.1 3.7  CL 105 107 104 108  CO2 24 21* 22 20*  GLUCOSE 161* 101* 93 94  BUN 7 5* 35* 34*  CREATININE 0.79 0.79 2.44* 1.70*  CALCIUM 8.9 8.5* 9.4 8.6*  MG  --   --  2.2  --     Liver Function Tests:  Recent Labs Lab 12/20/16 1727 12/21/16 0059  AST 50* 39  ALT 94* 79*  ALKPHOS 79 70  BILITOT 0.7 0.9  PROT 7.4 6.3*  ALBUMIN 3.9 3.2*   No results for input(s): LIPASE, AMYLASE in the last 168 hours. No results for input(s): AMMONIA in the last 168 hours.  CBC:  Recent Labs Lab 12/15/16 0957 12/20/16 1727 12/21/16 0059  WBC 4.3 7.4 6.0  NEUTROABS  --  5.0  --   HGB 13.5 14.2 12.9*  HCT 39.5 42.5 38.3*  MCV 89.4 91.0 91.0  PLT 269 359 278    Cardiac Enzymes:  Recent Labs Lab 12/15/16 0957 12/16/16 0414 12/20/16 2218  CKTOTAL 369 197 82    Lipid Panel: No results for input(s): CHOL, TRIG, HDL, CHOLHDL, VLDL, LDLCALC in the last 168 hours.  CBG:  Recent Labs Lab 12/18/16 1627 12/20/16 2301 12/21/16 0823 12/21/16 1259 12/21/16 1657  GLUCAP 96 91 91 106* 96*    Microbiology: Results for orders placed or performed during the hospital encounter of 12/20/16  MRSA PCR Screening     Status: None    Collection Time: 12/21/16  5:41 AM  Result Value Ref Range Status   MRSA by PCR NEGATIVE NEGATIVE Final    Comment:        The GeneXpert MRSA Assay (FDA approved for NASAL specimens only), is one component of a comprehensive MRSA colonization surveillance program. It is not intended to diagnose MRSA infection nor to guide or monitor treatment for MRSA infections.     Coagulation Studies: No results for input(s): LABPROT, INR in the last 72 hours.  Imaging: Mr Brain Wo Contrast  Result Date: 12/21/2016 CLINICAL DATA:  New Aphasia. EXAM: MRI HEAD WITHOUT CONTRAST TECHNIQUE: Multiplanar, multiecho pulse sequences of the brain and surrounding structures were obtained without intravenous contrast. COMPARISON:  12/14/2016 FINDINGS: Brain: Acute infarct in the upper pons has progressed. The infarct involves nearly the entire AP span of the pons and the entire anterior transverse span. No superimposed hemorrhage. Extensive chronic ischemic injury with multiple remote infarcts in the upper left cerebellum. Remote bifrontal and right parietal cortical infarcts that are moderate volume. Multiple small chronic cortical infarcts in the right MCA distribution. Chronic lacune in the upper left subinsular white matter with hemosiderin staining. No hydrocephalus. Vascular: Persistent loss of flow void in the left vertebral artery. The basilar remains patent. Skull and upper cervical spine: No marrow lesion. Sinuses/Orbits: Negative Other: These results were called by telephone at the time of interpretation on 12/21/2016 at 7:13 pm to Dr. Lindell Noe , who verbally acknowledged these results. IMPRESSION: 1. Large acute bilateral pontine infarct that has progressed since 12/14/2016. No superimposed hemorrhage. 2. Known loss of left vertebral flow. Normal-appearing basilar flow void. 3. Extensive chronic ischemic injury as described. Electronically Signed   By: Monte Fantasia M.D.   On: 12/21/2016 19:13      Assessment: 66 year old with worsening dysphagia.  1. Repeat MRI reveals extension of  previously seen pontine ischemic infarction. The infarction is extensive and involves the pons bilaterally.   2. Multiple additional remote/chronic ischemic infarcts are seen supratentorially and infratentorially.  3. Abnormal flow void within the distal left vertebral artery, compatible with slow flow and/or occlusion. This is suspected to be chronic in nature given the presence of multiple remote left cerebellar infarcts. Remainder of the intravascular flow voids are maintained on MRI. 4. Prior CTA of neck from 2/25 revealed an occluded LEFT vertebral artery with thready distal V2 reconstitution, an occluded distal LEFT V3 segment, mild long segment narrowing of the RIGHT cervical internal carotid artery attributable to atherosclerosis or fibromuscular dysplasia. 5. Prior CTA head revealed occluded LEFT vertebral artery, occluded RIGHT supraclinoid internal carotid artery, reconstituted RIGHT carotid terminus with decreased RIGHT middle cerebral artery perfusion and severely stenotic RIGHT M1 segment. These findings are seen on a background of moderate intracranial atherosclerosis. 6. Given lack of PO intake at SNF, extension of stroke most likely secondary to missed doses of ASA and Plavix.  Recommendations: 1. Patient refusing NGT for administration of Plavix and ASA. For now, administer ASA as rectal formulation.  2. Will likely need PEG tube. Given the size of the extended pontine infarction, prognosis for swallowing function over the short and medium term is poor. With prolonged speech therapy, he may regain swallowing function sufficiently for an oral diet, but this is not definite.  3. IV hydration until NGT or PEG can be placed.  4. Frequent neuro checks.  5. Permissive HTN x 48 hours.  6. PT/OT/Speech. 7. Resume high dose atorvastatin when PEG or NGT placed.  Electronically signed: Dr. Kerney Elbe 12/21/2016, 7:30 PM

## 2016-12-21 NOTE — Evaluation (Signed)
Speech Language Pathology Evaluation Patient Details Name: Blake Wilson MRN: [id number] DOB: 1951/01/14 Today's Date: 12/21/2016 Time: [id number] SLP Time Calculation (min) (ACUTE ONLY): 14 min  Problem List:  Patient Active Problem List   Diagnosis Date Noted  . Late effect of cerebrovascular accident (CVA)   . Oropharyngeal dysphagia   . Failure to thrive (0-17) 12/20/2016  . AKI (acute kidney injury) ([place]) 12/20/2016  . Dehydration   . Abnormal EKG 12/16/2016  . Aphasia 12/16/2016  . CVA (cerebral vascular accident) ([place])   . Urinary tract infection with hematuria   . Elevated CK   . Low TSH level 06/13/2014  . Chronic systolic heart failure ([place]) 04/01/2014  . Pain in joint, ankle and foot 04/01/2014  . Pain in lower limb 12/22/2013  . Metatarsalgia of both feet 12/22/2013  . Equinus deformity of foot, acquired 12/22/2013  . Onychomycosis 12/22/2013  . Diabetic neuropathy, painful ([place]) 12/22/2013  . Discomfort of chest wall 10/26/2013  . Sleeping difficulty 10/07/2012  . Left leg pain 10/29/2011  . GOUT, UNSPECIFIED 06/26/2010  . GLAUCOMA 04/17/2009  . Elevated lipids 03/02/2009  . Coronary atherosclerosis 03/02/2009  . OBESITY 01/17/2009  . Alcohol abuse 01/09/2009  . Congestive dilated cardiomyopathy ([place]) 01/09/2009  . HYPERTENSION, BENIGN SYSTEMIC 12/18/2006  . SLE 12/18/2006   Past Medical History:  Past Medical History:  Diagnosis Date  . CHF (congestive heart failure) ([place])   . Diabetes mellitus   . Hyperlipidemia   . Lupus   . Mild CAD 2009   Past Surgical History:  Past Surgical History:  Procedure Laterality Date  . TEE WITHOUT CARDIOVERSION N/A 12/17/2016   Procedure: TRANSESOPHAGEAL ECHOCARDIOGRAM (TEE);  Surgeon: Skeet Latch, MD;  Location: [facility] ENDOSCOPY;  Service: Cardiovascular;  Laterality: N/A;   HPI:  66 y.o. male presenting from [place] facility for no PO intake since discharge found to have an AKI. PMH is significant for  CAD, HFrEF, dilated cardiomyopathy, T2DM with neuropathy, alcohol abuse, and recent hospitalization for CVA/UTI/rhabdomyolysis. Patient evaluated by ST 12/15/16  during recent admission, with cognitive communication impairment (deficits in memory, initiation, executive function, safety/judgment) and moderately reduced intelligibility due to low vocal intensity, wet vocal quality, and mildly impaired articulation. Referred for cognitive linguistic and swallowing evaluation.    Assessment / Plan / Recommendation Clinical Impression  Patient presents with new onset of severe expressive language and mild auditory comprehension deficits in addition to existing cognitive impairment noted during prior evaluation 12/15/16. Administed Western Aphasia Battery-Revised Bedside form; patient scored 40, indicating severe impairment. Patient is unable to initiate speech without max verbal cues and demonstration. SLP cued for automatic speech, mouth opening and phonation; patient was able to count numbers 1-9, state months Jan-Aug, and days of the week. Patient benefitted from intermittent suctioning of oral cavity. Patient scores 0/10 for verbal naming of objects, however he is able to write 4/5 words with verbal cues. Intermittently able to repeat, phonemic paraphasias noted. Apraxia score 8/10. Recommend skilled ST to address language and cognitive deficits in order to maximize functional communication. MD, RN informed of new onset of symptoms. SLP will follow.     SLP Assessment  SLP Recommendation/Assessment: Patient needs continued Speech Lanaguage Pathology Services SLP Visit Diagnosis: Dysarthria and anarthria (R47.1);Aphasia (R47.01);Cognitive communication deficit (R41.841)    Follow Up Recommendations  Skilled Nursing facility    Frequency and Duration min 2x/week  2 weeks      SLP Evaluation Cognition  Overall Cognitive Status: Difficult to assess Arousal/Alertness: Awake/alert Orientation Level:  Other (comment);Oriented to time;Oriented to person (Aphasic, indicates correct month/day gesturally) Attention: Focused Focused Attention: Appears intact Memory:  (Not tested) Initiating: Impaired Initiating Impairment: Verbal basic;Functional basic       Comprehension  Auditory Comprehension Overall Auditory Comprehension: Impaired Yes/No Questions: Impaired (7/10) Commands: Within Functional Limits Conversation: Simple Other Conversation Comments:  (Patient responds gesturally and with head nods) Visual Recognition/Discrimination Discrimination: Not tested Reading Comprehension Reading Status: Not tested    Expression Expression Primary Mode of Expression: Nonverbal - gestures Verbal Expression Overall Verbal Expression: Impaired Initiation: Impaired Automatic Speech: Name;Counting;Day of week;Month of year (Requires cues to initiate phonation, mouth opening) Level of Generative/Spontaneous Verbalization:  (None) Repetition: Impaired Level of Impairment: Word level;Phrase level;Sentence level (word level 60% accuracy, phrase level 25% accuracy) Naming: Impairment Confrontation: Impaired (0/10) Other Naming Comments: Patient able to name objects in writing (4/5) Verbal Errors: Phonemic paraphasias;Aware of errors Pragmatics: Impairment Impairments: Eye contact Interfering Components: Premorbid deficit Effective Techniques: Articulatory cues;Other (Comment) (Repetition, automatic speech) Non-Verbal Means of Communication: Eye gaze;Gestures;Writing Written Expression Dominant Hand: Right Written Expression: Exceptions to [facility] Self Formulation Ability: Word   Oral / Motor  Oral Motor/Sensory Function Overall Oral Motor/Sensory Function: Mild impairment Facial ROM: Within Functional Limits Facial Symmetry: Within Functional Limits Facial Strength: Within Functional Limits Facial Sensation: Within Functional Limits Lingual ROM: Within Functional Limits;Reduced  right;Reduced left Lingual Symmetry: Within Functional Limits Lingual Strength: Reduced Lingual Sensation: Within Functional Limits Velum: Within Functional Limits Mandible: Impaired Motor Speech Overall Motor Speech: Impaired Respiration: Within functional limits Phonation: Other (comment);Low vocal intensity (Intermittently able to phonate, requires max cues) Resonance: Within functional limits Articulation: Impaired Level of Impairment: Word Intelligibility: Intelligibility reduced Word: 25-49% accurate Phrase: 0-24% accurate Sentence: 0-24% accurate Conversation: 0-24% accurate Motor Planning: Impaired Level of Impairment: Word Motor Speech Errors: Aware Effective Techniques:  (Visual and verbal cues for placement, phonation, imitation)   [place], MS CF-SLP Speech-Language Pathologist [phone]         Blake Wilson 12/21/2016, 3:13 PM

## 2016-12-21 NOTE — Progress Notes (Signed)
Family Medicine Teaching Service Daily Progress Note Intern Pager: [phone]  Patient name: Blake Wilson Medical record number: [id number] Date of birth: 29-Nov-1950 Age: 66 y.o. Gender: male  Primary Care Provider: Luiz Blare, DO Consultants: None  Code Status: FULL   Pt Overview and Major Events to Date:  3/2: Admitted for failure to thrive and AKI   Assessment and Plan: Blake Wilson is a 66 y.o. male presenting from [place] facility for no PO intake since discharge found to have an AKI. PMH is significant for CAD, HFrEF, dilated cardiomyopathy, T2DM with neuropathy, alcohol abuse, and recent hospitalization for CVA/UTI/rhabdomyolysis.    AKI: Cr at admission 2.44 (bl 0.8-1.0). Suspect pre-renal physiology given history of no PO intake x2 days prior to admission. S/p 2L bolus in ED and now IVF NS at 125 cc/hr overnight. Cr has down-trended to 1.7. CK was WNL.  -continue IVFs  -follow Cr  -hold entresto, metformin, and lasix  -avoid nephrotoxic agents   Failure to Thrive: Patient without any PO intake since discharge on 2/28. Evaluated by SLP at prior hospitalization. MBS performed and deemed patient moderate aspiration risk. Patient was discharged with a Dysphagia 2 diet. Patient was brought from [facility] for evaluation for PEG tube.  -Dysphagia II diet ordered  -IVFs as above  -SLP consult  -may need IR consult for evaluation of PEG pending SLP recs  -consider addition of Remeron at bedtime if able to tolerate PO  -questionable thrush may be contributing to patients poor PO intake   Recent CVA: Acute ischemic nonhemorrhagic pontine infarct without mass effect and multiple chronic infarcts. Residual deficits of dysarthria, expressive aphasia, and right sided weakness.  -continue ASA, Plavix, and Lipitor  -PT/OT consult  -SW consult for anticipated return to SNF at d/c   Lactic Acidosis, Resolved: Lactic acid elevated to 2.83 at admission. Now normal at  1.1. Likely secondary to poor perfusion in setting of poor PO intake complicated by metformin use.  UTI: Patient denies symptoms. UA now without WBCs, RBCs, and leukocytes. Small Hgb remains. Patient due to complete course of antibiotics 3/3. -Ciprofloxacin through today to complete abx course   Elevated LFTs: AST 50 and ALT 94 > AST 29 and ALT 79. CK normal. Patient is taking statin.  -continue to monitor   HFrEF: TEE at last admission with LVEF 40-45%, diffuse hypokinesis, mild aortic and mitral regurgitation. Coreg decreased from 25 mg BID to 6.25 mg BID at last hospitalization due to bradycardia. HR in upper 50s initially but improved to 70. HR stable at [facility] yesterday. No signs of volume overload on exam, in fact patient appears clinically dehydrated.  -hold Entresto and Lasix due to AKI  -continue Imdur  -monitor volume status with IVFs  -may need to decrease Coreg to 3.125 mg BID due to borderline low HRs   T2DM: Lantus dose decreased to 5u at recent hospitalization. CBGs stable.  -continue Lantus  -CBG AC/HS  -add SSI if CBGs elevated  -hold metformin   Alcohol Abuse: Patient denies EtOH use. However, from chart review of recent admission patient has history of alcohol use and most recent drink was reported to be 2/21.  -monitor CIWA scores: 0 >0; would d/c monitoring after 24 hours if scores remain low  -thiamine, folate, MVI    FEN/GI:  NS at 125 cc/hr, Dysphagia II diet  Prophylaxis: Lovenox   Disposition: Pending improvement of AKI and ability to maintain nutritional status   Subjective:  Sleeping in bed this morning.  Denies complaints. Shakes head no when asked if having pain with swallowing or oral pain.  Per RN, when given pills last night he choked on water and she has been worried about giving him liquids since.   Objective: Temp:  [98 F (36.7 C)-98.6 F (37 C)] 98.6 F (37 C) (03/03 0535) Pulse Rate:  [55-71] 55 (03/03 0535) Resp:  [11-20] 20  (03/03 0535) BP: (88-145)/(45-66) 135/49 (03/03 0535) SpO2:  [95 %-100 %] 100 % (03/03 0535) Weight:  [205 lb 0.4 oz (93 kg)-218 lb (98.9 kg)] 205 lb 0.4 oz (93 kg) (03/02 2213) Physical Exam: General: lying in bed in NAD  Cardiovascular: RRR. No murmurs appreciated.  Respiratory: CTAB. Normal WOB.  Abdomen: soft, NTND  Extremities: No LE edema.   Laboratory:  Recent Labs Lab 12/15/16 0957 12/20/16 1727 12/21/16 0059  WBC 4.3 7.4 6.0  HGB 13.5 14.2 12.9*  HCT 39.5 42.5 38.3*  PLT 269 359 278    Recent Labs Lab 12/16/16 0414 12/20/16 1727 12/21/16 0059  NA 137 139 140  K 3.9 5.1 3.7  CL 107 104 108  CO2 21* 22 20*  BUN 5* 35* 34*  CREATININE 0.79 2.44* 1.70*  CALCIUM 8.5* 9.4 8.6*  PROT  --  7.4 6.3*  BILITOT  --  0.7 0.9  ALKPHOS  --  79 70  ALT  --  94* 79*  AST  --  50* 39  GLUCOSE 101* 93 94    Imaging/Diagnostic Tests: None  Nicolette Bang, DO 12/21/2016, 6:11 AM PGY-2, [place] Intern pager: [phone], text pages welcome

## 2016-12-22 ENCOUNTER — Inpatient Hospital Stay (HOSPITAL_COMMUNITY): Payer: PPO

## 2016-12-22 DIAGNOSIS — R627 Adult failure to thrive: Secondary | ICD-10-CM

## 2016-12-22 LAB — CBC
HCT: 42.8 % (ref 39.0–52.0)
HEMOGLOBIN: 14.5 g/dL (ref 13.0–17.0)
MCH: 30.7 pg (ref 26.0–34.0)
MCHC: 33.9 g/dL (ref 30.0–36.0)
MCV: 90.7 fL (ref 78.0–100.0)
PLATELETS: 286 10*3/uL (ref 150–400)
RBC: 4.72 MIL/uL (ref 4.22–5.81)
RDW: 13.4 % (ref 11.5–15.5)
WBC: 6.6 10*3/uL (ref 4.0–10.5)

## 2016-12-22 LAB — BASIC METABOLIC PANEL
Anion gap: 8 (ref 5–15)
BUN: 12 mg/dL (ref 6–20)
CALCIUM: 9 mg/dL (ref 8.9–10.3)
CO2: 20 mmol/L — ABNORMAL LOW (ref 22–32)
CREATININE: 0.91 mg/dL (ref 0.61–1.24)
Chloride: 108 mmol/L (ref 101–111)
Glucose, Bld: 142 mg/dL — ABNORMAL HIGH (ref 65–99)
Potassium: 4.7 mmol/L (ref 3.5–5.1)
SODIUM: 136 mmol/L (ref 135–145)

## 2016-12-22 LAB — GLUCOSE, CAPILLARY
GLUCOSE-CAPILLARY: 111 mg/dL — AB (ref 65–99)
GLUCOSE-CAPILLARY: 139 mg/dL — AB (ref 65–99)
Glucose-Capillary: 127 mg/dL — ABNORMAL HIGH (ref 65–99)

## 2016-12-22 MED ORDER — HYDRALAZINE HCL 20 MG/ML IJ SOLN
5.0000 mg | INTRAMUSCULAR | Status: DC | PRN
  Administered 2016-12-25 – 2016-12-26 (×2): 5 mg via INTRAVENOUS
  Filled 2016-12-22 (×2): qty 1

## 2016-12-22 MED ORDER — ASPIRIN 300 MG RE SUPP
300.0000 mg | Freq: Every day | RECTAL | Status: DC
  Administered 2016-12-23: 300 mg via RECTAL
  Filled 2016-12-22: qty 1

## 2016-12-22 NOTE — Progress Notes (Signed)
  Speech Language Pathology Treatment: Dysphagia;Cognitive-Linquistic  Patient Details Name: [name] MRN: [id number] DOB: [birth year] Today's Date: 12/22/2016 Time: [id number] SLP Time Calculation (min) (ACUTE ONLY): 15 min  Assessment / Plan / Recommendation Clinical Impression  Patient seen for dysphagia and cognitive linguistic treatment. Patient with mouth closed and pooling of oral secretions upon SLP arrival. Provided oral suction to remove secretions. Patient required SLP cues to initiate speech. Provided verbal cues for mouth opening, phonation, which patient was able to attain follow demonstration. Facilitated automatic speech; patient counts to 12 with min cues. Attempted additional automatic speech tasks including days of the week, months; patient with 10% accuracy and max verbal, visual cues. Required frequent suctioning of oral secretions. Dysphagia treatment targeted swallow initiation. SLP provided max verbal cues and tactile stimulation of larynx; after extended time patient with what appears to palpation be weak, incomplete swallow with reduced hyolaryngeal excursion. At this time transport arrived to take patient to CT and session was terminated. Provided patient with education regarding exercises, continuing attempts to swallow throughout the day, suctioning secretions as needed. Recommend continuing NPO. MD planning for PEG as patient declined NGT. SLP will continue to follow.    HPI HPI: 66 y.o. male presenting from [place] for no PO intake since discharge found to have an AKI. PMH is significant for CAD, HFrEF, dilated cardiomyopathy, T2DM with neuropathy, alcohol abuse, and recent hospitalization for CVA/UTI/rhabdomyolysis. Patient evaluated by ST 12/15/16  during recent admission, with cognitive communication impairment (deficits in memory, initiation, executive function, safety/judgment) and moderately reduced intelligibility due to low vocal intensity,  wet vocal quality, and mildly impaired articulation. Referred for cognitive linguistic and swallowing evaluation.       SLP Plan  Continue with current plan of care       Recommendations  Diet recommendations: NPO Medication Administration: Via alternative means                Oral Care Recommendations: Oral care QID Follow up Recommendations: Skilled Nursing facility SLP Visit Diagnosis: Dysarthria and anarthria (R47.1);Aphasia (R47.01);Cognitive communication deficit (R41.841);Dysphagia, unspecified (R13.10) Plan: Continue with current plan of care       [place], [place] CF-SLP Speech-Language Pathologist [phone]  Aliene Altes 12/22/2016, 2:45 PM

## 2016-12-22 NOTE — Progress Notes (Signed)
STROKE TEAM PROGRESS NOTE   HISTORY OF PRESENT ILLNESS (per record) Blake Wilson is an 66 y.o. male recently discharged after an admission for pontine CVA affecting speech and swallowing. He had residual deficits of dysarthria, expressive aphasia, and right sided weakness. He was on secondary stroke prevention with ASA, Plavix, and Lipitor. He was readmitted from [place] facility for failure to thrive and severely decreased PO intake thought partially to be due to a further worsening in his swallowing function. AKI was noted on this admission, thought to be due to dehydration. His PMHx includes CAD, multiple chronic ischemic infarctions, HFrEF, dilated cardiomyopathy, DM2, neuropathy and EtOH abuse.   A follow up MRI was obtained, revealing extension of the previously seen pontine ischemic infarction.    SUBJECTIVE (INTERVAL HISTORY)    OBJECTIVE Temp:  [97.6 F (36.4 C)-98.3 F (36.8 C)] 97.6 F (36.4 C) (03/04 1227) Pulse Rate:  [49-70] 56 (03/04 1227) Resp:  [18-22] 22 (03/04 1227) BP: (144-188)/(48-63) 144/48 (03/04 1227) SpO2:  [100 %] 100 % (03/04 1227)  CBC:  Recent Labs Lab 12/20/16 1727 12/21/16 0059 12/22/16 1101  WBC 7.4 6.0 6.6  NEUTROABS 5.0  --   --   HGB 14.2 12.9* 14.5  HCT 42.5 38.3* 42.8  MCV 91.0 91.0 90.7  PLT 359 278 [id number]    Basic Metabolic Panel:  Recent Labs Lab 12/20/16 1727 12/21/16 0059 12/22/16 1101  NA 139 140 136  K 5.1 3.7 4.7  CL 104 108 108  CO2 22 20* 20*  GLUCOSE 93 94 142*  BUN 35* 34* 12  CREATININE 2.44* 1.70* 0.91  CALCIUM 9.4 8.6* 9.0  MG 2.2  --   --     Lipid Panel:    Component Value Date/Time   CHOL 249 (H) 11/14/2016 0920   TRIG 146 11/14/2016 0920   HDL 42 11/14/2016 0920   CHOLHDL 5.9 (H) 11/14/2016 0920   VLDL 29 11/14/2016 0920   LDLCALC 178 (H) 11/14/2016 0920   HgbA1c:  Lab Results  Component Value Date   HGBA1C 10.0 11/05/2016   Urine Drug Screen:    Component Value Date/Time    LABOPIA NONE DETECTED 12/13/2016 1347   COCAINSCRNUR NONE DETECTED 12/13/2016 1347   LABBENZ NONE DETECTED 12/13/2016 1347   AMPHETMU NONE DETECTED 12/13/2016 1347   THCU NONE DETECTED 12/13/2016 1347   LABBARB NONE DETECTED 12/13/2016 1347      IMAGING  Mr Brain Wo Contrast 12/21/2016 1. Large acute bilateral pontine infarct that has progressed since 12/14/2016. No superimposed hemorrhage.  2. Known loss of left vertebral flow. Normal-appearing basilar flow void.  3. Extensive chronic ischemic injury as described.    Dg Abd Portable 1v 12/21/2016 No evidence of an enteric tube on this radiograph. Nonobstructive bowel gas pattern.     CTA Head and Neck 12/15/2016 CT HEAD:  Evolving nonhemorrhagic LEFT pontine infarct. Multiple old small vessel and large vascular territory infarcts.  CTA NECK:  Occluded LEFT vertebral artery, thready distal V2 reconstitution, occluded at distal LEFT V3 segment. Mild long segment narrowing RIGHT cervical internal carotid artery attributable to atherosclerosis or, fibromuscular dysplasia.  CTA HEAD:   Occluded LEFT vertebral artery. Occluded RIGHT supraclinoid internal carotid artery, reconstituted RIGHT carotid terminus with decreased RIGHT middle cerebral artery perfusion. Severely stenotic RIGHT M1 segment. Moderate intracranial atherosclerosis.    PHYSICAL EXAM HEENT-  Normocephalic/atraumatic.  Lungs- Respirations unlabored Extremities- No edema.   Neurological Examination Mental Status: Awake and alert. Somewhat decreased attention. Mute. Follows all  motor commands. Holds up correct number of fingers when asked to perform simple math.   Cranial Nerves: II: PERRL. Points bilaterally to double simultaneous stimulation of right and left visual fields.   III,IV, VI: ptosis not present, mild exotropia noted. Gazes to left and right without nystagmus.  V,VII: No facial droop, indicates that temperature sensation is equal  bilaterally VIII: hearing intact to questions and commands IX,X: unable to visualize palate XI: no asymmetry XII: midline tongue extension Motor: Right :  Upper extremity   5/5                                       Left:     Upper extremity   5/5 Lower extremities: 4/5 hip flexion and knee extension on right, 4+ hip flexion and knee extension on right Increased tone bilateral lower extremities. Bulk normal x 4.                                                   Sensory: Indicates that temperature sensation is equal in upper and lower extremities. No extinction to double simultaneous stimulation with fine touch.  Deep Tendon Reflexes: Hypoactive upper and lower extremity reflexes.  Cerebellar: Slow FNF bilaterally without ataxia Gait: Deferred Other: Exaggerated startle reflex noted     ASSESSMENT/PLAN [name] is a 66 y.o. male with history of coronary artery disease, dilated cardiomyopathy, neuropathy, ETOH hx, previous stroke, lupus, hyperlipidemia, diabetes mellitus, and congestive heart failure  presenting with failure to thrive with decreased PO intake. He did not receive IV t-PA due to recent stroke.  Stroke:  Large acute bilateral pontine infarct that has progressed secondary to small vessel disease.   MRI - Large acute bilateral pontine infarct that has progressed since 12/14/2016.  MRA - not performed  Carotid Doppler - CTA neck 12/15/2016  2D Echo - 12/16/2016  TEE - 12/17/2016 - EF 40 - 45%. No cardiac source of emboli.  LDL - 11/14/2016 - 178  HgbA1C - 11/05/2016 - 10  VTE prophylaxis - Lovenox  Diet NPO time specified  aspirin 325 mg daily and clopidogrel 75 mg daily prior to admission, now on aspirin 300 mg suppository daily and clopidogrel 75 mg daily  Patient counseled to be compliant with his antithrombotic medications  Ongoing aggressive stroke risk factor management  Therapy recommendations: SNF (speech therapy recommends NPO)  Disposition:  Pending  Hypertension  Stable  Permissive hypertension (OK if < 220/120) but gradually normalize in 5-7 days  Long-term BP goal normotensive  Hyperlipidemia  Home meds:  Lipitor 80 mg daily resumed in hospital  LDL 178 11/14/2016, goal < 70  Continue statin at discharge  Diabetes  HgbA1c 10 11/05/2016,  goal < 7.0  Uncontrolled  Other Stroke Risk Factors  Advanced age  The patient quit smoking 15 years ago.  ETOH use, advised to drink no more than 1 drink per day  Overweight, Body mass index is 27.81 kg/m., recommend weight loss, diet and exercise as appropriate   Hx stroke/TIA  Family hx stroke (maternal grandmother)  Coronary artery disease   Other Active Problems  NPO -> NG tube ordered - pt declined. PEG planned.  Failure to thrive  UTI -> IV Rocephin started 12/21/2016  Hospital day #  2  Total length of visit 35 minutes.  To contact Stroke Continuity provider, please refer to [email or web address] After hours, contact General Neurology

## 2016-12-22 NOTE — Progress Notes (Signed)
Family Medicine Teaching Service Daily Progress Note Intern Pager: [phone]  Patient name: Blake Wilson Medical record number: [id number] Date of birth: 05-Dec-1950 Age: 66 y.o. Gender: male  Primary Care Provider: Luiz Blare, DO Consultants: None  Code Status: FULL   Pt Overview and Major Events to Date:  3/2: Admitted for failure to thrive and AKI   Assessment and Plan: Blake Wilson is a 66 y.o. male presenting from [place] facility for no PO intake since discharge found to have an AKI. PMH is significant for CAD, HFrEF, dilated cardiomyopathy, T2DM with neuropathy, alcohol abuse, and recent hospitalization for CVA/UTI/rhabdomyolysis.    #CVA, Pontine infarction, Acute/Subacute: Acute ischemic nonhemorrhagic pontine infarct without mass effect and multiple chronic infarcts. Residual deficits of dysarthria, expressive aphasia, and right sided weakness.  - MRI showed progressed bilateral pontine infarction (larger since 2/24 study) - Neurology Consult: appreciate recs - Continue ASA, Plavix, and Lipitor   - Patient refusing NGT >> orders placed for PEG tube placement; will continue rectal ASA until safe to transition to PEG or NGT allowed - Consult IR: discussed need for PEG placement; order placed; IR requested non-contrast Abd CT to be done prior to procedure for anatomy evaluation. Will see patient 12/23/16. - PT/OT consult  - SW consult for anticipated return to SNF at d/c   #AKI, resolved: Cr at admission 2.44 (bl 0.8-1.0). Suspect pre-renal physiology given history of no PO intake x2 days prior to admission. CK was WNL. - Creatinine: 0.91 (3/4) - Continue IVFs  - Trend Cr  - Hold entresto, metformin, and lasix (nephrotoxic) >> until NGT or PEG tube placed - Avoid nephrotoxic agents   Failure to Thrive: Patient without any PO intake since discharge on 2/28. Evaluated by SLP at prior hospitalization. MBS performed and deemed patient moderate aspiration risk.  Patient was discharged with a Dysphagia 2 diet. Patient was brought from [facility] for evaluation for PEG tube.  - Dysphagia II diet ordered  - IVFs as above  - SLP consult  - IR consult for evaluation of PEG - consider addition of Remeron at bedtime if able to tolerate PO  - Some questionable thrush discussed in previous notes; none noted by this provider   Lactic Acidosis, Resolved: Lactic acid elevated to 2.83 at admission. Now normal at 1.1. Likely secondary to poor perfusion in setting of poor PO intake complicated by metformin use.  UTI: Patient denies symptoms. UA now without WBCs, RBCs, and leukocytes. Small Hgb remains. Patient due to complete course of antibiotics 3/3. - Completed Ciprofloxacin course   Elevated LFTs: AST 50 and ALT 94 > AST 29 and ALT 79. CK normal. Patient is taking statin.  -continue to monitor   HFrEF: TEE at last admission with LVEF 40-45%. Coreg decreased from 25 mg BID to 6.25 mg BID at last hospitalization due to bradycardia. HR stable at [facility]. No signs of volume overload on exam.  - hold Entresto and Lasix due to AKI  - continuing Coreg and Imdur orders >> not receiving due to inability to take PO - monitor volume status with IVFs   T2DM: Lantus dose decreased to 5u at recent hospitalization. CBGs stable.  - continue Lantus  - CBG AC/HS  - SSI if CBGs elevated  - hold metformin   Alcohol Abuse: Patient denies EtOH use. However, from chart review of recent admission patient has history of alcohol use and most recent drink was reported to be 2/21.  -monitor CIWA scores: 0 >0; would d/c  monitoring after 24 hours if scores remain low  -thiamine, folate, MVI    FEN/GI:  NS at 125 cc/hr, Dysphagia II diet  Prophylaxis: Lovenox   Disposition: Pending improvement of AKI and ability to maintain nutritional status   Subjective:  Sleeping in bed this morning. Denies complaints. Shakes head no when asked if having pain. Discussed the need  for PEG tube if no NGT is to be placed. Patient acknowledged this and nodded "yes" when asked if he felt comfortable with [place] moving forward with this plan.   Objective: Temp:  [97.6 F (36.4 C)-98.3 F (36.8 C)] 98 F (36.7 C) (03/04 0655) Pulse Rate:  [49-70] 59 (03/04 0655) Resp:  [18-20] 18 (03/04 0655) BP: (148-188)/(48-63) 173/58 (03/04 0655) SpO2:  [100 %] 100 % (03/04 0655) Physical Exam: General: lying in bed in NAD  Cardiovascular: RRR. No murmurs appreciated.  Respiratory: CTAB. Normal WOB.  Abdomen: soft, NTND  Extremities: No LE edema.   Laboratory:  Recent Labs Lab 12/20/16 1727 12/21/16 0059 12/22/16 1101  WBC 7.4 6.0 6.6  HGB 14.2 12.9* 14.5  HCT 42.5 38.3* 42.8  PLT 359 278 286    Recent Labs Lab 12/20/16 1727 12/21/16 0059 12/22/16 1101  NA 139 140 136  K 5.1 3.7 4.7  CL 104 108 108  CO2 22 20* 20*  BUN 35* 34* 12  CREATININE 2.44* 1.70* 0.91  CALCIUM 9.4 8.6* 9.0  PROT 7.4 6.3*  --   BILITOT 0.7 0.9  --   ALKPHOS 79 70  --   ALT 94* 79*  --   AST 50* 39  --   GLUCOSE 93 94 142*    Imaging/Diagnostic Tests: None  Elberta Leatherwood, MD 12/22/2016, 12:23 PM PGY-3, [place] Intern pager: [phone], text pages welcome

## 2016-12-23 DIAGNOSIS — I63012 Cerebral infarction due to thrombosis of left vertebral artery: Secondary | ICD-10-CM

## 2016-12-23 DIAGNOSIS — F1010 Alcohol abuse, uncomplicated: Secondary | ICD-10-CM

## 2016-12-23 DIAGNOSIS — R4701 Aphasia: Secondary | ICD-10-CM

## 2016-12-23 LAB — BASIC METABOLIC PANEL
Anion gap: 3 — ABNORMAL LOW (ref 5–15)
BUN: 6 mg/dL (ref 6–20)
CHLORIDE: 107 mmol/L (ref 101–111)
CO2: 26 mmol/L (ref 22–32)
Calcium: 8.4 mg/dL — ABNORMAL LOW (ref 8.9–10.3)
Creatinine, Ser: 0.97 mg/dL (ref 0.61–1.24)
GFR calc non Af Amer: >60 mL/min (ref >60)
Glucose, Bld: 150 mg/dL — ABNORMAL HIGH (ref 65–99)
Potassium: 3.8 mmol/L (ref 3.5–5.1)
SODIUM: 136 mmol/L (ref 135–145)

## 2016-12-23 LAB — CBC
HCT: 39.1 % (ref 39.0–52.0)
Hemoglobin: 13.5 g/dL (ref 13.0–17.0)
MCH: 31 pg (ref 26.0–34.0)
MCHC: 34.5 g/dL (ref 30.0–36.0)
MCV: 89.7 fL (ref 78.0–100.0)
PLATELETS: 249 10*3/uL (ref 150–400)
RBC: 4.36 MIL/uL (ref 4.22–5.81)
RDW: 13.5 % (ref 11.5–15.5)
WBC: 4.9 10*3/uL (ref 4.0–10.5)

## 2016-12-23 LAB — GLUCOSE, CAPILLARY
GLUCOSE-CAPILLARY: 124 mg/dL — AB (ref 65–99)
GLUCOSE-CAPILLARY: 124 mg/dL — AB (ref 65–99)
GLUCOSE-CAPILLARY: 128 mg/dL — AB (ref 65–99)
GLUCOSE-CAPILLARY: 130 mg/dL — AB (ref 65–99)
Glucose-Capillary: 108 mg/dL — ABNORMAL HIGH (ref 65–99)

## 2016-12-23 MED ORDER — INSULIN ASPART 100 UNIT/ML ~~LOC~~ SOLN
0.0000 [IU] | Freq: Three times a day (TID) | SUBCUTANEOUS | Status: DC
  Administered 2016-12-23: 1 [IU] via SUBCUTANEOUS
  Administered 2016-12-24 (×2): 2 [IU] via SUBCUTANEOUS
  Administered 2016-12-25 – 2016-12-30 (×9): 1 [IU] via SUBCUTANEOUS
  Administered 2016-12-31: 2 [IU] via SUBCUTANEOUS
  Administered 2016-12-31: 5 [IU] via SUBCUTANEOUS
  Administered 2016-12-31: 2 [IU] via SUBCUTANEOUS

## 2016-12-23 MED ORDER — ENOXAPARIN SODIUM 40 MG/0.4ML ~~LOC~~ SOLN
40.0000 mg | SUBCUTANEOUS | Status: DC
  Filled 2016-12-23: qty 0.4

## 2016-12-23 NOTE — Discharge Summary (Signed)
[facility] Discharge Summary  Patient name: Blake Wilson Medical record number: [id number] Date of birth: 20-Jan-1951 Age: 66 y.o. Gender: male Date of Admission: 12/20/2016  Date of Discharge: 12/31/16 Admitting Physician: Blake Resides, MD  Primary Care Provider: Luiz Blare, DO Consultants: SLP, IR  Indication for Hospitalization: aphasia  Discharge Diagnoses/Problem List:  CVA, bilateral pontine infarct with progression PEG tube  Failure to thrive  Disposition: SNF  Discharge Condition: stable  Discharge Exam: see progress note from day of discharge  Brief Hospital Course:  Blake Wilson presented from [place] facility due to concerns for lack of PO intake secondary to inability swallow. Patient unable to safely swallow on modified barium swallow. Due to concern for extension of stroke, MRI performed and extension confirmed. Neurology consulted, who recommended ASA and plavix via NG tube. Patient declined NG tube.  Hydrated with D5 1/2 NS while awaiting PEG tube.  IR consulted for PEG tube, which was placed on 3/7 Patient tolerate procedure well. NG feeds were started and titrated up to goal. On day of discharge, patient was hyperkalemic to 6.1 without symptoms. Kayexalate given and repeat K normalized. Pt was noted to have elevated phosphorus to 5.1 on the day of discharge. Nutrition recommending changing his feeds from Jevity to Nepro Carb Steady.   Issues for Follow Up:  1. Continued speech therapy and oral care.  2. NPO pending further speech evaluation.  3. Continue to assess depression. Patient denied SSRI while hospitalized.  4. Continue to monitor BPs. On discharge, Pt was restarted on half of his home dose of Entresto as well as his home Coreg. We held his Lasix and Imdur. This should be restarted as appropriate. 5. Recommend rechecking BMET, mag, and phos on 3/14. If electrolytes normalize, can change nutrition back to  Jevity.  Significant Procedures: PEG tube 3/7  Significant Labs and Imaging:   Recent Labs Lab 12/25/16 2028 12/26/16 0801 12/31/16 0505  WBC 9.0 5.6 7.2  HGB 14.5 13.2 15.2  HCT 42.4 39.1 45.9  PLT 219 213 261    Recent Labs Lab 12/25/16 1400  12/27/16 0750 12/28/16 0545 12/29/16 0848 12/30/16 0642 12/30/16 1049 12/30/16 1514 12/30/16 1826 12/31/16 0505 12/31/16 1352  NA 138  < > 139 135  --  132* 134* 135 135 134* 133*  K 5.4*  < > 3.6 4.1  --  5.5* 6.1* 5.7* 5.0 5.2* 4.6  CL 111  < > 108 105  --  102 104 104 101 101 98*  CO2 19*  < > 23 22  --  21* 21* 23 26 25 24   GLUCOSE 119*  < > 125* 137*  --  133* 112* 177* 151* 130* 136*  BUN <5*  < > <5* 5*  --  15 15 20 19  21* 27*  CREATININE 0.81  < > 0.81 0.84  --  0.92 0.97 1.08 1.00 1.11 1.19  CALCIUM 8.4*  < > 8.8* 9.0  --  9.4 9.6 9.2 9.3 9.4 9.1  MG  --   < > 1.7 1.7 1.6* 1.7  --   --   --  1.8  --   PHOS  --   < > 3.5 3.9 4.2 4.6  --   --   --  4.7* 5.1*  ALKPHOS 71  --   --   --   --   --   --   --   --   --   --  AST 44*  --   --   --   --   --   --   --   --   --   --   ALT 53  --   --   --   --   --   --   --   --   --   --   ALBUMIN 2.9*  --   --   --   --   --   --   --   --   --   --   < > = values in this interval not displayed.  Results/Tests Pending at Time of Discharge: none  Discharge Medications:  Allergies as of 12/31/2016   No Known Allergies     Medication List    STOP taking these medications   ciprofloxacin 250 MG tablet Commonly known as:  CIPRO   furosemide 40 MG tablet Commonly known as:  LASIX   isosorbide mononitrate 30 MG 24 hr tablet Commonly known as:  IMDUR   sacubitril-valsartan 97-103 MG Commonly known as:  ENTRESTO Replaced by:  sacubitril-valsartan 49-51 MG     TAKE these medications   aspirin 325 MG tablet Place 1 tablet (325 mg total) into feeding tube daily. Start taking on:  01/01/2017 What changed:  how to take this   atorvastatin 80 MG tablet Commonly  known as:  LIPITOR Place 1 tablet (80 mg total) into feeding tube at bedtime. What changed:  how to take this   carvedilol 3.125 MG tablet Commonly known as:  COREG Place 1 tablet (3.125 mg total) into feeding tube 2 (two) times daily with a meal. What changed:  how to take this   clopidogrel 75 MG tablet Commonly known as:  PLAVIX Place 1 tablet (75 mg total) into feeding tube daily. What changed:  how to take this   feeding supplement (NEPRO CARB STEADY) Liqd Take 237 mLs by mouth 4 (four) times daily.   feeding supplement (PRO-STAT SUGAR FREE 64) Liqd Place 30 mLs into feeding tube daily. Start taking on:  [id number]   folic acid 1 MG tablet Commonly known as:  FOLVITE Place 1 tablet (1 mg total) into feeding tube daily. Start taking on:  01/01/2017   free water Soln Place 250 mLs into feeding tube 4 (four) times daily.   gabapentin 300 MG capsule Commonly known as:  NEURONTIN Take 1-3 tablets qhs per tube as needed for lower extremity pain. What changed:  additional instructions   insulin glargine 100 UNIT/ML injection Commonly known as:  LANTUS Inject 0.05 mLs (5 Units total) into the skin at bedtime.   metFORMIN 1000 MG tablet Commonly known as:  GLUCOPHAGE Place 1 tablet (1,000 mg total) into feeding tube 2 (two) times daily with a meal. What changed:  how to take this   nitroGLYCERIN 0.4 MG SL tablet Commonly known as:  NITROSTAT Place 1 tablet (0.4 mg total) under the tongue every 5 (five) minutes as needed for chest pain.   sacubitril-valsartan 49-51 MG Commonly known as:  ENTRESTO Take 1 tablet per tube twice daily Replaces:  sacubitril-valsartan 97-103 MG   travoprost (benzalkonium) 0.004 % ophthalmic solution Commonly known as:  TRAVATAN Place 1 drop into both eyes at bedtime.       Discharge Instructions: Please refer to Patient Instructions section of EMR for full details.  Patient was counseled important signs and symptoms that should prompt  return to medical care, changes in medications, dietary instructions, activity  restrictions, and follow up appointments.   Follow-Up Appointments: Contact information for after-discharge care    Destination    [place] [facility] .   Specialty:  [place] information: [id number] [place] [phone]             None at [facility] as patient is going to SNF.   Sela Hua, MD 12/31/2016, 4:17 PM PGY-2, [place]

## 2016-12-23 NOTE — Progress Notes (Signed)
STROKE TEAM PROGRESS NOTE   HISTORY OF PRESENT ILLNESS (per record) Blake Wilson is an 66 y.o. male recently discharged after an admission for pontine CVA affecting speech and swallowing. He had residual deficits of dysarthria, expressive aphasia, and right sided weakness. He was on secondary stroke prevention with ASA, Plavix, and Lipitor. He was readmitted from [place] facility for failure to thrive and severely decreased PO intake thought partially to be due to a further worsening in his swallowing function. AKI was noted on this admission, thought to be due to dehydration. His PMHx includes CAD, multiple chronic ischemic infarctions, HFrEF, dilated cardiomyopathy, DM2, neuropathy and EtOH abuse.   A follow up MRI was obtained, revealing extension of the previously seen pontine ischemic infarction.    SUBJECTIVE (INTERVAL HISTORY) Patient is awake but mute and follows commands. Blood pressure adequately controlled. No family at the bedside.   OBJECTIVE Temp:  [97.6 F (36.4 C)-97.9 F (36.6 C)] 97.6 F (36.4 C) (03/05 1417) Pulse Rate:  [54-60] 60 (03/05 1417) Resp:  [18] 18 (03/05 1417) BP: (156)/(60) 156/60 (03/05 1417) SpO2:  [100 %] 100 % (03/05 1417)  CBC:  Recent Labs Lab 12/20/16 1727  12/22/16 1101 12/23/16 0529  WBC 7.4  < > 6.6 4.9  NEUTROABS 5.0  --   --   --   HGB 14.2  < > 14.5 13.5  HCT 42.5  < > 42.8 39.1  MCV 91.0  < > 90.7 89.7  PLT 359  < > 286 249  < > = values in this interval not displayed.  Basic Metabolic Panel:  Recent Labs Lab 12/20/16 1727  12/22/16 1101 12/23/16 0529  NA 139  < > 136 136  K 5.1  < > 4.7 3.8  CL 104  < > 108 107  CO2 22  < > 20* 26  GLUCOSE 93  < > 142* 150*  BUN 35*  < > 12 6  CREATININE 2.44*  < > 0.91 0.97  CALCIUM 9.4  < > 9.0 8.4*  MG 2.2  --   --   --   < > = values in this interval not displayed.  Lipid Panel:     Component Value Date/Time   CHOL 249 (H) 11/14/2016 0920   TRIG 146 11/14/2016  0920   HDL 42 11/14/2016 0920   CHOLHDL 5.9 (H) 11/14/2016 0920   VLDL 29 11/14/2016 0920   LDLCALC 178 (H) 11/14/2016 0920   HgbA1c:  Lab Results  Component Value Date   HGBA1C 10.0 11/05/2016   Urine Drug Screen:     Component Value Date/Time   LABOPIA NONE DETECTED 12/13/2016 1347   COCAINSCRNUR NONE DETECTED 12/13/2016 1347   LABBENZ NONE DETECTED 12/13/2016 1347   AMPHETMU NONE DETECTED 12/13/2016 1347   THCU NONE DETECTED 12/13/2016 1347   LABBARB NONE DETECTED 12/13/2016 1347      IMAGING  Mr Brain Wo Contrast 12/21/2016 1. Large acute bilateral pontine infarct that has progressed since 12/14/2016. No superimposed hemorrhage.  2. Known loss of left vertebral flow. Normal-appearing basilar flow void.  3. Extensive chronic ischemic injury as described.    Dg Abd Portable 1v 12/21/2016 No evidence of an enteric tube on this radiograph. Nonobstructive bowel gas pattern.     CTA Head and Neck 12/15/2016 CT HEAD:  Evolving nonhemorrhagic LEFT pontine infarct. Multiple old small vessel and large vascular territory infarcts.  CTA NECK:  Occluded LEFT vertebral artery, thready distal V2 reconstitution, occluded at distal LEFT  V3 segment. Mild long segment narrowing RIGHT cervical internal carotid artery attributable to atherosclerosis or, fibromuscular dysplasia.  CTA HEAD:   Occluded LEFT vertebral artery. Occluded RIGHT supraclinoid internal carotid artery, reconstituted RIGHT carotid terminus with decreased RIGHT middle cerebral artery perfusion. Severely stenotic RIGHT M1 segment. Moderate intracranial atherosclerosis.    PHYSICAL EXAM HEENT-  Normocephalic/atraumatic.  Lungs- Respirations unlabored Extremities- No edema.   Neurological Examination Mental Status: Awake and alert. Somewhat decreased attention. Mute. Follows all motor commands. Holds up correct number of fingers when asked to perform tasks Cranial Nerves: II: PERRL. blinks to threat  bilaterally   III,IV, VI: ptosis not present, mild exotropia noted. Gazes to left and right without nystagmus.  V,VII: No facial droop, indicates that temperature sensation is equal bilaterally VIII: hearing intact to questions and commands IX,X: unable to visualize palate XI: no asymmetry XII: midline tongue extension Motor: Right :  Upper extremity   5/5                                       Left:     Upper extremity   5/5 Lower extremities: 4/5 hip flexion and knee extension on right, 4+ hip flexion and knee extension on right Increased tone bilateral lower extremities. Bulk normal x 4.                                                   Sensory: Indicates that temperature sensation is equal in upper and lower extremities. No extinction to double simultaneous stimulation with fine touch.  Deep Tendon Reflexes: Hypoactive upper and lower extremity reflexes.  Cerebellar: Slow FNF bilaterally without ataxia Gait: Deferred Other: Exaggerated startle reflex noted     ASSESSMENT/PLAN Blake Wilson is a 66 y.o. male with history of coronary artery disease, dilated cardiomyopathy, neuropathy, ETOH hx, previous stroke, lupus, hyperlipidemia, diabetes mellitus, and congestive heart failure  presenting with failure to thrive with decreased PO intake. He did not receive IV t-PA due to recent stroke.  Stroke:  Large acute bilateral pontine infarct that has progressed secondary to small vessel disease.   MRI - Large acute bilateral pontine infarct that has progressed since 12/14/2016.  MRA - not performed  Carotid Doppler - CTA neck 12/15/2016  2D Echo - 12/16/2016  TEE - 12/17/2016 - EF 40 - 45%. No cardiac source of emboli.  LDL - 11/14/2016 - 178  HgbA1C - 11/05/2016 - 10  VTE prophylaxis - Lovenox Diet NPO time specified  aspirin 325 mg daily and clopidogrel 75 mg daily prior to admission, now on aspirin 300 mg suppository daily and clopidogrel 75 mg daily  Patient counseled to  be compliant with his antithrombotic medications  Ongoing aggressive stroke risk factor management  Therapy recommendations: SNF (speech therapy recommends NPO)  Disposition: Pending  Hypertension  Stable  Permissive hypertension (OK if < 220/120) but gradually normalize in 5-7 days  Long-term BP goal normotensive  Hyperlipidemia  Home meds:  Lipitor 80 mg daily resumed in hospital  LDL 178 11/14/2016, goal < 70  Continue statin at discharge  Diabetes  HgbA1c 10 11/05/2016,  goal < 7.0  Uncontrolled  Other Stroke Risk Factors  Advanced age  The patient quit smoking 15 years ago.  ETOH use, advised  to drink no more than 1 drink per day  Overweight, Body mass index is 27.81 kg/m., recommend weight loss, diet and exercise as appropriate   Hx stroke/TIA  Family hx stroke (maternal grandmother)  Coronary artery disease   Other Active Problems  NPO -> NG tube ordered - pt declined. PEG planned.  Failure to thrive  UTI -> IV Rocephin started 12/21/2016  Hospital day # 3  I have personally examined this patient, reviewed notes, independently viewed imaging studies, participated in medical decision making and plan of care.ROS completed by me personally and pertinent positives fully documented  I have made any additions or clarifications directly to the above note. Continue antiplatelet therapy and aggressive risk factor control. Unfortunately there is no more definitive treatment that we can offer at this time. Greater than 50% time during this 25 minute visit was spent on coordination of care of your patient stroke, prevention and treatment plan .  Antony Contras, MD Medical Director [place] Pager: [phone] 12/23/2016 6:09 PM   To contact Stroke Continuity provider, please refer to [email or web address] After hours, contact General Neurology

## 2016-12-23 NOTE — Progress Notes (Signed)
Initial Nutrition Assessment  DOCUMENTATION CODES:   Not applicable  INTERVENTION:   -Once feeding access is established, recommend:  Initiate Jevity 1.2 @ 25 ml/hr via PEG and increase by 10 ml every 4 hours to goal rate of 65 ml/hr.   30 ml Prostat daily.    Tube feeding regimen provides 1972 kcal (100% of needs), 101 grams of protein, and 1259 ml of H2O.   If no IVFs, recommend 140 ml free water flush QID (provides additional 560 ml free water (1819 ml free water with inclusion of TF)).   NUTRITION DIAGNOSIS:   Inadequate oral intake related to inability to eat, dysphagia as evidenced by NPO status.  GOAL:   Patient will meet greater than or equal to 90% of their needs  MONITOR:   Labs, Weight trends, Skin, I & O's  REASON FOR ASSESSMENT:   Consult Assessment of nutrition requirement/status  ASSESSMENT:   Blake Wilson is a 66 y.o. male presenting from [place] facility for no PO intake since discharge found to have an AKI. PMH is significant for CAD, HFrEF, dilated cardiomyopathy, T2DM with neuropathy, alcohol abuse, and recent hospitalization for CVA/UTI/rhabdomyolysis.    Pt admitted with AKI, FTT, and recent CVA.   Case discussed with RN prior to visit, who reports continued orders for NPO and plans to place PEG tomorrow (12/24/16). Pt has refused NGT placement.  Spoke with pt at bedside, who is unable to communicate with head nods and hand gestures. He reports poor oral intake over the past 5 days (since 12/18/16), secondary to swallowing difficulty. Pt shares that prior to this he was tolerating a regular diet without difficulty.   Pt reports slight wt loss, however, unable to quantify time frame or quantity of wt loss. He reports his clothing feels looser. Per wt hx, UBW ranges from 210-220#. Noted last wt documented on 12/20/16- 218# and 205#, so unsure of accuracy of most recent wt.   Nutrition-Focused physical exam completed. Findings are no fat  depletion, no muscle depletion, and no edema.   Pt verbalized plan for PEG placement on 12/24/16. RD discussed with pt that he will receive nutrition via PEG once tube is deemed ready to use.   Records from [facility] SNF reviewed.   Labs reviewed: CBGS: 124-128.   Diet Order:  Diet NPO time specified  Skin:  Reviewed, no issues  Last BM:  12/22/16  Height:   Ht Readings from Last 1 Encounters:  12/20/16 6' (1.829 m)    Weight:   Wt Readings from Last 1 Encounters:  12/20/16 205 lb 0.4 oz (93 kg)    Ideal Body Weight:  80.9 kg  BMI:  Body mass index is 27.81 kg/m.  Estimated Nutritional Needs:   Kcal:  1800-2000  Protein:  90-105 grams  Fluid:  1.8-2.0 L  EDUCATION NEEDS:   Education needs addressed  Sheri Prows A. Jimmye Norman, RD, LDN, CDE Pager: [phone] After hours Pager: [phone]

## 2016-12-23 NOTE — Consult Note (Signed)
Chief Complaint: Patient was seen in consultation today for dysphagia  Referring Physician(s):  Dr. Andria Frames   Supervising Physician: Corrie Mckusick  Patient Status: [facility] - In-pt  History of Present Illness: Blake Wilson is a 66 y.o. male with past medical history of CHF, DM, HLD, and CVA now with complaint of severe dysphagia. Patient was evaluated by SLP who determined patient to be at high risk of aspiration and recommends alternate means of nutrition. IR consulted for percutaneous gastrostomy tube placement at the request of Dr. Andria Frames.  Case reviewed by Dr. Kathlene Cote who feels patient is appropriate for procedure.   Patient is NPO.  Lovenox orders placed to be held today and tomorrow.  Past Medical History:  Diagnosis Date  . CHF (congestive heart failure) ([place])   . Diabetes mellitus   . Hyperlipidemia   . Lupus   . Mild CAD 2009    Past Surgical History:  Procedure Laterality Date  . TEE WITHOUT CARDIOVERSION N/A 12/17/2016   Procedure: TRANSESOPHAGEAL ECHOCARDIOGRAM (TEE);  Surgeon: Skeet Latch, MD;  Location: [facility] ENDOSCOPY;  Service: Cardiovascular;  Laterality: N/A;    Allergies: Patient has no known allergies.  Medications: Prior to Admission medications   Medication Sig Start Date End Date Taking? Authorizing Provider  aspirin 325 MG tablet Take 325 mg by mouth daily.   Yes Historical Provider, MD  atorvastatin (LIPITOR) 80 MG tablet Take 1 tablet (80 mg total) by mouth at bedtime. 10/29/11  Yes Carolin Guernsey, MD  carvedilol (COREG) 3.125 MG tablet Take 3.125 mg by mouth 2 (two) times daily with a meal.   Yes Historical Provider, MD  ciprofloxacin (CIPRO) 250 MG tablet Take 250 mg by mouth 2 (two) times daily. Stop date: 12/21/16   Yes Historical Provider, MD  clopidogrel (PLAVIX) 75 MG tablet Take 1 tablet (75 mg total) by mouth daily. 12/19/16  Yes Everrett Coombe, MD  furosemide (LASIX) 40 MG tablet Take 1 tablet (40 mg total) by mouth daily. 01/26/16  Yes  Smiley Houseman, MD  gabapentin (NEURONTIN) 300 MG capsule Take 1-3 tablets qhs as needed for lower extremity pain. Patient taking differently: Take 300 mg by mouth at bedtime. Take 1-3 tablets qhs as needed for lower extremity pain. 03/05/13  Yes Carolin Guernsey, MD  insulin glargine (LANTUS) 100 UNIT/ML injection Inject 0.05 mLs (5 Units total) into the skin at bedtime. 12/18/16  Yes Everrett Coombe, MD  isosorbide mononitrate (IMDUR) 30 MG 24 hr tablet Take 1 tablet (30 mg total) by mouth daily. 01/26/16  Yes Smiley Houseman, MD  metFORMIN (GLUCOPHAGE) 1000 MG tablet Take 1 tablet (1,000 mg total) by mouth 2 (two) times daily with a meal. 12/02/16  Yes Katheren Shams, DO  nitroGLYCERIN (NITROSTAT) 0.4 MG SL tablet Place 1 tablet (0.4 mg total) under the tongue every 5 (five) minutes as needed for chest pain. 01/26/16  Yes Smiley Houseman, MD  sacubitril-valsartan (ENTRESTO) 97-103 MG Take 1 tablet by mouth 2 (two) times daily.   Yes Historical Provider, MD  travoprost, benzalkonium, (TRAVATAN) 0.004 % ophthalmic solution Place 1 drop into both eyes at bedtime.    Yes Historical Provider, MD     Family History  Problem Relation Age of Onset  . Cirrhosis Brother   . Alcohol abuse Brother   . Stroke Maternal Grandmother   . Cancer Brother     throat cancer (smoker)    Social History   Social History  . Marital status:  Single    Spouse name: N/A  . Number of children: N/A  . Years of education: N/A   Social History Main Topics  . Smoking status: Former Smoker    Quit date: 01/23/2001  . Smokeless tobacco: Former Systems developer  . Alcohol use 0.6 oz/week    1 Cans of beer per week  . Drug use: No  . Sexual activity: Not Currently    Birth control/ protection: None   Other Topics Concern  . None   Social History Narrative  . None    Review of Systems  Constitutional: Negative for fatigue and fever.  Respiratory: Negative for cough and shortness of breath.   Cardiovascular: Negative  for chest pain.  Gastrointestinal: Negative for abdominal pain.  Psychiatric/Behavioral: Negative for behavioral problems and confusion.       Communicates through writing    Vital Signs: BP (!) 144/48 (BP Location: Left Arm)   Pulse (!) 54   Temp 97.9 F (36.6 C) (Oral)   Resp 18   Ht 6' (1.829 m)   Wt 205 lb 0.4 oz (93 kg)   SpO2 100%   BMI 27.81 kg/m   Physical Exam  Constitutional: He is oriented to person, place, and time. He appears well-developed.  Cardiovascular: Normal rate, regular rhythm and normal heart sounds.   Pulmonary/Chest: Effort normal and breath sounds normal. No respiratory distress.  Abdominal: Soft.  Neurological: He is alert and oriented to person, place, and time.  Skin: Skin is warm and dry.  Psychiatric: He has a normal mood and affect. His behavior is normal. Judgment and thought content normal.  Able to demonstrate orientation through writing and with gestures. Cannot verbalize since stroke.  Nursing note and vitals reviewed.   Mallampati Score:  MD Evaluation Airway: WNL Heart: WNL Abdomen: WNL Chest/ Lungs: WNL ASA  Classification: 3 Mallampati/Airway Score: Two  Imaging: Ct Abdomen Wo Contrast  Result Date: 12/22/2016 CLINICAL DATA:  Cerebral infarction and evaluation prior to possible percutaneous gastrostomy tube placement. EXAM: CT ABDOMEN WITHOUT CONTRAST TECHNIQUE: Multidetector CT imaging of the abdomen was performed following the standard protocol without IV contrast. COMPARISON:  None. FINDINGS: Lower chest: No acute abnormality. Hepatobiliary: No focal liver abnormality is seen. No gallstones, gallbladder wall thickening, or biliary dilatation. Pancreas: Unremarkable. No pancreatic ductal dilatation or surrounding inflammatory changes. Spleen: Normal in size without focal abnormality. Adrenals/Urinary Tract: Adrenal glands are unremarkable. Kidneys are normal, without renal calculi, focal lesion, or hydronephrosis. Stomach/Bowel: No  evidence of bowel obstruction, ileus or free air. Relationship of the stomach and colon appears normal without evidence of significant colonic interposition between the abdominal wall and the gastric lumen. No evidence of hiatal hernia. Vascular/Lymphatic: No significant vascular findings are present. No enlarged abdominal or pelvic lymph nodes. Other: No abdominal wall hernia or abnormality. No ascites or focal fluid collections identified. Musculoskeletal: No acute or significant osseous findings. IMPRESSION: Bowel anatomy is amenable to placement of a percutaneous gastrostomy tube. There are no significant findings in the abdomen by unenhanced CT. Electronically Signed   By: Aletta Edouard M.D.   On: 12/22/2016 15:23   Ct Angio Head W Or Wo Contrast  Result Date: 12/15/2016 CLINICAL DATA:  Follow-up nonhemorrhagic pontine infarct. History of hypertension, lupus, hyperlipidemia and diabetes. EXAM: CT ANGIOGRAPHY HEAD AND NECK TECHNIQUE: Multidetector CT imaging of the head and neck was performed using the standard protocol during bolus administration of intravenous contrast. Multiplanar CT image reconstructions and MIPs were obtained to evaluate the vascular anatomy. Carotid  stenosis measurements (when applicable) are obtained utilizing NASCET criteria, using the distal internal carotid diameter as the denominator. CONTRAST:  50 cc Isovue 370 MRI of the head July 14, 2017 COMPARISON:  None. FINDINGS: CT HEAD FINDINGS BRAIN: No intraparenchymal hemorrhage, mass effect, midline shift. Old small bifrontal than small to moderate RIGHT parietal lobe infarcts. Old bilateral basal ganglia and LEFT cerebellar infarcts. Evolving nonhemorrhagic LEFT parasagittal pontine infarct. Moderate ventriculomegaly on the basis of global parenchymal brain volume loss. No abnormal extra-axial fluid collections. Basal cisterns are patent. VASCULAR: Moderate calcific atherosclerosis of the carotid siphons. SKULL: No skull  fracture. Old minimally depressed LEFT nasal bone fracture. No significant scalp soft tissue swelling. SINUSES/ORBITS: Trace ethmoid mucosal thickening. Mastoid air cells are well aerated. Soft tissue effacing the RIGHT and partially opacifying the LEFT ostiomeatal units most compatible with cerumen. The included ocular globes and orbital contents are non-suspicious. OTHER: None. CTA NECK AORTIC ARCH: Normal appearance of the thoracic arch, mild calcific atherosclerosis. Two vessel arch is a normal variant. The origins of the innominate, left Common carotid artery and subclavian artery are widely patent. RIGHT CAROTID SYSTEM: Common carotid artery is widely patent, mild luminal irregularity compatible with atherosclerosis. Normal appearance of the carotid bifurcation without hemodynamically significant stenosis by NASCET criteria. Small, irregular RIGHT internal carotid artery without dissection or focal stenosis. LEFT CAROTID SYSTEM: Common carotid artery is widely patent, mild luminal irregularity compatible with atherosclerosis. Normal appearance of the carotid bifurcation without hemodynamically significant stenosis by NASCET criteria, mild eccentric calcific atherosclerosis. Mild luminal irregularity of the LEFT cervical internal carotid artery. VERTEBRAL ARTERIES:LEFT vertebral artery occluded within 1 centimeter of the origin due to eccentric intimal thickening. Thready reconstitution distal LEFT V2 segment, occluded at distal LEFT V3 segment. RIGHT vertebral artery is widely patent. Mild extrinsic luminal narrowing due to degenerative cervical spine. SKELETON: No acute osseous process though bone windows have not been submitted. Poor dentition. Moderate degenerative change of the cervical spine. OTHER NECK: Soft tissues of the neck are non-acute though, not tailored for evaluation. CTA HEAD ANTERIOR CIRCULATION: Patent bilateral internal carotid arteries. Severe stenosis RIGHT para ophthalmic internal  carotid artery presumably from atherosclerosis. Occluded RIGHT supraclinoid internal carotid artery. Thready reconstitution of RIGHT carotid terminus and proximal RIGHT M1 segment with attenuated RIGHT needle cerebral artery including severe tandem stenoses RIGHT M1 segment. Diminutive RIGHT A1 segment with widely patent LEFT A1 segment, and anterior communicating artery. Patent bilateral anterior cerebral arteries with moderate luminal irregularity. LEFT middle cerebral artery is patent with moderate luminal irregularity. No contrast extravasation or aneurysm. POSTERIOR CIRCULATION: Occluded LEFT intradural vertebral artery. Moderate luminal irregularity RIGHT V4 segment. Basilar artery is patent. Posterior communicating arteries not present. Moderate luminal irregularity bilateral posterior cerebral arteries. No contrast extravasation or aneurysm. VENOUS SINUSES: Major dural venous sinuses are patent though not tailored for evaluation on this angiographic examination. ANATOMIC VARIANTS: None. DELAYED PHASE: No abnormal intracranial enhancement. IMPRESSION: CT HEAD: Evolving nonhemorrhagic LEFT pontine infarct. Multiple old small vessel and large vascular territory infarcts. CTA NECK: Occluded LEFT vertebral artery, thready distal V2 reconstitution, occluded at distal LEFT V3 segment. Mild long segment narrowing RIGHT cervical internal carotid artery attributable to atherosclerosis or, fibromuscular dysplasia. CTA HEAD:  Occluded LEFT vertebral artery. Occluded RIGHT supraclinoid internal carotid artery, reconstituted RIGHT carotid terminus with decreased RIGHT middle cerebral artery perfusion. Severely stenotic RIGHT M1 segment. Moderate intracranial atherosclerosis. Electronically Signed   By: Elon Alas M.D.   On: 12/15/2016 17:17   Ct Head Wo Contrast  Result  Date: 12/13/2016 CLINICAL DATA:  Expressive aphasia.  Fall. EXAM: CT HEAD WITHOUT CONTRAST TECHNIQUE: Contiguous axial images were obtained  from the base of the skull through the vertex without intravenous contrast. COMPARISON:  None. FINDINGS: Brain: Image quality degraded by motion Generalized mild atrophy. Multiple areas of chronic infarction involving the right frontal lobe and right parietal lobe. Chronic infarct in the left frontal lobe and in the left external capsule and periventricular white matter. Chronic infarct in the central pons. Chronic infarct in the left superior cerebellum. No acute infarct identified by CT. Negative for hemorrhage or mass. No shift of the midline structures. Vascular: No hyperdense vessel or unexpected calcification. Skull: Negative Sinuses/Orbits: Negative Other: None IMPRESSION: Extensive chronic ischemic changes as above. No acute intracranial abnormality. Image quality degraded by motion. Electronically Signed   By: Franchot Gallo M.D.   On: 12/13/2016 13:16   Ct Angio Neck W Or Wo Contrast  Result Date: 12/15/2016 CLINICAL DATA:  Follow-up nonhemorrhagic pontine infarct. History of hypertension, lupus, hyperlipidemia and diabetes. EXAM: CT ANGIOGRAPHY HEAD AND NECK TECHNIQUE: Multidetector CT imaging of the head and neck was performed using the standard protocol during bolus administration of intravenous contrast. Multiplanar CT image reconstructions and MIPs were obtained to evaluate the vascular anatomy. Carotid stenosis measurements (when applicable) are obtained utilizing NASCET criteria, using the distal internal carotid diameter as the denominator. CONTRAST:  50 cc Isovue 370 MRI of the head July 14, 2017 COMPARISON:  None. FINDINGS: CT HEAD FINDINGS BRAIN: No intraparenchymal hemorrhage, mass effect, midline shift. Old small bifrontal than small to moderate RIGHT parietal lobe infarcts. Old bilateral basal ganglia and LEFT cerebellar infarcts. Evolving nonhemorrhagic LEFT parasagittal pontine infarct. Moderate ventriculomegaly on the basis of global parenchymal brain volume loss. No abnormal  extra-axial fluid collections. Basal cisterns are patent. VASCULAR: Moderate calcific atherosclerosis of the carotid siphons. SKULL: No skull fracture. Old minimally depressed LEFT nasal bone fracture. No significant scalp soft tissue swelling. SINUSES/ORBITS: Trace ethmoid mucosal thickening. Mastoid air cells are well aerated. Soft tissue effacing the RIGHT and partially opacifying the LEFT ostiomeatal units most compatible with cerumen. The included ocular globes and orbital contents are non-suspicious. OTHER: None. CTA NECK AORTIC ARCH: Normal appearance of the thoracic arch, mild calcific atherosclerosis. Two vessel arch is a normal variant. The origins of the innominate, left Common carotid artery and subclavian artery are widely patent. RIGHT CAROTID SYSTEM: Common carotid artery is widely patent, mild luminal irregularity compatible with atherosclerosis. Normal appearance of the carotid bifurcation without hemodynamically significant stenosis by NASCET criteria. Small, irregular RIGHT internal carotid artery without dissection or focal stenosis. LEFT CAROTID SYSTEM: Common carotid artery is widely patent, mild luminal irregularity compatible with atherosclerosis. Normal appearance of the carotid bifurcation without hemodynamically significant stenosis by NASCET criteria, mild eccentric calcific atherosclerosis. Mild luminal irregularity of the LEFT cervical internal carotid artery. VERTEBRAL ARTERIES:LEFT vertebral artery occluded within 1 centimeter of the origin due to eccentric intimal thickening. Thready reconstitution distal LEFT V2 segment, occluded at distal LEFT V3 segment. RIGHT vertebral artery is widely patent. Mild extrinsic luminal narrowing due to degenerative cervical spine. SKELETON: No acute osseous process though bone windows have not been submitted. Poor dentition. Moderate degenerative change of the cervical spine. OTHER NECK: Soft tissues of the neck are non-acute though, not tailored  for evaluation. CTA HEAD ANTERIOR CIRCULATION: Patent bilateral internal carotid arteries. Severe stenosis RIGHT para ophthalmic internal carotid artery presumably from atherosclerosis. Occluded RIGHT supraclinoid internal carotid artery. Thready reconstitution of RIGHT carotid terminus  and proximal RIGHT M1 segment with attenuated RIGHT needle cerebral artery including severe tandem stenoses RIGHT M1 segment. Diminutive RIGHT A1 segment with widely patent LEFT A1 segment, and anterior communicating artery. Patent bilateral anterior cerebral arteries with moderate luminal irregularity. LEFT middle cerebral artery is patent with moderate luminal irregularity. No contrast extravasation or aneurysm. POSTERIOR CIRCULATION: Occluded LEFT intradural vertebral artery. Moderate luminal irregularity RIGHT V4 segment. Basilar artery is patent. Posterior communicating arteries not present. Moderate luminal irregularity bilateral posterior cerebral arteries. No contrast extravasation or aneurysm. VENOUS SINUSES: Major dural venous sinuses are patent though not tailored for evaluation on this angiographic examination. ANATOMIC VARIANTS: None. DELAYED PHASE: No abnormal intracranial enhancement. IMPRESSION: CT HEAD: Evolving nonhemorrhagic LEFT pontine infarct. Multiple old small vessel and large vascular territory infarcts. CTA NECK: Occluded LEFT vertebral artery, thready distal V2 reconstitution, occluded at distal LEFT V3 segment. Mild long segment narrowing RIGHT cervical internal carotid artery attributable to atherosclerosis or, fibromuscular dysplasia. CTA HEAD:  Occluded LEFT vertebral artery. Occluded RIGHT supraclinoid internal carotid artery, reconstituted RIGHT carotid terminus with decreased RIGHT middle cerebral artery perfusion. Severely stenotic RIGHT M1 segment. Moderate intracranial atherosclerosis. Electronically Signed   By: Elon Alas M.D.   On: 12/15/2016 17:17   Mr Brain Wo Contrast  Result  Date: 12/21/2016 CLINICAL DATA:  New Aphasia. EXAM: MRI HEAD WITHOUT CONTRAST TECHNIQUE: Multiplanar, multiecho pulse sequences of the brain and surrounding structures were obtained without intravenous contrast. COMPARISON:  12/14/2016 FINDINGS: Brain: Acute infarct in the upper pons has progressed. The infarct involves nearly the entire AP span of the pons and the entire anterior transverse span. No superimposed hemorrhage. Extensive chronic ischemic injury with multiple remote infarcts in the upper left cerebellum. Remote bifrontal and right parietal cortical infarcts that are moderate volume. Multiple small chronic cortical infarcts in the right MCA distribution. Chronic lacune in the upper left subinsular white matter with hemosiderin staining. No hydrocephalus. Vascular: Persistent loss of flow void in the left vertebral artery. The basilar remains patent. Skull and upper cervical spine: No marrow lesion. Sinuses/Orbits: Negative Other: These results were called by telephone at the time of interpretation on 12/21/2016 at 7:13 pm to Dr. Lindell Noe , who verbally acknowledged these results. IMPRESSION: 1. Large acute bilateral pontine infarct that has progressed since 12/14/2016. No superimposed hemorrhage. 2. Known loss of left vertebral flow. Normal-appearing basilar flow void. 3. Extensive chronic ischemic injury as described. Electronically Signed   By: Monte Fantasia M.D.   On: 12/21/2016 19:13   Mr Jeri Cos [id number] Contrast  Result Date: 12/14/2016 CLINICAL DATA:  Initial evaluation for acute speech difficulty, altered mental status, fall. EXAM: MRI HEAD WITHOUT AND WITH CONTRAST TECHNIQUE: Multiplanar, multiecho pulse sequences of the brain and surrounding structures were obtained without and with intravenous contrast. CONTRAST:  62mL MULTIHANCE GADOBENATE DIMEGLUMINE 529 MG/ML IV SOLN COMPARISON:  Prior CT from 12/13/2016. FINDINGS: Brain: Diffuse prominence of the CSF containing spaces is compatible with  generalized age-related cerebral atrophy. Patchy and confluent T2/FLAIR hyperintensity within the periventricular and deep white matter both cerebral hemispheres most compatible chronic microvascular ischemic disease. Multiple scattered areas of encephalomalacia are present within the anterior frontal lobes bilaterally, compatible with remote ischemic infarcts. Additional small remote right parietal and occipital lobe infarcts present as well. Remote lacunar infarcts involve the bilateral basal ganglia. Small amount of chronic hemorrhage noted with the remote left basal ganglia infarct. Scattered remote left cerebellar infarcts present as well. There is abnormal restricted diffusion involving the central and left paramedian aspect  of the pons, compatible with acute ischemic infarct (series 4, image 17). This is most predominant at the ventral aspect of the pons. No associated hemorrhage or significant mass effect. No other evidence for acute or subacute ischemia. Gray-white matter differentiation otherwise maintained. No mass lesion, midline shift, or mass effect. No hydrocephalus. No extra-axial fluid collection. Major dural sinuses are grossly patent. No abnormal enhancement. Pituitary gland and suprasellar region within normal limits. Vascular: Abnormal flow void within the distal left vertebral artery, which may be related to slow flow and/ or occlusion (series 7, image 3). This is suspected to be chronic in nature given the remote left cerebellar infarcts. Normal flow voids seen within the remainder the vertebrobasilar system. Intravascular flow voids preserved within the anterior circulation as well. Skull and upper cervical spine: Craniocervical junction within normal limits. Visualized upper cervical spine unremarkable. Bone marrow signal intensity within normal limits. No scalp soft tissue abnormality. Sinuses/Orbits: Globes and oval soft tissues within normal limits. Mild scattered mucosal thickening  within the ethmoidal air cells and maxillary sinuses. Paranasal sinuses are otherwise clear. No mastoid effusion. Inner ear structures grossly normal. Other: No other significant finding. IMPRESSION: 1. Acute ischemic nonhemorrhagic pontine infarct as above. No associated mass effect. 2. Multiple additional remote/chronic ischemic infarcts as detailed above. 3. Generalized age-related cerebral atrophy with moderate chronic microvascular ischemic disease. 4. Abnormal flow void within the distal left vertebral artery, compatible with slow flow and/or occlusion. This is suspected to be chronic in nature given the presence of multiple remote left cerebellar infarcts. Remainder of the intravascular flow voids are maintained. Electronically Signed   By: Jeannine Boga M.D.   On: 12/14/2016 22:15   Dg Abd Portable 1v  Result Date: 12/21/2016 CLINICAL DATA:  Enteric tube placement EXAM: PORTABLE ABDOMEN - 1 VIEW COMPARISON:  06/24/2007 CT abdomen/pelvis FINDINGS: No evidence of an enteric tube on this radiograph. No dilated small bowel loops. Mild retained oral contrast throughout the visualized large bowel. No evidence of pneumatosis or pneumoperitoneum. IMPRESSION: No evidence of an enteric tube on this radiograph. Nonobstructive bowel gas pattern. These results were called by telephone at the time of interpretation on 12/21/2016 at 8:19 pm to RN Octavia Bruckner, who verbally acknowledged these results. The RN stated that no enteric tube had been placed on this patient by the time of our conversation. Electronically Signed   By: Ilona Sorrel M.D.   On: 12/21/2016 20:22   Dg Swallowing Func-speech Pathology  Result Date: 12/16/2016 Objective Swallowing Evaluation: Type of Study: MBS-Modified Barium Swallow Study Patient Details Name: ESMAEL ZAPIEN MRN: [id number] Date of Birth: [birth year] Today's Date: 12/16/2016 Time: SLP Start Time (ACUTE ONLY): 1030-SLP Stop Time (ACUTE ONLY): 1051 SLP Time Calculation (min)  (ACUTE ONLY): 21 min Past Medical History: Past Medical History: Diagnosis Date . CHF (congestive heart failure) ([place])  . Diabetes mellitus  . Hyperlipidemia  . Lupus  Past Surgical History: No past surgical history on file. HPI: Blake Wilson is a 66 y.o. male presenting with weakness/AMS after fall. PMH is significant for CAD, HFrEF, Dilated Cardiomyopathy, HTN, T2DM with neuropathy, Equinus Deformity of Foot, Alcohol Abuse, SLE, Obesity, Glaucoma, and History of Low TSH. CT positive only for chronic areas of infarction including R frontal, R parietal, L frontal, L external capsule and periventricular white matter, L superior cerebellum and central pons. Found to have UTI. MRI 12/14/16 revealed acute ischemic nonhemorrhagic pontine infarct. Pt reportedly with new stutter, slowed speech. Bedside swallow eval completed 12/15/16--SLP observed explosive  coughing and s/s of aspiration. Recommended NPO until instrumental swallow eval (MBS). Subjective: Alert, pleasant, wet vocal quality Assessment / Plan / Recommendation [facility]L IP CLINICAL IMPRESSIONS 12/16/2016 Clinical Impression Mr. Flach exhibited a mild oral and pharyngeal dysphagia. Oral deficits including holding of bolus and delayed transit with thin liquids and barium pill with puree and mild prolonged mastication with regular solids. Pharyngeal phase impairments included penetration to the level of the vocal cords during trials of thin liquids via cup due to incomplete laryngeal closure. Penetration was not sensed by pt and verbal cues to cough/clear throat were ineffective in clearing penetrates. Educated pt re: compensatory strategy to utilize a chin tuck with cup sips of thin liquids. Chin tuck was effective in eliminating penetration during study and will likely reduce risk of penetration/aspiration in future. Observed pt with barium pill with puree; pt required extra bite of puree to aid in transit of barium pill through oral cavity and to clear pill  that lodged mid esophagus. Recommend Dys 3 solids (due to missing dentition), thin liquids (chin tuck), and meds whole with puree. Emphasized importance of utilizing chin tuck with thin liquids. ST will f/u for treatment to assess diet tolerance, safety/efficiency of swallow, and compliance with compensatory strategy. SLP Visit Diagnosis Dysphagia, oral phase (R13.11);Dysphagia, pharyngeal phase (R13.13);Dysphagia, pharyngoesophageal phase (R13.14) Attention and concentration deficit following -- Frontal lobe and executive function deficit following -- Impact on safety and function Moderate aspiration risk   [facility] IP TREATMENT RECOMMENDATION 12/16/2016 Treatment Recommendations Therapy as outlined in treatment plan below   Prognosis 12/16/2016 Prognosis for Safe Diet Advancement Good Barriers to Reach Goals Cognitive deficits Barriers/Prognosis Comment -- [facility] IP DIET RECOMMENDATION 12/16/2016 SLP Diet Recommendations Dysphagia 3 (Mech soft) solids;Thin liquid Liquid Administration via Cup;No straw;Other (Comment) Medication Administration Whole meds with puree Compensations Slow rate;Small sips/bites;Chin tuck Postural Changes Seated upright at 90 degrees   CHL IP OTHER RECOMMENDATIONS 12/16/2016 Recommended Consults -- Oral Care Recommendations Oral care BID Other Recommendations --   [facility] IP FOLLOW UP RECOMMENDATIONS 12/16/2016 Follow up Recommendations Skilled Nursing facility   [facility] IP FREQUENCY AND DURATION 12/16/2016 Speech Therapy Frequency (ACUTE ONLY) min 2x/week Treatment Duration 2 weeks      [facility] IP ORAL PHASE 12/16/2016 Oral Phase Impaired Oral - Pudding Teaspoon -- Oral - Pudding Cup -- Oral - Honey Teaspoon -- Oral - Honey Cup -- Oral - Nectar Teaspoon -- Oral - Nectar Cup -- Oral - Nectar Straw -- Oral - Thin Teaspoon -- Oral - Thin Cup Delayed oral transit;Holding of bolus Oral - Thin Straw -- Oral - Puree -- Oral - Mech Soft -- Oral - Regular Other (Comment) Oral - Multi-Consistency -- Oral - Pill Holding of  bolus;Delayed oral transit Oral Phase - Comment --  [facility]L IP PHARYNGEAL PHASE 12/16/2016 Pharyngeal Phase Impaired Pharyngeal- Pudding Teaspoon -- Pharyngeal -- Pharyngeal- Pudding Cup -- Pharyngeal -- Pharyngeal- Honey Teaspoon -- Pharyngeal -- Pharyngeal- Honey Cup -- Pharyngeal -- Pharyngeal- Nectar Teaspoon -- Pharyngeal -- Pharyngeal- Nectar Cup -- Pharyngeal -- Pharyngeal- Nectar Straw -- Pharyngeal -- Pharyngeal- Thin Teaspoon -- Pharyngeal -- Pharyngeal- Thin Cup Penetration/Aspiration during swallow;Reduced airway/laryngeal closure Pharyngeal Material enters airway, remains ABOVE vocal cords then ejected out;Material enters airway, CONTACTS cords and not ejected out Pharyngeal- Thin Straw -- Pharyngeal -- Pharyngeal- Puree -- Pharyngeal -- Pharyngeal- Mechanical Soft -- Pharyngeal -- Pharyngeal- Regular WFL Pharyngeal -- Pharyngeal- Multi-consistency -- Pharyngeal -- Pharyngeal- Pill WFL Pharyngeal -- Pharyngeal Comment --  CHL IP CERVICAL ESOPHAGEAL PHASE 12/16/2016 Cervical Esophageal  Phase WFL Pudding Teaspoon -- Pudding Cup -- Honey Teaspoon -- Honey Cup -- Nectar Teaspoon -- Nectar Cup -- Nectar Straw -- Thin Teaspoon -- Thin Cup -- Thin Straw -- Puree -- Mechanical Soft -- Regular -- Multi-consistency -- Pill -- Cervical Esophageal Comment -- No flowsheet data found. Houston Siren 12/16/2016, 12:37 PM [name] Colvin Caroli.Ed CCC-SLP Pager [phone]               Labs:  CBC:  Recent Labs  12/20/16 1727 12/21/16 0059 12/22/16 1101 12/23/16 0529  WBC 7.4 6.0 6.6 4.9  HGB 14.2 12.9* 14.5 13.5  HCT 42.5 38.3* 42.8 39.1  PLT 359 278 286 249    COAGS: No results for input(s): INR, APTT in the last 8760 hours.  BMP:  Recent Labs  12/20/16 1727 12/21/16 0059 12/22/16 1101 12/23/16 0529  NA 139 140 136 136  K 5.1 3.7 4.7 3.8  CL 104 108 108 107  CO2 22 20* 20* 26  GLUCOSE 93 94 142* 150*  BUN 35* 34* 12 6  CALCIUM 9.4 8.6* 9.0 8.4*  CREATININE 2.44* 1.70* 0.91 0.97    GFRNONAA 26* 40* >60 >60  GFRAA 30* 47* >60 >60    LIVER FUNCTION TESTS:  Recent Labs  01/26/16 0958 12/14/16 0254 12/20/16 1727 12/21/16 0059  BILITOT 0.5 0.8 0.7 0.9  AST 18 27 50* 39  ALT 36 19 94* 79*  ALKPHOS 81 67 79 70  PROT 6.5 6.3* 7.4 6.3*  ALBUMIN 3.7 3.1* 3.9 3.2*    TUMOR MARKERS: No results for input(s): AFPTM, CEA, CA199, CHROMGRNA in the last 8760 hours.  Assessment and Plan: Patient with history of recent CVA affecting his speech and swallow now with severe dysphagia and is unable to eat or drink at this time.  IR consulted for percutaneous gastrostomy tube placement at the request of Dr. Andria Frames.  He has been made NPO.  Blood thinners will be held until procedure.  Anticipate procedure tomorrow.  Have ordered INR.  Risks and benefits discussed with the patient including, but not limited to the need for a barium enema during the procedure, bleeding, infection, peritonitis, or damage to adjacent structures. All of the patient's questions were answered, patient is agreeable to proceed. Consent signed and in chart.  Thank you for this interesting consult.  I greatly enjoyed meeting NIKOLAY DUDIK and look forward to participating in their care.  A copy of this report was sent to the requesting provider on this date.  Electronically Signed: Docia Barrier 12/23/2016, 10:19 AM   I spent a total of 40 Minutes    in face to face in clinical consultation, greater than 50% of which was counseling/coordinating care for dysphagia.

## 2016-12-23 NOTE — Progress Notes (Signed)
Family Medicine Teaching Service Daily Progress Note Intern Pager: [phone]  Patient name: Blake Wilson Medical record number: [id number] Date of birth: [birth year] Age: 66 y.o. Gender: male  Primary Care Provider: Luiz Blare, DO Consultants: None  Code Status: FULL   Pt Overview and Major Events to Date:  3/2: Admitted for failure to thrive and AKI   Assessment and Plan: Blake Wilson is a 66 y.o. male presenting from [facility] facility for no PO intake since discharge found to have an AKI. PMH is significant for CAD, HFrEF, dilated cardiomyopathy, T2DM with neuropathy, alcohol abuse, and recent hospitalization for CVA/UTI/rhabdomyolysis.    CVA, Pontine infarction, Acute/Subacute: Acute ischemic nonhemorrhagic pontine infarct without mass effect and multiple chronic infarcts, originally seen on 2/24, reimaged on 3/3 with extension of lesion without concern for hemorrhage or edema. Residual deficits of dysarthria, expressive aphasia, and right sided weakness.  - MRI showed progressed bilateral pontine infarction (larger since 2/24 study) - Neurology Consult: appreciate recs, medically optimized with ASA and plavix once PEG surgery completed - holding ASA and plavix for PEG tube tomorrow - Consult IR: anticipate PEG tube 3/6 - PT/OT consult: SNF - SW consult for anticipated return to SNF at d/c   AKI, resolved: Cr at admission 2.44 (bl 0.8-1.0). Suspect pre-renal physiology given history of no PO intake x2 days prior to admission. CK was WNL. - Creatinine: 0.97 (3/5) - Continue IVFs  - Trend Cr  - Hold entresto, metformin, and lasix (nephrotoxic) >> until NGT or PEG tube placed - Avoid nephrotoxic agents   Failure to Thrive: Patient without any PO intake since discharge on 2/28. Evaluated by SLP at prior hospitalization. MBS performed and deemed patient moderate aspiration risk. Patient was discharged with a Dysphagia 2 diet. Patient was brought from [facility] for  evaluation for PEG tube.  - IVFs as above  - SLP consult: NPO pending PEG - IR consult for evaluation of PEG - consider addition of Remeron at bedtime if able to tolerate PO   Elevated LFTs: AST 50 and ALT 94 > AST 29 and ALT 79. CK normal. Patient is taking statin.  -continue to monitor   HFrEF: TEE at last admission with LVEF 40-45%. Coreg decreased from 25 mg BID to 6.25 mg BID at last hospitalization due to bradycardia. HR stable at [facility]. No signs of volume overload on exam.  - hold Entresto and Lasix due to AKI; holding coreg and imdur due to NPO - monitor volume status with IVFs   T2DM: Lantus dose decreased to 5u at recent hospitalization. CBGs stable.  -  Discontinue lantus while NPO - CBG AC/HS  - SSI - hold metformin   Alcohol Abuse: Patient denies EtOH use. However, from chart review of recent admission patient has history of alcohol use and most recent drink was reported to be 2/21.  -discontinue CIWA  -thiamine, folate, MVI   FEN/GI:  NS at 125 cc/hr, NPO Prophylaxis: Lovenox   Disposition: Pending improvement of AKI and ability to maintain nutritional status   Subjective:  Patient unable to vocalize this morning. Tried to have patient write notes, but he is unable to do so. Nods yes that he does want to get the PEG tube. Would like me to try to contact family. Denies pain. Is hungry.   Objective: Temp:  [97.6 F (36.4 C)-97.9 F (36.6 C)] 97.9 F (36.6 C) (03/05 0017) Pulse Rate:  [54-56] 54 (03/05 0017) Resp:  [18-22] 18 (03/05 0017) BP: (144)/(48) 144/48 (03/04  1227) SpO2:  [100 %] 100 % (03/05 0017) Physical Exam: General: lying in bed in NAD  Cardiovascular: RRR. No murmurs appreciated.  Respiratory: CTAB. Normal WOB.  Abdomen: soft, NTND  Extremities: No LE edema.   Laboratory:  Recent Labs Lab 12/21/16 0059 12/22/16 1101 12/23/16 0529  WBC 6.0 6.6 4.9  HGB 12.9* 14.5 13.5  HCT 38.3* 42.8 39.1  PLT 278 286 249    Recent  Labs Lab 12/20/16 1727 12/21/16 0059 12/22/16 1101 12/23/16 0529  NA 139 140 136 136  K 5.1 3.7 4.7 3.8  CL 104 108 108 107  CO2 22 20* 20* 26  BUN 35* 34* 12 6  CREATININE 2.44* 1.70* 0.91 0.97  CALCIUM 9.4 8.6* 9.0 8.4*  PROT 7.4 6.3*  --   --   BILITOT 0.7 0.9  --   --   ALKPHOS 79 70  --   --   ALT 94* 79*  --   --   AST 50* 39  --   --   GLUCOSE 93 94 142* 150*    Imaging/Diagnostic Tests: None  [name], MD 12/23/2016, 9:50 AM PGY-1, [place] Intern pager: [phone], text pages welcome

## 2016-12-23 NOTE — Consult Note (Signed)
   [facility] CM Inpatient Consult   12/23/2016  Blake Wilson [birth year] [id number]   Chart review for re-admission.  Plan.   Patient listed with [facility]age[facility] review reveals the patient is  Blake Wilson an 66 y.o.malerecently discharged after an admission for pontine CVA affecting speech and swallowing. He had residual deficits of dysarthria, expressive aphasia, and right sided weakness. He was on secondary stroke prevention with ASA, Plavix, and Lipitor. He was readmitted from [facility] facility for failure to thrive and severely decreased PO intake thought partially to be due to a further worsening in his swallowing function. AKI was noted on this admission, thought to be due to dehydration. His PMHxincludesCAD, multiple chronic ischemic infarctions, HFrEF, dilated cardiomyopathy, DM2, neuropathy and EtOH abuse.  Review also reveals that the patient is being recommended to return to skilled level facility for continued rehab.  Spoke with inpatient RNCM, Levada Dy who confirms discharge to be skilled nursing facility.  No acute [facility] Care Management needs noted at this time for community follow up.  For questions, please contact:  Natividad Brood, RN BSN [facility] Liaison  [phone] business mobile phone Toll free office [phone]

## 2016-12-24 DIAGNOSIS — N179 Acute kidney failure, unspecified: Secondary | ICD-10-CM

## 2016-12-24 LAB — CBC
HEMATOCRIT: 39.9 % (ref 39.0–52.0)
HEMOGLOBIN: 13.7 g/dL (ref 13.0–17.0)
MCH: 30.4 pg (ref 26.0–34.0)
MCHC: 34.3 g/dL (ref 30.0–36.0)
MCV: 88.7 fL (ref 78.0–100.0)
Platelets: 221 10*3/uL (ref 150–400)
RBC: 4.5 MIL/uL (ref 4.22–5.81)
RDW: 13.2 % (ref 11.5–15.5)
WBC: 3.9 10*3/uL — AB (ref 4.0–10.5)

## 2016-12-24 LAB — SURGICAL PCR SCREEN
MRSA, PCR: NEGATIVE
STAPHYLOCOCCUS AUREUS: NEGATIVE

## 2016-12-24 LAB — GLUCOSE, CAPILLARY
GLUCOSE-CAPILLARY: 168 mg/dL — AB (ref 65–99)
GLUCOSE-CAPILLARY: 149 mg/dL — AB (ref 65–99)
Glucose-Capillary: 159 mg/dL — ABNORMAL HIGH (ref 65–99)
Glucose-Capillary: 119 mg/dL — ABNORMAL HIGH (ref 65–99)

## 2016-12-24 LAB — BASIC METABOLIC PANEL
ANION GAP: 7 (ref 5–15)
BUN: <5 mg/dL — ABNORMAL LOW (ref 6–20)
CALCIUM: 8.5 mg/dL — AB (ref 8.9–10.3)
CHLORIDE: 109 mmol/L (ref 101–111)
CO2: 21 mmol/L — AB (ref 22–32)
Creatinine, Ser: 0.83 mg/dL (ref 0.61–1.24)
GFR calc non Af Amer: >60 mL/min (ref >60)
Glucose, Bld: 126 mg/dL — ABNORMAL HIGH (ref 65–99)
POTASSIUM: 3.4 mmol/L — AB (ref 3.5–5.1)
Sodium: 137 mmol/L (ref 135–145)

## 2016-12-24 LAB — PROTIME-INR
INR: 1.08
PROTHROMBIN TIME: 14 s (ref 11.4–15.2)

## 2016-12-24 MED ORDER — CEFAZOLIN SODIUM-DEXTROSE 2-4 GM/100ML-% IV SOLN
2.0000 g | Freq: Once | INTRAVENOUS | Status: AC
  Administered 2016-12-25: 2 g via INTRAVENOUS
  Filled 2016-12-24: qty 100

## 2016-12-24 NOTE — Progress Notes (Signed)
Family Medicine Teaching Service Daily Progress Note Intern Pager: [phone]  Patient name: MUBEEN AFFLERBACH Medical record number: [id number] Date of birth: 10-Jun-1951 Age: 66 y.o. Gender: male  Primary Care Provider: Luiz Blare, DO Consultants: None  Code Status: FULL   Pt Overview and Major Events to Date:  3/2: Admitted for failure to thrive and AKI  3/3: MRI with extension of stroke, NPO given no swallow reflex, patient refused NG 3/6: scheduled for PEG tube  Assessment and Plan: JARRIN PAETH is a 66 y.o. male presenting from [place] for no PO intake since discharge found to have an AKI. PMH is significant for CAD, HFrEF, dilated cardiomyopathy, T2DM with neuropathy, alcohol abuse, and recent hospitalization for CVA/UTI/rhabdomyolysis.    CVA, Pontine infarction, Acute/Subacute: Acute ischemic nonhemorrhagic pontine infarct without mass effect and multiple chronic infarcts, originally seen on 2/24, reimaged on 3/3 with extension of lesion without concern for hemorrhage or edema. Residual deficits of dysarthria, expressive aphasia, and right sided weakness.  - MRI showed progressed bilateral pontine infarction (larger since 2/24 study) - Neurology Consult: appreciate recs, medically optimized with ASA and plavix once PEG surgery completed - holding ASA and plavix for PEG tube 3/6 - Consult IR: anticipate PEG tube 3/6 - PT/OT consult: SNF - SW consult for anticipated return to SNF at d/c  - will consult SLP today for communication board  Bradycardia: patient well hydrated x 3 days with IVF despite NPO, yet persistently bradycardic without any meds given to cause such. Baseline from last admission 50s-60s. HR 48 this am.  -EKG now (prior with sinus pause)  AKI, resolved: Cr at admission 2.44 (bl 0.8-1.0). Suspect pre-renal physiology given history of no PO intake x2 days prior to admission. - Continue IVFs  - Avoid nephrotoxic agents   Failure to Thrive:  Patient without any PO intake since discharge on 2/28. Evaluated by SLP at prior hospitalization. MBS performed and deemed patient moderate aspiration risk. Patient was discharged with a Dysphagia 2 diet. Patient was brought from [facility] for evaluation for PEG tube.  - IVFs as above  - SLP consult: NPO pending PEG - IR consult for evaluation of PEG - consider addition of Remeron at bedtime if able to tolerate PO   Elevated LFTs: AST 50 and ALT 94 > AST 29 and ALT 79. CK normal. Patient was to be taking statin.  -continue to monitor   HFrEF: TEE at last admission with LVEF 40-45%. Coreg decreased from 25 mg BID to 6.25 mg BID at last hospitalization due to bradycardia. HR stable at [facility]. No signs of volume overload on exam.  - hold Entresto and Lasix due to AKI; holding coreg and imdur due to NPO - monitor volume status with IVFs   T2DM: Lantus dose decreased to 5u at recent hospitalization. CBGs stable.  -  Discontinue lantus while NPO - CBG AC/HS  - SSI - hold metformin   Alcohol Abuse: Patient denies EtOH use. However, from chart review of recent admission patient has history of alcohol use and most recent drink was reported to be 2/21.  -discontinue CIWA  -thiamine, folate, MVI   FEN/GI:  NS at 125 cc/hr, NPO Prophylaxis: Lovenox   Disposition: Pending improvement of AKI and ability to maintain nutritional status   Subjective:  Patient seems apprehensive about PEG today, but nods when asked repeatedly if he wants the PEG.  Objective: Temp:  [97.6 F (36.4 C)-97.9 F (36.6 C)] 97.9 F (36.6 C) (03/06 0551) Pulse Rate:  [  48-60] 48 (03/06 0551) Resp:  [18] 18 (03/06 0551) BP: (143-177)/(53-74) 177/53 (03/06 0551) SpO2:  [97 %-100 %] 100 % (03/06 0551) Physical Exam: General: lying in bed in NAD  Cardiovascular: RRR. No murmurs appreciated.  Respiratory: CTAB. Normal WOB.  Abdomen: soft, NTND  Extremities: No LE edema.   Laboratory:  Recent Labs Lab  12/22/16 1101 12/23/16 0529 12/24/16 0442  WBC 6.6 4.9 3.9*  HGB 14.5 13.5 13.7  HCT 42.8 39.1 39.9  PLT 286 249 221    Recent Labs Lab 12/20/16 1727 12/21/16 0059 12/22/16 1101 12/23/16 0529 12/24/16 0442  NA 139 140 136 136 137  K 5.1 3.7 4.7 3.8 3.4*  CL 104 108 108 107 109  CO2 22 20* 20* 26 21*  BUN 35* 34* 12 6 <5*  CREATININE 2.44* 1.70* 0.91 0.97 0.83  CALCIUM 9.4 8.6* 9.0 8.4* 8.5*  PROT 7.4 6.3*  --   --   --   BILITOT 0.7 0.9  --   --   --   ALKPHOS 79 70  --   --   --   ALT 94* 79*  --   --   --   AST 50* 39  --   --   --   GLUCOSE 93 94 142* 150* 126*    Imaging/Diagnostic Tests: None  [name], MD 12/24/2016, 8:16 AM PGY-1, [place] Intern pager: [phone], text pages welcome

## 2016-12-24 NOTE — Progress Notes (Signed)
  Speech Language Pathology Treatment: Cognitive-Linquistic  Patient Details Name: Blake Wilson MRN: [id number] DOB: Dec 15, 1950 Today's Date: 12/24/2016 Time: [id number] SLP Time Calculation (min) (ACUTE ONLY): 21 min  Assessment / Plan / Recommendation Clinical Impression  Initially, Blake Wilson did not produce vocalizations. As SLP provided visual (written) and phonemic cues, the pt was able to produce more words and phrases. Noted dysfluency and dysarthria significantly affecting speech intelligibility and communication of his wants/needs. Blake Wilson was able to complete several automatic speech tasks including the days of the week and months of the year. Phrase completions appeared beneficial as he was able to accurately answer questions when provided with this verbal prompt. Blake Wilson independently named 2/3 common objects and required phonemic cue for other object. SLP provided pt with a paper copy of a communication board (pictures of common hospital objects and needs) to assist with expressive language. Pt was able to accurately point to pictures on the board to communicate his wants/needs. Blake Wilson benefitted from writing single words when unable to verbally produce words. Educated pt re: Data processing manager and strategies for speech/language. ST will continue to f/u for language treatment.   HPI HPI: 66 y.o. male presenting from [facility] facility for no PO intake since discharge found to have large acute bilateral pontine infarct that has progressed since 12/14/2016 and AKI. PMH is significant for CAD, HFrEF, dilated cardiomyopathy, T2DM with neuropathy, alcohol abuse, and recent hospitalization for CVA/UTI/rhabdomyolysis.  Patient evaluated by ST 12/15/16 during recent admission, with cognitive communication impairment (deficits in memory, initiation, executive function, safety/judgment) and moderately reduced intelligibility due to low vocal intensity, wet vocal quality, and  mildly impaired articulation. Referred for cognitive linguistic and swallowing evaluation.       SLP Plan  Continue with current plan of care       Recommendations                   Oral Care Recommendations: Oral care QID Follow up Recommendations: Skilled Nursing facility SLP Visit Diagnosis: Aphasia (R47.01);Cognitive communication deficit (R41.841);Dysarthria and anarthria (R47.1) Plan: Continue with current plan of care       Blake Wilson , Blake Wilson 12/24/2016, 12:31 PM

## 2016-12-24 NOTE — Progress Notes (Signed)
STROKE TEAM PROGRESS NOTE   HISTORY OF PRESENT ILLNESS (per record) DEMARRIUS Wilson is an 66 y.o. male recently discharged after an admission for pontine CVA affecting speech and swallowing. He had residual deficits of dysarthria, expressive aphasia, and right sided weakness. He was on secondary stroke prevention with ASA, Plavix, and Lipitor. He was readmitted from [facility] for failure to thrive and severely decreased PO intake thought partially to be due to a further worsening in his swallowing function. AKI was noted on this admission, thought to be due to dehydration. His PMHx includes CAD, multiple chronic ischemic infarctions, HFrEF, dilated cardiomyopathy, DM2, neuropathy and EtOH abuse.   A follow up MRI was obtained, revealing extension of the previously seen pontine ischemic infarction.    SUBJECTIVE (INTERVAL HISTORY) Patient is awake but Now able to mouth a few and follows commands  No family at the bedside.   OBJECTIVE Temp:  [97.6 F (36.4 C)-97.9 F (36.6 C)] 97.7 F (36.5 C) (03/06 1508) Pulse Rate:  [48-52] 52 (03/06 1508) Resp:  [18] 18 (03/06 0551) BP: (143-177)/(53-74) 177/56 (03/06 1508) SpO2:  [97 %-100 %] 100 % (03/06 0551)  CBC:  Recent Labs Lab 12/20/16 1727  12/23/16 0529 12/24/16 0442  WBC 7.4  < > 4.9 3.9*  NEUTROABS 5.0  --   --   --   HGB 14.2  < > 13.5 13.7  HCT 42.5  < > 39.1 39.9  MCV 91.0  < > 89.7 88.7  PLT 359  < > 249 221  < > = values in this interval not displayed.  Basic Metabolic Panel:  Recent Labs Lab 12/20/16 1727  12/23/16 0529 12/24/16 0442  NA 139  < > 136 137  K 5.1  < > 3.8 3.4*  CL 104  < > 107 109  CO2 22  < > 26 21*  GLUCOSE 93  < > 150* 126*  BUN 35*  < > 6 <5*  CREATININE 2.44*  < > 0.97 0.83  CALCIUM 9.4  < > 8.4* 8.5*  MG 2.2  --   --   --   < > = values in this interval not displayed.  Lipid Panel:     Component Value Date/Time   CHOL 249 (H) 11/14/2016 0920   TRIG 146 11/14/2016  0920   HDL 42 11/14/2016 0920   CHOLHDL 5.9 (H) 11/14/2016 0920   VLDL 29 11/14/2016 0920   LDLCALC 178 (H) 11/14/2016 0920   HgbA1c:  Lab Results  Component Value Date   HGBA1C 10.0 11/05/2016   Urine Drug Screen:     Component Value Date/Time   LABOPIA NONE DETECTED 12/13/2016 1347   COCAINSCRNUR NONE DETECTED 12/13/2016 1347   LABBENZ NONE DETECTED 12/13/2016 1347   AMPHETMU NONE DETECTED 12/13/2016 1347   THCU NONE DETECTED 12/13/2016 1347   LABBARB NONE DETECTED 12/13/2016 1347      IMAGING  Mr Brain Wo Contrast 12/21/2016 1. Large acute bilateral pontine infarct that has progressed since 12/14/2016. No superimposed hemorrhage.  2. Known loss of left vertebral flow. Normal-appearing basilar flow void.  3. Extensive chronic ischemic injury as described.    Dg Abd Portable 1v 12/21/2016 No evidence of an enteric tube on this radiograph. Nonobstructive bowel gas pattern.     CTA Head and Neck 12/15/2016 CT HEAD:  Evolving nonhemorrhagic LEFT pontine infarct. Multiple old small vessel and large vascular territory infarcts.  CTA NECK:  Occluded LEFT vertebral artery, thready distal V2 reconstitution, occluded  at distal LEFT V3 segment. Mild long segment narrowing RIGHT cervical internal carotid artery attributable to atherosclerosis or, fibromuscular dysplasia.  CTA HEAD:   Occluded LEFT vertebral artery. Occluded RIGHT supraclinoid internal carotid artery, reconstituted RIGHT carotid terminus with decreased RIGHT middle cerebral artery perfusion. Severely stenotic RIGHT M1 segment. Moderate intracranial atherosclerosis.    PHYSICAL EXAM HEENT-  Normocephalic/atraumatic.  Lungs- Respirations unlabored Extremities- No edema.   Neurological Examination Mental Status: Awake and alert. Somewhat decreased attention. Mute. Able to speak a few words only with severe dysarthria Follows all motor commands. Holds up correct number of fingers when asked to perform  tasks Cranial Nerves: II: PERRL. blinks to threat bilaterally   III,IV, VI: ptosis not present, mild exotropia noted. Gazes to left and right without nystagmus.  V,VII: No facial droop, indicates that temperature sensation is equal bilaterally VIII: hearing intact to questions and commands IX,X: unable to visualize palate XI: no asymmetry XII: midline tongue extension Motor: Right :  Upper extremity   5/5                                       Left:     Upper extremity   5/5 Lower extremities: 4/5 hip flexion and knee extension on right, 4+ hip flexion and knee extension on right Increased tone bilateral lower extremities. Bulk normal x 4.                                                   Sensory: Indicates that temperature sensation is equal in upper and lower extremities. No extinction to double simultaneous stimulation with fine touch.  Deep Tendon Reflexes: Hypoactive upper and lower extremity reflexes.  Cerebellar: Slow FNF bilaterally without ataxia Gait: Deferred Other: Exaggerated startle reflex noted     ASSESSMENT/PLAN [name] is a 66 y.o. male with history of coronary artery disease, dilated cardiomyopathy, neuropathy, ETOH hx, previous stroke, lupus, hyperlipidemia, diabetes mellitus, and congestive heart failure  presenting with failure to thrive with decreased PO intake. He did not receive IV t-PA due to recent stroke.  Stroke:  Large acute bilateral pontine infarct that has progressed secondary to small vessel disease.   MRI - Large acute bilateral pontine infarct that has progressed since 12/14/2016.  MRA - not performed  Carotid Doppler - CTA neck 12/15/2016  2D Echo - 12/16/2016  TEE - 12/17/2016 - EF 40 - 45%. No cardiac source of emboli.  LDL - 11/14/2016 - 178  HgbA1C - 11/05/2016 - 10  VTE prophylaxis - Lovenox Diet NPO time specified  aspirin 325 mg daily and clopidogrel 75 mg daily prior to admission, now on aspirin 300 mg suppository daily  and clopidogrel 75 mg daily  Patient counseled to be compliant with his antithrombotic medications  Ongoing aggressive stroke risk factor management  Therapy recommendations: SNF (speech therapy recommends NPO)  Disposition: Pending  Hypertension  Stable  Permissive hypertension (OK if < 220/120) but gradually normalize in 5-7 days  Long-term BP goal normotensive  Hyperlipidemia  Home meds:  Lipitor 80 mg daily resumed in hospital  LDL 178 11/14/2016, goal < 70  Continue statin at discharge  Diabetes  HgbA1c 10 11/05/2016,  goal < 7.0  Uncontrolled  Other Stroke Risk Factors  Advanced  age  The patient quit smoking 15 years ago.  ETOH use, advised to drink no more than 1 drink per day  Overweight, Body mass index is 27.81 kg/m., recommend weight loss, diet and exercise as appropriate   Hx stroke/TIA  Family hx stroke (maternal grandmother)  Coronary artery disease   Other Active Problems  NPO -> NG tube ordered - pt declined. PEG planned.  Failure to thrive  UTI -> IV Rocephin started 12/21/2016  Hospital day # 4   Continue antiplatelet therapy and aggressive risk factor control. Unfortunately there is no more definitive treatment that we can offer at this time. Continue aspirin rectally until he is able to swallow then switched to aspirin and Plavix. Stroke team will sign off. Kindly call for questions. Antony Contras, MD Medical Director [facility] Pager: [phone] 12/24/2016 6:10 PM   To contact Stroke Continuity provider, please refer to [email or web address] After hours, contact General Neurology

## 2016-12-24 NOTE — Care Management Note (Addendum)
Case Management Note  Patient Details  Name: BENGAMIN ARRITT MRN: [id number] Date of Birth: September 07, 1951  Subjective/Objective:   Fall, Stroke                 Action/Plan: Discharge Planning: NCM attempted to speak to pt and able to say some words, but answers are not reliable. States he was from home, but from notes pt is from SNF-[facility]. Will continue to follow for dc needs. CSW for SNF placement.   Expected Discharge Date:  12/22/16               Expected Discharge Plan:  [place]  In-House Referral:  Social Worker  Discharge planning Services  CM Consult   Status of Service:  In process, will continue to follow  If discussed at Long Length of Stay Meetings, dates discussed:    Additional Comments:  Erenest Rasher, RN 12/24/2016, 2:29 PM

## 2016-12-24 NOTE — Progress Notes (Signed)
FMTS Attending Daily Note:  S:  Answers yes and no questions.  No pain.  Still NPO.  Becomes frustrated when trying to vocalize  O: Gen:  Alert, cooperative patient who appears stated age in no acute distress.  Vital signs reviewed. Heart:  RRR Lungs:  Clear anterior chest Abd:  Soft/benign Neuro:  Still aphasic.  Rubs his stomach when asked about PEG tube today and shakes his head.    Imp/Plan: 1. Pontine CVA: - main issue for hospitalization - receiving rectal ASA as antiplatelet - NPO due to degree of dysphagia from stroke. - Could we get him a communication board?  He nodded "yes" when asked if he wanted to spell out words.  Ask speech  2.  Failure to thrive: - secondary to #1 above.   - Needs nutrition.  PEG has been planned.   3.  AKI:  - resolved, likely secondary to CVA and inability to take PO  Other chronic issues per resident note, which I will sign when completed.    Alveda Reasons, MD 12/24/2016 9:04 AM

## 2016-12-24 NOTE — Progress Notes (Signed)
Physical Therapy Treatment Patient Details Name: Blake Wilson MRN: [id number] DOB: [birth year] Today's Date: 12/24/2016    History of Present Illness 66 y.o. male presenting from [facility] for no PO intake since discharge found to have an AKI. PMH is significant for CAD, HFrEF, dilated cardiomyopathy, T2DM with neuropathy, alcohol abuse, and recent hospitalization for CVA/UTI/rhabdomyolysis.      PT Comments    Pt requires min-mod assist with functional mobility tasks and presents with lateral lean to the R during gait and posterior lean when coming to a standing position.  These balance issues remain to limit mobility and increase risk for repeated fall.  Will continue to recommend SNF placement to improve balance and strength.  Plan next session to continue training with gait and functional mobility to improve ease.     Follow Up Recommendations  SNF     Equipment Recommendations  None recommended by PT    Recommendations for Other Services       Precautions / Restrictions Precautions Precautions: Fall Restrictions Weight Bearing Restrictions: No    Mobility  Bed Mobility Overal bed mobility: Needs Assistance Bed Mobility: Supine to Sit;Sit to Supine     Supine to sit: Supervision Sit to supine: Min assist   General bed mobility comments: +rail, cues for hand placement, assist to lift B LEs into bed.    Transfers Overall transfer level: Needs assistance Equipment used: Rolling walker (2 wheeled) Transfers: Sit to/from Stand Sit to Stand: Mod assist;Min assist (assist level varried from chair with B arms required min assist from edge of bed heavy mod assist with difficulty.  )         General transfer comment: increased time, assist to power up and stabilize initial standing balance. Pt remains retropulsive.  Ambulation/Gait Ambulation/Gait assistance: Min assist Ambulation Distance (Feet): 100 Feet (x2) Assistive device: Rolling walker (2  wheeled) Gait Pattern/deviations: Step-through pattern;Decreased stride length;Trunk flexed Gait velocity: decreased Gait velocity interpretation: Below normal speed for age/gender General Gait Details: Lateral lean to the R.  Cues to increase BOS and to step R.  Pt remains to require cues to stay close and at times requires assistance to do so.     Stairs            Wheelchair Mobility    Modified Rankin (Stroke Patients Only)       Balance Overall balance assessment: Needs assistance Sitting-balance support: No upper extremity supported;Feet supported Sitting balance-Leahy Scale: Fair   Postural control: Right lateral lean;Posterior lean   Standing balance-Leahy Scale: Poor Standing balance comment: Reliant on UE support during dynamic standing tasks.                    Cognition Arousal/Alertness: Awake/alert Behavior During Therapy: WFL for tasks assessed/performed Overall Cognitive Status: Difficult to assess                      Exercises      General Comments        Pertinent Vitals/Pain Pain Assessment: No/denies pain Pain Score: 0-No pain Faces Pain Scale: No hurt    Home Living                      Prior Function            PT Goals (current goals can now be found in the care plan section) Acute Rehab PT Goals Patient Stated Goal: not stated Potential to Achieve Goals:  Fair Additional Goals Additional Goal #1: Patient to demonstrate intellectual awareness of deficits by naming 2 issues with min questioning cues. Progress towards PT goals: Progressing toward goals    Frequency    Min 2X/week      PT Plan Current plan remains appropriate    Co-evaluation             End of Session Equipment Utilized During Treatment: Gait belt Activity Tolerance: Patient tolerated treatment well Patient left: in bed;with call bell/phone within reach;with bed alarm set Nurse Communication: Mobility status        Time: [id number] PT Time Calculation (min) (ACUTE ONLY): 23 min  Charges:  $Gait Training: 8-22 mins $Therapeutic Activity: 8-22 mins                    G Codes:       Cristela Blue 12-30-16, 4:53 PM Governor Rooks, PTA pager [phone]

## 2016-12-24 NOTE — Care Management Important Message (Signed)
Important Message  Patient Details  Name: Blake Wilson MRN: [id number] Date of Birth: 12-13-1950   Medicare Important Message Given:  Yes    [name] Abena 12/24/2016, 11:21 AM

## 2016-12-24 NOTE — Progress Notes (Deleted)
   [facility] Phone: [phone]   Date of Visit: 12/25/2016   HPI:  - was hospitalized last month for AMS and fall and was found to have: mild rhabdomyolysis, UTI, and CVA. Neurology recommended ASA 325mg  and Plavix 75mg  daily and to continue x3 months and then Plavix alone. PT/OT recommended SNF. SLP recommended dysphagia 3 diet. Cardiology recommended outpatient follow up with continuous ambulatory monitor. Neurology recommended  6 week follow up. Coreg was decreased to 6.25mg  BID from 25mg  BID due to bradycardia. Patient completed course of Ciprofloxacin for UTI. For DM, discharged on Lantus 5 units.   ROS: See HPI.  [place]:  PMH:  HTN CAD Congestive Dilated Cardiomyopathy Systolic Heart Failure  DM2 Equinus Deformity of Foot Onychomycosis Obesity  SLE (in remission)  PHYSICAL EXAM: There were no vitals taken for this visit. Gen: *** HEENT: *** Heart: *** Lungs: *** Neuro: *** Ext: ***  ASSESSMENT/PLAN:  Health maintenance:  -***  No problem-specific Assessment & Plan notes found for this encounter.  FOLLOW UP: Follow up in *** for ***  Smiley Houseman, MD PGY [place]

## 2016-12-25 ENCOUNTER — Inpatient Hospital Stay (HOSPITAL_COMMUNITY): Payer: PPO

## 2016-12-25 ENCOUNTER — Ambulatory Visit: Payer: PPO | Admitting: Internal Medicine

## 2016-12-25 ENCOUNTER — Encounter (HOSPITAL_COMMUNITY): Payer: Self-pay | Admitting: Interventional Radiology

## 2016-12-25 DIAGNOSIS — I10 Essential (primary) hypertension: Secondary | ICD-10-CM

## 2016-12-25 DIAGNOSIS — R627 Adult failure to thrive: Secondary | ICD-10-CM

## 2016-12-25 HISTORY — PX: IR GENERIC HISTORICAL: IMG1180011

## 2016-12-25 LAB — CBC
HEMATOCRIT: 42.4 % (ref 39.0–52.0)
HEMOGLOBIN: 14.5 g/dL (ref 13.0–17.0)
MCH: 30.5 pg (ref 26.0–34.0)
MCHC: 34.2 g/dL (ref 30.0–36.0)
MCV: 89.1 fL (ref 78.0–100.0)
Platelets: 219 10*3/uL (ref 150–400)
RBC: 4.76 MIL/uL (ref 4.22–5.81)
RDW: 13.5 % (ref 11.5–15.5)
WBC: 9 10*3/uL (ref 4.0–10.5)

## 2016-12-25 LAB — COMPREHENSIVE METABOLIC PANEL
ALK PHOS: 71 U/L (ref 38–126)
ALT: 53 U/L (ref 17–63)
AST: 44 U/L — ABNORMAL HIGH (ref 15–41)
Albumin: 2.9 g/dL — ABNORMAL LOW (ref 3.5–5.0)
Anion gap: 8 (ref 5–15)
CALCIUM: 8.4 mg/dL — AB (ref 8.9–10.3)
CO2: 19 mmol/L — ABNORMAL LOW (ref 22–32)
Chloride: 111 mmol/L (ref 101–111)
Creatinine, Ser: 0.81 mg/dL (ref 0.61–1.24)
Glucose, Bld: 119 mg/dL — ABNORMAL HIGH (ref 65–99)
Potassium: 5.4 mmol/L — ABNORMAL HIGH (ref 3.5–5.1)
Sodium: 138 mmol/L (ref 135–145)
Total Bilirubin: 1.6 mg/dL — ABNORMAL HIGH (ref 0.3–1.2)
Total Protein: 5.9 g/dL — ABNORMAL LOW (ref 6.5–8.1)

## 2016-12-25 LAB — GLUCOSE, CAPILLARY
Glucose-Capillary: 128 mg/dL — ABNORMAL HIGH (ref 65–99)
Glucose-Capillary: 141 mg/dL — ABNORMAL HIGH (ref 65–99)
Glucose-Capillary: 136 mg/dL — ABNORMAL HIGH (ref 65–99)

## 2016-12-25 MED ORDER — MIDAZOLAM HCL 2 MG/2ML IJ SOLN
INTRAMUSCULAR | Status: AC
  Filled 2016-12-25: qty 2

## 2016-12-25 MED ORDER — CLOPIDOGREL BISULFATE 75 MG PO TABS: 75.0000 mg | ORAL_TABLET | Freq: Every day | ORAL | Status: DC

## 2016-12-25 MED ORDER — SODIUM CHLORIDE 0.9 % IV SOLN
30.0000 meq | Freq: Once | INTRAVENOUS | Status: AC
  Administered 2016-12-25: 30 meq via INTRAVENOUS
  Filled 2016-12-25: qty 15

## 2016-12-25 MED ORDER — CLOPIDOGREL BISULFATE 75 MG PO TABS
75.0000 mg | ORAL_TABLET | Freq: Every day | ORAL | Status: DC
  Administered 2016-12-25 – 2016-12-27 (×3): 75 mg via ORAL
  Filled 2016-12-25 (×3): qty 1

## 2016-12-25 MED ORDER — AMLODIPINE BESYLATE 10 MG PO TABS
10.0000 mg | ORAL_TABLET | Freq: Every day | ORAL | Status: DC
  Administered 2016-12-26 – 2016-12-30 (×5): 10 mg
  Filled 2016-12-25 (×7): qty 1

## 2016-12-25 MED ORDER — IOPAMIDOL (ISOVUE-300) INJECTION 61%
INTRAVENOUS | Status: AC
  Administered 2016-12-25: 10 mL
  Filled 2016-12-25: qty 50

## 2016-12-25 MED ORDER — CEFAZOLIN SODIUM-DEXTROSE 2-4 GM/100ML-% IV SOLN
INTRAVENOUS | Status: AC
  Filled 2016-12-25: qty 100

## 2016-12-25 MED ORDER — FENTANYL CITRATE (PF) 100 MCG/2ML IJ SOLN
INTRAMUSCULAR | Status: AC | PRN
  Administered 2016-12-25: 50 ug via INTRAVENOUS

## 2016-12-25 MED ORDER — FENTANYL CITRATE (PF) 100 MCG/2ML IJ SOLN
INTRAMUSCULAR | Status: AC
  Filled 2016-12-25: qty 2

## 2016-12-25 MED ORDER — GLUCAGON HCL (RDNA) 1 MG IJ SOLR
INTRAMUSCULAR | Status: AC | PRN
  Administered 2016-12-25: 1 mg via INTRAVENOUS

## 2016-12-25 MED ORDER — ENOXAPARIN SODIUM 40 MG/0.4ML ~~LOC~~ SOLN
40.0000 mg | SUBCUTANEOUS | Status: DC
  Administered 2016-12-26 – 2016-12-31 (×6): 40 mg via SUBCUTANEOUS
  Filled 2016-12-25 (×7): qty 0.4

## 2016-12-25 MED ORDER — ASPIRIN 300 MG RE SUPP
300.0000 mg | Freq: Every day | RECTAL | Status: DC
  Filled 2016-12-25 (×2): qty 1

## 2016-12-25 MED ORDER — LIDOCAINE-EPINEPHRINE (PF) 1 %-1:200000 IJ SOLN
INTRAMUSCULAR | Status: AC
  Filled 2016-12-25: qty 30

## 2016-12-25 MED ORDER — ENOXAPARIN SODIUM 40 MG/0.4ML ~~LOC~~ SOLN: 40.0000 mg | SUBCUTANEOUS | Status: DC

## 2016-12-25 MED ORDER — GLUCAGON HCL RDNA (DIAGNOSTIC) 1 MG IJ SOLR
INTRAMUSCULAR | Status: AC
  Filled 2016-12-25: qty 1

## 2016-12-25 MED ORDER — ASPIRIN 325 MG PO TABS
325.0000 mg | ORAL_TABLET | Freq: Every day | ORAL | Status: DC
  Administered 2016-12-25 – 2016-12-29 (×5): 325 mg via ORAL
  Filled 2016-12-25 (×6): qty 1

## 2016-12-25 MED ORDER — MIDAZOLAM HCL 2 MG/2ML IJ SOLN
INTRAMUSCULAR | Status: AC | PRN
  Administered 2016-12-25: 1 mg via INTRAVENOUS

## 2016-12-25 NOTE — NC FL2 (Signed)
[place] LEVEL OF CARE SCREENING TOOL     IDENTIFICATION  Patient Name: Blake Wilson Birthdate: [birth year] Sex: male Admission Date (Current Location): 12/20/2016  [facility] and [place] Number:  Herbalist and Address:  [facility], [place] [address], [place], [place] [address]      Provider Number: [id number]  Attending Physician Name and Address:  Zenia Resides, MD  Relative Name and Phone Number:  Zada Girt [phone]    Current Level of Care: Hospital Recommended Level of Care: [place] Prior Approval Number:    Date Approved/Denied:   PASRR Number: [id number]  Discharge Plan: SNF    Current Diagnoses: Patient Active Problem List   Diagnosis Date Noted  . Failure to thrive in adult   . Late effect of cerebrovascular accident (CVA)   . Oropharyngeal dysphagia   . Failure to thrive (0-17) 12/20/2016  . AKI (acute kidney injury) ([place]) 12/20/2016  . Dehydration   . Abnormal EKG 12/16/2016  . Aphasia 12/16/2016  . CVA (cerebral vascular accident) ([place])   . Urinary tract infection with hematuria   . Elevated CK   . Low TSH level 06/13/2014  . Chronic systolic heart failure ([place]) 04/01/2014  . Pain in joint, ankle and foot 04/01/2014  . Pain in lower limb 12/22/2013  . Metatarsalgia of both feet 12/22/2013  . Equinus deformity of foot, acquired 12/22/2013  . Onychomycosis 12/22/2013  . Diabetic neuropathy, painful ([place]) 12/22/2013  . Discomfort of chest wall 10/26/2013  . Sleeping difficulty 10/07/2012  . Left leg pain 10/29/2011  . GOUT, UNSPECIFIED 06/26/2010  . GLAUCOMA 04/17/2009  . Elevated lipids 03/02/2009  . Coronary atherosclerosis 03/02/2009  . OBESITY 01/17/2009  . Alcohol abuse 01/09/2009  . Congestive dilated cardiomyopathy ([place]) 01/09/2009  . HYPERTENSION, BENIGN SYSTEMIC 12/18/2006  . SLE 12/18/2006    Orientation RESPIRATION BLADDER Height & Weight     Self  (Unable to speak)  Normal Incontinent, External catheter Weight: 93 kg (205 lb 0.4 oz) Height:  6' (182.9 cm)  BEHAVIORAL SYMPTOMS/MOOD NEUROLOGICAL BOWEL NUTRITION STATUS      Continent Diet, Feeding tube (Please see DC Summary)  AMBULATORY STATUS COMMUNICATION OF NEEDS Skin   Extensive Assist Verbally (Currently not able to speak) Normal                       Personal Care Assistance Level of Assistance  Bathing, Feeding, Dressing Bathing Assistance: Maximum assistance Feeding assistance: Maximum assistance Dressing Assistance: Maximum assistance     Functional Limitations Info  Speech, Hearing, Sight Sight Info: Adequate Hearing Info: Adequate Speech Info: Adequate    SPECIAL CARE FACTORS FREQUENCY  PT (By licensed PT), Speech therapy     PT Frequency: 5x/week       Speech Therapy Frequency: 3x/week      Contractures Contractures Info: Not present    Additional Factors Info  Code Status, Allergies Code Status Info: Full Allergies Info: NKA           Current Medications (12/25/2016):  This is the current hospital active medication list Current Facility-Administered Medications  Medication Dose Route Frequency Provider Last Rate Last Dose  . atorvastatin (LIPITOR) tablet 80 mg  80 mg Oral QHS Nicolette Bang, DO   80 mg at 12/21/16 0110  . ceFAZolin (ANCEF) IVPB 2g/100 mL premix  2 g Intravenous Once Docia Barrier, PA   Stopped at 12/24/16 1549  .  dextrose 5 %-0.45 % sodium chloride infusion   Intravenous Continuous [name], MD 125 mL/hr at 12/25/16 [phone]    . [START ON 12/26/2016] enoxaparin (LOVENOX) injection 40 mg  40 mg Subcutaneous Q24H Zenia Resides, MD      . folic acid (FOLVITE) tablet 1 mg  1 mg Oral Daily Nicolette Bang, DO      . gabapentin (NEURONTIN) capsule 300 mg  300 mg Oral QHS Nicolette Bang, DO   300 mg at 12/21/16 0110  . hydrALAZINE (APRESOLINE) injection 5 mg  5 mg Intravenous Q4H PRN  Eloise Levels, MD      . insulin aspart (novoLOG) injection 0-9 Units  0-9 Units Subcutaneous TID WC [name], MD   1 Units at 12/25/16 [phone]  . multivitamin with minerals tablet 1 tablet  1 tablet Oral Daily Nicolette Bang, DO      . polyethylene glycol (MIRALAX / GLYCOLAX) packet 17 g  17 g Oral Daily PRN Nicolette Bang, DO      . potassium chloride 30 mEq in sodium chloride 0.9 % 265 mL (KCL MULTIRUN) IVPB  30 mEq Intravenous Once Lovenia Kim, MD      . thiamine (VITAMIN B-1) tablet 100 mg  100 mg Oral Daily Nicolette Bang, DO      . Travoprost (BAK Free) (TRAVATAN) 0.004 % ophthalmic solution SOLN 1 drop  1 drop Both Eyes QHS Nicolette Bang, DO   1 drop at 12/24/16 2133     Discharge Medications: Please see discharge summary for a list of discharge medications.  Relevant Imaging Results:  Relevant Lab Results:   Additional Information [place], [place]

## 2016-12-25 NOTE — Sedation Documentation (Signed)
Patient is resting comfortably. 

## 2016-12-25 NOTE — Progress Notes (Signed)
Family Medicine Teaching Service Daily Progress Note Intern Pager: [phone]  Patient name: Blake Wilson Medical record number: [id number] Date of birth: 06-28-1951 Age: 66 y.o. Gender: male  Primary Care Provider: Luiz Blare, DO Consultants: IR, neurology (stroke) Code Status: FULL  Pt Overview and Major Events to Date:  3/2: Admitted for failure to thrive and AKI  3/3: MRI with extension of stroke, NPO given no swallow reflex, patient refused NG 3/6: PEG tube delayed, nutrition consulted for refeeding plans 3/7: scheduled for PEG tube  Assessment and Plan: Blake Galant Farrishis a 66 y.o.malepresenting from [place] for no PO intake since discharge found to have an AKI. PMH is significant for CAD, HFrEF, dilated cardiomyopathy, T2DM with neuropathy, alcohol abuse, and recent hospitalization for CVA/UTI/rhabdomyolysis.   CVA, Pontine infarction, Acute/Subacute: stable - Neurology signed off, medically optimized - holding ASA and plavix for PEG tube 3/7 - Consult IR: anticipate PEG tube 3/7 - SW consult for anticipated return to SNF at d/c   Bradycardia: patient well hydrated with IVF despite NPO, yet persistently bradycardic without any meds given to cause such. Baseline from last admission 50s-60s. EKG sinus brady 3/6.  - continue to monitor  Failure to Thrive, concern for refeeding syndrome: Patient without any PO intake since discharge on 2/28. Refused NG. PEG scheduled, but given lack of PO intake for >1 week, concerned for refeeding syndrome once feeding is initiated through PEG.  - nutrition consult placed, appreciate recs  Elevated LFTs: AST 50 and ALT 94 > AST 29 and ALT 79. CK normal. Patient was to be taking statin, held while NPO. -repeat CMP today  HFrEF: TEE at last admission with LVEF 40-45%. No concern for fluid overload at present. Holding all PO meds, no beta blocker at present due to bradycardia. - monitor volume status with IVFs    T2DM: Lantus dose decreased to 5u at recent hospitalization. CBGs stable.  -  Discontinue lantus while NPO - CBG AC/HS  - SSI - hold metformin   FEN/GI: D51/2 NS at 125 cc/hr, NPO Prophylaxis: Lovenox, held today for PEG  Disposition: SNF pending PEG and resolution of feeding without electrolyte changes  Subjective:  Patient still hungry today, no acute complaints. Using communication board.  Objective: Temp:  [97.5 F (36.4 C)-98 F (36.7 C)] 97.9 F (36.6 C) (03/07 0500) Pulse Rate:  [50-64] 64 (03/07 0500) Resp:  [18] 18 (03/07 0500) BP: (162-179)/(56-90) 162/90 (03/07 0500) SpO2:  [100 %] 100 % (03/07 0500) Physical Exam: General: lying in bed in NAD  Cardiovascular: RRR. No murmurs appreciated.  Respiratory: CTAB. Normal WOB.  Abdomen: soft, NTND  Extremities: No LE edema.   Laboratory:  Recent Labs Lab 12/22/16 1101 12/23/16 0529 12/24/16 0442  WBC 6.6 4.9 3.9*  HGB 14.5 13.5 13.7  HCT 42.8 39.1 39.9  PLT 286 249 221    Recent Labs Lab 12/20/16 1727 12/21/16 0059 12/22/16 1101 12/23/16 0529 12/24/16 0442  NA 139 140 136 136 137  K 5.1 3.7 4.7 3.8 3.4*  CL 104 108 108 107 109  CO2 22 20* 20* 26 21*  BUN 35* 34* 12 6 <5*  CREATININE 2.44* 1.70* 0.91 0.97 0.83  CALCIUM 9.4 8.6* 9.0 8.4* 8.5*  PROT 7.4 6.3*  --   --   --   BILITOT 0.7 0.9  --   --   --   ALKPHOS 79 70  --   --   --   ALT 94* 79*  --   --   --  AST 50* 39  --   --   --   GLUCOSE 93 94 142* 150* 126*    Imaging/Diagnostic Tests: No recent imaging.  [name], MD 12/25/2016, 7:24 AM PGY-1, [place] Intern pager: [phone], text pages welcome

## 2016-12-25 NOTE — Procedures (Signed)
Pre procedure Dx: Dysphagia Post Procedure Dx: Same  Successful fluoroscopic guided insertion of gastrostomy tube.   The gastrostomy tube may be used immediately for medications.   Tube feeds may be initiated in 24 hours as per the primary team.    EBL: Minimal  Complications: None immediate  Jay Shawnda Mauney, MD Pager #: [phone]    

## 2016-12-25 NOTE — Progress Notes (Signed)
Nutrition Follow-up  DOCUMENTATION CODES:   Not applicable  INTERVENTION:   -Once feeding access is established, recommend:  Initiate Jevity 1.2 @ 25 ml/hr via PEG and increase by 10 ml every 12 hours to goal rate of 65 ml/hr.   30 ml Prostat daily.    Tube feeding regimen provides 1972 kcal (100% of needs), 101 grams of protein, and 1259 ml of H2O.   If no IVFs, recommend 140 ml free water flush QID (provides additional 560 ml free water (1819 ml free water with inclusion of TF)).   -Recommend checking K, Mg, and Phos daily x 3 days and repleting as appropriate due to refeeding risk   NUTRITION DIAGNOSIS:   Inadequate oral intake related to inability to eat, dysphagia as evidenced by NPO status.  Ongoing  GOAL:   Patient will meet greater than or equal to 90% of their needs  Unmet  MONITOR:   Labs, Weight trends, Skin, I & O's  REASON FOR ASSESSMENT:   Consult Assessment of nutrition requirement/status  ASSESSMENT:   Blake Wilson is a 66 y.o. male presenting from [place] facility for no PO intake since discharge found to have an AKI. PMH is significant for CAD, HFrEF, dilated cardiomyopathy, T2DM with neuropathy, alcohol abuse, and recent hospitalization for CVA/UTI/rhabdomyolysis.    RD re-consulted for upcoming PEG placement and concern for refeeding risk. RD evaluated pt on 12/23/16; refer to initial assessment for further details.   Plan is for pt to undergo PEG placement today. TF recommendations are as above. K is slightly low; K and Phos not available to review at this time. Pt has been without adequate nutrition for approximately 7 days. Will start TF at low rate and advance slowly to minimize refeeding risk.   Labs reviewed: CBGS: 119-159, K: 3.4.   Diet Order:  Diet NPO time specified  Skin:  Reviewed, no issues  Last BM:  12/23/16  Height:   Ht Readings from Last 1 Encounters:  12/20/16 6' (1.829 m)    Weight:   Wt Readings  from Last 1 Encounters:  12/20/16 205 lb 0.4 oz (93 kg)    Ideal Body Weight:  80.9 kg  BMI:  Body mass index is 27.81 kg/m.  Estimated Nutritional Needs:   Kcal:  1800-2000  Protein:  90-105 grams  Fluid:  1.8-2.0 L  EDUCATION NEEDS:   Education needs addressed  Blake Wilson A. Jimmye Norman, RD, LDN, CDE Pager: [phone] After hours Pager: [phone]

## 2016-12-26 LAB — GLUCOSE, CAPILLARY
GLUCOSE-CAPILLARY: 138 mg/dL — AB (ref 65–99)
GLUCOSE-CAPILLARY: 140 mg/dL — AB (ref 65–99)
Glucose-Capillary: 141 mg/dL — ABNORMAL HIGH (ref 65–99)
Glucose-Capillary: 120 mg/dL — ABNORMAL HIGH (ref 65–99)

## 2016-12-26 LAB — BASIC METABOLIC PANEL
Anion gap: 5 (ref 5–15)
CHLORIDE: 109 mmol/L (ref 101–111)
CO2: 23 mmol/L (ref 22–32)
CREATININE: 0.85 mg/dL (ref 0.61–1.24)
Calcium: 8.4 mg/dL — ABNORMAL LOW (ref 8.9–10.3)
GFR calc Af Amer: >60 mL/min (ref >60)
GFR calc non Af Amer: >60 mL/min (ref >60)
Glucose, Bld: 146 mg/dL — ABNORMAL HIGH (ref 65–99)
Potassium: 3.4 mmol/L — ABNORMAL LOW (ref 3.5–5.1)
Sodium: 137 mmol/L (ref 135–145)

## 2016-12-26 LAB — CBC
HEMATOCRIT: 39.1 % (ref 39.0–52.0)
HEMOGLOBIN: 13.2 g/dL (ref 13.0–17.0)
MCH: 30.1 pg (ref 26.0–34.0)
MCHC: 33.8 g/dL (ref 30.0–36.0)
MCV: 89.1 fL (ref 78.0–100.0)
Platelets: 213 10*3/uL (ref 150–400)
RBC: 4.39 MIL/uL (ref 4.22–5.81)
RDW: 13.6 % (ref 11.5–15.5)
WBC: 5.6 10*3/uL (ref 4.0–10.5)

## 2016-12-26 LAB — MAGNESIUM: Magnesium: 1.3 mg/dL — ABNORMAL LOW (ref 1.7–2.4)

## 2016-12-26 LAB — PHOSPHORUS: Phosphorus: 3.2 mg/dL (ref 2.5–4.6)

## 2016-12-26 MED ORDER — POTASSIUM CHLORIDE 20 MEQ/15ML (10%) PO SOLN
40.0000 meq | Freq: Once | ORAL | Status: AC
  Administered 2016-12-26: 40 meq via ORAL
  Filled 2016-12-26: qty 30

## 2016-12-26 MED ORDER — JEVITY 1.2 CAL PO LIQD
1000.0000 mL | ORAL | Status: DC
  Administered 2016-12-26 – 2016-12-30 (×4): 1000 mL
  Filled 2016-12-26 (×5): qty 1000

## 2016-12-26 MED ORDER — MAGNESIUM SULFATE 2 GM/50ML IV SOLN
2.0000 g | Freq: Once | INTRAVENOUS | Status: AC
  Administered 2016-12-26: 2 g via INTRAVENOUS
  Filled 2016-12-26: qty 50

## 2016-12-26 NOTE — Progress Notes (Signed)
Physical Therapy Treatment Patient Details Name: Blake Wilson MRN: [id number] DOB: 1950-12-17 Today's Date: 12/26/2016    History of Present Illness 66 y.o. male presenting from [place] for no PO intake since discharge found to have an AKI. PMH is significant for CAD, HFrEF, dilated cardiomyopathy, T2DM with neuropathy, alcohol abuse, and recent hospitalization for CVA/UTI/rhabdomyolysis.      PT Comments    Pt performed increased mobility but requires significant assist to maintain balance.  Pt remains to present with posterior lean.   Continue to recommend SNF for continued rehab to improve strength and functional mobility before returning home.       Follow Up Recommendations  SNF     Equipment Recommendations  None recommended by PT    Recommendations for Other Services       Precautions / Restrictions Precautions Precautions: Fall Restrictions Weight Bearing Restrictions: No    Mobility  Bed Mobility Overal bed mobility: Needs Assistance Bed Mobility: Supine to Sit;Sit to Supine     Supine to sit: Supervision Sit to supine: Min assist   General bed mobility comments: +rail, cues for hand placement, assist to lift B LEs into bed.  Assist and tactile cueing to scoot patient to [facility].    Transfers Overall transfer level: Needs assistance Equipment used: Rolling walker (2 wheeled) Transfers: Sit to/from Stand Sit to Stand: Mod assist         General transfer comment: Heavy posterior lean when ascending to standing.  Cues to push from seated surface.  Pt required cues for eccentric loading, remains to present with poor eccentric loading despite cueing.     Ambulation/Gait Ambulation/Gait assistance: Min assist;Mod assist (assist level varried as patient fatigued began to require mod assist to correct RW and maintain standing.  ) Ambulation Distance (Feet): 200 Feet Assistive device: Rolling walker (2 wheeled) Gait Pattern/deviations:  Step-through pattern;Decreased stride length;Trunk flexed Gait velocity: decreased Gait velocity interpretation: Below normal speed for age/gender General Gait Details: Lateral lean to the R.  Cues to increase BOS and to step R.  Pt remains to require cues to stay close and at times requires assistance to do so.  Assist with obstacle negotiation.  Cues for R foot clearance.     Stairs            Wheelchair Mobility    Modified Rankin (Stroke Patients Only) Modified Rankin (Stroke Patients Only) Pre-Morbid Rankin Score: Slight disability Modified Rankin: Moderately severe disability     Balance Overall balance assessment: Needs assistance   Sitting balance-Leahy Scale: Fair       Standing balance-Leahy Scale: Poor                      Cognition Arousal/Alertness: Awake/alert Behavior During Therapy: WFL for tasks assessed/performed Overall Cognitive Status: Difficult to assess                      Exercises      General Comments        Pertinent Vitals/Pain Pain Assessment: No/denies pain Pain Score: 0-No pain Faces Pain Scale: No hurt    Home Living                      Prior Function            PT Goals (current goals can now be found in the care plan section) Acute Rehab PT Goals Patient Stated Goal: not stated Potential to  Achieve Goals: Fair Progress towards PT goals: Progressing toward goals    Frequency    Min 2X/week      PT Plan Current plan remains appropriate    Co-evaluation             End of Session Equipment Utilized During Treatment: Gait belt Activity Tolerance: Patient tolerated treatment well Patient left: in bed;with call bell/phone within reach;with bed alarm set Nurse Communication: Mobility status       Time: 9449-6759 PT Time Calculation (min) (ACUTE ONLY): 29 min  Charges:  $Gait Training: 8-22 mins $Therapeutic Activity: 8-22 mins                    G Codes:       Cristela Blue 2017-01-13, 5:24 PM Governor Rooks, PTA pager [phone]

## 2016-12-26 NOTE — Progress Notes (Signed)
Paged Family medicine to verify whether or not the 2200 meds can be given via peg tube instead orally as ordered .  Dr.  Reesa Chew responded and stated it was okay.

## 2016-12-26 NOTE — Progress Notes (Signed)
Family Medicine Teaching Service Daily Progress Note Intern Pager: [phone]  Patient name: Blake Wilson Medical record number: [id number] Date of birth: [birth year] Age: 66 y.o. Gender: male  Primary Care Provider: Luiz Blare, DO Consultants: IR, neurology (stroke) Code Status: FULL  Pt Overview and Major Events to Date:  3/2: Admitted for failure to thrive and AKI  3/3: MRI with extension of stroke, NPO given no swallow reflex, patient refused NG 3/6: PEG tube delayed, nutrition consulted for refeeding plans 3/7: scheduled for PEG tube  Assessment and Plan: Blake Wilson a 66 y.o.malepresenting from [place] for no PO intake since discharge found to have an AKI. PMH is significant for CAD, HFrEF, dilated cardiomyopathy, T2DM with neuropathy, alcohol abuse, and recent hospitalization for CVA/UTI/rhabdomyolysis.   Failure to Thrive, concern for refeeding syndrome: Patient without any PO intake since discharge on 2/28. Refused NG. PEG scheduled, but given lack of PO intake for >1 week, concerned for refeeding syndrome once feeding is initiated through PEG.  - nutrition consult placed, appreciate recs  CVA, Pontine infarction, Acute/Subacute: stable - Neurology signed off, medically optimized - holding ASA and plavix for PEG tube 3/7 - Consult IR: anticipate PEG tube 3/7 - SW consult for anticipated return to SNF at d/c   Elevated LFTs: AST 50 > 44 and ALT 94 > 53. CK normal. Patient was to be taking statin, held while NPO. - continue to monitor  HFrEF: TEE at last admission with LVEF 40-45%. No concern for fluid overload at present. Holding all PO meds, no beta blocker at present due to bradycardia. - monitor volume status with IVFs   T2DM: Lantus dose decreased to 5u at recent hospitalization. CBGs stable.  - hold Lantus due to good CBG control, even with restarting feeds - CBG AC/HS  - SSI - hold metformin   FEN/GI: D51/2 NS at 75 cc/hr,  tube feeds starting at 71ml/hr, NPO Prophylaxis: Lovenox, held today for PEG  Disposition: SNF pending PEG and resolution of feeding without electrolyte changes  Subjective:  Patient ready to have feeds started today. Denies feeling down. Does not want me to start an SSRI.   Objective: Temp:  [98 F (36.7 C)-98.5 F (36.9 C)] 98 F (36.7 C) (03/08 1409) Pulse Rate:  [67-91] 91 (03/08 1409) Resp:  [17-18] 17 (03/08 1409) BP: (138-191)/(58-74) 138/62 (03/08 1409) SpO2:  [100 %] 100 % (03/08 1409) Physical Exam: General: lying in bed in NAD  Cardiovascular: RRR. No murmurs appreciated.  Respiratory: CTAB. Normal WOB.  Abdomen: soft, NTND  Extremities: No LE edema.   Laboratory:  Recent Labs Lab 12/24/16 0442 12/25/16 2028 12/26/16 0801  WBC 3.9* 9.0 5.6  HGB 13.7 14.5 13.2  HCT 39.9 42.4 39.1  PLT 221 219 213    Recent Labs Lab 12/20/16 1727 12/21/16 0059  12/24/16 0442 12/25/16 1400 12/26/16 0801  NA 139 140  < > 137 138 137  K 5.1 3.7  < > 3.4* 5.4* 3.4*  CL 104 108  < > 109 111 109  CO2 22 20*  < > 21* 19* 23  BUN 35* 34*  < > <5* <5* <5*  CREATININE 2.44* 1.70*  < > 0.83 0.81 0.85  CALCIUM 9.4 8.6*  < > 8.5* 8.4* 8.4*  PROT 7.4 6.3*  --   --  5.9*  --   BILITOT 0.7 0.9  --   --  1.6*  --   ALKPHOS 79 70  --   --  71  --  ALT 94* 79*  --   --  53  --   AST 50* 39  --   --  44*  --   GLUCOSE 93 94  < > 126* 119* 146*  < > = values in this interval not displayed.  Imaging/Diagnostic Tests: No recent imaging.  [name], MD 12/26/2016, 3:37 PM PGY-1, [facility] Intern pager: [phone], text pages welcome

## 2016-12-26 NOTE — Progress Notes (Signed)
Patient ID: Blake Wilson, male   DOB: 1951/08/19, 66 y.o.   MRN: [id number]    Referring Physician(s): Dr. Madison Hickman  Supervising Physician: Sandi Mariscal  Patient Status: [facility] - In-pt  Chief Complaint: Failure to thrive, dysphagia  Subjective: Patient nonverbal with me today, but shakes his head no to pain or any new issues.  Allergies: Patient has no known allergies.  Medications: Prior to Admission medications   Medication Sig Start Date End Date Taking? Authorizing Provider  aspirin 325 MG tablet Take 325 mg by mouth daily.   Yes Historical Provider, MD  atorvastatin (LIPITOR) 80 MG tablet Take 1 tablet (80 mg total) by mouth at bedtime. 10/29/11  Yes Carolin Guernsey, MD  carvedilol (COREG) 3.125 MG tablet Take 3.125 mg by mouth 2 (two) times daily with a meal.   Yes Historical Provider, MD  ciprofloxacin (CIPRO) 250 MG tablet Take 250 mg by mouth 2 (two) times daily. Stop date: 12/21/16   Yes Historical Provider, MD  clopidogrel (PLAVIX) 75 MG tablet Take 1 tablet (75 mg total) by mouth daily. 12/19/16  Yes Everrett Coombe, MD  furosemide (LASIX) 40 MG tablet Take 1 tablet (40 mg total) by mouth daily. 01/26/16  Yes Smiley Houseman, MD  gabapentin (NEURONTIN) 300 MG capsule Take 1-3 tablets qhs as needed for lower extremity pain. Patient taking differently: Take 300 mg by mouth at bedtime. Take 1-3 tablets qhs as needed for lower extremity pain. 03/05/13  Yes Carolin Guernsey, MD  insulin glargine (LANTUS) 100 UNIT/ML injection Inject 0.05 mLs (5 Units total) into the skin at bedtime. 12/18/16  Yes Everrett Coombe, MD  isosorbide mononitrate (IMDUR) 30 MG 24 hr tablet Take 1 tablet (30 mg total) by mouth daily. 01/26/16  Yes Smiley Houseman, MD  metFORMIN (GLUCOPHAGE) 1000 MG tablet Take 1 tablet (1,000 mg total) by mouth 2 (two) times daily with a meal. 12/02/16  Yes Katheren Shams, DO  nitroGLYCERIN (NITROSTAT) 0.4 MG SL tablet Place 1 tablet (0.4 mg total) under the tongue every 5  (five) minutes as needed for chest pain. 01/26/16  Yes Smiley Houseman, MD  sacubitril-valsartan (ENTRESTO) 97-103 MG Take 1 tablet by mouth 2 (two) times daily.   Yes Historical Provider, MD  travoprost, benzalkonium, (TRAVATAN) 0.004 % ophthalmic solution Place 1 drop into both eyes at bedtime.    Yes Historical Provider, MD    Vital Signs: BP (!) 154/58 (BP Location: Right Arm)   Pulse 73   Temp 98 F (36.7 C) (Oral)   Resp 18   Ht 6' (1.829 m)   Wt 205 lb 0.4 oz (93 kg)   SpO2 100%   BMI 27.81 kg/m   Physical Exam: Abd: soft, NT, g-tube in place with no bleeding or erythema  Imaging: Ct Abdomen Wo Contrast  Result Date: 12/22/2016 CLINICAL DATA:  Cerebral infarction and evaluation prior to possible percutaneous gastrostomy tube placement. EXAM: CT ABDOMEN WITHOUT CONTRAST TECHNIQUE: Multidetector CT imaging of the abdomen was performed following the standard protocol without IV contrast. COMPARISON:  None. FINDINGS: Lower chest: No acute abnormality. Hepatobiliary: No focal liver abnormality is seen. No gallstones, gallbladder wall thickening, or biliary dilatation. Pancreas: Unremarkable. No pancreatic ductal dilatation or surrounding inflammatory changes. Spleen: Normal in size without focal abnormality. Adrenals/Urinary Tract: Adrenal glands are unremarkable. Kidneys are normal, without renal calculi, focal lesion, or hydronephrosis. Stomach/Bowel: No evidence of bowel obstruction, ileus or free air. Relationship of the stomach and colon appears normal  without evidence of significant colonic interposition between the abdominal wall and the gastric lumen. No evidence of hiatal hernia. Vascular/Lymphatic: No significant vascular findings are present. No enlarged abdominal or pelvic lymph nodes. Other: No abdominal wall hernia or abnormality. No ascites or focal fluid collections identified. Musculoskeletal: No acute or significant osseous findings. IMPRESSION: Bowel anatomy is amenable  to placement of a percutaneous gastrostomy tube. There are no significant findings in the abdomen by unenhanced CT. Electronically Signed   By: Aletta Edouard M.D.   On: 12/22/2016 15:23   Ir Gastrostomy Tube Mod Sed  Result Date: 12/25/2016 INDICATION: Dysphagia. Please place percutaneous gastrostomy tube for enteric nutrition supplementation. EXAM: PULL TROUGH GASTROSTOMY TUBE PLACEMENT COMPARISON:  Abdominal CT -12/22/2016 MEDICATIONS: Ancef 2 gm IV; Antibiotics were administered within 1 hour of the procedure. Glucagon 1 mg IV CONTRAST:  10 mL of Isovue 300 administered into the gastric lumen. ANESTHESIA/SEDATION: Moderate (conscious) sedation was employed during this procedure. A total of Versed 1 mg and Fentanyl 50 mcg was administered intravenously. Moderate Sedation Time: 10 minutes. The patient's level of consciousness and vital signs were monitored continuously by radiology nursing throughout the procedure under my direct supervision. FLUOROSCOPY TIME:  2 minutes 12 seconds (39 mGy) COMPLICATIONS: None immediate. PROCEDURE: Informed written consent was obtained from the patient following explanation of the procedure, risks, benefits and alternatives. A time out was performed prior to the initiation of the procedure. Ultrasound scanning was performed to demarcate the edge of the left lobe of the liver. Maximal barrier sterile technique utilized including caps, mask, sterile gowns, sterile gloves, large sterile drape, hand hygiene and Betadine prep. The left upper quadrant was sterilely prepped and draped. An oral gastric catheter was inserted into the stomach under fluoroscopy. The existing nasogastric feeding tube was removed. The left costal margin and air opacified transverse colon were identified and avoided. Air was injected into the stomach for insufflation and visualization under fluoroscopy. Under sterile conditions a 17 gauge trocar needle was utilized to access the stomach percutaneously  beneath the left subcostal margin after the overlying soft tissues were anesthetized with 1% Lidocaine with epinephrine. Needle position was confirmed within the stomach with aspiration of air and injection of small amount of contrast. A single T tack was deployed for gastropexy. Over an Amplatz guide wire, a 9-French sheath was inserted into the stomach. A snare device was utilized to capture the oral gastric catheter. The snare device was pulled retrograde from the stomach up the esophagus and out the oropharynx. The 20-French pull-through gastrostomy was connected to the snare device and pulled antegrade through the oropharynx down the esophagus into the stomach and then through the percutaneous tract external to the patient. The gastrostomy was assembled externally. Contrast injection confirms position in the stomach. Several spot radiographic images were obtained in various obliquities for documentation. The patient tolerated procedure well without immediate post procedural complication. FINDINGS: After successful fluoroscopic guided placement, the gastrostomy tube is appropriately positioned with internal disc against the ventral aspect of the gastric lumen. IMPRESSION: Successful fluoroscopic insertion of a 20-French pull-through gastrostomy tube. The gastrostomy may be used immediately for medication administration and in 24 hrs for the initiation of feeds. Electronically Signed   By: Sandi Mariscal M.D.   On: 12/25/2016 14:21    Labs:  CBC:  Recent Labs  12/23/16 0529 12/24/16 0442 12/25/16 2028 12/26/16 0801  WBC 4.9 3.9* 9.0 5.6  HGB 13.5 13.7 14.5 13.2  HCT 39.1 39.9 42.4 39.1  PLT 249  221 219 213    COAGS:  Recent Labs  12/24/16 0941  INR 1.08    BMP:  Recent Labs  12/23/16 0529 12/24/16 0442 12/25/16 1400 12/26/16 0801  NA 136 137 138 137  K 3.8 3.4* 5.4* 3.4*  CL 107 109 111 109  CO2 26 21* 19* 23  GLUCOSE 150* 126* 119* 146*  BUN 6 <5* <5* <5*  CALCIUM 8.4* 8.5*  8.4* 8.4*  CREATININE 0.97 0.83 0.81 0.85  GFRNONAA >60 >60 >60 >60  GFRAA >60 >60 >60 >60    LIVER FUNCTION TESTS:  Recent Labs  12/14/16 0254 12/20/16 1727 12/21/16 0059 12/25/16 1400  BILITOT 0.8 0.7 0.9 1.6*  AST 27 50* 39 44*  ALT 19 94* 79* 53  ALKPHOS 67 79 70 71  PROT 6.3* 7.4 6.3* 5.9*  ALBUMIN 3.1* 3.9 3.2* 2.9*    Assessment and Plan: 1. Dysphagia, s/p g-tube placement -site looks good. -may use tube today for feeds and medications -flush as per protocol with g-tube -call with questions  Electronically Signed: Eva Vallee E 12/26/2016, 9:38 AM   I spent a total of 15 Minutes at the the patient's bedside AND on the patient's hospital floor or unit, greater than 50% of which was counseling/coordinating care for dysphagia

## 2016-12-27 LAB — BASIC METABOLIC PANEL
ANION GAP: 8 (ref 5–15)
BUN: <5 mg/dL — ABNORMAL LOW (ref 6–20)
CHLORIDE: 108 mmol/L (ref 101–111)
CO2: 23 mmol/L (ref 22–32)
Calcium: 8.8 mg/dL — ABNORMAL LOW (ref 8.9–10.3)
Creatinine, Ser: 0.81 mg/dL (ref 0.61–1.24)
GFR calc non Af Amer: >60 mL/min (ref >60)
Glucose, Bld: 125 mg/dL — ABNORMAL HIGH (ref 65–99)
POTASSIUM: 3.6 mmol/L (ref 3.5–5.1)
Sodium: 139 mmol/L (ref 135–145)

## 2016-12-27 LAB — MAGNESIUM: Magnesium: 1.7 mg/dL (ref 1.7–2.4)

## 2016-12-27 LAB — PHOSPHORUS: Phosphorus: 3.5 mg/dL (ref 2.5–4.6)

## 2016-12-27 LAB — GLUCOSE, CAPILLARY
GLUCOSE-CAPILLARY: 111 mg/dL — AB (ref 65–99)
GLUCOSE-CAPILLARY: 133 mg/dL — AB (ref 65–99)
GLUCOSE-CAPILLARY: 119 mg/dL — AB (ref 65–99)
Glucose-Capillary: 88 mg/dL (ref 65–99)

## 2016-12-27 MED ORDER — PRO-STAT SUGAR FREE PO LIQD
30.0000 mL | Freq: Two times a day (BID) | ORAL | Status: DC
  Administered 2016-12-27 – 2016-12-29 (×4): 30 mL via ORAL
  Filled 2016-12-27 (×6): qty 30

## 2016-12-27 MED ORDER — FREE WATER
140.0000 mL | Freq: Four times a day (QID) | Status: DC
  Administered 2016-12-27 – 2016-12-31 (×18): 140 mL

## 2016-12-27 MED ORDER — LISINOPRIL 5 MG PO TABS
5.0000 mg | ORAL_TABLET | Freq: Every day | ORAL | Status: DC
  Administered 2016-12-28: 5 mg
  Filled 2016-12-27: qty 1

## 2016-12-27 MED ORDER — FOLIC ACID 1 MG PO TABS
1.0000 mg | ORAL_TABLET | Freq: Every day | ORAL | Status: DC
  Administered 2016-12-28 – 2016-12-31 (×4): 1 mg
  Filled 2016-12-27 (×4): qty 1

## 2016-12-27 MED ORDER — POTASSIUM CHLORIDE 20 MEQ/15ML (10%) PO SOLN
40.0000 meq | Freq: Two times a day (BID) | ORAL | Status: DC
  Administered 2016-12-27 – 2016-12-30 (×6): 40 meq
  Filled 2016-12-27 (×6): qty 30

## 2016-12-27 MED ORDER — MAGNESIUM SULFATE 2 GM/50ML IV SOLN
2.0000 g | Freq: Once | INTRAVENOUS | Status: AC
  Administered 2016-12-27: 2 g via INTRAVENOUS
  Filled 2016-12-27: qty 50

## 2016-12-27 MED ORDER — ATORVASTATIN CALCIUM 80 MG PO TABS
80.0000 mg | ORAL_TABLET | Freq: Every day | ORAL | Status: DC
  Administered 2016-12-27 – 2016-12-30 (×4): 80 mg
  Filled 2016-12-27 (×4): qty 1

## 2016-12-27 MED ORDER — CLOPIDOGREL BISULFATE 75 MG PO TABS
75.0000 mg | ORAL_TABLET | Freq: Every day | ORAL | Status: DC
  Administered 2016-12-28 – 2016-12-31 (×4): 75 mg
  Filled 2016-12-27 (×4): qty 1

## 2016-12-27 MED ORDER — GABAPENTIN 300 MG PO CAPS
300.0000 mg | ORAL_CAPSULE | Freq: Every day | ORAL | Status: DC
  Administered 2016-12-27 – 2016-12-30 (×4): 300 mg
  Filled 2016-12-27 (×4): qty 1

## 2016-12-27 MED ORDER — POTASSIUM CHLORIDE 20 MEQ/15ML (10%) PO SOLN
40.0000 meq | Freq: Two times a day (BID) | ORAL | Status: DC
  Administered 2016-12-27: 40 meq via ORAL
  Filled 2016-12-27: qty 30

## 2016-12-27 MED ORDER — LISINOPRIL 5 MG PO TABS
5.0000 mg | ORAL_TABLET | Freq: Every day | ORAL | Status: DC
  Administered 2016-12-27: 5 mg via ORAL
  Filled 2016-12-27: qty 1

## 2016-12-27 NOTE — Progress Notes (Signed)
Nutrition Follow-up  DOCUMENTATION CODES:   Not applicable  INTERVENTION:   Continue Jevity 1.2 @ 25 ml/hr via PEG and recommend increase by 10 ml every 12 hours to goal rate of 65 ml/hr.   30 ml Prostat daily.    Tube feeding regimen provides 1972 kcal (100% of needs), 101 grams of protein, and 1259 ml of H2O.   If no IVFs, recommend 140 ml free water flush QID (provides additional 560 ml free water (1819 ml free water with inclusion of TF)).   NUTRITION DIAGNOSIS:   Inadequate oral intake related to inability to eat, dysphagia as evidenced by NPO status.  Ongoing  GOAL:   Patient will meet greater than or equal to 90% of their needs  Progressing  MONITOR:   Labs, Weight trends, Skin, I & O's  REASON FOR ASSESSMENT:   Consult Assessment of nutrition requirement/status  ASSESSMENT:   Blake Wilson is a 66 y.o. male presenting from [place] facility for no PO intake since discharge found to have an AKI. PMH is significant for CAD, HFrEF, dilated cardiomyopathy, T2DM with neuropathy, alcohol abuse, and recent hospitalization for CVA/UTI/rhabdomyolysis.    12/25/16- s/p PEG placement  TF initiated on 12/26/16 at 1748. Jevity 1.2 is currently infusing via PEG @ 25 ml/hr. Noted 30 ml Prostat ordered BID and 140 ml free water flushes ordered QID. Complete TF regimen currently providing 920 kcals, 63 grams protein, and 1044 ml fluid daily, meeting 51% of estimated kcal needs and 70% of estimated protein needs.   Spoke with pt at bedside, who reports no nausea, vomiting,or abdominal pain.   Case discussed with RN, who confirmed pt tolerating TF well. Plan to continue to advance slowly to minimize refeeding risk. Per RN, plan to d/c back to SNF on Monday, 12/30/16.   Labs reviewed: CBGS: 114-140. K, Mg, and Phos WDL.  Diet Order:   NPO  Skin:  Reviewed, no issues  Last BM:  12/24/16  Height:   Ht Readings from Last 1 Encounters:  12/20/16 6' (1.829 m)     Weight:   Wt Readings from Last 1 Encounters:  12/20/16 205 lb 0.4 oz (93 kg)    Ideal Body Weight:  80.9 kg  BMI:  Body mass index is 27.81 kg/m.  Estimated Nutritional Needs:   Kcal:  1800-2000  Protein:  90-105 grams  Fluid:  1.8-2.0 L  EDUCATION NEEDS:   Education needs addressed  Adaeze Better A. Jimmye Norman, RD, LDN, CDE Pager: [phone] After hours Pager: [phone]

## 2016-12-27 NOTE — Care Management Important Message (Signed)
Important Message  Patient Details  Name: Blake Wilson MRN: [id number] Date of Birth: Jul 09, 1951   Medicare Important Message Given:  Yes    Nathen May 12/27/2016, 2:23 PM

## 2016-12-27 NOTE — Progress Notes (Signed)
  Speech Language Pathology Treatment: Dysphagia;Cognitive-Linquistic  Patient Details Name: Blake Wilson MRN: [id number] DOB: [birth year] Today's Date: 12/27/2016 Time: 9323-5573 SLP Time Calculation (min) (ACUTE ONLY): 15 min  Assessment / Plan / Recommendation Clinical Impression  Provided oral care prior to ice chip trials during dysphagia intervention resulting in an immediate cough with one of  three trials. Verbal and demonstration cues to produce volitional hard cough, phonation during resistance to increase laryngeal closure and pitch glides to elicit laryngeal elevation, sustained phonation which required moderate verbal, visual, and tactile cueing from SLP. Dysfluency and dysarthria continue to significantly impact Mr. Ide speech intelligibility and ability to communicate wants/needs. Noted less aphasia and word-finding difficulties during today's treatment session. Educated pt re: useful strategies to improve intelligibility (increase volume, slow rate, overarticulate) and dysfluencies (slow down, start over). ST will continue to f/u for treatment. Pt would benefit from continued speech therapy at next venue of care.   HPI HPI: 66 y.o. male presenting from [place] facility for no PO intake since discharge found to have large acute bilateral pontine infarct that has progressed since 12/14/2016 and AKI. PMH is significant for CAD, HFrEF, dilated cardiomyopathy, T2DM with neuropathy, alcohol abuse, and recent hospitalization for CVA/UTI/rhabdomyolysis.  Patient evaluated by ST 12/15/16 during recent admission, with cognitive communication impairment (deficits in memory, initiation, executive function, safety/judgment) and moderately reduced intelligibility due to low vocal intensity, wet vocal quality, and mildly impaired articulation. Referred for cognitive linguistic and swallowing evaluation.       SLP Plan  Continue with current plan of care       Recommendations   Diet recommendations: NPO Medication Administration: Via alternative means                Oral Care Recommendations: Oral care QID Follow up Recommendations: Skilled Nursing facility SLP Visit Diagnosis: Dysphagia, unspecified (R13.10);Dysarthria and anarthria (R47.1);Aphasia (R47.01) Plan: Continue with current plan of care       [place] , [place] 12/27/2016, 12:29 PM

## 2016-12-27 NOTE — Progress Notes (Signed)
Family Medicine Teaching Service Daily Progress Note Intern Pager: [phone]  Patient name: Blake Wilson Medical record number: [id number] Date of birth: May 01, 1951 Age: 66 y.o. Gender: male  Primary Care Provider: Luiz Blare, DO Consultants: IR, neurology (stroke) Code Status: FULL  Pt Overview and Major Events to Date:  3/2: Admitted for failure to thrive and AKI  3/3: MRI with extension of stroke, NPO given no swallow reflex, patient refused NG 3/6: PEG tube delayed, nutrition consulted for refeeding plans 3/7: PEG placed 3/8: feeding resumed  Assessment and Plan: Blake Wilson a 66 y.o.malepresenting from [place] for no PO intake since discharge found to have an AKI. PMH is significant for CAD, HFrEF, dilated cardiomyopathy, T2DM with neuropathy, alcohol abuse, and recent hospitalization for CVA/UTI/rhabdomyolysis.   Failure to Thrive, concern for refeeding syndrome: PEG 3/7, no evidence of refeeding syndrome at present. - nutrition consult placed, appreciate recs: 91ml prostat BID, 134ml free water flushes QID, Jevity 26ml increasing 67ml q12H to goal of 52ml/hr - BMP, Mg, Phos daily  CVA, Pontine infarction, Acute/Subacute: stable - Neurology signed off, medically optimized - holding ASA and plavix for PEG tube 3/7 - Consult IR: anticipate PEG tube 3/7 - SW consult for anticipated return to SNF at d/c   Elevated LFTs: AST 50 > 44 and ALT 94 > 53. CK normal. Patient was to be taking statin, held while NPO. - continue to monitor  HFrEF: TEE at last admission with LVEF 40-45%. No concern for fluid overload at present. Holding all PO meds, no beta blocker at present due to bradycardia. - d/c IVFs  T2DM: Lantus dose decreased to 5u at recent hospitalization. CBGs stable.  - hold Lantus due to good CBG control, even with restarting feeds - CBG QD - d/c SSI - hold metformin   FEN/GI:  tube feeds starting at 82ml/hr and increasing by 46mL  to goal of 46ml/hr, NPO Prophylaxis: Lovenox  Disposition: SNF pending PEG and resolution of feeding without electrolyte changes  Subjective:  Patient denies complaints, would like for me to try to contact his friend again. Declines chaplain consult.   Objective: Temp:  [98 F (36.7 C)-98.2 F (36.8 C)] 98 F (36.7 C) (03/09 0998) Pulse Rate:  [64-91] 64 (03/09 0608) Resp:  [17-18] 18 (03/09 0608) BP: (138-191)/(62-79) 151/74 (03/09 0608) SpO2:  [98 %-100 %] 98 % (03/09 3382) Physical Exam: General: lying in bed in NAD  Cardiovascular: RRR. No murmurs appreciated.  Respiratory: CTAB. Normal WOB.  Abdomen: soft, NTND. PEG tube without erythema or bleeding Extremities: No LE edema.   Laboratory:  Recent Labs Lab 12/24/16 0442 12/25/16 2028 12/26/16 0801  WBC 3.9* 9.0 5.6  HGB 13.7 14.5 13.2  HCT 39.9 42.4 39.1  PLT 221 219 213    Recent Labs Lab 12/20/16 1727 12/21/16 0059  12/25/16 1400 12/26/16 0801 12/27/16 0750  NA 139 140  < > 138 137 139  K 5.1 3.7  < > 5.4* 3.4* 3.6  CL 104 108  < > 111 109 108  CO2 22 20*  < > 19* 23 23  BUN 35* 34*  < > <5* <5* <5*  CREATININE 2.44* 1.70*  < > 0.81 0.85 0.81  CALCIUM 9.4 8.6*  < > 8.4* 8.4* 8.8*  PROT 7.4 6.3*  --  5.9*  --   --   BILITOT 0.7 0.9  --  1.6*  --   --   ALKPHOS 79 70  --  71  --   --  ALT 94* 79*  --  53  --   --   AST 50* 39  --  44*  --   --   GLUCOSE 93 94  < > 119* 146* 125*  < > = values in this interval not displayed.  Imaging/Diagnostic Tests: No recent imaging.  [name], MD 12/27/2016, 11:36 AM PGY-1, [place] Intern pager: [phone], text pages welcome

## 2016-12-27 NOTE — Clinical Social Work Note (Signed)
Clinical Social Work Assessment  Patient Details  Name: Blake Wilson MRN: [id number] Date of Birth: [birth year]  Date of referral:  12/27/16               Reason for consult:  Discharge Planning, Facility Placement                Permission sought to share information with:  Facility Art therapist granted to share information::  No  Name::        Agency::     Relationship::     Contact Information:     Housing/Transportation Living arrangements for the past 2 months:  [place] of Information:  Patient, Facility Patient Interpreter Needed:  None Criminal Activity/Legal Involvement Pertinent to Current Situation/Hospitalization:  No - Comment as needed Significant Relationships:  None Lives with:  Facility Resident, Self Do you feel safe going back to the place where you live?  Yes Need for family participation in patient care:  No (Coment)  Care giving concerns:  CSW received consult regarding discharge planning needs. Patient came to hospital from [facility]. Per their facility admissions liason, Suanne Marker, patient used to live in a senior apartment community and has no family or friends, except a cousin who has nothing to do with him. CSW attempted to speak with patient. When asked if he came from [facility], patient was confused and said he came from an apartment, but then he remembered and agreed that he would return to [place] for rehab. Patient finds it difficult to speak and is hard to understand. CSW to continue to follow for needs.    Social Worker assessment / plan:  CSW spoke with patient concerning returning to rehab at [facility] before returning home.  Employment status:  Retired Forensic scientist:  Managed Care PT Recommendations:  [place] / Referral to community resources:  [place]  Patient/Family's Response to care:  Patient recognizes need for rehab before returning home and  is agreeable to returning to [place].  Patient/Family's Understanding of and Emotional Response to Diagnosis, Current Treatment, and Prognosis:  Patient/family is realistic regarding therapy needs and expressed being hopeful for SNF placement. Patient expressed understanding of CSW role and discharge process. No questions/concerns about plan or treatment.    Emotional Assessment Appearance:  Appears stated age Attitude/Demeanor/Rapport:  Unable to Assess Affect (typically observed):  Accepting Orientation:  Oriented to Self Alcohol / Substance use:  Not Applicable Psych involvement (Current and /or in the community):  No (Comment)  Discharge Needs  Concerns to be addressed:  Care Coordination Readmission within the last 30 days:  Yes Current discharge risk:  Cognitively Impaired Barriers to Discharge:  Continued Medical Work up   [employer], [place] 12/27/2016, 11:27 AM

## 2016-12-28 LAB — GLUCOSE, CAPILLARY
GLUCOSE-CAPILLARY: 113 mg/dL — AB (ref 65–99)
GLUCOSE-CAPILLARY: 126 mg/dL — AB (ref 65–99)
Glucose-Capillary: 127 mg/dL — ABNORMAL HIGH (ref 65–99)
Glucose-Capillary: 114 mg/dL — ABNORMAL HIGH (ref 65–99)

## 2016-12-28 LAB — PHOSPHORUS: PHOSPHORUS: 3.9 mg/dL (ref 2.5–4.6)

## 2016-12-28 LAB — BASIC METABOLIC PANEL
ANION GAP: 8 (ref 5–15)
BUN: 5 mg/dL — ABNORMAL LOW (ref 6–20)
CHLORIDE: 105 mmol/L (ref 101–111)
CO2: 22 mmol/L (ref 22–32)
Calcium: 9 mg/dL (ref 8.9–10.3)
Creatinine, Ser: 0.84 mg/dL (ref 0.61–1.24)
GFR calc Af Amer: >60 mL/min (ref >60)
GLUCOSE: 137 mg/dL — AB (ref 65–99)
POTASSIUM: 4.1 mmol/L (ref 3.5–5.1)
Sodium: 135 mmol/L (ref 135–145)

## 2016-12-28 LAB — MAGNESIUM: Magnesium: 1.7 mg/dL (ref 1.7–2.4)

## 2016-12-28 MED ORDER — LISINOPRIL 10 MG PO TABS
10.0000 mg | ORAL_TABLET | Freq: Every day | ORAL | Status: DC
  Administered 2016-12-29: 10 mg
  Filled 2016-12-28: qty 1

## 2016-12-28 NOTE — Progress Notes (Signed)
Family Medicine Teaching Service Daily Progress Note Intern Pager: [phone]  Patient name: Blake Wilson Medical record number: [id number] Date of birth: 12-01-1950 Age: 66 y.o. Gender: male  Primary Care Provider: Luiz Blare, DO Consultants: IR, neurology (stroke) Code Status: FULL  Pt Overview and Major Events to Date:  3/2: Admitted for failure to thrive and AKI  3/3: MRI with extension of stroke, NPO given no swallow reflex, patient refused NG 3/6: PEG tube delayed, nutrition consulted for refeeding plans  3/7: PEG placed  3/8: feeding resumed   Assessment and Plan: Blake Wilson a 66 y.o.malepresenting from [place] for no PO intake since discharge found to have an AKI. PMH is significant for CAD, HFrEF, dilated cardiomyopathy, T2DM with neuropathy, alcohol abuse, and recent hospitalization for CVA/UTI/rhabdomyolysis.   Failure to Thrive, concern for refeeding syndrome: PEG placed on 3/7, no evidence of refeeding syndrome at present. - nutrition consult placed, appreciate recs: 75ml prostat BID, 143ml free water flushes QID, Jevity 76ml increasing 21ml q12H to goal of 31ml/hr.  Feeds set up to auto-increase.   - BMP this am unremarkable.  Phos wnl 3.9, Magnesium wnl 1.7.  -Continue checking refeeding labs (mag, phos) daily   CVA, Pontine infarction, Acute/Subacute: stable - Neurology signed off, medically optimized -Continue aspirin, plavix  - SW consult for anticipated return to SNF at d/c   Elevated LFTs: AST 50 > 44 and ALT 94 > 53. CK normal.  -Continue Lipitor 80 mg per tube  HFrEF:  TEE at last admission with LVEF 40-45%. No concern for fluid overload at present.  Holding all PO meds, no beta blocker at present due to bradycardia. - d/c IVFs   HTN.  BP this AM 160/48. -Increase Lisinopril 5 mg to 10 mg daily per tube.  -Continue to monitor   T2DM: Lantus dose decreased to 5u at recent hospitalization. CBGs stable 133, 88, 119, 127   - hold Lantus due to good CBG control, even with restarting feeds - CBG QD  - d/c SSI - hold metformin   FEN/GI:  tube feeds starting at 13ml/hr and increasing by 44mL to goal of 2ml/hr, NPO Prophylaxis: Lovenox   Disposition: SNF pending PEG tube feeds at goal and resolution of feeding without electrolyte changes.   Subjective:  Patient reports no complaints at current time.  Denies abdominal pain, shortness of breath.     Objective: Temp:  [98.1 F (36.7 C)-98.5 F (36.9 C)] 98.1 F (36.7 C) (03/10 0653) Pulse Rate:  [50-72] 50 (03/10 0653) Resp:  [16-20] 20 (03/10 0653) BP: (115-160)/(48-63) 160/48 (03/10 0653) SpO2:  [98 %-99 %] 98 % (03/10 0653)   Physical Exam: General: lying in bed in NAD  Cardiovascular: RRR. No MRG  Respiratory: CTAB. Normal WOB.  Abdomen: soft, NTND. PEG tube without erythema or bleeding. +bs Extremities: No LE edema Psych: flat affect   Laboratory:  Recent Labs Lab 12/24/16 0442 12/25/16 2028 12/26/16 0801  WBC 3.9* 9.0 5.6  HGB 13.7 14.5 13.2  HCT 39.9 42.4 39.1  PLT 221 219 213    Recent Labs Lab 12/25/16 1400 12/26/16 0801 12/27/16 0750 12/28/16 0545  NA 138 137 139 135  K 5.4* 3.4* 3.6 4.1  CL 111 109 108 105  CO2 19* 23 23 22   BUN <5* <5* <5* 5*  CREATININE 0.81 0.85 0.81 0.84  CALCIUM 8.4* 8.4* 8.8* 9.0  PROT 5.9*  --   --   --   BILITOT 1.6*  --   --   --  ALKPHOS 71  --   --   --   ALT 53  --   --   --   AST 44*  --   --   --   GLUCOSE 119* 146* 125* 137*   Imaging/Diagnostic Tests: No recent imaging.  Lovenia Kim, MD 12/28/2016, 11:33 AM PGY-1, [place] Intern pager: [phone], text pages welcome

## 2016-12-29 LAB — GLUCOSE, CAPILLARY
GLUCOSE-CAPILLARY: 120 mg/dL — AB (ref 65–99)
GLUCOSE-CAPILLARY: 143 mg/dL — AB (ref 65–99)
Glucose-Capillary: 141 mg/dL — ABNORMAL HIGH (ref 65–99)
Glucose-Capillary: 113 mg/dL — ABNORMAL HIGH (ref 65–99)

## 2016-12-29 LAB — MAGNESIUM: MAGNESIUM: 1.6 mg/dL — AB (ref 1.7–2.4)

## 2016-12-29 LAB — PHOSPHORUS: PHOSPHORUS: 4.2 mg/dL (ref 2.5–4.6)

## 2016-12-29 MED ORDER — MAGNESIUM SULFATE 2 GM/50ML IV SOLN
2.0000 g | Freq: Once | INTRAVENOUS | Status: AC
  Administered 2016-12-29: 2 g via INTRAVENOUS
  Filled 2016-12-29: qty 50

## 2016-12-29 MED ORDER — LISINOPRIL 20 MG PO TABS
20.0000 mg | ORAL_TABLET | Freq: Every day | ORAL | Status: DC
  Administered 2016-12-30: 20 mg
  Filled 2016-12-29: qty 1

## 2016-12-29 NOTE — Progress Notes (Signed)
Family Medicine Teaching Service Daily Progress Note Intern Pager: [phone]  Patient name: Blake Wilson Medical record number: [id number] Date of birth: 09-26-1951 Age: 66 y.o. Gender: male  Primary Care Provider: Luiz Blare, DO Consultants: IR, neurology (stroke) Code Status: FULL  Pt Overview and Major Events to Date:  3/2: Admitted for failure to thrive and AKI  3/3: MRI with extension of stroke, NPO given no swallow reflex, patient refused NG 3/6: PEG tube delayed, nutrition consulted for refeeding plans  3/7: PEG placed  3/8: feeding resumed   Assessment and Plan: Blake Tugwell Farrishis a 66 y.o.malepresenting from [place] for no PO intake since discharge found to have an AKI. PMH is significant for CAD, HFrEF, dilated cardiomyopathy, T2DM with neuropathy, alcohol abuse, and recent hospitalization for CVA/UTI/rhabdomyolysis.    Failure to Thrive, concern for refeeding syndrome:  PEG placed on 3/7.  Nutrition on board, appreciate recs: 57ml prostat BID, 180ml free water flushes QID, Jevity 66ml increasing 77ml q12H to goal of 55ml/hr.  Feeds set up to auto-increase.   -Phos 4.2 wnl, Magnesium low 1.6. Will give 2g IV Mag sulfate this AM.   -Continue checking refeeding labs (mag, phos) daily   CVA, Pontine infarction, Acute/Subacute: stable - Neurology signed off, medically optimized -Continue aspirin, plavix  - SW consult for anticipated return to SNF at d/c   Elevated LFTs: AST 50 > 44 and ALT 94 > 53. CK normal.  -Continue Lipitor 80 mg per tube   HFrEF:  TEE at last admission with LVEF 40-45%. No concern for fluid overload at present.  Holding all PO meds, no beta blocker at present due to bradycardia  - d/c IVFs   HTN.  BP this AM 159/55.  -Increased Lisinopril 5 mg to 10 mg daily on 3/10 per tube.  SBPs have been in 150s.  Can increase to 20 mg this Am continue monitoring pressures.   T2DM: Lantus dose decreased to 5u at recent  hospitalization. CBGs stable 127, 114, 113, 126.  - hold Lantus due to good CBG control, even with restarting feeds  - CBG QD  - d/c SSI - hold metformin   FEN/GI:  tube feeds increasing Q12h by 19mL to goal of 28ml/hr, NPO Prophylaxis: Lovenox   Disposition: SNF pending PEG tube feeds at goal and resolution of feeding without electrolyte changes.   Subjective:  Patient laying in bed watching tv.  He is quiet but communicates that he is not in any pain and has no concerns at this time.  Feels well.   Objective: Temp:  [97.7 F (36.5 C)-98.2 F (36.8 C)] 98.1 F (36.7 C) (03/11 0640) Pulse Rate:  [58-64] 64 (03/11 0640) Resp:  [18] 18 (03/11 0640) BP: (130-159)/(55-59) 159/55 (03/11 0640) SpO2:  [97 %-100 %] 98 % (03/11 0640)   Physical Exam: General: 66 yo M lying in hospital bed in NAD  Cardiovascular: RRR. No MRG noted  Respiratory: CTAB. Normal WOB.  Abdomen: soft, NTND. PEG tube without erythema or bleeding, normal bowel sounds  Extremities: No LE edema Psych: flat affect   Laboratory:  Recent Labs Lab 12/24/16 0442 12/25/16 2028 12/26/16 0801  WBC 3.9* 9.0 5.6  HGB 13.7 14.5 13.2  HCT 39.9 42.4 39.1  PLT 221 219 213    Recent Labs Lab 12/25/16 1400 12/26/16 0801 12/27/16 0750 12/28/16 0545  NA 138 137 139 135  K 5.4* 3.4* 3.6 4.1  CL 111 109 108 105  CO2 19* 23 23 22   BUN <  5* <5* <5* 5*  CREATININE 0.81 0.85 0.81 0.84  CALCIUM 8.4* 8.4* 8.8* 9.0  PROT 5.9*  --   --   --   BILITOT 1.6*  --   --   --   ALKPHOS 71  --   --   --   ALT 53  --   --   --   AST 44*  --   --   --   GLUCOSE 119* 146* 125* 137*   Imaging/Diagnostic Tests: No results found.  Lovenia Kim, MD 12/29/2016, 10:57 AM PGY-1, [place] Intern pager: [phone], text pages welcome

## 2016-12-30 DIAGNOSIS — E875 Hyperkalemia: Secondary | ICD-10-CM

## 2016-12-30 LAB — BASIC METABOLIC PANEL
ANION GAP: 9 (ref 5–15)
Anion gap: 9 (ref 5–15)
Anion gap: 8 (ref 5–15)
Anion gap: 8 (ref 5–15)
BUN: 15 mg/dL (ref 6–20)
BUN: 15 mg/dL (ref 6–20)
BUN: 20 mg/dL (ref 6–20)
BUN: 19 mg/dL (ref 6–20)
CALCIUM: 9.4 mg/dL (ref 8.9–10.3)
CALCIUM: 9.6 mg/dL (ref 8.9–10.3)
CALCIUM: 9.2 mg/dL (ref 8.9–10.3)
CHLORIDE: 104 mmol/L (ref 101–111)
CHLORIDE: 101 mmol/L (ref 101–111)
CO2: 21 mmol/L — ABNORMAL LOW (ref 22–32)
CO2: 21 mmol/L — AB (ref 22–32)
CO2: 23 mmol/L (ref 22–32)
CO2: 26 mmol/L (ref 22–32)
CREATININE: 1.08 mg/dL (ref 0.61–1.24)
CREATININE: 1 mg/dL (ref 0.61–1.24)
Calcium: 9.3 mg/dL (ref 8.9–10.3)
Chloride: 102 mmol/L (ref 101–111)
Chloride: 104 mmol/L (ref 101–111)
Creatinine, Ser: 0.92 mg/dL (ref 0.61–1.24)
Creatinine, Ser: 0.97 mg/dL (ref 0.61–1.24)
GFR calc Af Amer: >60 mL/min (ref >60)
GFR calc Af Amer: >60 mL/min (ref >60)
GFR calc Af Amer: >60 mL/min (ref >60)
GFR calc Af Amer: >60 mL/min (ref >60)
GFR calc non Af Amer: >60 mL/min (ref >60)
GFR calc non Af Amer: >60 mL/min (ref >60)
GLUCOSE: 133 mg/dL — AB (ref 65–99)
GLUCOSE: 112 mg/dL — AB (ref 65–99)
GLUCOSE: 177 mg/dL — AB (ref 65–99)
Glucose, Bld: 151 mg/dL — ABNORMAL HIGH (ref 65–99)
POTASSIUM: 5.5 mmol/L — AB (ref 3.5–5.1)
Potassium: 6.1 mmol/L — ABNORMAL HIGH (ref 3.5–5.1)
Potassium: 5.7 mmol/L — ABNORMAL HIGH (ref 3.5–5.1)
Potassium: 5 mmol/L (ref 3.5–5.1)
SODIUM: 132 mmol/L — AB (ref 135–145)
Sodium: 134 mmol/L — ABNORMAL LOW (ref 135–145)
Sodium: 135 mmol/L (ref 135–145)
Sodium: 135 mmol/L (ref 135–145)

## 2016-12-30 LAB — GLUCOSE, CAPILLARY
GLUCOSE-CAPILLARY: 141 mg/dL — AB (ref 65–99)
Glucose-Capillary: 109 mg/dL — ABNORMAL HIGH (ref 65–99)
Glucose-Capillary: 116 mg/dL — ABNORMAL HIGH (ref 65–99)

## 2016-12-30 LAB — MAGNESIUM: MAGNESIUM: 1.7 mg/dL (ref 1.7–2.4)

## 2016-12-30 LAB — PHOSPHORUS: PHOSPHORUS: 4.6 mg/dL (ref 2.5–4.6)

## 2016-12-30 MED ORDER — POTASSIUM CHLORIDE 20 MEQ/15ML (10%) PO SOLN: 40.0000 meq | Freq: Two times a day (BID) | ORAL | Status: DC

## 2016-12-30 MED ORDER — PRO-STAT SUGAR FREE PO LIQD
30.0000 mL | Freq: Two times a day (BID) | ORAL | Status: DC
  Administered 2016-12-30: 30 mL

## 2016-12-30 MED ORDER — ASPIRIN 325 MG PO TABS
325.0000 mg | ORAL_TABLET | Freq: Every day | ORAL | Status: DC
  Administered 2016-12-30 – 2016-12-31 (×2): 325 mg
  Filled 2016-12-30: qty 1

## 2016-12-30 MED ORDER — ASPIRIN 300 MG RE SUPP: 300.0000 mg | Freq: Every day | RECTAL | Status: DC

## 2016-12-30 MED ORDER — PRO-STAT SUGAR FREE PO LIQD
30.0000 mL | Freq: Every day | ORAL | Status: DC
  Administered 2016-12-31: 30 mL
  Filled 2016-12-30: qty 30

## 2016-12-30 MED ORDER — JEVITY 1.2 CAL PO LIQD
300.0000 mL | Freq: Every day | ORAL | Status: DC
  Administered 2016-12-30 – 2016-12-31 (×6): 300 mL
  Filled 2016-12-30 (×11): qty 474

## 2016-12-30 MED ORDER — SODIUM POLYSTYRENE SULFONATE 15 GM/60ML PO SUSP
30.0000 g | Freq: Once | ORAL | Status: AC
  Administered 2016-12-30: 30 g
  Filled 2016-12-30: qty 120

## 2016-12-30 NOTE — Progress Notes (Signed)
Nutrition Follow-up  INTERVENTION:  Adjust TF to bolus 300 ml Jevity 1.2 five times per day  30 ml Prostat daily Provides: 1900 kcal, 98 grams protein, and 1215 ml free water 140 ml QID Total free water: 1775 ml    NUTRITION DIAGNOSIS:   Inadequate oral intake related to inability to eat, dysphagia as evidenced by NPO status. Ongoing.   GOAL:   Patient will meet greater than or equal to 90% of their needs Met.   MONITOR:   Labs, Weight trends, TF tolerance, Skin, I & O's  ASSESSMENT:   Blake Wilson is a 66 y.o. male presenting from [place] facility for no PO intake since discharge found to have an AKI. PMH is significant for CAD, HFrEF, dilated cardiomyopathy, T2DM with neuropathy, alcohol abuse, and recent hospitalization for CVA/UTI/rhabdomyolysis.    Received page from RN, request bolus feedings before transfer back to SNF.  K+6.1, PO4 and magnesium are WNL Will need to monitor fluid status to determine if further fluid is needed.   Diet Order:    NPO  Skin:  Reviewed, no issues  Last BM:  3/6  Height:   Ht Readings from Last 1 Encounters:  12/20/16 6' (1.829 m)    Weight:   Wt Readings from Last 1 Encounters:  12/20/16 205 lb 0.4 oz (93 kg)    Ideal Body Weight:  80.9 kg  BMI:  Body mass index is 27.81 kg/m.  Estimated Nutritional Needs:   Kcal:  1800-2000  Protein:  90-105 grams  Fluid:  1.8-2.0 L  EDUCATION NEEDS:   Education needs addressed  Maylon Peppers RD, [place], [place] Pager [phone] After Hours Pager

## 2016-12-30 NOTE — Care Management Important Message (Signed)
Important Message  Patient Details  Name: Blake Wilson MRN: [id number] Date of Birth: 26-Mar-1951   Medicare Important Message Given:  Yes    Nathen May 12/30/2016, 1:54 PM

## 2016-12-30 NOTE — Progress Notes (Signed)
Family Medicine Teaching Service Daily Progress Note Intern Pager: [phone]  Patient name: Blake Wilson Medical record number: [id number] Date of birth: 03/11/1951 Age: 66 y.o. Gender: male  Primary Care Provider: Luiz Blare, DO Consultants: IR, neurology (stroke) Code Status: FULL  Pt Overview and Major Events to Date:  3/2: Admitted for failure to thrive and AKI  3/3: MRI with extension of stroke, NPO given no swallow reflex, patient refused NG 3/6: PEG tube delayed, nutrition consulted for refeeding plans  3/7: PEG placed  3/8: feeding resumed   Assessment and Plan: Blake Kishi Farrishis a 66 y.o.malepresenting from [place] for no PO intake since discharge found to have an AKI. PMH is significant for CAD, HFrEF, dilated cardiomyopathy, T2DM with neuropathy, alcohol abuse, and recent hospitalization for CVA/UTI/rhabdomyolysis.    Failure to Thrive, concern for refeeding syndrome:  PEG placed on 3/7.  Nutrition on board, appreciate recs: 56ml prostat BID, 113ml free water flushes QID, Jevity 71ml increasing 52ml q12H to goal of 86ml/hr.  Feeds at goal. Patient denies nausea. Paged nutrition again for bolus feed recommendations at SNF.  -Phos wnl, Magnesium WNL. K 5.5.  - discontinued K solution, given 30g Kayexalate once, recheck BMP 1100  CVA, Pontine infarction, Acute/Subacute: stable - Neurology signed off, medically optimized -Continue aspirin, plavix  - SW consult for anticipated return to SNF at d/c   Elevated LFTs: AST 50 > 44 and ALT 94 > 53. CK normal.  -Continue Lipitor 80 mg per tube   HFrEF:  TEE at last admission with LVEF 40-45%. No concern for fluid overload at present.  Holding all PO meds, no beta blocker at present due to bradycardia  - d/c IVFs   HTN.  BP this AM 139/49.  -lisinopril 10mg  QD, can increase as BPs require  T2DM: Lantus dose decreased to 5u at recent hospitalization.  - hold Lantus due to good CBG control, even with  restarting feeds  - CBG QD  - hold metformin   FEN/GI:  tube feeds increasing Q12h by 35mL to goal of 74ml/hr, NPO Prophylaxis: Lovenox   Disposition: SNF pending PEG tube feeds at goal and resolution of feeding without electrolyte changes.   Subjective:  Patient denies pain or nausea this morning. Amenable to returning to SNF today if possible.   Objective: Temp:  [98.2 F (36.8 C)-98.7 F (37.1 C)] 98.3 F (36.8 C) (03/12 0537) Pulse Rate:  [62-72] 62 (03/12 0537) Resp:  [18-20] 20 (03/11 2230) BP: (132-154)/(43-55) 139/49 (03/12 0537) SpO2:  [99 %] 99 % (03/12 0537)   Physical Exam: General: 66 yo M lying in hospital bed in NAD  Cardiovascular: RRR. No MRG noted  Respiratory: CTAB. Normal WOB.  Abdomen: soft, NTND. PEG tube without erythema or bleeding, normal bowel sounds  Extremities: No LE edema Psych: flat affect   Laboratory:  Recent Labs Lab 12/24/16 0442 12/25/16 2028 12/26/16 0801  WBC 3.9* 9.0 5.6  HGB 13.7 14.5 13.2  HCT 39.9 42.4 39.1  PLT 221 219 213    Recent Labs Lab 12/25/16 1400 12/26/16 0801 12/27/16 0750 12/28/16 0545  NA 138 137 139 135  K 5.4* 3.4* 3.6 4.1  CL 111 109 108 105  CO2 19* 23 23 22   BUN <5* <5* <5* 5*  CREATININE 0.81 0.85 0.81 0.84  CALCIUM 8.4* 8.4* 8.8* 9.0  PROT 5.9*  --   --   --   BILITOT 1.6*  --   --   --   ALKPHOS 71  --   --   --  ALT 53  --   --   --   AST 44*  --   --   --   GLUCOSE 119* 146* 125* 137*   Imaging/Diagnostic Tests: No results found.  [name], MD 12/30/2016, 7:03 AM PGY-1, [place] Intern pager: [phone], text pages welcome

## 2016-12-30 NOTE — Progress Notes (Signed)
Physical Therapy Treatment Patient Details Name: Blake Wilson MRN: [id number] DOB: [birth year] Today's Date: 12/30/2016    History of Present Illness 66 y.o. male presenting from [place] for no PO intake since discharge found to have an AKI. PMH is significant for CAD, HFrEF, dilated cardiomyopathy, T2DM with neuropathy, alcohol abuse, and recent hospitalization for CVA/UTI/rhabdomyolysis.      PT Comments    Pt presents with decreased strength and fatigue this am.  Pt is at an increased risk for a fall without external assistance.  Plan remains appropriate for ST SNF placement to improve strength and functional mobility at this time.  Will plan for continuation of gait and transfer activities with emphasis on safety next session.      Follow Up Recommendations  SNF     Equipment Recommendations  None recommended by PT    Recommendations for Other Services       Precautions / Restrictions Precautions Precautions: Fall Restrictions Weight Bearing Restrictions: No    Mobility  Bed Mobility Overal bed mobility: Needs Assistance Bed Mobility: Supine to Sit;Sit to Supine     Supine to sit: Min assist     General bed mobility comments: +rail, cues for hand placement, assist to elevate trunk into sitting edge of bed.  Pt required increased time to advance to edge of bed.  poor trunk control sitting edge of bed.    Transfers Overall transfer level: Needs assistance Equipment used: Rolling walker (2 wheeled) Transfers: Sit to/from Stand Sit to Stand: Mod assist         General transfer comment: Heavy posterior lean when ascending to standing.  Cues to push from seated surface.  Pt required cues for eccentric loading, remains to present with poor eccentric loading despite cueing.   In standing patient flexed at trunk, hips and head.  required constant cues to correct posture and improve standing.    Ambulation/Gait Ambulation/Gait assistance: Max  assist Ambulation Distance (Feet): 15 Feet (+ 18 ft) Assistive device: Rolling walker (2 wheeled) Gait Pattern/deviations: Step-to pattern;Shuffle;Decreased stride length;Narrow base of support Gait velocity: decreased Gait velocity interpretation: Below normal speed for age/gender General Gait Details: Pt presents flexed with forward placement of RW.  Required max VCs for upright posture and cues to step closer to RW.  Pt attempted to sit/squat several times when trying to back out of the bathroom.  Pt leaning against door frame and required chair to be brought up to patient for safety as he was fatiguing quickly.  Pt then able to perform gait trial back to chair after seated rest break.  Pt unable to perform increased gait distance due to fatigue and poor safety.     Stairs            Wheelchair Mobility    Modified Rankin (Stroke Patients Only)       Balance Overall balance assessment: Needs assistance Sitting-balance support: No upper extremity supported;Feet supported Sitting balance-Leahy Scale: Fair       Standing balance-Leahy Scale: Poor                      Cognition Arousal/Alertness: Awake/alert Behavior During Therapy: WFL for tasks assessed/performed Overall Cognitive Status: Difficult to assess Area of Impairment: Orientation;Attention;Following commands;Safety/judgement;Problem solving;Awareness       Following Commands: Follows one step commands with increased time Safety/Judgement: Decreased awareness of deficits;Decreased awareness of safety   Problem Solving: Slow processing;Decreased initiation;Requires verbal cues General Comments: Pt presenting with decreased strength  and need for more time to process and problem solve today.      Exercises      General Comments        Pertinent Vitals/Pain Pain Assessment: Faces Pain Score: 0-No pain    Home Living                      Prior Function            PT Goals (current  goals can now be found in the care plan section) Acute Rehab PT Goals Patient Stated Goal: not stated Additional Goals Additional Goal #1: Patient to demonstrate intellectual awareness of deficits by naming 2 issues with min questioning cues. Progress towards PT goals: Progressing toward goals    Frequency    Min 2X/week      PT Plan Current plan remains appropriate    Co-evaluation             End of Session Equipment Utilized During Treatment: Gait belt Activity Tolerance: Patient tolerated treatment well Patient left: in bed;with call bell/phone within reach;with bed alarm set Nurse Communication: Mobility status       Time: 1610-9604 PT Time Calculation (min) (ACUTE ONLY): 31 min  Charges:  $Gait Training: 8-22 mins $Therapeutic Activity: 8-22 mins                    G Codes:       Cristela Blue 07-Jan-2017, 10:37 AM [place]ook[place], PTA pager [phone]

## 2016-12-31 DIAGNOSIS — I5022 Chronic systolic (congestive) heart failure: Secondary | ICD-10-CM

## 2016-12-31 DIAGNOSIS — B958 Unspecified staphylococcus as the cause of diseases classified elsewhere: Secondary | ICD-10-CM | POA: Diagnosis not present

## 2016-12-31 DIAGNOSIS — R488 Other symbolic dysfunctions: Secondary | ICD-10-CM | POA: Diagnosis not present

## 2016-12-31 DIAGNOSIS — E1140 Type 2 diabetes mellitus with diabetic neuropathy, unspecified: Secondary | ICD-10-CM | POA: Diagnosis not present

## 2016-12-31 DIAGNOSIS — I69351 Hemiplegia and hemiparesis following cerebral infarction affecting right dominant side: Secondary | ICD-10-CM | POA: Diagnosis not present

## 2016-12-31 DIAGNOSIS — R1312 Dysphagia, oropharyngeal phase: Secondary | ICD-10-CM | POA: Diagnosis not present

## 2016-12-31 DIAGNOSIS — E785 Hyperlipidemia, unspecified: Secondary | ICD-10-CM | POA: Diagnosis not present

## 2016-12-31 DIAGNOSIS — E875 Hyperkalemia: Secondary | ICD-10-CM | POA: Diagnosis not present

## 2016-12-31 DIAGNOSIS — I69320 Aphasia following cerebral infarction: Secondary | ICD-10-CM | POA: Diagnosis not present

## 2016-12-31 DIAGNOSIS — Z431 Encounter for attention to gastrostomy: Secondary | ICD-10-CM | POA: Diagnosis not present

## 2016-12-31 DIAGNOSIS — I69322 Dysarthria following cerebral infarction: Secondary | ICD-10-CM | POA: Diagnosis not present

## 2016-12-31 DIAGNOSIS — M6281 Muscle weakness (generalized): Secondary | ICD-10-CM | POA: Diagnosis not present

## 2016-12-31 DIAGNOSIS — Z794 Long term (current) use of insulin: Secondary | ICD-10-CM | POA: Diagnosis not present

## 2016-12-31 DIAGNOSIS — Z7982 Long term (current) use of aspirin: Secondary | ICD-10-CM | POA: Diagnosis not present

## 2016-12-31 DIAGNOSIS — H409 Unspecified glaucoma: Secondary | ICD-10-CM | POA: Diagnosis not present

## 2016-12-31 DIAGNOSIS — N179 Acute kidney failure, unspecified: Secondary | ICD-10-CM | POA: Diagnosis not present

## 2016-12-31 DIAGNOSIS — I2510 Atherosclerotic heart disease of native coronary artery without angina pectoris: Secondary | ICD-10-CM | POA: Diagnosis not present

## 2016-12-31 DIAGNOSIS — I420 Dilated cardiomyopathy: Secondary | ICD-10-CM | POA: Diagnosis not present

## 2016-12-31 DIAGNOSIS — R4182 Altered mental status, unspecified: Secondary | ICD-10-CM | POA: Diagnosis not present

## 2016-12-31 DIAGNOSIS — I69391 Dysphagia following cerebral infarction: Secondary | ICD-10-CM | POA: Diagnosis not present

## 2016-12-31 DIAGNOSIS — Z7902 Long term (current) use of antithrombotics/antiplatelets: Secondary | ICD-10-CM | POA: Diagnosis not present

## 2016-12-31 DIAGNOSIS — I63012 Cerebral infarction due to thrombosis of left vertebral artery: Secondary | ICD-10-CM | POA: Diagnosis not present

## 2016-12-31 LAB — BASIC METABOLIC PANEL
ANION GAP: 8 (ref 5–15)
ANION GAP: 11 (ref 5–15)
BUN: 21 mg/dL — ABNORMAL HIGH (ref 6–20)
BUN: 27 mg/dL — ABNORMAL HIGH (ref 6–20)
CHLORIDE: 98 mmol/L — AB (ref 101–111)
CO2: 25 mmol/L (ref 22–32)
CO2: 24 mmol/L (ref 22–32)
Calcium: 9.4 mg/dL (ref 8.9–10.3)
Calcium: 9.1 mg/dL (ref 8.9–10.3)
Chloride: 101 mmol/L (ref 101–111)
Creatinine, Ser: 1.11 mg/dL (ref 0.61–1.24)
Creatinine, Ser: 1.19 mg/dL (ref 0.61–1.24)
GFR calc non Af Amer: >60 mL/min (ref >60)
Glucose, Bld: 130 mg/dL — ABNORMAL HIGH (ref 65–99)
Glucose, Bld: 136 mg/dL — ABNORMAL HIGH (ref 65–99)
Potassium: 5.2 mmol/L — ABNORMAL HIGH (ref 3.5–5.1)
Potassium: 4.6 mmol/L (ref 3.5–5.1)
Sodium: 134 mmol/L — ABNORMAL LOW (ref 135–145)
Sodium: 133 mmol/L — ABNORMAL LOW (ref 135–145)

## 2016-12-31 LAB — CBC
HEMATOCRIT: 45.9 % (ref 39.0–52.0)
Hemoglobin: 15.2 g/dL (ref 13.0–17.0)
MCH: 30.6 pg (ref 26.0–34.0)
MCHC: 33.1 g/dL (ref 30.0–36.0)
MCV: 92.5 fL (ref 78.0–100.0)
PLATELETS: 261 10*3/uL (ref 150–400)
RBC: 4.96 MIL/uL (ref 4.22–5.81)
RDW: 13.8 % (ref 11.5–15.5)
WBC: 7.2 10*3/uL (ref 4.0–10.5)

## 2016-12-31 LAB — MAGNESIUM: MAGNESIUM: 1.8 mg/dL (ref 1.7–2.4)

## 2016-12-31 LAB — GLUCOSE, CAPILLARY
GLUCOSE-CAPILLARY: 253 mg/dL — AB (ref 65–99)
GLUCOSE-CAPILLARY: 159 mg/dL — AB (ref 65–99)
Glucose-Capillary: 177 mg/dL — ABNORMAL HIGH (ref 65–99)

## 2016-12-31 LAB — PHOSPHORUS
PHOSPHORUS: 4.7 mg/dL — AB (ref 2.5–4.6)
Phosphorus: 5.1 mg/dL — ABNORMAL HIGH (ref 2.5–4.6)

## 2016-12-31 MED ORDER — SODIUM POLYSTYRENE SULFONATE 15 GM/60ML PO SUSP
15.0000 g | Freq: Once | ORAL | Status: AC
Start: 2016-12-31 — End: 2016-12-31
  Administered 2016-12-31: 15 g
  Filled 2016-12-31: qty 60

## 2016-12-31 MED ORDER — CLOPIDOGREL BISULFATE 75 MG PO TABS: 75.0000 mg | ORAL_TABLET | Freq: Every day | ORAL | 0 refills | Status: DC

## 2016-12-31 MED ORDER — FOLIC ACID 1 MG PO TABS: 1.0000 mg | ORAL_TABLET | Freq: Every day | ORAL | 0 refills | Status: DC

## 2016-12-31 MED ORDER — GABAPENTIN 300 MG PO CAPS: ORAL_CAPSULE | ORAL | 3 refills | Status: DC

## 2016-12-31 MED ORDER — CARVEDILOL 3.125 MG PO TABS: 3.1250 mg | ORAL_TABLET | Freq: Two times a day (BID) | ORAL | 0 refills | Status: DC

## 2016-12-31 MED ORDER — ATORVASTATIN CALCIUM 80 MG PO TABS: 80.0000 mg | ORAL_TABLET | Freq: Every day | ORAL | 12 refills | Status: DC

## 2016-12-31 MED ORDER — FREE WATER
250.0000 mL | Freq: Four times a day (QID) | Status: DC
  Administered 2016-12-31: 250 mL

## 2016-12-31 MED ORDER — FREE WATER: 250.0000 mL | Freq: Four times a day (QID) | 10 refills | Status: DC

## 2016-12-31 MED ORDER — ASPIRIN 325 MG PO TABS: 325.0000 mg | ORAL_TABLET | Freq: Every day | ORAL | 0 refills | Status: DC

## 2016-12-31 MED ORDER — METFORMIN HCL 1000 MG PO TABS: 1000.0000 mg | ORAL_TABLET | Freq: Two times a day (BID) | ORAL | 6 refills | Status: DC

## 2016-12-31 MED ORDER — SACUBITRIL-VALSARTAN 49-51 MG PO TABS: ORAL_TABLET | ORAL | 0 refills | Status: DC

## 2016-12-31 MED ORDER — SODIUM CHLORIDE 0.9 % IV BOLUS (SEPSIS)
1000.0000 mL | Freq: Once | INTRAVENOUS | Status: AC
  Administered 2016-12-31: 1000 mL via INTRAVENOUS

## 2016-12-31 MED ORDER — NEPRO/CARBSTEADY PO LIQD: 237.0000 mL | Freq: Four times a day (QID) | ORAL | Status: DC

## 2016-12-31 MED ORDER — NEPRO/CARBSTEADY PO LIQD: 237.0000 mL | Freq: Four times a day (QID) | ORAL | 10 refills | Status: DC

## 2016-12-31 MED ORDER — PRO-STAT SUGAR FREE PO LIQD: 30.0000 mL | Freq: Every day | ORAL | 0 refills | Status: DC

## 2016-12-31 NOTE — Progress Notes (Signed)
Family Medicine Teaching Service Daily Progress Note Intern Pager: [phone]  Patient name: Blake Wilson Medical record number: [id number] Date of birth: [birth year] Age: 66 y.o. Gender: male  Primary Care Provider: Luiz Blare, DO Consultants: IR, neurology (stroke) Code Status: FULL  Pt Overview and Major Events to Date:  3/2: Admitted for failure to thrive and AKI  3/3: MRI with extension of stroke, NPO given no swallow reflex, patient refused NG 3/6: PEG tube delayed, nutrition consulted for refeeding plans  3/7: PEG placed  3/8: feeding resumed   Assessment and Plan: Osias Resnick Farrishis a 66 y.o.malepresenting from [facility] for no PO intake since discharge found to have an AKI. PMH is significant for CAD, HFrEF, dilated cardiomyopathy, T2DM with neuropathy, alcohol abuse, and recent hospitalization for CVA/UTI/rhabdomyolysis.    Failure to Thrive s/p PEG, concern for refeeding syndrome: PEG placed on 3/7. Nutrition on board, appreciate recs: 63ml prostat BID, 15ml free water flushes QID, Jevity 332ml five times daily.  Feeds at goal. Phos mildly elevated at 4.7, Magnesium WNL. K 5.7 > 5.0 > 5.2. - discontinued K solution, given 30g Kayexalate once 3/13. Give another 15g Kayexalate today. - Recheck BMET and phos 1400.   CVA, [place] infarction, Acute/Subacute: stable - Neurology signed off, medically optimized - Continue aspirin, plavix  - SW consult for anticipated return to SNF at d/c   HFrEF:  TEE at last admission with LVEF 40-45%. No concern for fluid overload at present. On Coreg 3.125mg  bid, Entresto 97-103mg  bid, and Lasix 40mg  daily at home. - Holding all PO meds, including beta blocker with HRs in the 60s - Would like to restart Entresto, but Pt was started on Lisinopril this admission. Hold Lisinopril today and need to wait 36 hours before restarting Entresto. Plan to restart [facility] tomorrow, either here or at [facility].  HTN. BP this AM 110/47.   - Stop Lisinopril, as Pt needs to be switched back to his home [facility] tomorrow. - Will consider restarting Coreg today, pending HRs  T2DM: Have been holding Lantus due to good CBG control. CBG this AM 253, now that he is getting full feeds. - Increase CBG checks from qd to tid w/ meals - Add Lantus 5 units tonight or restart Lantus on discharge, pending dispo plan. - hold metformin   FEN/GI: Jevity 316ml five times daily, Prostat 22ml daily Prophylaxis: Lovenox   Disposition: Discharge to SNF possibly today, pending labs at 1400.  Subjective:  Pt states he is doing fine this morning. He denies any abdominal pain. He states he is fine to be discharged.  Objective: Temp:  [98 F (36.7 C)-98.5 F (36.9 C)] 98.5 F (36.9 C) (03/13 0619) Pulse Rate:  [63-84] 63 (03/13 0619) Resp:  [16-18] 18 (03/13 0619) BP: (122-143)/(40-71) 143/40 (03/13 0619) SpO2:  [87 %-100 %] 100 % (03/13 4401)   Physical Exam: General: Laying in bed, in NAD, pleasant Cardiovascular: RRR. No MRG noted  Respiratory: CTAB. Normal WOB.  Abdomen: soft, NTND. PEG tube without erythema or bleeding, +BS Extremities: No LE edema, warm and well-perfused Psych: flat affect but smiles  Laboratory:  Recent Labs Lab 12/25/16 2028 12/26/16 0801 12/31/16 0505  WBC 9.0 5.6 7.2  HGB 14.5 13.2 15.2  HCT 42.4 39.1 45.9  PLT 219 213 261    Recent Labs Lab 12/25/16 1400  12/30/16 1514 12/30/16 1826 12/31/16 0505  NA 138  < > 135 135 134*  K 5.4*  < > 5.7* 5.0 5.2*  CL 111  < >  104 101 101  CO2 19*  < > 23 26 25   BUN <5*  < > 20 19 21*  CREATININE 0.81  < > 1.08 1.00 1.11  CALCIUM 8.4*  < > 9.2 9.3 9.4  PROT 5.9*  --   --   --   --   BILITOT 1.6*  --   --   --   --   ALKPHOS 71  --   --   --   --   ALT 53  --   --   --   --   AST 44*  --   --   --   --   GLUCOSE 119*  < > 177* 151* 130*  < > = values in this interval not displayed. Imaging/Diagnostic Tests: No results found.  Sela Hua,  MD 12/31/2016, 7:25 AM PGY-2, [place] Intern pager: [phone], text pages welcome

## 2016-12-31 NOTE — Progress Notes (Addendum)
Nutrition Follow-up  DOCUMENTATION CODES:   Not applicable  INTERVENTION:   Change TF regmien to bolus feeds of 237 ml (1 can) of Nepro QID via PEG  30 ml Prostat daily.    Tube feeding regimen provides 1806 kcal (100% of needs), 91 grams of protein, and 688 ml of H2O.   Recommend increase free water flush to 250 ml QID (provides 1,000 ml additional free water). Total water 1688 ml.    NUTRITION DIAGNOSIS:   Inadequate oral intake related to inability to eat, dysphagia as evidenced by NPO status.  Ongoing  GOAL:   Patient will meet greater than or equal to 90% of their needs  Met with TF  MONITOR:   Labs, Weight trends, TF tolerance, Skin, I & O's  REASON FOR ASSESSMENT:   Consult Assessment of nutrition requirement/status  ASSESSMENT:   Blake Wilson is a 66 y.o. male presenting from [facility] facility for no PO intake since discharge found to have an AKI. PMH is significant for CAD, HFrEF, dilated cardiomyopathy, T2DM with neuropathy, alcohol abuse, and recent hospitalization for CVA/UTI/rhabdomyolysis.    Received page from attending MD, due to concern for his phosphorus level. MD would like to change TF formula to Nepro. Per MD, plan to d/c back to [facility] today.   Pt transitioned to bolus feeds on 12/30/16; receiving 300 ml Jevity 1.2 5 times daily with 30 ml Prostat daily, and 140 ml free water flush QID. Complete feeding regimen provides 1900 kcals, 98 grams protein, and 1775 ml free water daily, meeting 100% of estimated nutritional needs.   Labs reviewed: Na: 133, CBGS: 159-253, Phos: 5.1.   Diet Order:   NPO  Skin:  Reviewed, no issues  Last BM:  12/30/16  Height:   Ht Readings from Last 1 Encounters:  12/20/16 6' (1.829 m)    Weight:   Wt Readings from Last 1 Encounters:  12/20/16 205 lb 0.4 oz (93 kg)    Ideal Body Weight:  80.9 kg  BMI:  Body mass index is 27.81 kg/m.  Estimated Nutritional Needs:   Kcal:   1800-2000  Protein:  90-105 grams  Fluid:  1.8-2.0 L  EDUCATION NEEDS:   Education needs addressed  Dannisha Eckmann A. Jimmye Norman, RD, LDN, CDE Pager: [phone] After hours Pager: [phone]

## 2016-12-31 NOTE — Progress Notes (Signed)
Patient will DC to: [facility] Anticipated DC date: 12/31/16 Family notified: N/A Transport by: Corey Harold   Per MD patient ready for DC to [facility]. RN, patient, patient's family, and facility notified of DC. Discharge Summary sent to facility. RN given number for report. DC packet on chart. Ambulance transport requested for patient.   CSW signing off.  Cedric Fishman, [place] Social Worker [phone]

## 2017-01-01 ENCOUNTER — Non-Acute Institutional Stay (INDEPENDENT_AMBULATORY_CARE_PROVIDER_SITE_OTHER): Payer: PPO | Admitting: Family Medicine

## 2017-01-01 DIAGNOSIS — Z593 Problems related to living in residential institution: Secondary | ICD-10-CM

## 2017-01-01 DIAGNOSIS — I63012 Cerebral infarction due to thrombosis of left vertebral artery: Secondary | ICD-10-CM

## 2017-01-02 ENCOUNTER — Encounter: Payer: Self-pay | Admitting: Internal Medicine

## 2017-01-02 ENCOUNTER — Other Ambulatory Visit: Payer: Self-pay | Admitting: *Deleted

## 2017-01-02 DIAGNOSIS — Z789 Other specified health status: Secondary | ICD-10-CM | POA: Insufficient documentation

## 2017-01-02 DIAGNOSIS — Z593 Problems related to living in residential institution: Secondary | ICD-10-CM | POA: Insufficient documentation

## 2017-01-02 NOTE — Progress Notes (Signed)
   [facility] Room: [address]  [address] [place], [place] [address]  This is a nursing facility follow up for specific acute issue of  of chronic medical diagnoses  [place] readmission within 30 days  Interim medical record and care since last [place] visit was updated with review of diagnostic studies and change in clinical status since last visit were documented.  HPI:  Review of systems: Dementia invalidated responses. Date given as   onstitutional: No fever,significant weight change, fatigue  Eyes: No redness, discharge, pain, vision change ENT/mouth: No nasal congestion,  purulent discharge, earache,change in hearing ,sore throat  Cardiovascular: No chest pain, palpitations,paroxysmal nocturnal dyspnea, claudication, edema  Respiratory: No cough, sputum production,hemoptysis, DOE , significant snoring,apnea  Gastrointestinal: No heartburn,dysphagia,abdominal pain, nausea / vomiting,rectal bleeding, melena,change in bowels Genitourinary: No dysuria,hematuria, pyuria,  incontinence, nocturia Musculoskeletal: No joint stiffness, joint swelling, weakness,pain Dermatologic: No rash, pruritus, change in appearance of skin Neurologic: No dizziness,headache,syncope, seizures, numbness , tingling Psychiatric: No significant anxiety , depression, insomnia, anorexia Endocrine: No change in hair/skin/ nails, excessive thirst, excessive hunger, excessive urination  Hematologic/lymphatic: No significant bruising, lymphadenopathy,abnormal bleeding Allergy/immunology: No itchy/ watery eyes, significant sneezing, urticaria, angioedema  Physical exam:  Pertinent or positive findings: General appearance:Adequately nourished; no acute distress , increased work of breathing is present.   Lymphatic: No lymphadenopathy about the head, neck, axilla . Eyes: No conjunctival inflammation or lid edema is present. There is no scleral  icterus. Ears:  External ear exam shows no significant lesions or deformities.   Nose:  External nasal examination shows no deformity or inflammation. Nasal mucosa are pink and moist without lesions ,exudates Oral exam: lips and gums are healthy appearing.There is no oropharyngeal erythema or exudate . Neck:  No thyromegaly, masses, tenderness noted.    Heart:  Normal rate and regular rhythm. S1 and S2 normal without gallop, murmur, click, rub .  Lungs:Chest clear to auscultation without wheezes, rhonchi,rales , rubs. Abdomen:Bowel sounds are normal. Abdomen is soft and nontender with no organomegaly, hernias,masses. GU: deferred  Extremities:  No cyanosis, clubbing,edema  Neurologic exam : Cn 2-7 intact Strength equal  in upper & lower extremities Balance,Rhomberg,finger to nose testing could not be completed due to clinical state Deep tendon reflexes are equal Skin: Warm & dry w/o tenting. No significant lesions or rash.    See summary under each active problem in the Problem List with associated updated therapeutic plan    This encounter was created in error - please disregard.

## 2017-01-02 NOTE — Patient Outreach (Signed)
[facility] Care Management  01/02/2017  Blake Wilson [birth year] [id number]   Met briefly with patient at bedside of facility. He is able to nod yes and shake no to questions from [name]. RNCM looked for whiteboard for communication and none found in room. RNCM verified with patient that he will use call bell to request help. RNCM briefly discussed [facility] program, patient unsure of d/c plan at this time.   Met with nurse at facility re: whiteboard for communication. She states that patient had one from Speech therapy and she will follow up on it with them as he was to have it available for communication. Also, verified that patient call bell answered if he rings as he cannot verbalize his needs, she states staff aware of patient deficit.   Plan to sign off as he will be followed by [place] nurse.  Also, possible plan for LTC  [facility] E. Laymond Purser, RN, BSN, [place] [phone]) Business Cell  [phone]) Toll Free Office

## 2017-01-02 NOTE — Progress Notes (Signed)
[place]  Visit  Primary Care Provider: Dr. Luiz Blare Location of Care: [facility] Visit Information: Admission Patient alone Source(s) of information for visit: patient  Chief Complaint: Admission to [facility]   Summary of Recent Hospitalizations: Hospitalized initially from 2/23 until 12/18/16 due to being found down in his apartment complex after slipping and being too week to get back up. Denied losing consciousness or hitting his head. Noted increasing weakness for 3 weeks. Concern for dementia noted by inpatient team. Patient was speaking slowly and with a stutter but in complete sentences at admission. Head CT was negative for acute changes. Urinalysis showed too numerous to count WBC and RBC, rare bacteria, and small leukocytes. CK was found to be elevated. Mr. Santagata was admitted to the [facility] Medicine Teaching Service.   Ceftriaxone was initiated with transition to Keflex. Gentle rehydration with fluids given history of heart failure. Given changes in speech and mental status, MRI obtained and showed acute ischemic nonhemorrhagic pontine infarct with multiple chronic infarcts seen. Initiated on Aspirin 325mg  and Plavix 75mg  daily. Stroke workup initiated and showed TSH normal at 3.699 and Lipid Panel with total cholesterol 249, HDL 42, LDL 178. HIV and RPR negative. Vitamin B12 and Folate within normal limits. UDS was negative. 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 Echocardiogram showed diffuse hypokinesis and EF 45-50%  On 12/15/16, Urine Culture returned showing coagulase negative staphylococcus resistant to Keflex, so Mr. Dom was transitioned to Ciprofloxacin. Discharged to [facility] with Dysphagia 3 diet on 12/18/16.  On 12/20/16, nursing staff at [facility] noted increasing difficulty swallowing and speaking and worsening weakness. Poor PO intake given difficulty swallowing. Patient returned to the hospital and was admitted from 3/2 until  12/31/16. Patient was unable to swallow on modified barium swallow. MRI was obtained and showed extension of stroke from previous hospitalization. While unable to take PO, a NG tube was placed for medication administration and Mr. Nicastro was given D5 1/2 NS. A PEG tube was placed on 12/25/16 and procedure was tolerated well. Following placement of PEG Tube, electrolyte abnormalities were noted, including Hyperkalemia improved with Kayexalate and Phosphorus elevated to 5.1. Mr. Bisig was again discharged to [facility] on 12/31/16.  Behavioral Concerns: None  Nutrition Concerns: Watching for Refeeding Syndrome following PEG placement  Wound Care Nurse Concerns: None  PT Concerns and Goals: Decreased strength and fatigue. Fall risk without external assistance. Will work on strength and functional mobility, gait and transfer activities.  Physical Restraint: No    If SNF admission, patient's goal for the rehabilitation admission:  Goals identified by the patient:;  increase overall strength and endurance and functional communication with caregivers     Family Goals: Patient declines contacting family  If SNF admission, discharge disposition goals: Lives alone without family in area  HISTORY OF PRESENT ILLNESS: Outpatient Encounter Prescriptions as of 01/01/2017  Medication Sig  . Amino Acids-Protein Hydrolys (FEEDING SUPPLEMENT, PRO-STAT SUGAR FREE 64,) LIQD Place 30 mLs into feeding tube daily.  Marland Kitchen aspirin 325 MG tablet Place 1 tablet (325 mg total) into feeding tube daily.  Marland Kitchen atorvastatin (LIPITOR) 80 MG tablet Place 1 tablet (80 mg total) into feeding tube at bedtime.  . carvedilol (COREG) 3.125 MG tablet Place 1 tablet (3.125 mg total) into feeding tube 2 (two) times daily with a meal.  . clopidogrel (PLAVIX) 75 MG tablet Place 1 tablet (75 mg total) into feeding tube daily.  . folic acid (FOLVITE) 1 MG tablet Place 1 tablet (1 mg total) into feeding  tube daily.  Marland Kitchen gabapentin (NEURONTIN) 300  MG capsule Take 1-3 tablets qhs per tube as needed for lower extremity pain.  Marland Kitchen insulin glargine (LANTUS) 100 UNIT/ML injection Inject 0.05 mLs (5 Units total) into the skin at bedtime.  . metFORMIN (GLUCOPHAGE) 1000 MG tablet Place 1 tablet (1,000 mg total) into feeding tube 2 (two) times daily with a meal.  . nitroGLYCERIN (NITROSTAT) 0.4 MG SL tablet Place 1 tablet (0.4 mg total) under the tongue every 5 (five) minutes as needed for chest pain.  . Nutritional Supplements (FEEDING SUPPLEMENT, NEPRO CARB STEADY,) LIQD Take 237 mLs by mouth 4 (four) times daily.  . sacubitril-valsartan (ENTRESTO) 49-51 MG Take 1 tablet per tube twice daily  . travoprost, benzalkonium, (TRAVATAN) 0.004 % ophthalmic solution Place 1 drop into both eyes at bedtime.   . Water For Irrigation, Sterile (FREE WATER) SOLN Place 250 mLs into feeding tube 4 (four) times daily.   No facility-administered encounter medications on file as of 01/01/2017.    No Known Allergies History Patient Active Problem List   Diagnosis Date Noted  . Hyperphosphatemia   . Failure to thrive (child)   . Hyperkalemia   . Failure to thrive in adult   . Late effect of cerebrovascular accident (CVA)   . Oropharyngeal dysphagia   . Failure to thrive (0-17) 12/20/2016  . AKI (acute kidney injury) ([place]) 12/20/2016  . Dehydration   . Abnormal EKG 12/16/2016  . Aphasia 12/16/2016  . CVA (cerebral vascular accident) ([place])   . Urinary tract infection with hematuria   . Elevated CK   . Low TSH level 06/13/2014  . Chronic systolic heart failure ([place]) 04/01/2014  . Pain in joint, ankle and foot 04/01/2014  . Pain in lower limb 12/22/2013  . Metatarsalgia of both feet 12/22/2013  . Equinus deformity of foot, acquired 12/22/2013  . Onychomycosis 12/22/2013  . Diabetic neuropathy, painful ([place]) 12/22/2013  . Discomfort of chest wall 10/26/2013  . Sleeping difficulty 10/07/2012  . Left leg pain 10/29/2011  . GOUT, UNSPECIFIED 06/26/2010  .  GLAUCOMA 04/17/2009  . Elevated lipids 03/02/2009  . Coronary atherosclerosis 03/02/2009  . OBESITY 01/17/2009  . Alcohol abuse 01/09/2009  . Congestive dilated cardiomyopathy ([place]) 01/09/2009  . HYPERTENSION, BENIGN SYSTEMIC 12/18/2006  . SLE 12/18/2006   Past Medical History:  Diagnosis Date  . CHF (congestive heart failure) ([place])   . Diabetes mellitus   . Hyperlipidemia   . Lupus   . Mild CAD 2009   Past Surgical History:  Procedure Laterality Date  . IR GENERIC HISTORICAL  12/25/2016   IR GASTROSTOMY TUBE MOD SED 12/25/2016 Sandi Mariscal, MD [facility]-INTERV RAD  . TEE WITHOUT CARDIOVERSION N/A 12/17/2016   Procedure: TRANSESOPHAGEAL ECHOCARDIOGRAM (TEE);  Surgeon: Skeet Latch, MD;  [facility] ENDOSCOPY;  Service: Cardiovascular;  Laterality: N/A;   Family History  Problem Relation Age of Onset  . Cirrhosis Brother   . Alcohol abuse Brother   . Stroke Maternal Grandmother   . Cancer Brother     throat cancer (smoker)    reports that he quit smoking about 15 years ago. He has quit using smokeless tobacco. He reports that he drinks about 0.6 oz of alcohol per week . He reports that he does not use drugs.  Basic Activities of Daily Living   ADLs Independent Needs Assistance Dependent  Bathing   X  Dressing   X  Ambulation  X   Toileting   X (Incontinent)  Eating   X (PEG)  Instrumental Activities of Daily Living  IADL Independent Needs Assistance Dependent  Cooking   X  Housework   X  Manage Medications  X   Manage the telephone X    Shopping for food, clothes, Meds, etc   X  Use transportation   X  Manage Finances X      Falls in the past six months:   yes  Diet:  Nepro Carb Steady Feeding Supplement. Prostat Feeding Tube: PEG Tube  Hydration Status: well hydrated  Communication Barriers: Dysarthric. Can use Data processing manager.  Review of Systems  Patient has ability to communicate answers to ROS: yes See HPI General: Denies fevers, chills,  progressive fatigue, weight gain.  Eyes: Denies pain, blurred vision  Ears/Nose/Throat: Denies ear pain, throat pain, rhinorrhea, nasal congestion.  Cardiovascular: Denies chest pains, palpitations, dyspnea on exertion, orthopnea, peripheral edema.  Respiratory: Denies cough, sputum, dyspnea  Gastrointestinal: Denies abdominal pain, bloating, constipation, diarrhea.  Genitourinary: Denies dysuria, urinary frequency, discharge Musculoskeletal: Denies joint pain, swelling  Skin: Denies skin rash or ulcers. Neurologic: Weakness, Dysarthric  Psychiatric: Denies depression, anxiety, psychosis. Endocrine: Denies weight loss   Geriatric Syndromes: Constipation no ,   Incontinence yes  Dizziness no   Syncope no   Skin problems no   Visual Impairment no   Hearing impairment no  Eating impairment yes  Impaired Memory or Cognition yes   Behavioral problems no   Sleep problems no    PHYSICAL EXAM:. Wt Readings from Last 3 Encounters:  12/20/16 205 lb 0.4 oz (93 kg)  12/20/16 218 lb (98.9 kg)  12/19/16 218 lb (98.9 kg)   Temp Readings from Last 3 Encounters:  12/31/16 98.3 F (36.8 C) (Oral)  12/20/16 98 F (36.7 C)  12/19/16 98.4 F (36.9 C) (Oral)   BP Readings from Last 3 Encounters:  12/31/16 126/89  12/20/16 (!) 88/55  12/19/16 (!) 148/60   Pulse Readings from Last 3 Encounters:  12/31/16 75  12/20/16 66  12/19/16 (!) 53   General: alert, cooperative, no distress, well nourished, pleasant, clean, groomed HEENT:  No scleral icterus, no nasal secretions, [place] moist and no erythema or lesion Neck:  Supple, no lymphadenopathy CV:  RRR, no murmur, no ankle swelling RESP: No resp distress or accessory muscle use.  Clear to ausc bilat. No wheezing, no rales, no rhonchi.  ABD:  Soft, Non-tender, non-distended, +bowel sounds, no masses MSK:  No back pain, no joint pain.  No joint swelling or redness EXT: Warm and well perfused, No cyanosis and No edema    Gait:  Non  ambulatory Skin: No rashes or skin ulcers noted Neurologic: PERRLA. CN intact. Muscle strength 5/5 in upper extremities bilaterally. 4/5 strength in hip flexion noted bilaterally. Sensation intact. Dysarthric, but able to point to communication board to communicate.  Psych: Oriented to Person, Place; Not oriented to Time (20). Modified MOCA attempted given dysarthric status, communicating through written word and communication board.  Visuospatial/Executive Section pictured below. Notes some weakness noted previously in right upper extremity, which he used for this section. Alternative Trail Making Score 0. Cube Drawing Score 0. Clock Drawing Score 3. Total 3.  Naming Section Score: 3 (Communication Board Used) Modified Memory Intact: Had patient draw three items from memory at beginning and end of [place]. Delayed Recall Score of 5. Attention: Modified Forward and Backward Digit Span Score 2 (Wrote Numbers in Correct Order), Vigilance Score 1, Serial Subtraction Score 1. Unable to Complete Language Section due to Dysarthria. Unable to Complete  Abstraction Section. Unable to write complete sentence without prompting on what to write.  Note most exercises above would start out well and then slowly dwindle in execution with time.   TOTAL Modified MOCA: 18/25  Years of Education: 12 Years of Primary School + 1 Year of Scientist, product/process development (last) BMP Latest Ref Rng & Units 12/31/2016 12/31/2016 12/30/2016  Glucose 65 - 99 mg/dL 136(H) 130(H) 151(H)  BUN 6 - 20 mg/dL 27(H) 21(H) 19  Creatinine 0.61 - 1.24 mg/dL 1.19 1.11 1.00  Sodium 135 - 145 mmol/L 133(L) 134(L) 135  Potassium 3.5 - 5.1 mmol/L 4.6 5.2(H) 5.0  Chloride 101 - 111 mmol/L 98(L) 101 101  CO2 22 - 32 mmol/L 24 25 26   Calcium 8.9 - 10.3 mg/dL 9.1 9.4 9.3  , @10RELATIVEDAYS @ Lab Results  Component Value Date   CALCIUM 9.1 12/31/2016   PHOS 5.1 (H) 12/31/2016   LFTs (last) Lab Results  Component Value Date   ALT 53  12/25/2016   AST 44 (H) 12/25/2016   ALKPHOS 71 12/25/2016   BILITOT 1.6 (H) 12/25/2016   Albumin & Total Protein Lab Results  Component Value Date   LABPROT 14.0 12/24/2016   Lipase (last) No results found for: LIPASE Amylase (last) No results found for: AMYLASE CBC (last) CBC Latest Ref Rng & Units 12/31/2016 12/26/2016 12/25/2016  WBC 4.0 - 10.5 K/uL 7.2 5.6 9.0  Hemoglobin 13.0 - 17.0 g/dL 15.2 13.2 14.5  Hematocrit 39.0 - 52.0 % 45.9 39.1 42.4  Platelets 150 - 400 K/uL 261 213 219   Lipids (last) Lab Results  Component Value Date   CHOL 249 (H) 11/14/2016   HDL 42 11/14/2016   LDLCALC 178 (H) 11/14/2016   LDLDIRECT 71 10/07/2012   TRIG 146 11/14/2016   CHOLHDL 5.9 (H) 11/14/2016   Cardiac Enzymes  Lab Results  Component Value Date   CKTOTAL 82 12/20/2016   CKMB 6.8 (H) 06/25/2007   TROPONINI (H) 06/25/2007    0.13        PERSISTENTLY INCREASED TROPONIN VALUES IN THE RANGE OF 0.06-0.49 ng/mL CAN BE SEEN IN:       -UNSTABLE ANGINA   BNP (last 3 results)   Recent Labs  01/26/16 0958  BNP 27.5   ProBNP (last 3 results)  No results for input(s): PROBNP in the last 8760 hours. CBG (last 3)   Recent Labs  12/31/16 0803 12/31/16 1152 12/31/16 1658  GLUCAP 253* 159* 177*   A1c (last) Lab Results  Component Value Date   HGBA1C 10.0 11/05/2016   Imaging    Health Maintenance due or soon due Diabetes Health Maintenance Due  Topic Date Due  . OPHTHALMOLOGY EXAM  11/30/2016  . HEMOGLOBIN A1C  05/05/2017  . FOOT EXAM  12/06/2017   Assessment and Plan:   See Problem List for individual problem's assessment and plans.   CVA: MRI with acute nonhemorrhagic pontine infarct resulting in dysarthria. Currently on Aspirin 325mg  and Clopidogrel 75mg .  - Follow up with Neurology within 4 weeks - Follow up with Cardiology for stroke workup - Plan to continue Aspirin + Plavix for 62months (03/17/17), then transition to Plavix only. - Continue to work with Speech  Therapy, Physical Therapy, and Occupational Therapy - Monitor mental status. High risk of Vascular Dementia.   S/P PEG Tube Placement: NPO otherwise due to swallowing dysfunction. Neurology skeptical that swallowing function will return. - Speech Therapy - NPO with feeds and medications only through PEG - Monitor Electrolytes. Will check  BMP and Phosphorus.   Dilated Cardiomyopathy/CHF/CAD: Echo in 11/2016 with EF 45-50% and diffuse hypokinesis. Currently prescribed Coreg 3.125mg  twice daily and Entresto 49-51mg  twice daily. Previously on Lasix and Imdur.  - Continue Coreg and Entresto - Follow up with Cardiology per above - Monitor BP. Has recently needed titration down of Coreg due to hypotension.  - Note holding Lasix and Imdur. Consider restarting if appropriate.   Diabetes with Neuropathy: Last A1C 10 on 11/05/16. Discharged with 5units of Lantus, Metformin 1000mg  twice daily, Gabapentin 300-900mg . - Continue Lantus 5units, Titrate up as needed. - Continue Metformin 1000mg  twice daily - Continue Gabapentin. - Next A1C due 02/03/17  History of Alcohol Abuse: Last drink 2/21.   Hyperlipidemia: Atorvastatin 80mg  daily. Goal LDL less than 70. - Continue Atorvastatin  History of Glaucoma: Prescribed Travaprost. -Monitor for need for referral to Ophthalmology - Continue Travaprost  Future labs/tests needed: Monitor Potassium and Phosphorus.    Time spent > 51min ;> 50% of time with patient was spent reviewing records, labs, tests and studies, counseling, discussion with family, discussion with nursing facility staff, discussion with patient's referring providers, discussion with patient's specialists in developing plan of care  Family communications: Mr. Eakle declines contacting family. Has one distant cousin who he does not speak with, otherwise no family.  Code Status:  Full Intubation Status: If Needed Intravenous Fluids: If Needed Feeding Tubes: If Needed. PEG Tube in  Place Antibiotics: If Needed Hospitalization: If Needed Emergency contact:  None

## 2017-01-06 ENCOUNTER — Encounter: Payer: Self-pay | Admitting: Family Medicine

## 2017-01-06 LAB — PHOSPHORUS
BUN: 21.3 — ABNORMAL HIGH
CALCIUM: 9.6 mg/dL
CREATININE: 0.92
Carbon Dioxide, Total: 23
Chloride: 95 mmol/L — ABNORMAL LOW
GLUCOSE: 128 — AB
Magnesium: 1.8
PHOSPHORUS: 4
POTASSIUM: 4.8 mmol/L
Sodium: 136

## 2017-01-10 ENCOUNTER — Encounter: Payer: Self-pay | Admitting: Family Medicine

## 2017-01-10 LAB — BASIC METABOLIC PANEL
BUN: 37.5 — ABNORMAL HIGH
CALCIUM: 9.4 mg/dL
CO2: 21 — AB
Chloride: 96 mmol/L — ABNORMAL LOW
Creat: 0.9
Glucose: 116 — ABNORMAL HIGH
POTASSIUM: 5.3 mmol/L — AB
Sodium: 136

## 2017-01-19 DIAGNOSIS — I2510 Atherosclerotic heart disease of native coronary artery without angina pectoris: Secondary | ICD-10-CM | POA: Diagnosis not present

## 2017-01-19 DIAGNOSIS — I69320 Aphasia following cerebral infarction: Secondary | ICD-10-CM | POA: Diagnosis not present

## 2017-01-19 DIAGNOSIS — B958 Unspecified staphylococcus as the cause of diseases classified elsewhere: Secondary | ICD-10-CM | POA: Diagnosis not present

## 2017-01-19 DIAGNOSIS — I69351 Hemiplegia and hemiparesis following cerebral infarction affecting right dominant side: Secondary | ICD-10-CM | POA: Diagnosis not present

## 2017-01-19 DIAGNOSIS — R488 Other symbolic dysfunctions: Secondary | ICD-10-CM | POA: Diagnosis not present

## 2017-01-19 DIAGNOSIS — I420 Dilated cardiomyopathy: Secondary | ICD-10-CM | POA: Diagnosis not present

## 2017-01-19 DIAGNOSIS — I69391 Dysphagia following cerebral infarction: Secondary | ICD-10-CM | POA: Diagnosis not present

## 2017-01-19 DIAGNOSIS — H409 Unspecified glaucoma: Secondary | ICD-10-CM | POA: Diagnosis not present

## 2017-01-19 DIAGNOSIS — E785 Hyperlipidemia, unspecified: Secondary | ICD-10-CM | POA: Diagnosis not present

## 2017-01-19 DIAGNOSIS — I5022 Chronic systolic (congestive) heart failure: Secondary | ICD-10-CM | POA: Diagnosis not present

## 2017-01-19 DIAGNOSIS — Z7982 Long term (current) use of aspirin: Secondary | ICD-10-CM | POA: Diagnosis not present

## 2017-01-19 DIAGNOSIS — R1312 Dysphagia, oropharyngeal phase: Secondary | ICD-10-CM | POA: Diagnosis not present

## 2017-01-19 DIAGNOSIS — Z431 Encounter for attention to gastrostomy: Secondary | ICD-10-CM | POA: Diagnosis not present

## 2017-01-19 DIAGNOSIS — Z7902 Long term (current) use of antithrombotics/antiplatelets: Secondary | ICD-10-CM | POA: Diagnosis not present

## 2017-01-19 DIAGNOSIS — Z794 Long term (current) use of insulin: Secondary | ICD-10-CM | POA: Diagnosis not present

## 2017-01-19 DIAGNOSIS — M6281 Muscle weakness (generalized): Secondary | ICD-10-CM | POA: Diagnosis not present

## 2017-01-19 DIAGNOSIS — E1140 Type 2 diabetes mellitus with diabetic neuropathy, unspecified: Secondary | ICD-10-CM | POA: Diagnosis not present

## 2017-01-19 DIAGNOSIS — I69322 Dysarthria following cerebral infarction: Secondary | ICD-10-CM | POA: Diagnosis not present

## 2017-01-20 NOTE — Progress Notes (Signed)
Cardiology Office Note   Date:  01/21/2017   ID:  Blake Wilson, DOB 03-01-1951, MRN [id number]  PCP:  Acquanetta Sit, MD  Cardiologist:    Dr. Johnsie Cancel   Chief Complaint  Patient presents with  . Hospitalization Follow-up      History of Present Illness: Blake Wilson is a 66 y.o. male who presents for hospitalization, post CVA.  Stroke felt to be small vessel disease, with hx orf other remote strokes.  TEE without thrombus. Per Dr. Tamala Julian, plan continuous ambulatory monitor as an outpatient. Atrial fibrillation in this situation seems to have a less likely  role in the etiology of disease process but should be excluded.    He had a cath note from 2009 evaluating his cardiomyopathy which showed mild nonbstructive CAD - 30-40% distal LM, 40% prox-mid LAD, 30-40% intermediate branch, 30% multiple D1 lesions, 30% Cx, 30% mRCA, LVEF 35%.  He has chronic systolic HF wti prior NICM.  HTN and HLD. may need to consider Zetia or PCSK-9 given his stroke and need for strict control. He continues with cardiomyopathy NICM and now on meds that he is taking more routinely.  Today he is in a wheel chair, cannot speak, but moves all extremities, his cousin is with him. Denies chest pain and SOB.  He believes he is doing well with PT.  BP is stable. No awareness of rapid HR.  Past Medical History:  Diagnosis Date  . CHF (congestive heart failure) ([place])   . Diabetes mellitus   . Hyperlipidemia   . Lupus   . Mild CAD 2009    Past Surgical History:  Procedure Laterality Date  . IR GENERIC HISTORICAL  12/25/2016   IR GASTROSTOMY TUBE MOD SED 12/25/2016 Blake Mariscal, MD [facility]-INTERV RAD  . TEE WITHOUT CARDIOVERSION N/A 12/17/2016   Procedure: TRANSESOPHAGEAL ECHOCARDIOGRAM (TEE);  Surgeon: Skeet Latch, MD;  Location: [facility] ENDOSCOPY;  Service: Cardiovascular;  Laterality: N/A;     Current Outpatient Prescriptions  Medication Sig Dispense Refill  . Amino Acids-Protein Hydrolys (FEEDING SUPPLEMENT,  PRO-STAT SUGAR FREE 64,) LIQD Place 30 mLs into feeding tube daily. 900 mL 0  . aspirin 325 MG tablet Place 1 tablet (325 mg total) into feeding tube daily. 30 tablet 0  . atorvastatin (LIPITOR) 80 MG tablet Place 1 tablet (80 mg total) into feeding tube at bedtime. 30 tablet 12  . carvedilol (COREG) 3.125 MG tablet Place 1 tablet (3.125 mg total) into feeding tube 2 (two) times daily with a meal. 30 tablet 0  . clopidogrel (PLAVIX) 75 MG tablet Place 1 tablet (75 mg total) into feeding tube daily. 30 tablet 0  . folic acid (FOLVITE) 1 MG tablet Place 1 tablet (1 mg total) into feeding tube daily. 30 tablet 0  . gabapentin (NEURONTIN) 300 MG capsule Take 1-3 tablets qhs per tube as needed for lower extremity pain. 90 capsule 3  . insulin glargine (LANTUS) 100 UNIT/ML injection Inject 0.05 mLs (5 Units total) into the skin at bedtime. 10 mL 11  . metFORMIN (GLUCOPHAGE) 1000 MG tablet Place 1 tablet (1,000 mg total) into feeding tube 2 (two) times daily with a meal. 60 tablet 6  . nitroGLYCERIN (NITROSTAT) 0.4 MG SL tablet Place 1 tablet (0.4 mg total) under the tongue every 5 (five) minutes as needed for chest pain. 20 tablet 3  . Nutritional Supplements (FEEDING SUPPLEMENT, NEPRO CARB STEADY,) LIQD Take 237 mLs by mouth 4 (four) times daily. 1000 mL 10  . sacubitril-valsartan (ENTRESTO)  49-51 MG Take 1 tablet per tube twice daily 60 tablet 0  . travoprost, benzalkonium, (TRAVATAN) 0.004 % ophthalmic solution Place 1 drop into both eyes at bedtime.     . Water For Irrigation, Sterile (FREE WATER) SOLN Place 250 mLs into feeding tube 4 (four) times daily. 1000 mL 10   No current facility-administered medications for this visit.     Allergies:   Patient has no known allergies.    Social History:  The patient  reports that he quit smoking about 16 years ago. He has quit using smokeless tobacco. He reports that he drinks about 0.6 oz of alcohol per week . He reports that he does not use drugs.    Family History:  The patient's family history includes Alcohol abuse in his brother; Cancer in his brother; Cirrhosis in his brother; Stroke in his maternal grandmother.    ROS:  General:no colds or fevers, no weight changes Skin:no rashes or ulcers HEENT:no blurred vision, no congestion, aphasia CV:see HPI PUL:see HPI GI:no diarrhea constipation or melena, no indigestion, + tube feedings GU:no hematuria, no dysuria MS:no joint pain, no claudication Neuro:no syncope, no lightheadedness Endo:+ diabetes, no thyroid disease  Wt Readings from Last 3 Encounters:  01/21/17 218 lb (98.9 kg)  01/02/17 205 lb (93 kg)  12/20/16 205 lb 0.4 oz (93 kg)     PHYSICAL EXAM: VS:  BP 128/70   Pulse 88   Ht 6' (1.829 m)   Wt 218 lb (98.9 kg)   BMI 29.57 kg/m  , BMI Body mass index is 29.57 kg/m. General:Pleasant affect, NAD Skin:Warm and dry, brisk capillary refill HEENT:normocephalic, sclera clear, mucus membranes moist, aphasic Neck:supple, no JVD, no bruits  Heart:S1S2 RRR without murmur, gallup, rub or click Lungs:clear without rales, rhonchi, or wheezes AYT:KZSW, non tender, + BS, do not palpate liver spleen or masses Ext:no lower ext edema, 2+ pedal pulses, 2+ radial pulses Neuro:alert and oriented, MAE, follows commands, + facial symmetry    EKG:  EKG is ordered today. The ekg ordered today demonstrates SR nonspecific T wave abnormality no acute changes.    Recent Labs: 01/26/2016: Brain Natriuretic Peptide 27.5 12/14/2016: TSH 3.699 12/25/2016: ALT 53 12/31/2016: Hemoglobin 15.2; Platelets 261 01/01/2017: Magnesium 1.8 01/06/2017: BUN 37.5; Creat 0.90; Potassium 5.3; Sodium 136    Lipid Panel    Component Value Date/Time   CHOL 249 (H) 11/14/2016 0920   TRIG 146 11/14/2016 0920   HDL 42 11/14/2016 0920   CHOLHDL 5.9 (H) 11/14/2016 0920   VLDL 29 11/14/2016 0920   LDLCALC 178 (H) 11/14/2016 0920   LDLDIRECT 71 10/07/2012 1059       Other studies  Reviewed: Additional studies/ records that were reviewed today include: .   Echocardiogram 12/16/16: Study Conclusions - Left ventricle: The cavity size was mildly dilated. Wall thickness was normal. Systolic function was mildly reduced. The estimated ejection fraction was in the range of 45% to 50%. Diffuse hypokinesis. Doppler parameters are consistent with abnormal left ventricular relaxation (grade 1 diastolic dysfunction). - Left atrium: The atrium was mildly dilated. - Atrial septum: No defect or patent foramen ovale was identified.   ASSESSMENT AND PLAN:  1.  Recent CVA and now with aphasia, possibility of a fib as cause. Facility stated they could assist with 30 day event monitor so we will have pt wear.  To rule out PAF.  Follow up with Dr. Johnsie Cancel in 6 weeks.   2. HLD he may not have been taking meds at  home but does receive now.  Will recheck hepatic and CMP in 4 weeks on lipitor 80 mg  3. NICM now on meds correctly.  Will not increase now but may increase the Entresto on next visit. He is coreg as well, EF has improved.   4. HTN controlled  Current medicines are reviewed with the patient today.  The patient Has no concerns regarding medicines.  The following changes have been made:  See above Labs/ tests ordered today include:see above  Disposition:   FU:  see above  Signed, Cecilie Kicks, NP  01/21/2017 10:46 AM    [address], [place], [place], [place] Phone: [phone]; Fax: [phone]

## 2017-01-21 ENCOUNTER — Ambulatory Visit (INDEPENDENT_AMBULATORY_CARE_PROVIDER_SITE_OTHER): Payer: PPO | Admitting: Cardiology

## 2017-01-21 ENCOUNTER — Encounter (INDEPENDENT_AMBULATORY_CARE_PROVIDER_SITE_OTHER): Payer: Self-pay

## 2017-01-21 ENCOUNTER — Encounter: Payer: Self-pay | Admitting: Cardiology

## 2017-01-21 VITALS — BP 128/70 | HR 88 | Ht 72.0 in | Wt 218.0 lb

## 2017-01-21 DIAGNOSIS — I10 Essential (primary) hypertension: Secondary | ICD-10-CM | POA: Diagnosis not present

## 2017-01-21 DIAGNOSIS — E782 Mixed hyperlipidemia: Secondary | ICD-10-CM | POA: Diagnosis not present

## 2017-01-21 DIAGNOSIS — I639 Cerebral infarction, unspecified: Secondary | ICD-10-CM | POA: Diagnosis not present

## 2017-01-21 DIAGNOSIS — I428 Other cardiomyopathies: Secondary | ICD-10-CM

## 2017-01-21 NOTE — Patient Instructions (Signed)
Medication Instructions:  Your physician recommends that you continue on your current medications as directed. Please refer to the Current Medication list given to you today.   Labwork: 1. IN 4 WEEKS YOU WILL NEED TO HAVE FASTING LIPID AND CMET; PLEASE HAVE RESULTS FAXED TO Cecilie Kicks, NP  Testing/Procedures: Your physician has recommended that you wear an 30 DAY event monitor. Event monitors are medical devices that record the heart's electrical activity. Doctors most often [place] these monitors to diagnose arrhythmias. Arrhythmias are problems with the speed or rhythm of the heartbeat. The monitor is a small, portable device. You can wear one while you do your normal daily activities. This is usually used to diagnose what is causing palpitations/syncope (passing out).    Follow-Up: DR. Johnsie Cancel IN 6 WEEKS OR WITH Cecilie Kicks, NP SAME DAY DR. Johnsie Cancel IS IN THE OFFICE  Any Other Special Instructions Will Be Listed Below (If Applicable).     If you need a refill on your cardiac medications before your next appointment, please call your pharmacy.

## 2017-01-22 ENCOUNTER — Encounter: Payer: Self-pay | Admitting: Family Medicine

## 2017-01-22 NOTE — Progress Notes (Signed)
This encounter was created in error - please disregard.

## 2017-01-29 ENCOUNTER — Ambulatory Visit (INDEPENDENT_AMBULATORY_CARE_PROVIDER_SITE_OTHER): Payer: PPO

## 2017-01-29 ENCOUNTER — Other Ambulatory Visit: Payer: Self-pay | Admitting: Cardiology

## 2017-01-29 DIAGNOSIS — I4891 Unspecified atrial fibrillation: Secondary | ICD-10-CM | POA: Diagnosis not present

## 2017-01-29 DIAGNOSIS — I639 Cerebral infarction, unspecified: Secondary | ICD-10-CM

## 2017-01-29 DIAGNOSIS — I428 Other cardiomyopathies: Secondary | ICD-10-CM

## 2017-01-29 DIAGNOSIS — I67848 Other cerebrovascular vasospasm and vasoconstriction: Secondary | ICD-10-CM | POA: Diagnosis not present

## 2017-02-06 ENCOUNTER — Other Ambulatory Visit (HOSPITAL_COMMUNITY): Payer: Self-pay | Admitting: Internal Medicine

## 2017-02-06 DIAGNOSIS — R1310 Dysphagia, unspecified: Secondary | ICD-10-CM

## 2017-02-06 LAB — HEMOGLOBIN A1C: Hemoglobin A1C: 8.1

## 2017-02-07 ENCOUNTER — Encounter: Payer: Self-pay | Admitting: Family Medicine

## 2017-02-11 ENCOUNTER — Ambulatory Visit (HOSPITAL_COMMUNITY)
Admission: RE | Admit: 2017-02-11 | Discharge: 2017-02-11 | Disposition: A | Discharge status: Home / Self Care | Payer: PPO | Source: Ambulatory Visit | Attending: Internal Medicine | Admitting: Internal Medicine

## 2017-02-11 DIAGNOSIS — R633 Feeding difficulties: Secondary | ICD-10-CM | POA: Diagnosis not present

## 2017-02-11 DIAGNOSIS — E119 Type 2 diabetes mellitus without complications: Secondary | ICD-10-CM | POA: Insufficient documentation

## 2017-02-11 DIAGNOSIS — I2510 Atherosclerotic heart disease of native coronary artery without angina pectoris: Secondary | ICD-10-CM | POA: Insufficient documentation

## 2017-02-11 DIAGNOSIS — I110 Hypertensive heart disease with heart failure: Secondary | ICD-10-CM | POA: Insufficient documentation

## 2017-02-11 DIAGNOSIS — R1310 Dysphagia, unspecified: Secondary | ICD-10-CM

## 2017-02-11 DIAGNOSIS — I509 Heart failure, unspecified: Secondary | ICD-10-CM | POA: Diagnosis not present

## 2017-02-11 DIAGNOSIS — E785 Hyperlipidemia, unspecified: Secondary | ICD-10-CM | POA: Diagnosis not present

## 2017-02-11 DIAGNOSIS — M329 Systemic lupus erythematosus, unspecified: Secondary | ICD-10-CM | POA: Insufficient documentation

## 2017-02-11 DIAGNOSIS — R1312 Dysphagia, oropharyngeal phase: Secondary | ICD-10-CM | POA: Insufficient documentation

## 2017-02-11 NOTE — Progress Notes (Signed)
Modified Barium Swallow Progress Note  Patient Details  Name: RENATO SPELLMAN MRN: [id number] Date of Birth: May 26, 1951  Today's Date: 02/11/2017  Modified Barium Swallow completed.  Full report located under Chart Review in the Imaging Section.  Brief recommendations include the following:  Clinical Impression  Mr. Kitson presents as an outpatient for a repeat MBS for possiblity of diet upgrade. The SLP from [facility] was present during the study and reported that the pt has a PEG, has been sneaking foods/drinks, and is not tolerating tube feeds. The study revealed that Mr. Dawood exhibits a moderate-severe oropharyngeal dsyphagia. Oral phase impairments included delayed oral transit, prolonged mastication, and oral holding requiring maximal verbal cues for pt to "swallow" and to "chew". Cup sips of honey thick liquids resulted in anterior spillage due to missing dentition in the front of his oral cavity (teaspoon mitigated this). Observed consistent delayed swallow initiation to the valleculae and pyriform sinuses with each tested consistency and also noted reduced hyolaryngeal excursion throughout evaluation. Aspiration with thin liquids via cup resulted in reflexive cough. Observed penetration to the level of the vocal cords with honey thick liquids resulting in sensation with cough and throat clearing although ineffective in clearing penetrates and aspirates. Given pt's significant oral delays and high likelihood of penetration/aspiration with liquids, recommend pt continue to obtain nutrition via PEG initiating trials of Dys 1 (puree), no liquids with SLP only with advancement of puree trays 3 times a day as tolerated. Recommended free water protocol after oral care for pleasure (in between meals, not with meds). Educated pt and SLP from SNF regarding results and diet recommendations.    Swallow Evaluation Recommendations       SLP Diet Recommendations: Alternative means - long-term;Free  water protocol after oral care;NPO (trials of puree only with SLP)   Liquid Administration via: Cup;No straw   Medication Administration: Via alternative means   Supervision: Full supervision/cueing for compensatory strategies   Compensations: Slow rate;Small sips/bites   Postural Changes: Seated upright at 90 degrees   Oral Care Recommendations: Oral care QID        Houston Siren 02/11/2017,3:11 PM   [name] [employer] [phone]

## 2017-02-18 DIAGNOSIS — Z431 Encounter for attention to gastrostomy: Secondary | ICD-10-CM | POA: Diagnosis not present

## 2017-02-18 DIAGNOSIS — I5022 Chronic systolic (congestive) heart failure: Secondary | ICD-10-CM | POA: Diagnosis not present

## 2017-02-18 DIAGNOSIS — E1142 Type 2 diabetes mellitus with diabetic polyneuropathy: Secondary | ICD-10-CM | POA: Diagnosis not present

## 2017-02-18 DIAGNOSIS — E785 Hyperlipidemia, unspecified: Secondary | ICD-10-CM | POA: Diagnosis not present

## 2017-02-18 DIAGNOSIS — R262 Difficulty in walking, not elsewhere classified: Secondary | ICD-10-CM | POA: Diagnosis not present

## 2017-02-18 DIAGNOSIS — E1140 Type 2 diabetes mellitus with diabetic neuropathy, unspecified: Secondary | ICD-10-CM | POA: Diagnosis not present

## 2017-02-18 DIAGNOSIS — Z931 Gastrostomy status: Secondary | ICD-10-CM | POA: Diagnosis not present

## 2017-02-18 DIAGNOSIS — M79601 Pain in right arm: Secondary | ICD-10-CM | POA: Diagnosis not present

## 2017-02-18 DIAGNOSIS — I10 Essential (primary) hypertension: Secondary | ICD-10-CM | POA: Diagnosis not present

## 2017-02-18 DIAGNOSIS — Z794 Long term (current) use of insulin: Secondary | ICD-10-CM | POA: Diagnosis not present

## 2017-02-18 DIAGNOSIS — I63012 Cerebral infarction due to thrombosis of left vertebral artery: Secondary | ICD-10-CM | POA: Diagnosis not present

## 2017-02-18 DIAGNOSIS — I69320 Aphasia following cerebral infarction: Secondary | ICD-10-CM | POA: Diagnosis not present

## 2017-02-18 DIAGNOSIS — R1312 Dysphagia, oropharyngeal phase: Secondary | ICD-10-CM | POA: Diagnosis not present

## 2017-02-18 DIAGNOSIS — B958 Unspecified staphylococcus as the cause of diseases classified elsewhere: Secondary | ICD-10-CM | POA: Diagnosis not present

## 2017-02-18 DIAGNOSIS — I69391 Dysphagia following cerebral infarction: Secondary | ICD-10-CM | POA: Diagnosis not present

## 2017-02-18 DIAGNOSIS — I69322 Dysarthria following cerebral infarction: Secondary | ICD-10-CM | POA: Diagnosis not present

## 2017-02-18 DIAGNOSIS — I2510 Atherosclerotic heart disease of native coronary artery without angina pectoris: Secondary | ICD-10-CM | POA: Diagnosis not present

## 2017-02-18 DIAGNOSIS — H409 Unspecified glaucoma: Secondary | ICD-10-CM | POA: Diagnosis not present

## 2017-02-18 DIAGNOSIS — Z7982 Long term (current) use of aspirin: Secondary | ICD-10-CM | POA: Diagnosis not present

## 2017-02-18 DIAGNOSIS — I420 Dilated cardiomyopathy: Secondary | ICD-10-CM | POA: Diagnosis not present

## 2017-02-18 DIAGNOSIS — Z7902 Long term (current) use of antithrombotics/antiplatelets: Secondary | ICD-10-CM | POA: Diagnosis not present

## 2017-02-18 DIAGNOSIS — I69351 Hemiplegia and hemiparesis following cerebral infarction affecting right dominant side: Secondary | ICD-10-CM | POA: Diagnosis not present

## 2017-02-18 DIAGNOSIS — E1169 Type 2 diabetes mellitus with other specified complication: Secondary | ICD-10-CM | POA: Diagnosis not present

## 2017-02-18 DIAGNOSIS — F1010 Alcohol abuse, uncomplicated: Secondary | ICD-10-CM | POA: Diagnosis not present

## 2017-02-18 DIAGNOSIS — R4701 Aphasia: Secondary | ICD-10-CM | POA: Diagnosis not present

## 2017-02-18 DIAGNOSIS — M6281 Muscle weakness (generalized): Secondary | ICD-10-CM | POA: Diagnosis not present

## 2017-02-19 ENCOUNTER — Encounter: Payer: Self-pay | Admitting: Family Medicine

## 2017-02-19 LAB — LIPID PANEL
AG Ratio: 1.1
ALBUMIN: 3.8 (ref 3.5–5.2)
ALT: 79 — AB (ref 0–41)
AST: 32 (ref 0–40)
Alkaline Phosphatase: 80 U/L (ref 40–129)
Anion gap: 22.5
BILIRUBIN TOTAL: 0.3 mg/dL (ref 0.2–1.2)
BUN / CREAT RATIO: 50 — AB (ref 6–25)
BUN: 41.5 — AB (ref 6–20)
CHOLESTEROL: 129 (ref 120–200)
CO2: 22 (ref 22–31)
Calcium: 9.4 mg/dL (ref 8.6–10.2)
Chloride: 95 mmol/L — AB (ref 98–107)
Chol/HDL Ratio, serum: 4.1 (ref 2.5–4.5)
Creatine, Serum: 0.83 (ref 0.5–1.2)
EGFR (Non-African Amer.): >90
Globulin: 3.3 (ref 2–3.5)
Glucose: 148 — ABNORMAL HIGH (ref 70–99)
HDL: 32 — AB (ref 35–85)
LDL CALC: 76 (ref 0–99)
Osmolality: 283.1 — AB (ref 285–295)
Potassium: 4.6 mmol/L (ref 3.3–5.1)
SODIUM: 135 — AB (ref 136–145)
TOTAL PROTEIN: 7.1 g/dL (ref 6.0–8.7)
Triglycerides: 108 (ref 30–149)
VLDL: 22 mg/dL (ref 8–39)

## 2017-02-27 ENCOUNTER — Ambulatory Visit (INDEPENDENT_AMBULATORY_CARE_PROVIDER_SITE_OTHER): Payer: PPO | Admitting: Nurse Practitioner

## 2017-02-27 ENCOUNTER — Encounter: Payer: Self-pay | Admitting: Cardiovascular Disease

## 2017-02-27 VITALS — BP 115/71 | HR 97 | Ht 72.0 in | Wt 185.0 lb

## 2017-02-27 DIAGNOSIS — E785 Hyperlipidemia, unspecified: Secondary | ICD-10-CM | POA: Diagnosis not present

## 2017-02-27 DIAGNOSIS — I10 Essential (primary) hypertension: Secondary | ICD-10-CM

## 2017-02-27 DIAGNOSIS — I63012 Cerebral infarction due to thrombosis of left vertebral artery: Secondary | ICD-10-CM | POA: Diagnosis not present

## 2017-02-27 DIAGNOSIS — R4701 Aphasia: Secondary | ICD-10-CM

## 2017-02-27 NOTE — Patient Instructions (Signed)
Per nsg home sheet 

## 2017-02-27 NOTE — Progress Notes (Signed)
[facility]  PATIENT: Blake Wilson DOB: 28-Mar-1951   REASON FOR VISIT: Hospital follow-up for stroke HISTORY FROM: Patient and Vicente Serene his cousin    HISTORY OF PRESENT ILLNESS:Boyd R Farrishis an 66 y.o.malerecently discharged after an admission for pontine CVA affecting speech and swallowing. He had residual deficits of dysarthria, expressive aphasia, and right sided weakness. He was on secondary stroke prevention with ASA, Plavix, and Lipitor. He was readmitted from [facility] 3 weeks later for failure to thrive and severely decreased PO intake thought partially to be due to a further worsening in his swallowing function. AKI was noted on this admission, thought to be due to dehydration. His PMHxincludesCAD, multiple chronic ischemic infarctions, HFrEF, dilated cardiomyopathy, DM2, neuropathy and EtOH abuse.  A follow up MRI was obtained, revealing extension of the previously seen pontine ischemic infarction. CTA of the head and neck showed occluded left vertebral artery. Echocardiogram EF 45-50% no cardiac source of emboli. LDL 178 11 A1c 10. He returns to the stroke clinic today for follow-up. He currently has a feeding tube for medication administration and feedings. He is getting physical therapy and speech therapy. He has minimal speech today he is currently on aspirin and Plavix for secondary stroke prevention. He has no bruising and no bleeding. He is on Lipitor for hyperlipidemia. Repeat hemoglobin A1c on 02/06/2017  was 8.1. His cousin states he was not very motivated initially to perform his therapies however that has changed in the last week or so. He wants to increase his overall strength and endurance and ability to communicate. according to the cousin he returns for reevaluation   REVIEW OF SYSTEMS: Full 14 system review of systems performed and notable only for those listed, all others are neg:  Constitutional: neg  Cardiovascular:  neg Ear/Nose/Throat: neg  Skin: neg Eyes: neg Respiratory: neg Gastroitestinal: neg  Hematology/Lymphatic: neg  Endocrine: neg Musculoskeletal walking difficulty Allergy/Immunology: neg Neurological: Speech difficulty  Psychiatric: Depression Sleep : neg   ALLERGIES: No Known Allergies  HOME MEDICATIONS: Outpatient Medications Prior to Visit  Medication Sig Dispense Refill  . Amino Acids-Protein Hydrolys (FEEDING SUPPLEMENT, PRO-STAT SUGAR FREE 64,) LIQD Place 30 mLs into feeding tube daily. 900 mL 0  . aspirin 325 MG tablet Place 1 tablet (325 mg total) into feeding tube daily. 30 tablet 0  . atorvastatin (LIPITOR) 80 MG tablet Place 1 tablet (80 mg total) into feeding tube at bedtime. 30 tablet 12  . carvedilol (COREG) 3.125 MG tablet Place 1 tablet (3.125 mg total) into feeding tube 2 (two) times daily with a meal. 30 tablet 0  . clopidogrel (PLAVIX) 75 MG tablet Place 1 tablet (75 mg total) into feeding tube daily. 30 tablet 0  . folic acid (FOLVITE) 1 MG tablet Place 1 tablet (1 mg total) into feeding tube daily. 30 tablet 0  . gabapentin (NEURONTIN) 300 MG capsule Take 1-3 tablets qhs per tube as needed for lower extremity pain. 90 capsule 3  . insulin glargine (LANTUS) 100 UNIT/ML injection Inject 0.05 mLs (5 Units total) into the skin at bedtime. 10 mL 11  . metFORMIN (GLUCOPHAGE) 1000 MG tablet Place 1 tablet (1,000 mg total) into feeding tube 2 (two) times daily with a meal. 60 tablet 6  . nitroGLYCERIN (NITROSTAT) 0.4 MG SL tablet Place 1 tablet (0.4 mg total) under the tongue every 5 (five) minutes as needed for chest pain. 20 tablet 3  . Nutritional Supplements (FEEDING SUPPLEMENT, NEPRO CARB STEADY,) LIQD Take 237 mLs  by mouth 4 (four) times daily. 1000 mL 10  . sacubitril-valsartan (ENTRESTO) 49-51 MG Take 1 tablet per tube twice daily 60 tablet 0  . travoprost, benzalkonium, (TRAVATAN) 0.004 % ophthalmic solution Place 1 drop into both eyes at bedtime.     . Water  For Irrigation, Sterile (FREE WATER) SOLN Place 250 mLs into feeding tube 4 (four) times daily. 1000 mL 10   No facility-administered medications prior to visit.     PAST MEDICAL HISTORY: Past Medical History:  Diagnosis Date  . CHF (congestive heart failure) ([place])   . Diabetes mellitus   . Hyperlipidemia   . Lupus   . Mild CAD 2009    PAST SURGICAL HISTORY: Past Surgical History:  Procedure Laterality Date  . IR GENERIC HISTORICAL  12/25/2016   IR GASTROSTOMY TUBE MOD SED 12/25/2016 Sandi Mariscal, MD [facility]  . TEE WITHOUT CARDIOVERSION N/A 12/17/2016   Procedure: TRANSESOPHAGEAL ECHOCARDIOGRAM (TEE);  Surgeon: Skeet Latch, MD;  Location: [facility] ENDOSCOPY;  Service: Cardiovascular;  Laterality: N/A;    FAMILY HISTORY: Family History  Problem Relation Age of Onset  . Cirrhosis Brother   . Alcohol abuse Brother   . Stroke Maternal Grandmother   . Cancer Brother        throat cancer (smoker)    SOCIAL HISTORY: Social History   Social History  . Marital status: Single    Spouse name: N/A  . Number of children: N/A  . Years of education: N/A   Occupational History  . Not on file.   Social History Main Topics  . Smoking status: Former Smoker    Quit date: 01/23/2001  . Smokeless tobacco: Former Systems developer  . Alcohol use 0.6 oz/week    1 Cans of beer per week  . Drug use: No  . Sexual activity: Not Currently    Birth control/ protection: None   Other Topics Concern  . Not on file   Social History Narrative  . No narrative on file     PHYSICAL EXAM  Vitals:   02/27/17 1238  BP: 115/71  Pulse: 97  Weight: 185 lb (83.9 kg)  Height: 6' (1.829 m)   Body mass index is 25.09 kg/m.  Generalized: Well developed, in no acute distress  Head: normocephalic and atraumatic,. Oropharynx benign  Neck: Supple, no carotid bruits  Cardiac: Regular rate rhythm, no murmur  Musculoskeletal: No deformity   Neurological examination   Mentation: Alert , mute.  Follows all  commands . He holds up the correct number of fingers when asked to perform task   Cranial nerve II-XII: Pupils were equal round reactive to light extraocular movements were full, visual field were full on confrontational test. Facial sensation and strength were normal. hearing was intact to finger rubbing bilaterally. Unable to visualize palate  head turning and shoulder shrug were normal and symmetric.Tongue is midline Motor: normal bulk and tone, full strength in the BUE, 4/5 both lower extremities Sensory: normal and symmetric to light touch, pinprick, and  Vibration in the upper and lower extremities  Coordination: finger-nose-finger, no dysmetria Reflexes: Symmetric upper and lower plantar responses were flexor bilaterally. Gait and Station: In wheelchair not ambulated  DIAGNOSTIC DATA (LABS, IMAGING, TESTING) - I reviewed patient records, labs, notes, testing and imaging myself where available.  Lab Results  Component Value Date   WBC 7.2 12/31/2016   HGB 15.2 12/31/2016   HCT 45.9 12/31/2016   MCV 92.5 12/31/2016   PLT 261 12/31/2016  Component Value Date/Time   NA 135 (A) 02/19/2017   K 4.6 02/19/2017   CL 95 (A) 02/19/2017   CO2 22 02/19/2017   GLUCOSE 136 (H) 12/31/2016 1352   BUN 41.5 (A) 02/19/2017   CREATININE 0.90 01/06/2017   CALCIUM 9.4 02/19/2017   PROT 7.1 02/19/2017   ALBUMIN 3.8 02/19/2017   AST 32 02/19/2017   ALT 79 (A) 02/19/2017   ALKPHOS 80 02/19/2017   BILITOT 0.3 02/19/2017   GFRNONAA >90 02/19/2017   GFRNONAA 73 01/26/2016 0958   GFRAA >90 02/19/2017   GFRAA 85 01/26/2016 0958   Lab Results  Component Value Date   CHOL 129 02/19/2017   HDL 42 11/14/2016   LDLCALC 76 02/19/2017   LDLDIRECT 71 10/07/2012   TRIG 108 02/19/2017   CHOLHDL 4.1 02/19/2017   Lab Results  Component Value Date   HGBA1C 8.1 02/06/2017   Lab Results  Component Value Date   VITAMINB12 247 12/14/2016   Lab Results  Component Value Date   TSH 3.699  12/14/2016     ASSESSMENT AND PLAN  66 y.o. year old male  has a past medical history of CHF (congestive heart failure) ([place]); Diabetes mellitus; Hyperlipidemia; Lupus; and Mild CAD (2009). here For hospital follow-up for pontine stroke 2. Patient is currently in a nursing facility obtaining  rehabilitation services. He also has a feeding tube due to failure to thrive  Stressed the importance of management of risk factors to prevent further stroke Continue aspirin and Plavix for secondary stroke prevention Maintain strict control of hypertension with blood pressure goal below 130/90, today's reading 115/71 continue antihypertensive medications Control of diabetes with hemoglobin A1c below 6.5 followed by primary care most recent hemoglobin A1c 8.1 continue diabetic medications Cholesterol with LDL cholesterol less than 70, followed by primary care,  most recent178 continue statin drugs Intensive therapies physical therapy and speech therapy  F/U in 6 months Discussed risk for recurrent stroke/ TIA and answered additional questions for cousin  This was a visit requiring 30 minutes and medical decision making of high complexity with extensive review of history, hospital chart, counseling and answering questions Dennie Bible, [facility], [facility], APRN  [facility] [address] [place], [place] [address] [phone]

## 2017-03-03 NOTE — Progress Notes (Signed)
I agree with the above plan 

## 2017-03-14 ENCOUNTER — Non-Acute Institutional Stay: Payer: PPO | Admitting: Family Medicine

## 2017-03-14 DIAGNOSIS — E1142 Type 2 diabetes mellitus with diabetic polyneuropathy: Secondary | ICD-10-CM

## 2017-03-14 DIAGNOSIS — I10 Essential (primary) hypertension: Secondary | ICD-10-CM | POA: Diagnosis not present

## 2017-03-14 DIAGNOSIS — Z931 Gastrostomy status: Secondary | ICD-10-CM | POA: Diagnosis not present

## 2017-03-14 DIAGNOSIS — I5022 Chronic systolic (congestive) heart failure: Secondary | ICD-10-CM | POA: Diagnosis not present

## 2017-03-14 DIAGNOSIS — E1169 Type 2 diabetes mellitus with other specified complication: Secondary | ICD-10-CM | POA: Diagnosis not present

## 2017-03-14 DIAGNOSIS — E785 Hyperlipidemia, unspecified: Secondary | ICD-10-CM

## 2017-03-14 DIAGNOSIS — R1312 Dysphagia, oropharyngeal phase: Secondary | ICD-10-CM

## 2017-03-14 DIAGNOSIS — I63012 Cerebral infarction due to thrombosis of left vertebral artery: Secondary | ICD-10-CM

## 2017-03-14 DIAGNOSIS — F1010 Alcohol abuse, uncomplicated: Secondary | ICD-10-CM | POA: Diagnosis not present

## 2017-03-14 DIAGNOSIS — I420 Dilated cardiomyopathy: Secondary | ICD-10-CM | POA: Diagnosis not present

## 2017-03-14 DIAGNOSIS — H409 Unspecified glaucoma: Secondary | ICD-10-CM | POA: Diagnosis not present

## 2017-03-14 DIAGNOSIS — E1140 Type 2 diabetes mellitus with diabetic neuropathy, unspecified: Secondary | ICD-10-CM | POA: Diagnosis not present

## 2017-03-14 HISTORY — DX: Gastrostomy status: Z93.1

## 2017-03-14 NOTE — Assessment & Plan Note (Addendum)
Stable/Improving: Last A1C 8.1 on 02/03/17.  - Continue Gabapentin - No longer on Lantus - Continue Metformin - Next A1C due 02/03/17 - Still receiving CBGs QID >> consider decreasing to QD now that not on Lantus

## 2017-03-14 NOTE — Assessment & Plan Note (Signed)
Stable:  - Last drink 12/11/16 

## 2017-03-14 NOTE — Assessment & Plan Note (Signed)
Stable: Echo in 11/2016 with EF 45-50% and diffuse hypokinesis.  - Continue Coreg 3.125mg  twice daily - Entresto 49-51mg  twice daily. - Follow up with Cardiology in June - Had been on Lasix and Imdur. Consider restarting if appropriate.  - Monitor BP/HR

## 2017-03-14 NOTE — Assessment & Plan Note (Signed)
Stable: Neurology skeptical that swallowing function will fully return. - Speech Therapy >> cleared for Puree and Honey Thick - Continues to receive majority of calories from PEG - Monitor Electrolytes.

## 2017-03-14 NOTE — Assessment & Plan Note (Signed)
Stable: Goal LDL less than 70. - Continue Atorvastatin

## 2017-03-14 NOTE — Assessment & Plan Note (Signed)
Stable: MRI with acute nonhemorrhagic pontine infarct resulting in dysarthria.  - DC Aspirin 325mg  - Continue Clopidogrel 75mg .  - Follow up with Neurology 08/2017 - Follow up with Cardiology within the next 4 weeks - Continue to work with Speech Therapy, Physical Therapy, and Occupational Therapy - Monitor mental status. High risk of Vascular Dementia.

## 2017-03-14 NOTE — Progress Notes (Signed)
[place]  Visit  Primary Care Provider: Dr. Sherren Mocha McDiarmid Location of Care: [facility] Visit Information: a scheduled routine follow-up visit Patient accompanied by patient Source(s) of information for visit: patient and nursing home  Chief Complaint: No chief complaint on file.   Nursing Concerns: None, other than listed below  Behavioral Concerns: concerns about depression. (Due to below).  Nutrition Concerns: Patient refusing to take PO although cleared by speech therapy for Puree and Honey Thick  Wound Care Nurse Concerns: none  PT Concerns and Goals: Concerns none;  improved activity tolerance, improved balance and ability to compensate for deficits;  Goals Partially Met  Physical Restraint: No    If SNF admission, patient's goal for the rehabilitation admission:  Goals identified by the patient:;  increase overall strength and endurance and functional communication with caregivers     Family Goals: refusing contact with family   If SNF admission, discharge disposition goals:  Lives alone without family in area   HISTORY OF PRESENT ILLNESS: Outpatient Encounter Prescriptions as of 03/14/2017  Medication Sig  . Amino Acids-Protein Hydrolys (FEEDING SUPPLEMENT, PRO-STAT SUGAR FREE 64,) LIQD Place 30 mLs into feeding tube daily.  Marland Kitchen aspirin 325 MG tablet Place 1 tablet (325 mg total) into feeding tube daily.  Marland Kitchen atorvastatin (LIPITOR) 80 MG tablet Place 1 tablet (80 mg total) into feeding tube at bedtime.  . carvedilol (COREG) 3.125 MG tablet Place 1 tablet (3.125 mg total) into feeding tube 2 (two) times daily with a meal.  . clopidogrel (PLAVIX) 75 MG tablet Place 1 tablet (75 mg total) into feeding tube daily.  . folic acid (FOLVITE) 1 MG tablet Place 1 tablet (1 mg total) into feeding tube daily.  Marland Kitchen gabapentin (NEURONTIN) 300 MG capsule Take 1-3 tablets qhs per tube as needed for lower extremity pain. (Patient taking differently: 600 mg. Take 1-3  tablets qhs per tube as needed for lower extremity pain.)  . insulin glargine (LANTUS) 100 UNIT/ML injection Inject 0.05 mLs (5 Units total) into the skin at bedtime.  . metFORMIN (GLUCOPHAGE) 1000 MG tablet Place 1 tablet (1,000 mg total) into feeding tube 2 (two) times daily with a meal.  . nitroGLYCERIN (NITROSTAT) 0.4 MG SL tablet Place 1 tablet (0.4 mg total) under the tongue every 5 (five) minutes as needed for chest pain.  . Nutritional Supplements (FEEDING SUPPLEMENT, NEPRO CARB STEADY,) LIQD Take 237 mLs by mouth 4 (four) times daily.  . promethazine (PHENERGAN) 25 MG/ML injection Inject 25 mg into the muscle as needed for nausea or vomiting.  . sacubitril-valsartan (ENTRESTO) 49-51 MG Take 1 tablet per tube twice daily  . travoprost, benzalkonium, (TRAVATAN) 0.004 % ophthalmic solution Place 1 drop into both eyes at bedtime.   . Water For Irrigation, Sterile (FREE WATER) SOLN Place 250 mLs into feeding tube 4 (four) times daily.   No facility-administered encounter medications on file as of 03/14/2017.    No Known Allergies History Patient Active Problem List   Diagnosis Date Noted  . S/P percutaneous endoscopic gastrostomy (PEG) tube placement ([place]) 03/14/2017  . Living in nursing home 01/02/2017  . Hyperphosphatemia   . Failure to thrive (child)   . Hyperkalemia   . Failure to thrive in adult   . Late effect of cerebrovascular accident (CVA)   . Oropharyngeal dysphagia   . Failure to thrive (0-17) 12/20/2016  . AKI (acute kidney injury) ([place]) 12/20/2016  . Dehydration   . Abnormal EKG 12/16/2016  . Aphasia 12/16/2016  . CVA (  cerebral vascular accident) ([place])   . Urinary tract infection with hematuria   . Elevated CK   . Low TSH level 06/13/2014  . Chronic systolic heart failure ([place]) 04/01/2014  . Pain in joint, ankle and foot 04/01/2014  . Pain in lower limb 12/22/2013  . Metatarsalgia of both feet 12/22/2013  . Equinus deformity of foot, acquired 12/22/2013  .  Onychomycosis 12/22/2013  . Diabetic neuropathy, painful ([place]) 12/22/2013  . Discomfort of chest wall 10/26/2013  . Sleeping difficulty 10/07/2012  . Left leg pain 10/29/2011  . GOUT, UNSPECIFIED 06/26/2010  . Unspecified glaucoma 04/17/2009  . Hyperlipidemia due to type 2 diabetes mellitus ([place]) 03/02/2009  . Coronary atherosclerosis 03/02/2009  . OBESITY 01/17/2009  . Alcohol abuse 01/09/2009  . Congestive dilated cardiomyopathy ([place]) 01/09/2009  . HYPERTENSION, BENIGN SYSTEMIC 12/18/2006  . SLE 12/18/2006   Past Medical History:  Diagnosis Date  . CHF (congestive heart failure) ([place])   . Diabetes mellitus   . Hyperlipidemia   . Lupus   . Mild CAD 2009   Past Surgical History:  Procedure Laterality Date  . IR GENERIC HISTORICAL  12/25/2016   IR GASTROSTOMY TUBE MOD SED 12/25/2016 Sandi Mariscal, MD [facility]-INTERV RAD  . TEE WITHOUT CARDIOVERSION N/A 12/17/2016   Procedure: TRANSESOPHAGEAL ECHOCARDIOGRAM (TEE);  Surgeon: Skeet Latch, MD;  Location: [facility] ENDOSCOPY;  Service: Cardiovascular;  Laterality: N/A;   Family History  Problem Relation Age of Onset  . Cirrhosis Brother   . Alcohol abuse Brother   . Stroke Maternal Grandmother   . Cancer Brother        throat cancer (smoker)    reports that he quit smoking about 16 years ago. He has quit using smokeless tobacco. He reports that he does not drink alcohol or use drugs.  Basic Activities of Daily Living   ADLs Independent Needs Assistance Dependent  Bathing   X  Dressing   X  Ambulation  X   Toileting   X (Incontinent)  Eating   X (PEG, and cleared for puree and honey thick)     Instrumental Activities of Daily Living  IADL Independent Needs Assistance Dependent  Cooking   X  Housework   X  Manage Medications  X   Manage the telephone X    Shopping for food, clothes, Meds, etc   X  Use transportation   X  Manage Finances X      Falls in the past six months:   yes  Diet:  Nepro Carb Steady Feeding Supplement.  Prostat (PEG); Pureed and honey thick diet (PO) Feeding Tube: Yes PEG  Hydration Status: well hydrated  Communication Barriers: Dysarthric  Review of Systems  Patient has ability to communicate answers to ROS: yes See HPI General: Denies fevers, chills, progressive fatigue, weight gain.  Eyes: Denies pain, blurred vision  Ears/Nose/Throat: Denies ear pain, throat pain, rhinorrhea, nasal congestion.  Cardiovascular: Denies chest pains, palpitations, dyspnea on exertion, orthopnea, peripheral edema.  Respiratory: Denies cough, sputum, dyspnea  Gastrointestinal: Denies abdominal pain, bloating, constipation, diarrhea.  Genitourinary: Denies dysuria, urinary frequency, discharge Musculoskeletal: Denies joint pain, swelling, weakness.  Skin: Denies skin rash or ulcers. Neurologic: Denies transient paralysis, weakness, paresthesias, headache.  Psychiatric: Denies depression, anxiety, psychosis. Endocrine: Denies weight loss    Geriatric Syndromes: Constipation no ,   Incontinence yes  Dizziness no   Syncope no   Skin problems no   Visual Impairment no   Hearing impairment no  Eating impairment yes  Impaired Memory or  Cognition yes   Behavioral problems no   Sleep problems no   Weight loss yes >> 15lbs since 12/2016  Mood: constricted   Psychotropic Medication Use:  Sedative-hypnotics / Anxiolytics: No  Antipsychotics: Yes Depakote Antidepressants: No    PHYSICAL EXAM:. Wt Readings from Last 3 Encounters:  02/27/17 185 lb (83.9 kg)  01/21/17 218 lb (98.9 kg)  01/02/17 205 lb (93 kg)   Temp Readings from Last 3 Encounters:  01/02/17 (!) 96.8 F (36 C) (Oral)  12/31/16 98.3 F (36.8 C) (Oral)  12/20/16 98 F (36.7 C)   BP Readings from Last 3 Encounters:  02/27/17 115/71  01/21/17 128/70  01/02/17 (!) 147/99   Pulse Readings from Last 3 Encounters:  02/27/17 97  01/21/17 88  01/02/17 70    General: alert, cooperative, no distress, well nourished,  pleasant, clean, groomed HEENT:  No scleral icterus, no nasal secretions, [place] moist and no erythema or lesion Neck:  Supple, no lymphadenopathy CV:  RRR, no murmur, no ankle swelling RESP: No resp distress or accessory muscle use.  Clear to ausc bilat. No wheezing, no rales, no rhonchi.  ABD:  Soft, Non-tender, non-distended, +bowel sounds, no masses, PEG tube in place. MSK:  No back pain, no joint pain.  No joint swelling or redness EXT: Warm and well perfused, No cyanosis and No edema    Gait:  Non ambulatory Skin: No rashes or skin ulcers noted Neurologic: PERRLA. CN intact. Muscle strength 5/5 in upper extremities bilaterally. 4/5 strength in hip flexion noted bilaterally. Sensation intact. Dysarthric, but able to point to communication board to communicate.   MMSE or MoCa:  18 / 30 (on 01/01/17) Years of Education: 12 +   Renal/Electrolytes (last) BMP Latest Ref Rng & Units 02/19/2017 01/06/2017 01/01/2017  Glucose 65 - 99 mg/dL - - -  BUN 6 - 20 41.5(A) 37.5(H) 21.3(H)  Creatinine - - 0.90 0.92  BUN/Creat Ratio 6 - 25 50(A) - -  Sodium 136 - 145 135(A) 136 136  Potassium 3.3 - 5.1 mmol/L 4.6 5.3(H) 4.8  Chloride 98 - 107 mmol/L 95(A) 96(L) 95(L)  CO2 22 - 31 22 21(L) 23  Calcium 8.6 - 10.2 mg/dL 9.4 9.4 9.6  , '@10RELATIVEDAYS' @ Lab Results  Component Value Date   CALCIUM 9.4 02/19/2017   PHOS 4 01/01/2017   LFTs (last) Lab Results  Component Value Date   ALT 79 (A) 02/19/2017   AST 32 02/19/2017   ALKPHOS 80 02/19/2017   BILITOT 0.3 02/19/2017   Albumin & Total Protein Lab Results  Component Value Date   LABPROT 14.0 12/24/2016   Lipase (last) No results found for: LIPASE Amylase (last) No results found for: AMYLASE CBC (last) CBC Latest Ref Rng & Units 12/31/2016 12/26/2016 12/25/2016  WBC 4.0 - 10.5 K/uL 7.2 5.6 9.0  Hemoglobin 13.0 - 17.0 g/dL 15.2 13.2 14.5  Hematocrit 39.0 - 52.0 % 45.9 39.1 42.4  Platelets 150 - 400 K/uL 261 213 219   Lipids (last) Lab  Results  Component Value Date   CHOL 129 02/19/2017   HDL 42 11/14/2016   LDLCALC 76 02/19/2017   LDLDIRECT 71 10/07/2012   TRIG 108 02/19/2017   CHOLHDL 4.1 02/19/2017   Cardiac Enzymes  Lab Results  Component Value Date   CKTOTAL 82 12/20/2016   CKMB 6.8 (H) 06/25/2007   TROPONINI (H) 06/25/2007    0.13        PERSISTENTLY INCREASED TROPONIN VALUES IN THE RANGE OF 0.06-0.49 ng/mL CAN  BE SEEN IN:       -UNSTABLE ANGINA   BNP (last 3 results)  No results for input(s): BNP in the last 8760 hours. ProBNP (last 3 results)  No results for input(s): PROBNP in the last 8760 hours. CBG (last 3)  No results for input(s): GLUCAP in the last 72 hours. A1c (last) Lab Results  Component Value Date   HGBA1C 8.1 02/06/2017   Imaging    Health Maintenance due or soon due Diabetes Health Maintenance Due  Topic Date Due  . OPHTHALMOLOGY EXAM  11/30/2016  . HEMOGLOBIN A1C  08/08/2017  . FOOT EXAM  12/06/2017     Assessment and Plan:   See Problem List for individual problem's assessment and plans.   CVA (cerebral vascular accident) ([place]) Stable: MRI with acute nonhemorrhagic pontine infarct resulting in dysarthria.  - DC Aspirin 377m - Continue Clopidogrel 71m  - Follow up with Neurology 08/2017 - Follow up with Cardiology within the next 4 weeks - Continue to work with Speech Therapy, Physical Therapy, and Occupational Therapy - Monitor mental status. High risk of Vascular Dementia.   S/P percutaneous endoscopic gastrostomy (PEG) tube [place]: Neurology skeptical that swallowing function will fully return. - Speech Therapy >> cleared for Puree and Honey Thick - Continues to receive majority of calories from PEG - Monitor Electrolytes.  Congestive dilated cardiomyopathy Stable: Echo in 11/2016 with EF 45-50% and diffuse hypokinesis.  - Continue Coreg 3.12547mwice daily - Entresto 49-77m70mice daily. - Follow up with Cardiology in June - Had been  on Lasix and Imdur. Consider restarting if appropriate.  - Monitor BP/HR  Diabetic neuropathy, painful (HCC) Stable/Improving: Last A1C 8.1 on 02/03/17.  - Continue Gabapentin - No longer on Lantus - Continue Metformin - Next A1C due 02/03/17 - Still receiving CBGs QID >> consider decreasing to QD now that not on Lantus  Alcohol abuse Stable: - Last drink 12/11/16  Hyperlipidemia due to type 2 diabetes mellitus (HCC)[place]: Goal LDL less than 70. - Continue Atorvastatin  Unspecified glaucoma Stable: - Monitor for need for referral to Ophthalmology (will try to get scheduled in the next 4 weeks) - Continue Travaprost   Future labs/tests needed: A1c in 04/2017  Time spent > 20mi42m 50% of time with patient was spent reviewing records, labs, tests and studies, counseling, discussion with family, discussion with nursing facility staff, discussion with patient's referring providers, discussion with patient's specialists in developing plan of care  Family communications: Mr. FarriApplebyines contacting family. Has one distant cousin who he does not speak with, otherwise no family.  Code Status:    Full Intubation Status: If Needed Intravenous Fluids: If Needed Feeding Tubes: If Needed. PEG Tube in Place Antibiotics: If Needed Hospitalization: If Needed Emergency contact:  None Follow Up:  Next 60 days unless acute issues arise.     Ian DElberta LeatherwoodMS,  PGY3 03/14/2017 5:38 PM

## 2017-03-14 NOTE — Assessment & Plan Note (Signed)
Stable: - Monitor for need for referral to Ophthalmology (will try to get scheduled in the next 4 weeks) - Continue Travaprost

## 2017-03-19 ENCOUNTER — Encounter: Payer: Self-pay | Admitting: Family Medicine

## 2017-03-19 NOTE — Addendum Note (Signed)
Addended byWendy Poet, Jacobi Ryant D on: 03/19/2017 12:06 PM   Modules accepted: Level of Service

## 2017-03-19 NOTE — Progress Notes (Signed)
Cardiology Office Note   Date:  03/21/2017   ID:  Blake Wilson, DOB [birth year], MRN [id number]  PCP:  McDiarmid, Blane Ohara, MD  Cardiologist:   Jenkins Rouge, MD   Chief Complaint  Patient presents with  . CSHF      History of Present Illness: Blake Wilson is a 66 y.o. male who presents for post hospital f/u. CVA in February -pontine with difficulty swallowing Went to [facility] and FTT and readmitted for PEG tube placement. Stroke small vessel disease. TEE negative for SOE History of DCM no CAD by Cath 2009.  He does not speak but moves all extremities Was to have 30 day event monitor to r/o PAF. D/c on ASA and plavix per PEG tube after stroke   Reviewed 30 day monitor no PAF NSR average HR 85 bpm 01/29/17 TEE 12/17/16 reviewed:  EF 40-45% midl MR/AR no SOE  Cousin brought him in today he lives in [place] Most nutrition via G tube Still having PT Unable to ambulate on own.   Past Medical History:  Diagnosis Date  . Abnormal EKG 12/16/2016   Inferior and anteroseptal Q wave infarction pattern  . AKI (acute kidney injury) ([place]) 12/20/2016  . CHF (congestive heart failure) ([place])   . Dehydration   . Diabetes mellitus   . Discomfort of chest wall 10/26/2013  . Elevated CK   . Equinus deformity of foot, acquired 12/22/2013  . Failure to thrive (0-17) 12/20/2016  . Failure to thrive in adult   . Hyperkalemia   . Hyperlipidemia   . Hyperphosphatemia   . Left leg pain 10/29/2011   Et: ?diabetic peripheral neuropathy   Reported improvement with gabapentin in the past    . Low TSH level 06/13/2014  . Lupus   . Metatarsalgia of both feet 12/22/2013  . Mild CAD 2009  . Onychomycosis 12/22/2013  . Pain in joint, ankle and foot 04/01/2014  . Pain in lower limb 12/22/2013  . SLE 12/18/2006   Diagnosed 1993. Presented with abdominal pain and rash. Took prednisone in the past but no medications now in remission.    . Urinary tract infection with hematuria     Past Surgical History:  Procedure  Laterality Date  . IR GENERIC HISTORICAL  12/25/2016   IR GASTROSTOMY TUBE MOD SED 12/25/2016 Sandi Mariscal, MD [facility]-INTERV RAD  . TEE WITHOUT CARDIOVERSION N/A 12/17/2016   Procedure: TRANSESOPHAGEAL ECHOCARDIOGRAM (TEE);  Surgeon: Skeet Latch, MD;  [facility] END[facility]COPY;  Service: Cardiovascular;  Laterality: N/A;     Current Outpatient Prescriptions  Medication Sig Dispense Refill  . Amino Acids-Protein Hydrolys (FEEDING SUPPLEMENT, PRO-STAT SUGAR FREE 64,) LIQD Place 30 mLs into feeding tube daily. 900 mL 0  . atorvastatin (LIPITOR) 80 MG tablet Place 1 tablet (80 mg total) into feeding tube at bedtime. 30 tablet 12  . carvedilol (COREG) 3.125 MG tablet Place 1 tablet (3.125 mg total) into feeding tube 2 (two) times daily with a meal. 30 tablet 0  . clopidogrel (PLAVIX) 75 MG tablet Place 1 tablet (75 mg total) into feeding tube daily. 30 tablet 0  . divalproex (DEPAKOTE SPRINKLE) 125 MG capsule Take 125 mg by mouth 3 (three) times daily.    . folic acid (FOLVITE) 1 MG tablet Place 1 tablet (1 mg total) into feeding tube daily. 30 tablet 0  . gabapentin (NEURONTIN) 600 MG tablet Take 600 mg by mouth at bedtime.    . metFORMIN (GLUCOPHAGE) 1000 MG tablet Place 1 tablet (1,000  mg total) into feeding tube 2 (two) times daily with a meal. 60 tablet 6  . nitroGLYCERIN (NITROSTAT) 0.4 MG SL tablet Place 1 tablet (0.4 mg total) under the tongue every 5 (five) minutes as needed for chest pain. 20 tablet 3  . Nutritional Supplements (FEEDING SUPPLEMENT, NEPRO CARB STEADY,) LIQD Take 237 mLs by mouth 4 (four) times daily. 1000 mL 10  . promethazine (PHENERGAN) 25 MG/ML injection Inject 25 mg into the muscle as needed for nausea or vomiting.    . sacubitril-valsartan (ENTRESTO) 49-51 MG Take 1 tablet per tube twice daily 60 tablet 0  . travoprost, benzalkonium, (TRAVATAN) 0.004 % ophthalmic solution Place 1 drop into both eyes at bedtime.     . Water For Irrigation, Sterile (FREE WATER) SOLN Place 250  mLs into feeding tube 4 (four) times daily. 1000 mL 10   No current facility-administered medications for this visit.     Allergies:   Patient has no known allergies.    Social History:  The patient  reports that he quit smoking about 16 years ago. He has quit using smokeless tobacco. He reports that he does not drink alcohol or use drugs.   Family History:  The patient's family history includes Alcohol abuse in his brother; Cancer in his brother; Cirrhosis in his brother; Stroke in his maternal grandmother.    ROS:  Please see the history of present illness.   Otherwise, review of systems are positive for none.   All other systems are reviewed and negative.    PHYSICAL EXAM: VS:  BP (!) 94/50   Pulse 85   Ht 6' (1.829 m)   Wt 182 lb 12.8 oz (82.9 kg)   SpO2 98%   BMI 24.79 kg/m  , BMI Body mass index is 24.79 kg/m. Affect appropriate Chronically ill black male  HEENT: normal Neck supple with no adenopathy JVP normal no bruits no thyromegaly Lungs clear with no wheezing and good diaphragmatic motion Heart:  S1/S2 no murmur, no rub, gallop or click PMI normal Abdomen: benighn, BS positve, no tenderness, no AAA G-tube placed  no bruit.  No HSM or HJR Distal pulses intact with no bruits No edema Neuro Aphasic weak in Lower extremities   Skin warm and dry No muscular weakness    EKG:  SR LAFB LVH no acute changes 01/01/17    Recent Labs: 12/14/2016: TSH 3.699 12/31/2016: Hemoglobin 15.2; Platelets 261 01/01/2017: Magnesium 1.8 01/06/2017: Creat 0.90 02/19/2017: ALT 79; BUN 41.5; Potassium 4.6; Sodium 135    Lipid Panel    Component Value Date/Time   CHOL 129 02/19/2017   TRIG 108 02/19/2017   HDL 42 11/14/2016 0920   CHOLHDL 4.1 02/19/2017   CHOLHDL 5.9 (H) 11/14/2016 0920   VLDL 22 02/19/2017   LDLCALC 76 02/19/2017   LDLDIRECT 71 10/07/2012 1059      Wt Readings from Last 3 Encounters:  03/21/17 182 lb 12.8 oz (82.9 kg)  02/27/17 185 lb (83.9 kg)    01/21/17 218 lb (98.9 kg)      Other studies Reviewed: Additional studies/ records that were reviewed today include: [facility] notes loop recorder interrogation .    ASSESSMENT AND PLAN:  1. CVA devastating but he had a smile on his face today. PT/OT unable to speak Unable to ambulate ASA/Plavix Loop with no PAF 2. DCM  euvolemic weight down continue meds including entresto 3. HTN Well controlled.  Continue current medications and low sodium Dash type  diet.   4. PEG Tube/GI  Working normally no infection at sight  5. DM  Discussed low carb diet.  Target hemoglobin A1c is 6.5 or less.  Continue current medications. 6. Cholesterol on statin labs with primary   Consult note for nursing home written and sent with patient for Dr McDiarmid to review  Current medicines are reviewed at length with the patient today.  The patient does not have concerns regarding medicines.  The following changes have been made:  no change  Labs/ tests ordered today include: None  No orders of the defined types were placed in this encounter.    Disposition:   FU with me in 6 months      Signed, Jenkins Rouge, MD  03/21/2017 8:17 AM    [facility] [address], [place], [place]  [address] Phone: [phone]; Fax: [phone]

## 2017-03-19 NOTE — Progress Notes (Addendum)
I have interviewed and examined the patient.  I have discussed the case and verified the key findings with Dr. Alease Frame.   I agree with their assessments and plans as documented in their regulatory visit note.  Problem List Items Addressed This Visit      Unprioritized   HYPERTENSION, BENIGN SYSTEMIC (Chronic) - Stable - continue current therapy   Hyperlipidemia due to type 2 diabetes mellitus (HCC)    Stable: Goal LDL less than 70. - Continue Atorvastatin      Alcohol abuse    Stable: - Last drink 12/11/16      Unspecified glaucoma    Stable: - Monitor for need for referral to Ophthalmology (will try to get scheduled in the next 4 weeks) - Continue Travaprost      Congestive dilated cardiomyopathy ([place])    Stable: Echo in 11/2016 with EF 45-50% and diffuse hypokinesis.  - Continue Coreg 3.125mg  twice daily - Entresto 49-51mg  twice daily. - Follow up with Cardiology in June - Had been on Lasix and Imdur. Consider restarting if appropriate.  - Monitor BP/HR      Diabetic neuropathy, painful ([place]) and DMT2 with neurologic complications    Stable/Improving: Last A1C 8.1 on 02/03/17.  - Continue Gabapentin - No longer on Lantus - Continue Metformin - Next A1C due 02/03/17 - Still receiving CBGs QID >> consider decreasing to QD now that not on Lantus      Chronic systolic heart failure (HCC)   CVA (cerebral vascular accident) ([place]) - Primary    Stable: MRI with acute nonhemorrhagic pontine infarct resulting in dysarthria.  - DC Aspirin 325mg  - Continue Clopidogrel 75mg .  - Follow up with Neurology 08/2017 - Follow up with Cardiology within the next 4 weeks - Continue to work with Speech Therapy, Physical Therapy, and Occupational Therapy - Monitor mental status. High risk of Vascular Dementia.       Oropharyngeal dysphagia - Cleared for puree and HTL per speech therapy eval with [facility]   S/P percutaneous endoscopic gastrostomy (PEG) tube placement (HCC)     Stable: Neurology skeptical that swallowing function will fully return. - Speech Therapy >> cleared for Puree and Honey Thick - Continues to receive majority of calories from PEG - Monitor Electrolytes.

## 2017-03-20 ENCOUNTER — Encounter: Payer: Self-pay | Admitting: Pharmacist

## 2017-03-21 ENCOUNTER — Encounter (INDEPENDENT_AMBULATORY_CARE_PROVIDER_SITE_OTHER): Payer: Self-pay

## 2017-03-21 ENCOUNTER — Encounter: Payer: Self-pay | Admitting: Cardiovascular Disease

## 2017-03-21 ENCOUNTER — Ambulatory Visit (INDEPENDENT_AMBULATORY_CARE_PROVIDER_SITE_OTHER): Payer: PPO | Admitting: Cardiovascular Disease

## 2017-03-21 VITALS — BP 94/50 | HR 85 | Ht 72.0 in | Wt 182.8 lb

## 2017-03-21 DIAGNOSIS — Z794 Long term (current) use of insulin: Secondary | ICD-10-CM | POA: Diagnosis not present

## 2017-03-21 DIAGNOSIS — Z431 Encounter for attention to gastrostomy: Secondary | ICD-10-CM | POA: Diagnosis not present

## 2017-03-21 DIAGNOSIS — I2510 Atherosclerotic heart disease of native coronary artery without angina pectoris: Secondary | ICD-10-CM | POA: Diagnosis not present

## 2017-03-21 DIAGNOSIS — I5022 Chronic systolic (congestive) heart failure: Secondary | ICD-10-CM | POA: Diagnosis not present

## 2017-03-21 DIAGNOSIS — I420 Dilated cardiomyopathy: Secondary | ICD-10-CM | POA: Diagnosis not present

## 2017-03-21 DIAGNOSIS — Z7902 Long term (current) use of antithrombotics/antiplatelets: Secondary | ICD-10-CM | POA: Diagnosis not present

## 2017-03-21 DIAGNOSIS — Z7982 Long term (current) use of aspirin: Secondary | ICD-10-CM | POA: Diagnosis not present

## 2017-03-21 DIAGNOSIS — I69320 Aphasia following cerebral infarction: Secondary | ICD-10-CM | POA: Diagnosis not present

## 2017-03-21 DIAGNOSIS — I69322 Dysarthria following cerebral infarction: Secondary | ICD-10-CM | POA: Diagnosis not present

## 2017-03-21 DIAGNOSIS — I69351 Hemiplegia and hemiparesis following cerebral infarction affecting right dominant side: Secondary | ICD-10-CM | POA: Diagnosis not present

## 2017-03-21 DIAGNOSIS — E1140 Type 2 diabetes mellitus with diabetic neuropathy, unspecified: Secondary | ICD-10-CM | POA: Diagnosis not present

## 2017-03-21 DIAGNOSIS — H409 Unspecified glaucoma: Secondary | ICD-10-CM | POA: Diagnosis not present

## 2017-03-21 DIAGNOSIS — R1312 Dysphagia, oropharyngeal phase: Secondary | ICD-10-CM | POA: Diagnosis not present

## 2017-03-21 DIAGNOSIS — E785 Hyperlipidemia, unspecified: Secondary | ICD-10-CM | POA: Diagnosis not present

## 2017-03-21 DIAGNOSIS — I69391 Dysphagia following cerebral infarction: Secondary | ICD-10-CM | POA: Diagnosis not present

## 2017-03-21 NOTE — Patient Instructions (Signed)

## 2017-04-08 DIAGNOSIS — R262 Difficulty in walking, not elsewhere classified: Secondary | ICD-10-CM | POA: Diagnosis not present

## 2017-04-08 DIAGNOSIS — M6281 Muscle weakness (generalized): Secondary | ICD-10-CM | POA: Diagnosis not present

## 2017-04-08 DIAGNOSIS — I69351 Hemiplegia and hemiparesis following cerebral infarction affecting right dominant side: Secondary | ICD-10-CM | POA: Diagnosis not present

## 2017-04-09 DIAGNOSIS — M25511 Pain in right shoulder: Secondary | ICD-10-CM | POA: Diagnosis not present

## 2017-04-16 ENCOUNTER — Encounter: Payer: Self-pay | Admitting: Podiatry

## 2017-04-16 ENCOUNTER — Ambulatory Visit (INDEPENDENT_AMBULATORY_CARE_PROVIDER_SITE_OTHER): Payer: PPO | Admitting: Podiatry

## 2017-04-16 VITALS — BP 108/71 | HR 71 | Resp 18

## 2017-04-16 DIAGNOSIS — B351 Tinea unguium: Secondary | ICD-10-CM

## 2017-04-16 DIAGNOSIS — E1151 Type 2 diabetes mellitus with diabetic peripheral angiopathy without gangrene: Secondary | ICD-10-CM

## 2017-04-16 NOTE — Patient Instructions (Signed)

## 2017-04-16 NOTE — Progress Notes (Signed)
   Subjective:    Patient ID: Blake Wilson, male    DOB: 1951/06/15, 66 y.o.   MRN: [id number]  HPI  This patient presents with a representative from nursing home with a written request to trim toenails. Unable to get a complete history of shoe complaint. Patient has aphasia and does not communicate or seem to understand questioning Patient is a diabetic with a history of neuropathy Patient has history of smoking     Review of Systems  Musculoskeletal: Positive for gait problem.  All other systems reviewed and are negative.      Objective:   Physical Exam  Patient does not respond verbally and has difficulty understanding  Vascular: No peripheral edema bilaterally DP and PT pulses 0/4 bilaterally Capillary reflex within normal limits bilaterally  Neurological: Patient unable respond to 10 g monofilament wire Patient unable respond to tuning fork Ankle reflex reactive bilaterally  Dermatological Atrophic skin with absent hair growth bilaterally The toenails are elongated, brittle, deformed 6666-10  Musculoskeletal: Hammertoe 1-5 right Hammertoe 2-5 left There is no restriction ankle, subtalar, midtarsal joints bilaterally      Assessment & Plan:   Assessment: Diabetic with peripheral arterial disease Diabetic with history of peripheral neuropathy Neglected mycotic toenails 6666-10  Plan: Debridement of toenails 6666-10 mechanically electrically without any bleeding  Reappoint 3 months

## 2017-04-18 ENCOUNTER — Encounter: Payer: Self-pay | Admitting: Pharmacist

## 2017-04-21 DIAGNOSIS — R262 Difficulty in walking, not elsewhere classified: Secondary | ICD-10-CM | POA: Diagnosis not present

## 2017-04-21 DIAGNOSIS — M79601 Pain in right arm: Secondary | ICD-10-CM | POA: Diagnosis not present

## 2017-04-21 DIAGNOSIS — I69351 Hemiplegia and hemiparesis following cerebral infarction affecting right dominant side: Secondary | ICD-10-CM | POA: Diagnosis not present

## 2017-04-21 DIAGNOSIS — M6281 Muscle weakness (generalized): Secondary | ICD-10-CM | POA: Diagnosis not present

## 2017-05-01 ENCOUNTER — Non-Acute Institutional Stay (INDEPENDENT_AMBULATORY_CARE_PROVIDER_SITE_OTHER): Payer: PPO | Admitting: Family Medicine

## 2017-05-01 DIAGNOSIS — E1169 Type 2 diabetes mellitus with other specified complication: Secondary | ICD-10-CM

## 2017-05-01 DIAGNOSIS — I420 Dilated cardiomyopathy: Secondary | ICD-10-CM

## 2017-05-01 DIAGNOSIS — E1140 Type 2 diabetes mellitus with diabetic neuropathy, unspecified: Secondary | ICD-10-CM

## 2017-05-01 DIAGNOSIS — F1010 Alcohol abuse, uncomplicated: Secondary | ICD-10-CM

## 2017-05-01 DIAGNOSIS — I63012 Cerebral infarction due to thrombosis of left vertebral artery: Secondary | ICD-10-CM

## 2017-05-01 DIAGNOSIS — H409 Unspecified glaucoma: Secondary | ICD-10-CM

## 2017-05-01 DIAGNOSIS — Z931 Gastrostomy status: Secondary | ICD-10-CM

## 2017-05-01 DIAGNOSIS — E785 Hyperlipidemia, unspecified: Secondary | ICD-10-CM

## 2017-05-01 LAB — CBC AND DIFFERENTIAL
HEMATOCRIT: 37 — AB (ref 41–53)
HEMOGLOBIN: 12.2 — AB (ref 13.5–17.5)

## 2017-05-01 NOTE — Progress Notes (Signed)
Patient ID: Blake Wilson, male   DOB: [birth year], 66 y.o.   MRN: [id number] I have interviewed and examined the patient.  I have discussed the case and verified the key findings with Dr. Rudie Meyer.   I agree with their assessments and plans as documented in their visit note.   Blake Wilson weight gain appears to be from increasing his nighttime tube feed.  No evidence of hypervolemic state on history or exam.

## 2017-05-01 NOTE — Progress Notes (Signed)
[place]  Visit  Primary Care Provider: Dr. Sherren Mocha McDiarmid Location of Care: [facility] Visit Information: a scheduled routine follow-up visit Patient accompanied by patient Source(s) of information for visit: patient and nursing home  Chief Complaint: No chief complaint on file.   Nursing Concerns: None, other than listed below  Behavioral Concerns: concerns about depression. (Due to below).  Nutrition Concerns: Patient refusing to take PO although cleared by speech therapy for Puree and Honey Thick  Wound Care Nurse Concerns: none  PT Concerns and Goals: Concerns none;  improved activity tolerance, improved balance and ability to compensate for deficits;  Goals Partially Met  Physical Restraint: No    If SNF admission, patient's goal for the rehabilitation admission:  Goals identified by the patient:;  increase overall strength and endurance and functional communication with caregivers     Family Goals: refusing contact with family   If SNF admission, discharge disposition goals:  Lives alone without family in area   HISTORY OF PRESENT ILLNESS: Outpatient Encounter Prescriptions as of 05/01/2017  Medication Sig  . Amino Acids-Protein Hydrolys (FEEDING SUPPLEMENT, PRO-STAT SUGAR FREE 64,) LIQD Place 30 mLs into feeding tube daily.  Marland Kitchen atorvastatin (LIPITOR) 80 MG tablet Place 1 tablet (80 mg total) into feeding tube at bedtime.  . carvedilol (COREG) 3.125 MG tablet Place 1 tablet (3.125 mg total) into feeding tube 2 (two) times daily with a meal.  . clopidogrel (PLAVIX) 75 MG tablet Place 1 tablet (75 mg total) into feeding tube daily.  . divalproex (DEPAKOTE SPRINKLE) 125 MG capsule Take 125 mg by mouth 3 (three) times daily.  . folic acid (FOLVITE) 1 MG tablet Place 1 tablet (1 mg total) into feeding tube daily.  Marland Kitchen gabapentin (NEURONTIN) 600 MG tablet Take 600 mg by mouth at bedtime.  . metFORMIN (GLUCOPHAGE) 1000 MG tablet Place 1 tablet (1,000 mg  total) into feeding tube 2 (two) times daily with a meal.  . mirtazapine (REMERON) 15 MG tablet Place 15 mg into feeding tube at bedtime.  . nitroGLYCERIN (NITROSTAT) 0.4 MG SL tablet Place 1 tablet (0.4 mg total) under the tongue every 5 (five) minutes as needed for chest pain.  . Nutritional Supplements (FEEDING SUPPLEMENT, NEPRO CARB STEADY,) LIQD Take 237 mLs by mouth 4 (four) times daily.  . promethazine (PHENERGAN) 25 MG/ML injection Inject 25 mg into the muscle as needed for nausea or vomiting.  . sacubitril-valsartan (ENTRESTO) 49-51 MG Take 1 tablet per tube twice daily  . travoprost, benzalkonium, (TRAVATAN) 0.004 % ophthalmic solution Place 1 drop into both eyes at bedtime.   . Water For Irrigation, Sterile (FREE WATER) SOLN Place 250 mLs into feeding tube 4 (four) times daily.  . [DISCONTINUED] aspirin 325 MG tablet Place 325 mg into feeding tube daily.   No facility-administered encounter medications on file as of 05/01/2017.    No Known Allergies History Patient Active Problem List   Diagnosis Date Noted  . S/P percutaneous endoscopic gastrostomy (PEG) tube placement ([place]) 03/14/2017  . Living in nursing home 01/02/2017  . Late effect of cerebrovascular accident (CVA)   . Oropharyngeal dysphagia   . Aphasia 12/16/2016  . CVA (cerebral vascular accident) ([place])   . Chronic systolic heart failure ([place]) 04/01/2014  . Diabetic neuropathy, painful ([place]) 12/22/2013  . GOUT, UNSPECIFIED 06/26/2010  . Type 2 diabetes mellitus with diabetic polyneuropathy, without long-term current use of insulin ([place]) 06/29/2009  . Unspecified glaucoma 04/17/2009  . Hyperlipidemia due to type 2 diabetes mellitus ([place]) 03/02/2009  .  Coronary atherosclerosis 03/02/2009  . OBESITY 01/17/2009  . Alcohol abuse 01/09/2009  . Congestive dilated cardiomyopathy ([place]) 01/09/2009  . HYPERTENSION, BENIGN SYSTEMIC 12/18/2006   Past Medical History:  Diagnosis Date  . Abnormal EKG 12/16/2016   Inferior and  anteroseptal Q wave infarction pattern  . AKI (acute kidney injury) ([place]) 12/20/2016  . CHF (congestive heart failure) ([place])   . Dehydration   . Diabetes mellitus   . Discomfort of chest wall 10/26/2013  . Elevated CK   . Equinus deformity of foot, acquired 12/22/2013  . Failure to thrive (0-17) 12/20/2016  . Failure to thrive in adult   . Hyperkalemia   . Hyperlipidemia   . Hyperphosphatemia   . Left leg pain 10/29/2011   Et: ?diabetic peripheral neuropathy   Reported improvement with gabapentin in the past    . Low TSH level 06/13/2014  . Lupus   . Metatarsalgia of both feet 12/22/2013  . Mild CAD 2009  . Onychomycosis 12/22/2013  . Pain in joint, ankle and foot 04/01/2014  . Pain in lower limb 12/22/2013  . SLE 12/18/2006   Diagnosed 1993. Presented with abdominal pain and rash. Took prednisone in the past but no medications now in remission.    . Urinary tract infection with hematuria    Past Surgical History:  Procedure Laterality Date  . IR GENERIC HISTORICAL  12/25/2016   IR GASTROSTOMY TUBE MOD SED 12/25/2016 Sandi Mariscal, MD [facility]-INTERV RAD  . TEE WITHOUT CARDIOVERSION N/A 12/17/2016   Procedure: TRANSESOPHAGEAL ECHOCARDIOGRAM (TEE);  Surgeon: Skeet Latch, MD;  Location: [facility] ENDOSCOPY;  Service: Cardiovascular;  Laterality: N/A;   Family History  Problem Relation Age of Onset  . Cirrhosis Brother   . Alcohol abuse Brother   . Stroke Maternal Grandmother   . Cancer Brother        throat cancer (smoker)    reports that he quit smoking about 16 years ago. He has quit using smokeless tobacco. He reports that he does not drink alcohol or use drugs.  Basic Activities of Daily Living   ADLs Independent Needs Assistance Dependent  Bathing   X  Dressing   X  Ambulation  X   Toileting   X (Incontinent)  Eating   X (PEG, and cleared for puree and honey thick)     Instrumental Activities of Daily Living  IADL Independent Needs Assistance Dependent  Cooking   X  Housework   X  Manage  Medications  X   Manage the telephone X    Shopping for food, clothes, Meds, etc   X  Use transportation   X  Manage Finances X      Falls in the past six months:   yes  Diet:  Nepro Carb Steady Feeding Supplement. Prostat (PEG); Pureed and thin liquid diet (PO) Feeding Tube: Yes PEG  Hydration Status: well hydrated  Communication Barriers: Dysarthric  Review of Systems  Patient has ability to communicate answers to ROS: yes See HPI General: Denies fevers, chills, progressive fatigue, weight gain.  Eyes: Denies pain, blurred vision  Ears/Nose/Throat: Denies ear pain, throat pain, rhinorrhea, nasal congestion.  Cardiovascular: Denies chest pains, palpitations, dyspnea on exertion, orthopnea, peripheral edema.  Respiratory: Denies cough, sputum, dyspnea  Gastrointestinal: Denies abdominal pain, bloating, constipation, diarrhea.  Genitourinary: Denies dysuria, urinary frequency, discharge Musculoskeletal: Denies joint pain, swelling, weakness.  Skin: Denies skin rash or ulcers. Neurologic: Denies transient paralysis, weakness, paresthesias, headache.  Psychiatric: Denies depression, anxiety, psychosis. Endocrine: Denies weight loss  Geriatric Syndromes: Constipation no ,   Incontinence yes  Dizziness no   Syncope no   Skin problems no   Visual Impairment no   Hearing impairment no  Eating impairment yes  Impaired Memory or Cognition yes   Behavioral problems no   Sleep problems no   Weight loss yes >> 32lbs since 12/2016  Mood: constricted   Psychotropic Medication Use:  Sedative-hypnotics / Anxiolytics: No  Antipsychotics: Yes Depakote Antidepressants: No    PHYSICAL EXAM:. Wt Readings from Last 3 Encounters:  03/21/17 182 lb 12.8 oz (82.9 kg)  02/27/17 185 lb (83.9 kg)  01/21/17 218 lb (98.9 kg)   Temp Readings from Last 3 Encounters:  01/02/17 (!) 96.8 F (36 C) (Oral)  12/31/16 98.3 F (36.8 C) (Oral)  12/20/16 98 F (36.7 C)   BP Readings from  Last 3 Encounters:  04/16/17 108/71  03/21/17 (!) 94/50  02/27/17 115/71   Pulse Readings from Last 3 Encounters:  04/16/17 71  03/21/17 85  02/27/17 97    General: alert, cooperative, no distress, well nourished, pleasant, clean, groomed HEENT:  No scleral icterus, no nasal secretions, [place] moist and no erythema or lesion Neck:  Supple, no lymphadenopathy CV:  RRR, no murmur, no ankle swelling RESP: No resp distress or accessory muscle use.  Clear to ausc bilat. No wheezing, no rales, no rhonchi.  ABD:  Soft, Non-tender, non-distended, +bowel sounds, no masses, PEG tube in place. MSK:  No back pain, no joint pain.  No joint swelling or redness. Does have limited ROM in right shoulder and pain EXT: Warm and well perfused, No cyanosis and No edema    Gait:  Non ambulatory Skin: No rashes or skin ulcers noted Neurologic: PERRLA. CN intact. Muscle strength 5/5 in upper extremities bilaterally. 4/5 strength in hip flexion noted bilaterally. Sensation intact. Dysarthric, but able to point to communication board to communicate.   MMSE or MoCa:  18 / 30 (on 01/01/17) Years of Education: 12 +   Renal/Electrolytes (last) BMP Latest Ref Rng & Units 02/19/2017 01/06/2017 01/01/2017  Glucose 65 - 99 mg/dL - - -  BUN 6 - 20 41.5(A) 37.5(H) 21.3(H)  Creatinine - - 0.90 0.92  BUN/Creat Ratio 6 - 25 50(A) - -  Sodium 136 - 145 135(A) 136 136  Potassium 3.3 - 5.1 mmol/L 4.6 5.3(H) 4.8  Chloride 98 - 107 mmol/L 95(A) 96(L) 95(L)  CO2 22 - 31 22 21(L) 23  Calcium 8.6 - 10.2 mg/dL 9.4 9.4 9.6  , _0 @ Lab Results  Component Value Date   CALCIUM 9.4 02/19/2017   PHOS 4 01/01/2017   LFTs (last) Lab Results  Component Value Date   ALT 79 (A) 02/19/2017   AST 32 02/19/2017   ALKPHOS 80 02/19/2017   BILITOT 0.3 02/19/2017   Albumin & Total Protein Lab Results  Component Value Date   LABPROT 14.0 12/24/2016   Lipase (last) No results found for: LIPASE Amylase (last) No  results found for: AMYLASE CBC (last) CBC Latest Ref Rng & Units 12/31/2016 12/26/2016 12/25/2016  WBC 4.0 - 10.5 K/uL 7.2 5.6 9.0  Hemoglobin 13.0 - 17.0 g/dL 15.2 13.2 14.5  Hematocrit 39.0 - 52.0 % 45.9 39.1 42.4  Platelets 150 - 400 K/uL 261 213 219   Lipids (last) Lab Results  Component Value Date   CHOL 129 02/19/2017   HDL 42 11/14/2016   LDLCALC 76 02/19/2017   LDLDIRECT 71 10/07/2012   TRIG 108 02/19/2017   CHOLHDL 4.1  02/19/2017   Cardiac Enzymes  Lab Results  Component Value Date   CKTOTAL 82 12/20/2016   CKMB 6.8 (H) 06/25/2007   TROPONINI (H) 06/25/2007    0.13        PERSISTENTLY INCREASED TROPONIN VALUES IN THE RANGE OF 0.06-0.49 ng/mL CAN BE SEEN IN:       -UNSTABLE ANGINA   BNP (last 3 results)  No results for input(s): BNP in the last 8760 hours. ProBNP (last 3 results)  No results for input(s): PROBNP in the last 8760 hours. CBG (last 3)  No results for input(s): GLUCAP in the last 72 hours. A1c (last) Lab Results  Component Value Date   HGBA1C 8.1 02/06/2017   Imaging    Health Maintenance due or soon due Diabetes Health Maintenance Due  Topic Date Due  . OPHTHALMOLOGY EXAM  11/30/2016  . HEMOGLOBIN A1C  08/08/2017  . FOOT EXAM  12/06/2017     Assessment and Plan:   See Problem List for individual problem's assessment and plans.   CVA (cerebral vascular accident) ([place]) Stable: MRI with acute nonhemorrhagic pontine infarct resulting in dysarthria. Seen by Cardiology on 6/18 who recommended continuing current regimen. - Continue Clopidogrel 97m.  - Follow up with Neurology 08/2017 - Continue to work with Physical Therapy, and Occupational Therapy - Unclear if being seen by Speech.  Placed new consult - Monitor mental status. High risk of Vascular Dementia.   S/P percutaneous endoscopic gastrostomy (PEG) tube placement ([place] Stable: Neurology skeptical that swallowing function will fully return. - Speech Therapy >> cleared for Puree and  thin liquid - Reportedly still refusing most PO, even after adding salt, butter, and gravy - Continues to receive majority of calories from PEG - Monitor Electrolytes.  Congestive dilated cardiomyopathy Stable: Echo in 11/2016 with EF 45-50% and diffuse hypokinesis.  Seen by Cardiology 6/18. Recommended no changes.  - Continue Coreg 3.129mtwice daily - Entresto 49-5139mwice daily. - Consider diuretics if symptomatic - Monitor BP/HR  Diabetic neuropathy, painful (HCC) Stable/Improving: Repeat A1C pending. Order was mistakenly tested for H/H. Fasting CBGs 88-173. - Continue Gabapentin - Continue Metformin 1000m68mD - QD CBGs  Alcohol abuse Stable:  - Last drink 12/11/16  Hyperlipidemia due to type 2 diabetes mellitus (HCC) Stable: Goal LDL less than 70. LDL 76 on 02/19/17.  - Continue Atorvastatin 80mg46mly - recheck in 64mo  3moecified glaucoma Stable: Denies changes in vision. Due for ophthalmology exam. Placed order for referral.  - f/u with opthalmology - Continue Travaprost  Right shoulder pain: Could be new onset impingement 2/2 stroke.  - will offer steroid injection and write for scheduled tylenol   Future labs/tests needed: Ordered, and H/H was mistakenly tested. Repeat order for Hgb A1C placed.   Time spent > 20min 60m0% of time with patient was spent reviewing records, labs, tests and studies, counseling, discussion with family, discussion with nursing facility staff, discussion with patient's referring providers, discussion with patient's specialists in developing plan of care  Family communications: Mr. FarrishMeineres contacting family. Has one distant cousin who he does not speak with, otherwise no family.  Code Status:    Full Intubation Status: If Needed Intravenous Fluids: If Needed Feeding Tubes: If Needed. PEG Tube in Place Antibiotics: If Needed Hospitalization: If Needed Emergency contact:  None Follow Up:  Next 60 days unless acute issues  arise.     Kavian Peters L. Tali Coster,Rosalyn GessneCambridgene Resident PGY-2 05/02/2017 2:20 PM

## 2017-05-02 ENCOUNTER — Encounter: Payer: Self-pay | Admitting: Family Medicine

## 2017-05-02 LAB — HEMOGLOBIN A1C: HEMOGLOBIN A1C: 6.6

## 2017-05-02 NOTE — Assessment & Plan Note (Signed)
Stable: Denies changes in vision. Due for ophthalmology exam. Placed order for referral.  - f/u with opthalmology - Continue Travaprost 

## 2017-05-02 NOTE — Assessment & Plan Note (Signed)
Stable:  - Last drink 12/11/16 

## 2017-05-02 NOTE — Assessment & Plan Note (Signed)
Stable/Improving: Repeat A1C pending. Order was mistakenly tested for H/H. Fasting CBGs 88-173. - Continue Gabapentin - Continue Metformin 1000mg  BID - QD CBGs

## 2017-05-02 NOTE — Assessment & Plan Note (Signed)
Stable: Neurology skeptical that swallowing function will fully return. - Speech Therapy >> cleared for Puree and thin liquid - Reportedly still refusing most PO, even after adding salt, butter, and gravy - Continues to receive majority of calories from PEG - Monitor Electrolytes.

## 2017-05-02 NOTE — Assessment & Plan Note (Signed)
Stable: Goal LDL less than 70. LDL 76 on 02/19/17.  - Continue Atorvastatin 80mg  daily - recheck in 20mo

## 2017-05-02 NOTE — Assessment & Plan Note (Signed)
Stable: MRI with acute nonhemorrhagic pontine infarct resulting in dysarthria. Seen by Cardiology on 6/18 who recommended continuing current regimen. - Continue Clopidogrel 75mg .  - Follow up with Neurology 08/2017 - Continue to work with Physical Therapy, and Occupational Therapy - Unclear if being seen by Speech.  Placed new consult - Monitor mental status. High risk of Vascular Dementia.

## 2017-05-02 NOTE — Assessment & Plan Note (Signed)
Stable: Echo in 11/2016 with EF 45-50% and diffuse hypokinesis.  Seen by Cardiology 6/18. Recommended no changes.  - Continue Coreg 3.125mg  twice daily - Entresto 49-51mg  twice daily. - Consider diuretics if symptomatic - Monitor BP/HR

## 2017-05-05 ENCOUNTER — Non-Acute Institutional Stay: Payer: PPO | Admitting: Internal Medicine

## 2017-05-05 DIAGNOSIS — H6123 Impacted cerumen, bilateral: Secondary | ICD-10-CM | POA: Diagnosis not present

## 2017-05-05 DIAGNOSIS — H9209 Otalgia, unspecified ear: Secondary | ICD-10-CM

## 2017-05-07 ENCOUNTER — Encounter: Payer: Self-pay | Admitting: Internal Medicine

## 2017-05-07 NOTE — Progress Notes (Signed)
   Nursing Home Visit  Phone: [phone]   Date of Visit: 05/05/2017   HPI:  7/16 Went to visit Mr. Justiss as nursing mentioned he reported of right ear ache. Right ear ache for about 1 week. No draining, no issues with hearing. No ringing in the ear, no headaches, no visual changes. No prior history of similar symptoms. No fevers or chills. No sore throat, cough, or nasal congestion/rhinorrhea. No tooth pain. Reports that when he hiccups, he notices the ache more.   Exam:  Gen: NAD, nontoxic appearing Right Ear: no pain with movement of pinna, cerumen impaction, unable to remove cerumen with curette Left Ear: cerumen impaction, unable to remove cerumen with curette.  Neck: no cervical lymphadenopathy  A/P: unable to visualize TM.  Asked nursing to irrigate both ears as unlikely there is a rupture of TM.   7/17: Went to see patient after ear irrigation. He reports that his does not have the ear ache anymore (after irrigation).   Exam: Gen: NAD, nontoxic appearing Right and Left Ear: no pain with movement of pinna, still has cerumen in canal that is obstructing the view of both TMs.    A/P: Ear Ache: resolved after irrigation, but still has cerumen impaction. Will order Debrox ear drops 5 drops in each ear BID x 3 days.    Smiley Houseman, MD PGY [place]

## 2017-05-07 NOTE — Progress Notes (Signed)
Patient ID: Blake Wilson, male   DOB: [birth year], 66 y.o.   MRN: [id number] I have interviewed and examined the patient.  I have discussed the case and verified the key findings with Dr. Dallas Schimke.   I agree with their assessments and plans as documented in their visit note.

## 2017-05-09 ENCOUNTER — Encounter: Payer: Self-pay | Admitting: Internal Medicine

## 2017-05-09 DIAGNOSIS — E1140 Type 2 diabetes mellitus with diabetic neuropathy, unspecified: Secondary | ICD-10-CM | POA: Diagnosis not present

## 2017-05-09 DIAGNOSIS — D649 Anemia, unspecified: Secondary | ICD-10-CM | POA: Diagnosis not present

## 2017-05-09 LAB — BASIC METABOLIC PANEL
BUN: 68 — AB (ref 4–21)
Creatinine: 1.2 (ref 0.6–1.3)
GLUCOSE: 100
Potassium: 5.2 (ref 3.4–5.3)
SODIUM: 136 — AB (ref 137–147)

## 2017-05-09 NOTE — Progress Notes (Signed)
BMP from 7/19 revealed elevated BUN/Cr with stable creatinine. Also mild hyperkalemia to 5.2.   Patient is doing well and resting in bed. No concerns. He does not feel thirsty. Denies any shortness of breath or abdominal pain. Nursing reports he does not drink fluids per mouth. He is currently on 150 cc free water q 4 hours.   On exam: he is in NAD. Lungs are CTAB, abdominal exam is soft, NT, ND and with good bowel sounds. He has dry mucous membranes.  Labs likely consistent with dehydration. Will increase free water flushes to 250cc q 4 hours. Repeat BMP in 1 week. Discussed with Dr. McDiarmid.   Smiley Houseman, MD PGY 2 Family Medicine

## 2017-05-12 NOTE — Progress Notes (Signed)
Patient ID: Blake Wilson, male   DOB: [birth year], 66 y.o.   MRN: [id number] I discussed this patient's case with Dr. Dallas Schimke.  I agree with their plan as documented in their note for today.

## 2017-05-14 DIAGNOSIS — E1140 Type 2 diabetes mellitus with diabetic neuropathy, unspecified: Secondary | ICD-10-CM | POA: Diagnosis not present

## 2017-05-14 DIAGNOSIS — I2510 Atherosclerotic heart disease of native coronary artery without angina pectoris: Secondary | ICD-10-CM | POA: Diagnosis not present

## 2017-05-14 DIAGNOSIS — I420 Dilated cardiomyopathy: Secondary | ICD-10-CM | POA: Diagnosis not present

## 2017-05-14 DIAGNOSIS — D649 Anemia, unspecified: Secondary | ICD-10-CM | POA: Diagnosis not present

## 2017-05-16 ENCOUNTER — Encounter: Payer: Self-pay | Admitting: Internal Medicine

## 2017-05-16 ENCOUNTER — Non-Acute Institutional Stay (INDEPENDENT_AMBULATORY_CARE_PROVIDER_SITE_OTHER): Payer: PPO | Admitting: Internal Medicine

## 2017-05-16 ENCOUNTER — Other Ambulatory Visit: Payer: Self-pay | Admitting: Internal Medicine

## 2017-05-16 DIAGNOSIS — N179 Acute kidney failure, unspecified: Secondary | ICD-10-CM

## 2017-05-16 LAB — BASIC METABOLIC PANEL
Anion gap, serum: 21.4
Anion gap: 22.3
BUN / CREAT RATIO: 30.8 — AB (ref 6–25)
BUN / CREAT RATIO: 58.5 — AB (ref 6–25)
BUN: 68 mg/dL — AB (ref 6–20)
BUN: 71 mg/dL — AB (ref 6–20)
CHLORIDE: 99 mmol/L (ref 98–107)
CREATINE, SERUM: 2.33 mg/dL — AB (ref 0.5–1.2)
Calcium: 9 mg/dL (ref 8.6–10.2)
Calcium: 9.7 mg/dL (ref 8.6–10.2)
Carbon Dioxide, Total: 20 mEq/L — AB (ref 22–31)
Carbon Dioxide, Total: 21 mEq/L — AB (ref 22–31)
Chloride: 94 mmol/L — AB (ref 98–107)
Creat: 1.17 mg/dL (ref 0.5–1.20)
EGFR (African American): 32.43
GFR CALC AF AMER: 74.6
Glucose: 100 mg/dL — AB (ref 70–99)
Glucose: 121 mg/dL — AB (ref 70–99)
OSMOLALITY: 286.8 mosm/kg (ref 285–295)
Osmolality: 291.9 (ref 285–295)
POTASSIUM: 4.5 mmol/L (ref 3.3–5.1)
POTASSIUM: 5.2 mmol/L — AB (ref 3.3–5.1)
SODIUM: 136 mmol/L (ref 136–145)
Sodium: 132 mmol/L — AB (ref 136–145)

## 2017-05-16 NOTE — Progress Notes (Signed)
   [facility] Phone: [phone]   Date of Visit: 05/16/2017   HPI:  AKI - BMP from 7/19 revealed elevated BUN/Cr with generally stable creatinine. Thought likely due to dehydration and increased free water flushes.  - repeat BMP this week on 7/25 with worsening creatinine and higher BUN/Cr ratio. K is normal  - patient is doing well. He is feeling fine and reports he is urinating like normal. No shortness of breath, denies any swelling in his lower extremities   - reports that he does not like the pureed food died he is on and would like to try something else. (communicates by writing down his thoughts) - reports that he can eat fine without choking/difficulty swallowing  ROS: See HPI.  [place]:  HTN HFrEF CVA DM2 HLD S/p PEG tube  PHYSICAL EXAM: BP 136/88   Pulse 88  GEN: NAD, sitting in wheelchair CV: RRR, no murmurs, rubs, or gallops PULM: CTAB, normal effort SKIN: No rash or cyanosis; warm and well-perfused EXTR: No lower extremity edema or calf tenderness  ASSESSMENT/PLAN:  AKI Unclear in etiology. Could be dehydration but odd that there was no improvement with increase in free water flushes. No symptoms of obstruction or infection. Possibly medication related (entresto) but patient has been on this for a while.  - UA and Urine creatinine and Na  - stop Metformin and Entresto due to AKI  - Increase Free water 350cc q 4 hours  - BMP on Monday July 30th   - also ordered SLP eval to repeat swallow study per patient preference.   Smiley Houseman, MD PGY [place]

## 2017-05-16 NOTE — Progress Notes (Signed)
Patient ID: Blake Wilson, male   DOB: Mar 20, 1951, 66 y.o.   MRN: [id number] I discussed the patient's case with  Dr. Dallas Schimke.  I have reviewed lab results. I agree with their plans documented in her nursing home visit note below.

## 2017-05-16 NOTE — Progress Notes (Signed)
Entered results from recent lab draws

## 2017-05-20 DIAGNOSIS — R319 Hematuria, unspecified: Secondary | ICD-10-CM | POA: Diagnosis not present

## 2017-05-20 DIAGNOSIS — D649 Anemia, unspecified: Secondary | ICD-10-CM | POA: Diagnosis not present

## 2017-05-20 DIAGNOSIS — N390 Urinary tract infection, site not specified: Secondary | ICD-10-CM | POA: Diagnosis not present

## 2017-05-20 DIAGNOSIS — D500 Iron deficiency anemia secondary to blood loss (chronic): Secondary | ICD-10-CM | POA: Diagnosis not present

## 2017-05-20 LAB — MICROALBUMIN, URINE
MICROALB UR: 3.7
Microalb, Ur: 20.6

## 2017-05-20 LAB — BASIC METABOLIC PANEL
BUN: 41 — AB (ref 4–21)
Creatinine: 1.3 (ref 0.6–1.3)
Glucose: 102
POTASSIUM: 4.3 (ref 3.4–5.3)
SODIUM: 140 (ref 137–147)

## 2017-05-21 ENCOUNTER — Encounter: Payer: Self-pay | Admitting: Internal Medicine

## 2017-05-21 DIAGNOSIS — I420 Dilated cardiomyopathy: Secondary | ICD-10-CM

## 2017-05-21 DIAGNOSIS — M6281 Muscle weakness (generalized): Secondary | ICD-10-CM | POA: Diagnosis not present

## 2017-05-21 DIAGNOSIS — R1312 Dysphagia, oropharyngeal phase: Secondary | ICD-10-CM | POA: Diagnosis not present

## 2017-05-21 DIAGNOSIS — M79601 Pain in right arm: Secondary | ICD-10-CM | POA: Diagnosis not present

## 2017-05-21 DIAGNOSIS — I69391 Dysphagia following cerebral infarction: Secondary | ICD-10-CM | POA: Diagnosis not present

## 2017-05-21 NOTE — Progress Notes (Signed)
Nursing Home Rounds:   Blake Wilson to see patient. He is doing okay. No concerns today. Informed that he has has UTI. Reports that he actually has been having some urinary frequency but no dysuria.   Vitals: Vitals:   05/21/17 1144  BP: 116/64  Pulse: 76  Gen:  NAD Abdomen: soft, nontender Extremities: no LE edema   Mood: Depakote initially ordered by psych in May. There has been no change in his mood since starting this medication. Therefore we started a taper and will discontinue Depakote today. - We will discontinue Depakote today and monitor for signs or symptoms of depression. If we need to start another agent, would likely choose Celexa.  AKI: Possible causes include decrease fluid intake, infection, medication related. We discontinued Entresto and metformin last week due to rising creatinine. UA also obtained which is significant for UTI. His creatinine from this week has improved to 1.31 from 2.33. His BUN has also improved although still elevated. - Urine sodium is still pending - will repeat BMP in 1 week to monitor creatinine - will decrease free water flushes to 250 every 4 hours - will  touch base with nutrition regarding recommendations on free water  UTI: With rising creatinine will treat as a complicated UTI with Keflex 500 mg 4 times a day for 14 days. (Creatinine clearance has improved with most recent labs (from 7/31).  Congestive dilated cardiomyopathy Entresto was discontinued on 05/16/2017 due to a rise in creatinine. This was replaced with Imdur 30 mg daily and hydralazine 10 mg 3 times a day. Unable to crush Imdur to administer through PEG tube. We'll see if patient is able to Imdur by mouth.   Smiley Houseman, MD PGY 3 Family Medicine

## 2017-05-21 NOTE — Assessment & Plan Note (Signed)
Delene Loll was discontinued on 05/16/2017 due to a rise in creatinine. This was replaced with Imdur 30 mg daily and hydralazine 10 mg 3 times a day. Unable to crush Imdur to administer through PEG tube. We'll see if patient is able to Imdur by mouth.

## 2017-05-22 ENCOUNTER — Other Ambulatory Visit (HOSPITAL_COMMUNITY): Payer: Self-pay | Admitting: Internal Medicine

## 2017-05-22 ENCOUNTER — Encounter: Payer: Self-pay | Admitting: Family Medicine

## 2017-05-22 ENCOUNTER — Telehealth: Payer: Self-pay | Admitting: Family Medicine

## 2017-05-22 DIAGNOSIS — R1310 Dysphagia, unspecified: Secondary | ICD-10-CM

## 2017-05-22 LAB — URINALYSIS
BLOOD UA: POSITIVE — AB
Bilirubin (Urine): NEGATIVE
Glucose, Ur: NEGATIVE
KETONES UA: NEGATIVE
Nitrite Urine, Quantitative: POSITIVE — AB
Protein, Ur: NEGATIVE
Specific Gravity, Urine: 1.015 (ref 1.010–1.030)
Urobilinogen, UA: NORMAL
pH, Urine: 5.5 (ref 5.0–8.0)

## 2017-05-22 LAB — MICROALBUMIN / CREATININE URINE RATIO: MICROALB/CREAT RATIO: 20.6 mg/g (ref <30.0)

## 2017-05-22 NOTE — Telephone Encounter (Signed)
Barium swallow referral form dropped off for at front desk for completion/sign.  Verified that patient section of form has been completed.  Last DOS with Dr. Dallas Schimke was 12/06/16  Placed form in team folder to be completed by clinical staff.  Crista Luria

## 2017-05-23 NOTE — Telephone Encounter (Signed)
Placed in provider's box for review.

## 2017-05-26 ENCOUNTER — Encounter: Payer: Self-pay | Admitting: Internal Medicine

## 2017-05-26 NOTE — Telephone Encounter (Signed)
Left voice message with [facility] at [facility] that form is complete and ready for pickup.  Derl Barrow, RN

## 2017-05-26 NOTE — Progress Notes (Signed)
Delene Loll was discontinued on 05/16/17 due to rist in Cr. This was replaced by Imdur and Hydralazine. However, we cannot crust Imdur to give through PEG and patient was unable to take by mouth. Therefore will discontinue Imdur.

## 2017-05-27 DIAGNOSIS — E119 Type 2 diabetes mellitus without complications: Secondary | ICD-10-CM | POA: Diagnosis not present

## 2017-05-27 DIAGNOSIS — D649 Anemia, unspecified: Secondary | ICD-10-CM | POA: Diagnosis not present

## 2017-05-28 ENCOUNTER — Ambulatory Visit (HOSPITAL_COMMUNITY)
Admission: RE | Admit: 2017-05-28 | Discharge: 2017-05-28 | Disposition: A | Discharge status: Home / Self Care | Payer: PPO | Source: Ambulatory Visit | Attending: Internal Medicine | Admitting: Internal Medicine

## 2017-05-28 ENCOUNTER — Encounter: Payer: Self-pay | Admitting: Internal Medicine

## 2017-05-28 DIAGNOSIS — R1310 Dysphagia, unspecified: Secondary | ICD-10-CM | POA: Insufficient documentation

## 2017-05-28 NOTE — Progress Notes (Signed)
Modified Barium Swallow Progress Note  Patient Details  Name: Blake Wilson MRN: [id number] Date of Birth: 1951-09-12  Today's Date: 05/28/2017  Modified Barium Swallow completed.  Full report located under Chart Review in the Imaging Section.  Brief recommendations include the following:  Clinical Impression  Pt demonstrates swallow function consistent with severe, but improving, motor impairment secondary to apraxia following CVA. Pt is able to follow commands but struggles to initiate some motor movement. He is careful with intake, deliberately taking very small sips with an awareness that this is helpful. Oral function is characterized by slow anterior mastication of all solids (nutrigrain bar/peach/slowest with graham), slow but eventual bolus formation, slow lingual rocking for anterior-posterior transit and eventual swallow trigger as bolus reaches the vallecula in most instances. With thin/nectar boluses larger than 5cc, the liquid reaches the pyrifoms and did result in silent aspriation of thin liquids before the swallow. When bolus is successfully regulated by pt or via teaspoon from SLP, no aspiration occurs and motor function is strong. Trialed a 5cc Provale cup which allowed for self feeding and regulation of bolus to a small enough amount with thin liquids. Recommend pt diet be liberalized to allow soft foods of choice, meals with dys 3 mech soft foods with full supervision and thin liquids via Provale cup. Pts intake is slow and laborious, so he will still need tube feeding for nutrition as he is not expected to complete meals. Discussed with SLP.    Swallow Evaluation Recommendations       SLP Diet Recommendations: Dysphagia 3 (Mech soft) solids;Thin liquid   Liquid Administration via: Other (Comment) (Provale cup)   Medication Administration: Whole meds with puree   Supervision: Full supervision/cueing for compensatory strategies   Compensations: Slow rate;Small  sips/bites   Postural Changes: Seated upright at 90 degrees   Oral Care Recommendations: Oral care BID        Chrisoula Zegarra, Katherene Ponto 05/28/2017,12:45 PM

## 2017-05-28 NOTE — Progress Notes (Signed)
Nursing Home Rounds:  BMP this week with normalized Creatinine. Will discontinue Hydralazine and start Losartan 25mg  daily due to history of HF. Will get BMP in 1 week.   Getting SLP evaluation today.   Smiley Houseman, MD PGY 3 Family Medicine

## 2017-06-02 ENCOUNTER — Encounter: Payer: Self-pay | Admitting: Student

## 2017-06-02 LAB — BASIC METABOLIC PANEL
ANION GAP: 17 (ref ?–30)
BUN / CREAT RATIO: 26.3 — AB (ref 6.0–25.0)
BUN: 23 — AB (ref 4–21)
CHLORIDE: 95 — AB (ref 99–108)
CO2: 25 (ref 22–31)
Calcium: 9.1 (ref 8.7–10.7)
Creatinine, Ser: 0.89 (ref 0.50–1.10)
EGFR (African American): >90
EGFR (Non-African Amer.): 88.77
GLUCOSE: 123 — AB (ref 70–99)
Osmolality Meas: 271.6 mOsm/kg — AB (ref 285–295)
Potassium: 3.9 (ref 3.4–5.3)
SODIUM: 133 — AB (ref 137–147)

## 2017-06-04 ENCOUNTER — Encounter: Payer: Self-pay | Admitting: Internal Medicine

## 2017-06-04 DIAGNOSIS — D649 Anemia, unspecified: Secondary | ICD-10-CM | POA: Diagnosis not present

## 2017-06-04 DIAGNOSIS — N390 Urinary tract infection, site not specified: Secondary | ICD-10-CM | POA: Diagnosis not present

## 2017-06-04 NOTE — Progress Notes (Signed)
Due to improved creatinine, will restart Metformin 500mg  BID with plans to tirate up to 1000mg  BID.

## 2017-06-30 ENCOUNTER — Encounter: Payer: Self-pay | Admitting: Family Medicine

## 2017-07-14 ENCOUNTER — Encounter: Payer: Self-pay | Admitting: Podiatry

## 2017-07-14 ENCOUNTER — Ambulatory Visit (INDEPENDENT_AMBULATORY_CARE_PROVIDER_SITE_OTHER): Payer: PPO | Admitting: Podiatry

## 2017-07-14 DIAGNOSIS — E1151 Type 2 diabetes mellitus with diabetic peripheral angiopathy without gangrene: Secondary | ICD-10-CM | POA: Diagnosis not present

## 2017-07-14 DIAGNOSIS — B351 Tinea unguium: Secondary | ICD-10-CM

## 2017-07-14 NOTE — Progress Notes (Signed)
Patient ID: Blake Wilson, male   DOB: 04/28/1951, 66 y.o.   MRN: [id number]    HPI  This patient presents with a representative from nursing home with a written request to trim toenails. Unable to get a complete history of shoe complaint. Patient has aphasia and does not communicate or seem to understand questioning Patient is a diabetic with a history of neuropathy Patient has history of smoking The patient's cousin is present in the treatment room  Objective:   Patient does not respond verbally and has difficulty understanding Patient is seated in a wheelchair and not able to transfer  Vascular: No peripheral edema bilaterally DP and PT pulses 0/4 bilaterally Capillary reflex within normal limits bilaterally  Neurological: Patient unable respond to 10 g monofilament wire Patient unable respond to tuning fork Ankle reflex reactive bilaterally  Dermatological Atrophic skin with absent hair growth bilaterally The toenails are elongated, brittle, deformed 6-10  Musculoskeletal: Hammertoe 1-5 right Hammertoe 2-5 left  Assessment: Diabetic with peripheral arterial disease Diabetic with history of peripheral neuropathy Neglected mycotic toenails 6-10  Plan: Debridement of toenails 6-10 mechanically electrically without any bleeding  Reappoint 3 months There is no restriction ankle, subtalar, midtarsal joints bilaterally

## 2017-07-14 NOTE — Patient Instructions (Signed)

## 2017-07-16 ENCOUNTER — Ambulatory Visit: Payer: PPO | Admitting: Podiatry

## 2017-07-18 ENCOUNTER — Encounter: Payer: Self-pay | Admitting: Internal Medicine

## 2017-07-18 NOTE — Progress Notes (Signed)
NH Rounds  Blood sugars remain persistently elevated. Will increase Metformin from 500mg  bid to 850mg  bid. Continue to monitor.  Hyman Bible, MD

## 2017-07-31 DIAGNOSIS — E1140 Type 2 diabetes mellitus with diabetic neuropathy, unspecified: Secondary | ICD-10-CM | POA: Diagnosis not present

## 2017-07-31 DIAGNOSIS — E119 Type 2 diabetes mellitus without complications: Secondary | ICD-10-CM | POA: Diagnosis not present

## 2017-07-31 DIAGNOSIS — D649 Anemia, unspecified: Secondary | ICD-10-CM | POA: Diagnosis not present

## 2017-08-17 ENCOUNTER — Telehealth: Payer: Self-pay | Admitting: Family Medicine

## 2017-08-17 DIAGNOSIS — N390 Urinary tract infection, site not specified: Secondary | ICD-10-CM | POA: Diagnosis not present

## 2017-08-17 DIAGNOSIS — Z79899 Other long term (current) drug therapy: Secondary | ICD-10-CM | POA: Diagnosis not present

## 2017-08-17 DIAGNOSIS — E1140 Type 2 diabetes mellitus with diabetic neuropathy, unspecified: Secondary | ICD-10-CM | POA: Diagnosis not present

## 2017-08-17 NOTE — Telephone Encounter (Signed)
**  After Hours/ Emergency Line Call*  Received a call from [facility] to report that [name] has been drinking about 662mL per shift, but decreased UOP to about 1 void per shift. Acting normally, afebrile, normal VSS. Recommended that RN encourage additional PO hydration, bladder scan, and can collect UA. Will defer BMP for now, monitor if this problem continues and consider BMP from there.  Red flags discussed.  Will forward to PCP, geri resident.  Ralene Ok, MD PGY-2, [facility]

## 2017-08-18 DIAGNOSIS — E1140 Type 2 diabetes mellitus with diabetic neuropathy, unspecified: Secondary | ICD-10-CM | POA: Diagnosis not present

## 2017-08-18 DIAGNOSIS — R319 Hematuria, unspecified: Secondary | ICD-10-CM | POA: Diagnosis not present

## 2017-08-20 ENCOUNTER — Encounter: Payer: Self-pay | Admitting: Family Medicine

## 2017-08-20 ENCOUNTER — Non-Acute Institutional Stay: Payer: PPO | Admitting: Family Medicine

## 2017-08-20 DIAGNOSIS — R1314 Dysphagia, pharyngoesophageal phase: Secondary | ICD-10-CM

## 2017-08-20 DIAGNOSIS — Z931 Gastrostomy status: Secondary | ICD-10-CM | POA: Diagnosis not present

## 2017-08-20 NOTE — Progress Notes (Signed)
Patient ID: Blake Wilson, male   DOB: 26-Nov-1950, 66 y.o.   MRN: [id number] [place] Acute Visit  Blake Wilson is alone Sources of clinical information for visit is/are patient, nursing home and past medical records.  HISTORY OF PRESENT ILLNESS: Blake Wilson is a 66 y.o.  male.    Chief Complaint  Patient presents with  . Dysphagia    HPI:   Patient with dysphagia since CVA in February 2018.  PEG tube placed in March in order to maintain hydration and nutrition.  Patient's oral intake has improved and is now off tube feed with maintenance of his body weight with oral intake alone this last week.  No reports concerning for dysphagia nor aspiration from nursing staff.    History Patient Active Problem List   Diagnosis Date Noted  . S/P percutaneous endoscopic gastrostomy (PEG) tube placement ([place]) 03/14/2017  . Living in nursing home 01/02/2017  . Late effect of cerebrovascular accident (CVA)   . Oropharyngeal dysphagia   . Aphasia 12/16/2016  . CVA (cerebral vascular accident) ([place])   . Chronic systolic heart failure ([place]) 04/01/2014  . Diabetic neuropathy, painful ([place]) 12/22/2013  . GOUT, UNSPECIFIED 06/26/2010  . Type 2 diabetes mellitus with diabetic polyneuropathy, without long-term current use of insulin ([place]) 06/29/2009  . Unspecified glaucoma 04/17/2009  . Hyperlipidemia due to type 2 diabetes mellitus ([place]) 03/02/2009  . Coronary atherosclerosis 03/02/2009  . OBESITY 01/17/2009  . Alcohol abuse 01/09/2009  . Congestive dilated cardiomyopathy ([place]) 01/09/2009  . HYPERTENSION, BENIGN SYSTEMIC 12/18/2006     Medications  Current Outpatient Prescriptions:  .  Amino Acids-Protein Hydrolys (FEEDING SUPPLEMENT, PRO-STAT SUGAR FREE 64,) LIQD, Place 30 mLs into feeding tube daily., Disp: 900 mL, Rfl: 0 .  atorvastatin (LIPITOR) 80 MG tablet, Place 1 tablet (80 mg total) into feeding tube at bedtime., Disp: 30 tablet, Rfl: 12 .  carvedilol (COREG) 3.125  MG tablet, Place 1 tablet (3.125 mg total) into feeding tube 2 (two) times daily with a meal., Disp: 30 tablet, Rfl: 0 .  clopidogrel (PLAVIX) 75 MG tablet, Place 1 tablet (75 mg total) into feeding tube daily., Disp: 30 tablet, Rfl: 0 .  folic acid (FOLVITE) 1 MG tablet, Place 1 tablet (1 mg total) into feeding tube daily., Disp: 30 tablet, Rfl: 0 .  gabapentin (NEURONTIN) 600 MG tablet, Take 600 mg by mouth at bedtime., Disp: , Rfl:  .  losartan (COZAAR) 25 MG tablet, Take 25 mg by mouth daily., Disp: , Rfl:  .  metFORMIN (GLUCOPHAGE) 850 MG tablet, Take 850 mg by mouth 2 (two) times daily with a meal., Disp: , Rfl:  .  mirtazapine (REMERON) 15 MG tablet, Place 15 mg into feeding tube at bedtime., Disp: , Rfl:  .  nitroGLYCERIN (NITROSTAT) 0.4 MG SL tablet, Place 1 tablet (0.4 mg total) under the tongue every 5 (five) minutes as needed for chest pain., Disp: 20 tablet, Rfl: 3 .  Nutritional Supplements (FEEDING SUPPLEMENT, NEPRO CARB STEADY,) LIQD, Take 237 mLs by mouth 4 (four) times daily., Disp: 1000 mL, Rfl: 10 .  promethazine (PHENERGAN) 25 MG/ML injection, Inject 25 mg into the muscle as needed for nausea or vomiting., Disp: , Rfl:  .  travoprost, benzalkonium, (TRAVATAN) 0.004 % ophthalmic solution, Place 1 drop into both eyes at bedtime. , Disp: , Rfl:  .  Water For Irrigation, Sterile (FREE WATER) SOLN, Place 250 mLs into feeding tube 4 (four) times daily. (Patient taking differently: Place 250 mLs into  feeding tube every 4 (four) hours. ), Disp: 1000 mL, Rfl: 10   There were no vitals filed for this visit.  ROS: unable to obtain due to patient's aphasic condition.      PHYSICAL EXAM:. General: No acute distress, well nourished, pleasant, sitting in WC in hall. Psych:  Smiled when informed of plan to removed PEG tube.  J Assessment(s):    1. S/P percutaneous endoscopic gastrostomy (PEG) tube placement ([place]) Plan to contact Invasive Radiology to remove PEG tube  2. Dysphagia,  pharyngoesophageal phase Much improved since subcortical stroke over 67-months ago.  Blake Wilson has demonstrated ability to maintain hydration and nutrition with stable body weight for a week without Tube Feeds.    Plan(s): 1. Remove PEG  2.  Continue current diet and consistence of foods/drink 3.  Observe weight and hydration status off tube feeds and just oral intake.    Code Status:     Code Status History    Date Active Date Inactive Code Status Order ID Comments User Context   12/20/2016 10:17 PM 12/31/2016 10:34 PM Full Code [id number]  Nicolette Bang, DO Inpatient   12/13/2016  7:03 PM 12/18/2016  8:25 PM Full Code [id number]  Rogue Bussing, MD Inpatient        There are no discontinued medications.

## 2017-08-21 ENCOUNTER — Other Ambulatory Visit: Payer: Self-pay | Admitting: Family Medicine

## 2017-08-21 DIAGNOSIS — I639 Cerebral infarction, unspecified: Secondary | ICD-10-CM

## 2017-08-26 ENCOUNTER — Encounter (HOSPITAL_COMMUNITY): Payer: Self-pay | Admitting: Interventional Radiology

## 2017-08-26 ENCOUNTER — Ambulatory Visit (HOSPITAL_COMMUNITY)
Admission: RE | Admit: 2017-08-26 | Discharge: 2017-08-26 | Disposition: A | Discharge status: Home / Self Care | Payer: Medicaid Other | Source: Ambulatory Visit | Attending: Family Medicine | Admitting: Family Medicine

## 2017-08-26 DIAGNOSIS — Z8673 Personal history of transient ischemic attack (TIA), and cerebral infarction without residual deficits: Secondary | ICD-10-CM | POA: Diagnosis not present

## 2017-08-26 DIAGNOSIS — Z431 Encounter for attention to gastrostomy: Secondary | ICD-10-CM | POA: Diagnosis not present

## 2017-08-26 DIAGNOSIS — Z4589 Encounter for adjustment and management of other implanted devices: Secondary | ICD-10-CM | POA: Diagnosis not present

## 2017-08-26 DIAGNOSIS — I639 Cerebral infarction, unspecified: Secondary | ICD-10-CM

## 2017-08-26 HISTORY — PX: IR GASTROSTOMY TUBE REMOVAL: IMG5492

## 2017-08-26 MED ORDER — IOPAMIDOL (ISOVUE-300) INJECTION 61%
INTRAVENOUS | Status: AC
  Filled 2017-08-26: qty 100

## 2017-08-26 MED ORDER — ALTEPLASE 2 MG IJ SOLR
INTRAMUSCULAR | Status: AC
  Filled 2017-08-26: qty 2

## 2017-08-26 MED ORDER — LIDOCAINE VISCOUS 2 % MT SOLN
OROMUCOSAL | Status: DC | PRN
  Administered 2017-08-26: 15 mL via OROMUCOSAL

## 2017-08-26 MED ORDER — LIDOCAINE VISCOUS 2 % MT SOLN
OROMUCOSAL | Status: AC
  Filled 2017-08-26: qty 15

## 2017-08-26 MED ORDER — LIDOCAINE HCL 1 % IJ SOLN
INTRAMUSCULAR | Status: AC
  Filled 2017-08-26: qty 20

## 2017-08-27 NOTE — Progress Notes (Deleted)
[facility]  PATIENT: Blake Wilson DOB: Jan 09, 1951   REASON FOR VISIT: Hospital follow-up for stroke HISTORY FROM: Patient and Vicente Serene his cousin    HISTORY OF PRESENT ILLNESS:Blake R Farrishis an 66 y.o.malerecently discharged after an admission for pontine CVA affecting speech and swallowing. He had residual deficits of dysarthria, expressive aphasia, and right sided weakness. He was on secondary stroke prevention with ASA, Plavix, and Lipitor. He was readmitted from [facility] 3 weeks later for failure to thrive and severely decreased PO intake thought partially to be due to a further worsening in his swallowing function. AKI was noted on this admission, thought to be due to dehydration. His PMHxincludesCAD, multiple chronic ischemic infarctions, HFrEF, dilated cardiomyopathy, DM2, neuropathy and EtOH abuse.  A follow up MRI was obtained, revealing extension of the previously seen pontine ischemic infarction. CTA of the head and neck showed occluded left vertebral artery. Echocardiogram EF 45-50% no cardiac source of emboli. LDL 178 11 A1c 10. He returns to the stroke clinic today for follow-up. He currently has a feeding tube for medication administration and feedings. He is getting physical therapy and speech therapy. He has minimal speech today he is currently on aspirin and Plavix for secondary stroke prevention. He has no bruising and no bleeding. He is on Lipitor for hyperlipidemia. Repeat hemoglobin A1c on 02/06/2017  was 8.1. His cousin states he was not very motivated initially to perform his therapies however that has changed in the last week or so. He wants to increase his overall strength and endurance and ability to communicate. according to the cousin he returns for reevaluation   REVIEW OF SYSTEMS: Full 14 system review of systems performed and notable only for those listed, all others are neg:  Constitutional: neg  Cardiovascular:  neg Ear/Nose/Throat: neg  Skin: neg Eyes: neg Respiratory: neg Gastroitestinal: neg  Hematology/Lymphatic: neg  Endocrine: neg Musculoskeletal walking difficulty Allergy/Immunology: neg Neurological: Speech difficulty  Psychiatric: Depression Sleep : neg   ALLERGIES: No Known Allergies  HOME MEDICATIONS: Outpatient Medications Prior to Visit  Medication Sig Dispense Refill  . Amino Acids-Protein Hydrolys (FEEDING SUPPLEMENT, PRO-STAT SUGAR FREE 64,) LIQD Place 30 mLs into feeding tube daily. 900 mL 0  . atorvastatin (LIPITOR) 80 MG tablet Place 1 tablet (80 mg total) into feeding tube at bedtime. 30 tablet 12  . carvedilol (COREG) 3.125 MG tablet Place 1 tablet (3.125 mg total) into feeding tube 2 (two) times daily with a meal. 30 tablet 0  . clopidogrel (PLAVIX) 75 MG tablet Place 1 tablet (75 mg total) into feeding tube daily. 30 tablet 0  . folic acid (FOLVITE) 1 MG tablet Place 1 tablet (1 mg total) into feeding tube daily. 30 tablet 0  . gabapentin (NEURONTIN) 600 MG tablet Take 600 mg by mouth at bedtime.    Marland Kitchen losartan (COZAAR) 25 MG tablet Take 25 mg by mouth daily.    . metFORMIN (GLUCOPHAGE) 850 MG tablet Take 850 mg by mouth 2 (two) times daily with a meal.    . mirtazapine (REMERON) 15 MG tablet Place 15 mg into feeding tube at bedtime.    . nitroGLYCERIN (NITROSTAT) 0.4 MG SL tablet Place 1 tablet (0.4 mg total) under the tongue every 5 (five) minutes as needed for chest pain. 20 tablet 3  . Nutritional Supplements (FEEDING SUPPLEMENT, NEPRO CARB STEADY,) LIQD Take 237 mLs by mouth 4 (four) times daily. 1000 mL 10  . promethazine (PHENERGAN) 25 MG/ML injection Inject 25 mg into  the muscle as needed for nausea or vomiting.    . travoprost, benzalkonium, (TRAVATAN) 0.004 % ophthalmic solution Place 1 drop into both eyes at bedtime.     . Water For Irrigation, Sterile (FREE WATER) SOLN Place 250 mLs into feeding tube 4 (four) times daily. (Patient taking differently: Place  250 mLs into feeding tube every 4 (four) hours. ) 1000 mL 10   No facility-administered medications prior to visit.     PAST MEDICAL HISTORY: Past Medical History:  Diagnosis Date  . Abnormal EKG 12/16/2016   Inferior and anteroseptal Q wave infarction pattern  . AKI (acute kidney injury) ([place]) 12/20/2016  . CHF (congestive heart failure) ([place])   . Dehydration   . Diabetes mellitus   . Discomfort of chest wall 10/26/2013  . Elevated CK   . Equinus deformity of foot, acquired 12/22/2013  . Failure to thrive (0-17) 12/20/2016  . Failure to thrive in adult   . Hyperkalemia   . Hyperlipidemia   . Hyperphosphatemia   . Left leg pain 10/29/2011   Et: ?diabetic peripheral neuropathy   Reported improvement with gabapentin in the past    . Low TSH level 06/13/2014  . Lupus   . Metatarsalgia of both feet 12/22/2013  . Mild CAD 2009  . Onychomycosis 12/22/2013  . Pain in joint, ankle and foot 04/01/2014  . Pain in lower limb 12/22/2013  . SLE 12/18/2006   Diagnosed 1993. Presented with abdominal pain and rash. Took prednisone in the past but no medications now in remission.    . Urinary tract infection with hematuria     PAST SURGICAL HISTORY: Past Surgical History:  Procedure Laterality Date  . IR GASTROSTOMY TUBE REMOVAL  08/26/2017  . IR GENERIC HISTORICAL  12/25/2016   IR GASTROSTOMY TUBE MOD SED 12/25/2016 Sandi Mariscal, MD [facility]-INTERV RAD    FAMILY HISTORY: Family History  Problem Relation Age of Onset  . Cirrhosis Brother   . Alcohol abuse Brother   . Stroke Maternal Grandmother   . Cancer Brother        throat cancer (smoker)    SOCIAL HISTORY: Social History   Socioeconomic History  . Marital status: Single    Spouse name: Not on file  . Number of children: Not on file  . Years of education: Not on file  . Highest education level: Not on file  Social Needs  . Financial resource strain: Not on file  . Food insecurity - worry: Not on file  . Food insecurity - inability: Not on file    . Transportation needs - medical: Not on file  . Transportation needs - non-medical: Not on file  Occupational History  . Not on file  Tobacco Use  . Smoking status: Former Smoker    Last attempt to quit: 01/23/2001    Years since quitting: 16.6  . Smokeless tobacco: Former Network engineer and Sexual Activity  . Alcohol use: No    Alcohol/week: 0.6 oz    Types: 1 Cans of beer per week  . Drug use: No  . Sexual activity: Not Currently    Birth control/protection: None  Other Topics Concern  . Not on file  Social History Narrative   Living at [facility] at this time for therapy.  Vilinda Flake, [place].       PHYSICAL EXAM  There were no vitals filed for this visit. There is no height or weight on file to calculate BMI.  Generalized: Well developed, in no acute  distress  Head: normocephalic and atraumatic,. Oropharynx benign  Neck: Supple, no carotid bruits  Cardiac: Regular rate rhythm, no murmur  Musculoskeletal: No deformity   Neurological examination   Mentation: Alert , mute.  Follows all commands . He holds up the correct number of fingers when asked to perform task   Cranial nerve II-XII: Pupils were equal round reactive to light extraocular movements were full, visual field were full on confrontational test. Facial sensation and strength were normal. hearing was intact to finger rubbing bilaterally. Unable to visualize palate  head turning and shoulder shrug were normal and symmetric.Tongue is midline Motor: normal bulk and tone, full strength in the BUE, 4/5 both lower extremities Sensory: normal and symmetric to light touch, pinprick, and  Vibration in the upper and lower extremities  Coordination: finger-nose-finger, no dysmetria Reflexes: Symmetric upper and lower plantar responses were flexor bilaterally. Gait and Station: In wheelchair not ambulated  DIAGNOSTIC DATA (LABS, IMAGING, TESTING) - I reviewed patient records, labs, notes, testing and imaging  myself where available.  Lab Results  Component Value Date   WBC 7.2 12/31/2016   HGB 12.2 (A) 05/01/2017   HCT 37 (A) 05/01/2017   MCV 92.5 12/31/2016   PLT 261 12/31/2016      Component Value Date/Time   NA 133 (A) 05/27/2017   NA 132 (A) 05/14/2017   K 3.9 05/27/2017   K 4.5 05/14/2017   CL 95 (A) 05/27/2017   CL 94 (A) 05/14/2017   CO2 25 05/27/2017   GLUCOSE 136 (H) 12/31/2016 1352   BUN 23 (A) 05/27/2017   BUN 41.5 (A) 02/19/2017   CREATININE 0.89 05/27/2017   CREATININE 1.17 05/09/2017   CALCIUM 9.1 05/27/2017   CALCIUM 9.0 05/14/2017   PROT 7.1 02/19/2017   ALBUMIN 3.8 02/19/2017   AST 32 02/19/2017   ALT 79 (A) 02/19/2017   ALKPHOS 80 02/19/2017   BILITOT 0.3 02/19/2017   GFRNONAA 88.77 05/27/2017   GFRNONAA 73 01/26/2016 0958   GFRAA >90 05/27/2017   GFRAA 85 01/26/2016 0958   Lab Results  Component Value Date   CHOL 129 02/19/2017   HDL 42 11/14/2016   LDLCALC 76 02/19/2017   LDLDIRECT 71 10/07/2012   TRIG 108 02/19/2017   CHOLHDL 4.1 02/19/2017   Lab Results  Component Value Date   HGBA1C 6.6 05/02/2017   Lab Results  Component Value Date   VITAMINB12 247 12/14/2016   Lab Results  Component Value Date   TSH 3.699 12/14/2016     ASSESSMENT AND PLAN  65 y.o. year old male  has a past medical history of CHF (congestive heart failure) ([place]); Diabetes mellitus; Hyperlipidemia; Lupus; and Mild CAD (2009). here For hospital follow-up for pontine stroke 2. Patient is currently in a nursing facility obtaining  rehabilitation services. He also has a feeding tube due to failure to thrive  Stressed the importance of management of risk factors to prevent further stroke Continue aspirin and Plavix for secondary stroke prevention Maintain strict control of hypertension with blood pressure goal below 130/90, today's reading 115/71 continue antihypertensive medications Control of diabetes with hemoglobin A1c below 6.5 followed by primary care most recent  hemoglobin A1c 8.1 continue diabetic medications Cholesterol with LDL cholesterol less than 70, followed by primary care,  most recent178 continue statin drugs Intensive therapies physical therapy and speech therapy  F/U in 6 months Discussed risk for recurrent stroke/ TIA and answered additional questions for cousin  This was a visit requiring 30 minutes and  medical decision making of high complexity with extensive review of history, hospital chart, counseling and answering questions Dennie Bible, [facility], [facility], APRN  [facility] [address] [place], [place] [address] [phone]

## 2017-08-28 ENCOUNTER — Ambulatory Visit: Payer: PPO | Admitting: Nurse Practitioner

## 2017-08-29 ENCOUNTER — Encounter: Payer: Self-pay | Admitting: Nurse Practitioner

## 2017-09-01 ENCOUNTER — Ambulatory Visit: Payer: PPO | Admitting: Nurse Practitioner

## 2017-09-10 ENCOUNTER — Other Ambulatory Visit: Payer: Self-pay | Admitting: Family Medicine

## 2017-09-10 DIAGNOSIS — I63012 Cerebral infarction due to thrombosis of left vertebral artery: Secondary | ICD-10-CM

## 2017-09-10 DIAGNOSIS — I6930 Unspecified sequelae of cerebral infarction: Secondary | ICD-10-CM

## 2017-09-22 LAB — HEMOGLOBIN A1C: Hemoglobin A1C: 5.5

## 2017-09-24 ENCOUNTER — Encounter: Payer: Self-pay | Admitting: Family Medicine

## 2017-09-24 ENCOUNTER — Non-Acute Institutional Stay (INDEPENDENT_AMBULATORY_CARE_PROVIDER_SITE_OTHER): Payer: PPO | Admitting: Family Medicine

## 2017-09-24 DIAGNOSIS — H409 Unspecified glaucoma: Secondary | ICD-10-CM

## 2017-09-24 DIAGNOSIS — E1140 Type 2 diabetes mellitus with diabetic neuropathy, unspecified: Secondary | ICD-10-CM

## 2017-09-24 DIAGNOSIS — I63012 Cerebral infarction due to thrombosis of left vertebral artery: Secondary | ICD-10-CM

## 2017-09-24 DIAGNOSIS — I420 Dilated cardiomyopathy: Secondary | ICD-10-CM

## 2017-09-24 DIAGNOSIS — F1010 Alcohol abuse, uncomplicated: Secondary | ICD-10-CM

## 2017-09-24 DIAGNOSIS — Z931 Gastrostomy status: Secondary | ICD-10-CM

## 2017-09-24 NOTE — Assessment & Plan Note (Signed)
Stable: MRI with acute nonhemorrhagic pontine infarct resulting in dysarthria. Will be following up with Cardiology 10/10/17 and Neurology 11/11/16. PEG tub removed in 11/18 now on chopped/thin diet and tolerating this well with appropriate volume status and weight gain. Following up with SLP 5x/week for 6 weeks.  - ContinueClopidogrel 75mg .  - Continue to work with Physical Therapy, and Occupational Therapy - Monitor mental status. High risk of Vascular Dementia.  - follow up with neurology 11/11/16 and neuropsych 12/18/16

## 2017-09-24 NOTE — Assessment & Plan Note (Signed)
Stable: Removed 11/18. Tolerating PO appropriately with good weight gain. SLP 5x/weekly for 6 weeks and taking thin/chopped diet.  - Cont with SLP - Monitor Electrolytes PRN - monitor weight and volume status

## 2017-09-24 NOTE — Assessment & Plan Note (Signed)
Stable:  - Last drink 12/11/16

## 2017-09-24 NOTE — Progress Notes (Signed)
[place]  Visit  Primary Care Provider: Dr. Sherren Mocha McDiarmid Location of Care: [facility] Visit Information: a scheduled routine follow-up visit Patient accompanied by patient Source(s) of information for visit: patient and nursing home  Chief Complaint: No chief complaint on file.   Nursing Concerns: None, other than listed below  Behavioral Concerns: concerns about depression. (Due to below).  Nutrition Concerns: PEG removed 11/3. Weight increased from 174 to 186 between 11/15-12/3. Good PO intake.   Wound Care Nurse Concerns: none  PT Concerns and Goals: Concerns none;  improved activity tolerance, improved balance and ability to compensate for deficits;  Goals Partially Met  Physical Restraint: No    If SNF admission, patient's goal for the rehabilitation admission:  Goals identified by the patient:;  increase overall strength and endurance and functional communication with caregivers     Family Goals: refusing contact with family   If SNF admission, discharge disposition goals:  Lives alone without family in area. Wants to go home.    HISTORY OF PRESENT ILLNESS: Outpatient Encounter Medications as of 09/24/2017  Medication Sig  . Amino Acids-Protein Hydrolys (FEEDING SUPPLEMENT, PRO-STAT SUGAR FREE 64,) LIQD Place 30 mLs into feeding tube daily.  Marland Kitchen atorvastatin (LIPITOR) 80 MG tablet Place 1 tablet (80 mg total) into feeding tube at bedtime.  . carvedilol (COREG) 3.125 MG tablet Place 1 tablet (3.125 mg total) into feeding tube 2 (two) times daily with a meal.  . clopidogrel (PLAVIX) 75 MG tablet Place 1 tablet (75 mg total) into feeding tube daily.  . folic acid (FOLVITE) 1 MG tablet Place 1 tablet (1 mg total) into feeding tube daily.  Marland Kitchen gabapentin (NEURONTIN) 600 MG tablet Take 600 mg by mouth at bedtime.  Marland Kitchen losartan (COZAAR) 25 MG tablet Take 25 mg by mouth daily.  . metFORMIN (GLUCOPHAGE) 850 MG tablet Take 850 mg by mouth 2 (two) times daily  with a meal.  . mirtazapine (REMERON) 15 MG tablet Place 15 mg into feeding tube at bedtime.  . nitroGLYCERIN (NITROSTAT) 0.4 MG SL tablet Place 1 tablet (0.4 mg total) under the tongue every 5 (five) minutes as needed for chest pain.  . Nutritional Supplements (FEEDING SUPPLEMENT, NEPRO CARB STEADY,) LIQD Take 237 mLs by mouth 4 (four) times daily.  . promethazine (PHENERGAN) 25 MG/ML injection Inject 25 mg into the muscle as needed for nausea or vomiting.  . travoprost, benzalkonium, (TRAVATAN) 0.004 % ophthalmic solution Place 1 drop into both eyes at bedtime.   . Water For Irrigation, Sterile (FREE WATER) SOLN Place 250 mLs into feeding tube 4 (four) times daily. (Patient taking differently: Place 250 mLs into feeding tube every 4 (four) hours. )   No facility-administered encounter medications on file as of 09/24/2017.    No Known Allergies History Patient Active Problem List   Diagnosis Date Noted  . S/P percutaneous endoscopic gastrostomy (PEG) tube placement ([place]) 03/14/2017  . Living in nursing home 01/02/2017  . Late effect of cerebrovascular accident (CVA)   . Oropharyngeal dysphagia   . Aphasia 12/16/2016  . CVA (cerebral vascular accident) ([place])   . Chronic systolic heart failure ([place]) 04/01/2014  . Diabetic neuropathy, painful ([place]) 12/22/2013  . GOUT, UNSPECIFIED 06/26/2010  . Type 2 diabetes mellitus with diabetic polyneuropathy, without long-term current use of insulin ([place]) 06/29/2009  . Unspecified glaucoma 04/17/2009  . Hyperlipidemia due to type 2 diabetes mellitus ([place]) 03/02/2009  . Coronary atherosclerosis 03/02/2009  . OBESITY 01/17/2009  . Alcohol abuse 01/09/2009  .  Congestive dilated cardiomyopathy ([place]) 01/09/2009  . HYPERTENSION, BENIGN SYSTEMIC 12/18/2006   Past Medical History:  Diagnosis Date  . Abnormal EKG 12/16/2016   Inferior and anteroseptal Q wave infarction pattern  . AKI (acute kidney injury) ([place]) 12/20/2016  . CHF (congestive heart failure)  ([place])   . Dehydration   . Diabetes mellitus   . Discomfort of chest wall 10/26/2013  . Elevated CK   . Equinus deformity of foot, acquired 12/22/2013  . Failure to thrive (0-17) 12/20/2016  . Failure to thrive in adult   . Hyperkalemia   . Hyperlipidemia   . Hyperphosphatemia   . Left leg pain 10/29/2011   Et: ?diabetic peripheral neuropathy   Reported improvement with gabapentin in the past    . Low TSH level 06/13/2014  . Lupus   . Metatarsalgia of both feet 12/22/2013  . Mild CAD 2009  . Onychomycosis 12/22/2013  . Pain in joint, ankle and foot 04/01/2014  . Pain in lower limb 12/22/2013  . SLE 12/18/2006   Diagnosed 1993. Presented with abdominal pain and rash. Took prednisone in the past but no medications now in remission.    . Urinary tract infection with hematuria    Past Surgical History:  Procedure Laterality Date  . IR GASTROSTOMY TUBE REMOVAL  08/26/2017  . IR GENERIC HISTORICAL  12/25/2016   IR GASTROSTOMY TUBE MOD SED 12/25/2016 Sandi Mariscal, MD [facility]-INTERV RAD  . TEE WITHOUT CARDIOVERSION N/A 12/17/2016   Procedure: TRANSESOPHAGEAL ECHOCARDIOGRAM (TEE);  Surgeon: Skeet Latch, MD;  Location: [facility] ENDOSCOPY;  Service: Cardiovascular;  Laterality: N/A;   Family History  Problem Relation Age of Onset  . Cirrhosis Brother   . Alcohol abuse Brother   . Stroke Maternal Grandmother   . Cancer Brother        throat cancer (smoker)    reports that he quit smoking about 16 years ago. He has quit using smokeless tobacco. He reports that he does not drink alcohol or use drugs.  Basic Activities of Daily Living   ADLs Independent Needs Assistance Dependent  Bathing   X  Dressing   X  Ambulation  X   Toileting   X (Incontinent)  Eating   X      Instrumental Activities of Daily Living  IADL Independent Needs Assistance Dependent  Cooking   X  Housework   X  Manage Medications  X   Manage the telephone X    Shopping for food, clothes, Meds, etc   X  Use transportation   X  Manage  Finances X      Falls in the past six months:   yes  Diet:  Chopped/thin diet Feeding Tube: no  Hydration Status: well hydrated  Communication Barriers: Dysarthric  Review of Systems  Patient has ability to communicate answers to ROS: yes See HPI General: Denies fevers, chills, progressive fatigue, weight gain.  Eyes: Denies pain, blurred vision  Ears/Nose/Throat: Denies ear pain, throat pain, rhinorrhea, nasal congestion.  Cardiovascular: Denies chest pains, palpitations, dyspnea on exertion, orthopnea, peripheral edema.  Respiratory: Denies cough, sputum, dyspnea  Gastrointestinal: Denies abdominal pain, bloating, constipation, diarrhea.  Genitourinary: Denies dysuria, urinary frequency, discharge Musculoskeletal: Denies joint pain, swelling, weakness.  Skin: Denies skin rash or ulcers. Neurologic: Denies transient paralysis, weakness, paresthesias, headache.  Psychiatric: Denies depression, anxiety, psychosis. Endocrine: Denies weight loss    Geriatric Syndromes: Constipation no ,   Incontinence yes  Dizziness no   Syncope no   Skin problems no  Visual Impairment no   Hearing impairment no  Eating impairment no Impaired Memory or Cognition yes   Behavioral problems no   Sleep problems no   Weight loss none  Mood: constricted   Psychotropic Medication Use:  Sedative-hypnotics / Anxiolytics: No  Antipsychotics: Yes Depakote Antidepressants: No    PHYSICAL EXAM:. Wt Readings from Last 3 Encounters:  09/22/17 186 lb (84.4 kg)  03/21/17 182 lb 12.8 oz (82.9 kg)  02/27/17 185 lb (83.9 kg)   Temp Readings from Last 3 Encounters:  01/02/17 (!) 96.8 F (36 C) (Oral)  12/31/16 98.3 F (36.8 C) (Oral)  12/20/16 98 F (36.7 C)   BP Readings from Last 3 Encounters:  09/12/17 127/76  05/21/17 116/64  05/16/17 136/88   Pulse Readings from Last 3 Encounters:  09/12/17 84  05/21/17 76  05/16/17 88    General: alert, cooperative, no distress, well  nourished, pleasant, clean, groomed HEENT:  No scleral icterus, no nasal secretions, [place] moist and no erythema or lesion Neck:  Supple, no lymphadenopathy CV:  RRR, no murmur, no ankle swelling RESP: No resp distress or accessory muscle use.  Clear to ausc bilat. No wheezing, no rales, no rhonchi.  ABD:  Soft, Non-tender, non-distended, +bowel sounds, no masses MSK:  No back pain, no joint pain.  No joint swelling or redness. Does have limited ROM in right shoulder. EXT: Warm and well perfused, No cyanosis and No edema    Gait:  Non ambulatory Skin: No rashes or skin ulcers noted Neurologic: PERRLA. CN intact. Muscle strength 5/5 in upper extremities bilaterally. 4/5 strength in hip flexion noted bilaterally. Sensation intact. Able to speak in short sentences.   MMSE or MoCa:  18 / 30 (on 01/01/17) Years of Education: 12 +   Renal/Electrolytes (last) BMP Latest Ref Rng & Units 05/27/2017 05/20/2017 05/14/2017  Glucose 65 - 99 mg/dL - - -  BUN 4 - 21 23(A) 41(A) 71(A)  Creatinine 0.50 - 1.10 0.89 1.3 -  BUN/Creat Ratio 6.0 - 25.0 26.3(A) - 30.8(A)  Sodium 137 - 147 133(A) 140 132(A)  Potassium 3.4 - 5.3 3.9 4.3 4.5  Chloride 99 - 108 95(A) - 94(A)  CO2 22 - 31 25 - 21(A)  Calcium 8.7 - 10.7 9.1 - 9.0  , _0 @ Lab Results  Component Value Date   CALCIUM 9.1 05/27/2017   PHOS 4 01/01/2017   LFTs (last) Lab Results  Component Value Date   ALT 79 (A) 02/19/2017   AST 32 02/19/2017   ALKPHOS 80 02/19/2017   BILITOT 0.3 02/19/2017   Albumin & Total Protein Lab Results  Component Value Date   LABPROT 14.0 12/24/2016   Lipase (last) No results found for: LIPASE Amylase (last) No results found for: AMYLASE CBC (last) CBC Latest Ref Rng & Units 05/01/2017 12/31/2016 12/26/2016  WBC 4.0 - 10.5 K/uL - 7.2 5.6  Hemoglobin 13.5 - 17.5 12.2(A) 15.2 13.2  Hematocrit 41 - 53 37(A) 45.9 39.1  Platelets 150 - 400 K/uL - 261 213   Lipids (last) Lab Results  Component  Value Date   CHOL 129 02/19/2017   HDL 42 11/14/2016   LDLCALC 76 02/19/2017   LDLDIRECT 71 10/07/2012   TRIG 108 02/19/2017   CHOLHDL 4.1 02/19/2017   Cardiac Enzymes  Lab Results  Component Value Date   CKTOTAL 82 12/20/2016   CKMB 6.8 (H) 06/25/2007   TROPONINI (H) 06/25/2007    0.13        PERSISTENTLY INCREASED TROPONIN  VALUES IN THE RANGE OF 0.06-0.49 ng/mL CAN BE SEEN IN:       -UNSTABLE ANGINA   BNP (last 3 results)  No results for input(s): BNP in the last 8760 hours. ProBNP (last 3 results)  No results for input(s): PROBNP in the last 8760 hours. CBG (last 3)  No results for input(s): GLUCAP in the last 72 hours. A1c (last) Lab Results  Component Value Date   HGBA1C 5.5 09/22/2017   Imaging    Health Maintenance due or soon due Diabetes Health Maintenance Due  Topic Date Due  . OPHTHALMOLOGY EXAM  11/30/2016  . HEMOGLOBIN A1C  11/02/2017  . FOOT EXAM  12/06/2017     Assessment and Plan:   See Problem List for individual problem's assessment and plans.   Congestive dilated cardiomyopathy Stable: Echo in 11/2016 with EF 45-50% and diffuse hypokinesis. Entresto discontinued 05/21/17 and IMDUR/hydralazine initiated. Not fluid overloaded and lungs clear.  - ContinueCoreg 3.178m twice daily - continue Imdur 30 mg daily and hydralazine 10 mg 3 times a day. - Consider diuretics if symptomatic - Monitor BP/HR - follow up with Cardiology 10/10/17  CVA (cerebral vascular accident) ([place] Stable: MRI with acute nonhemorrhagic pontine infarct resulting in dysarthria. Will be following up with Cardiology 10/10/17 and Neurology 11/11/16. PEG tub removed in 11/18 now on chopped/thin diet and tolerating this well with appropriate volume status and weight gain. Following up with SLP 5x/week for 6 weeks.  - ContinueClopidogrel 7108m  - Continue to work with Physical Therapy, and Occupational Therapy - Monitor mental status. High risk of Vascular Dementia.  - follow  up with neurology 11/11/16 and neuropsych 12/18/16  S/P percutaneous endoscopic gastrostomy (PEG) tube placement ([place]: Removed 11/18. Tolerating PO appropriately with good weight gain. SLP 5x/weekly for 6 weeks and taking thin/chopped diet.  - Cont with SLP - Monitor Electrolytes PRN - monitor weight and volume status  Alcohol abuse Stable:  - Last drink 12/11/16  Diabetic neuropathy, painful (HCC) Stable/Improving: A1C 09/22/17 was 5.5. Currently on 85090mID metformin. Could consider decreasing to 500m70mD.  - Continue Gabapentin - QD CBGs - repeat A1C 3/19  Unspecified glaucoma Stable: Denies changes in vision. Due for ophthalmology exam. Placed order for referral.  - f/u with opthalmology - Continue Travaprost  Right shoulder pain: Could be new onset impingement 2/2 stroke.  - improved from last visit. No TTP. ROM still limited, but likely residual deficit 2/2 stroke.   Future labs/tests needed: Will get repeat A1C 3/18. Patient wants to go home. Will have neuropsychiatric evaluation to determine if patient could live at home alone. This appointment is 12/18/16. Will follow up.   Time spent > 20mi65m 50% of time with patient was spent reviewing records, labs, tests and studies, counseling, discussion with family, discussion with nursing facility staff, discussion with patient's referring providers, discussion with patient's specialists in developing plan of care  Family communications: Mr. FarriLevequeines contacting family. Has one distant cousin who he does not speak with, otherwise no family.  Code Status:    Full Intubation Status: If Needed Intravenous Fluids: If Needed Feeding Tubes: If Needed.  Antibiotics: If Needed Hospitalization: If Needed Emergency contact:  None Follow Up:  Next 60 days unless acute issues arise.     Daniel L. WardeRosalyn GessCoSummersdent PGY-2 09/24/2017 10:11 AM

## 2017-09-24 NOTE — Assessment & Plan Note (Signed)
Stable: Echo in 11/2016 with EF 45-50% and diffuse hypokinesis. Entresto discontinued 05/21/17 and IMDUR/hydralazine initiated. Not fluid overloaded and lungs clear.  - ContinueCoreg 3.125mg  twice daily - continue Imdur 30 mg daily and hydralazine 10 mg 3 times a day. - Consider diuretics if symptomatic - Monitor BP/HR - follow up with Cardiology 10/10/17

## 2017-09-24 NOTE — Assessment & Plan Note (Signed)
Stable: Denies changes in vision. Due for ophthalmology exam. Placed order for referral.  - f/u with opthalmology - Continue Travaprost

## 2017-09-24 NOTE — Assessment & Plan Note (Signed)
Stable/Improving: A1C 09/22/17 was 5.5. Currently on 850mg  BID metformin. Could consider decreasing to 500mg  BID.  - Continue Gabapentin - QD CBGs - repeat A1C 3/19

## 2017-09-25 ENCOUNTER — Encounter: Payer: PPO | Admitting: Psychology

## 2017-10-07 ENCOUNTER — Encounter: Payer: PPO | Admitting: Psychology

## 2017-10-09 NOTE — Progress Notes (Signed)
Cardiology Office Note   Date:  10/10/2017   ID:  Blake Wilson, DOB [birth year], MRN [id number]  PCP:  Eloise Levels, MD  Cardiologist:  Dr. Johnsie Cancel     Chief Complaint  Patient presents with  . Cardiomyopathy      History of Present Illness: Blake Wilson is a 66 y.o. male who presents for cardiomyopathy.   He has a hx of CVA in February 2018 -pontine with difficulty swallowing Went to [place] and FTT and readmitted for PEG tube placement. Stroke small vessel disease. TEE negative for SOE History of DCM no CAD by Cath 2009.  He does not speak but moves all extremities Was to have 30 day event monitor to r/o PAF. D/c on ASA and plavix per PEG tube after stroke   Reviewed 30 day monitor no PAF NSR average HR 85 bpm 01/29/17 TEE 12/17/16 reviewed:  EF 40-45% midl MR/AR no SOE  Today still at [facility] but would love to go home.  He can say a few words but not many.  He denied chest pain or SOB.  He has been doing well.  No palpitations.  His entresto was changed to imdur and hydralazine.  He is eating.      Past Medical History:  Diagnosis Date  . Abnormal EKG 12/16/2016   Inferior and anteroseptal Q wave infarction pattern  . AKI (acute kidney injury) ([place]) 12/20/2016  . CHF (congestive heart failure) ([place])   . Dehydration   . Diabetes mellitus   . Discomfort of chest wall 10/26/2013  . Elevated CK   . Equinus deformity of foot, acquired 12/22/2013  . Failure to thrive (0-17) 12/20/2016  . Failure to thrive in adult   . Hyperkalemia   . Hyperlipidemia   . Hyperphosphatemia   . Left leg pain 10/29/2011   Et: ?diabetic peripheral neuropathy   Reported improvement with gabapentin in the past    . Low TSH level 06/13/2014  . Lupus   . Metatarsalgia of both feet 12/22/2013  . Mild CAD 2009  . Onychomycosis 12/22/2013  . Pain in joint, ankle and foot 04/01/2014  . Pain in lower limb 12/22/2013  . SLE 12/18/2006   Diagnosed 1993. Presented with abdominal pain and rash. Took  prednisone in the past but no medications now in remission.    . Urinary tract infection with hematuria     Past Surgical History:  Procedure Laterality Date  . IR GASTROSTOMY TUBE REMOVAL  08/26/2017  . IR GENERIC HISTORICAL  12/25/2016   IR GASTROSTOMY TUBE MOD SED 12/25/2016 Sandi Mariscal, MD [facility]-INTERV RAD  . TEE WITHOUT CARDIOVERSION N/A 12/17/2016   Procedure: TRANSESOPHAGEAL ECHOCARDIOGRAM (TEE);  Surgeon: Skeet Latch, MD;  Location: [facility] ENDOSCOPY;  Service: Cardiovascular;  Laterality: N/A;     Current Outpatient Medications  Medication Sig Dispense Refill  . Amino Acids-Protein Hydrolys (FEEDING SUPPLEMENT, PRO-STAT SUGAR FREE 64,) LIQD Place 30 mLs into feeding tube daily. 900 mL 0  . atorvastatin (LIPITOR) 80 MG tablet Place 1 tablet (80 mg total) into feeding tube at bedtime. 30 tablet 12  . carvedilol (COREG) 3.125 MG tablet Place 1 tablet (3.125 mg total) into feeding tube 2 (two) times daily with a meal. 30 tablet 0  . clopidogrel (PLAVIX) 75 MG tablet Place 1 tablet (75 mg total) into feeding tube daily. 30 tablet 0  . folic acid (FOLVITE) 1 MG tablet Place 1 tablet (1 mg total) into feeding tube daily. 30 tablet 0  . gabapentin (  NEURONTIN) 600 MG tablet Take 600 mg by mouth at bedtime.    Marland Kitchen losartan (COZAAR) 25 MG tablet Take 25 mg by mouth daily.    . metFORMIN (GLUCOPHAGE) 850 MG tablet Take 850 mg by mouth 2 (two) times daily with a meal.    . mirtazapine (REMERON) 15 MG tablet Place 15 mg into feeding tube at bedtime.    . nitroGLYCERIN (NITROSTAT) 0.4 MG SL tablet Place 1 tablet (0.4 mg total) under the tongue every 5 (five) minutes as needed for chest pain. 20 tablet 3  . Nutritional Supplements (FEEDING SUPPLEMENT, NEPRO CARB STEADY,) LIQD Take 237 mLs by mouth 4 (four) times daily. 1000 mL 10  . promethazine (PHENERGAN) 25 MG/ML injection Inject 25 mg into the muscle as needed for nausea or vomiting.    . travoprost, benzalkonium, (TRAVATAN) 0.004 % ophthalmic solution  Place 1 drop into both eyes at bedtime.     . Water For Irrigation, Sterile (FREE WATER) SOLN Place 250 mLs into feeding tube 4 (four) times daily. (Patient taking differently: Place 250 mLs into feeding tube every 4 (four) hours. ) 1000 mL 10   No current facility-administered medications for this visit.     Allergies:   Patient has no known allergies.    Social History:  The patient  reports that he quit smoking about 16 years ago. He has quit using smokeless tobacco. He reports that he does not drink alcohol or use drugs.   Family History:  The patient's family history includes Alcohol abuse in his brother; Cancer in his brother; Cirrhosis in his brother; Stroke in his maternal grandmother.    ROS:  General:no colds or fevers, no weight changes Skin:no rashes or ulcers HEENT:no blurred vision, no congestion CV:see HPI PUL:see HPI GI:no diarrhea constipation or melena, no indigestion GU:no hematuria, no dysuria MS:no joint pain, no claudication Neuro:no syncope, no lightheadedness Endo:+ diabetes, no thyroid disease  Wt Readings from Last 3 Encounters:  09/22/17 186 lb (84.4 kg)  03/21/17 182 lb 12.8 oz (82.9 kg)  02/27/17 185 lb (83.9 kg)     PHYSICAL EXAM: VS:  BP 118/82   Pulse 70   Ht 6' (1.829 m)   SpO2 99%   BMI 25.23 kg/m  , BMI Body mass index is 25.23 kg/m. General:Pleasant affect, NAD Skin:Warm and dry, brisk capillary refill HEENT:normocephalic, sclera clear, mucus membranes moist Neck:supple, no JVD, no bruits  Heart:S1S2 RRR without murmur, gallup, rub or click Lungs:clear without rales, rhonchi, or wheezes XIP:JASN, non tender, + BS, do not palpate liver spleen or masses Ext:no lower ext edema, 1+ pedal pulses, 2+ radial pulses Neuro:alert and oriented X 3 by yes and no questions, MAE but upper ext weak, can straighten legs, follows commands   EKG:  EKG is NOT ordered today.    Recent Labs: 12/14/2016: TSH 3.699 12/31/2016: Platelets  261 01/01/2017: Magnesium 1.8 02/19/2017: ALT 79 05/01/2017: Hemoglobin 12.2 05/27/2017: BUN 23; Creatinine, Ser 0.89; Potassium 3.9; Sodium 133    Lipid Panel    Component Value Date/Time   CHOL 129 02/19/2017   TRIG 108 02/19/2017   HDL 42 11/14/2016 0920   CHOLHDL 4.1 02/19/2017   CHOLHDL 5.9 (H) 11/14/2016 0920   VLDL 22 02/19/2017   LDLCALC 76 02/19/2017   LDLDIRECT 71 10/07/2012 1059       Other studies Reviewed: Additional studies/ records that were reviewed today include: . TEE 12/17/16 ------------------------------------------------------------------- Study Conclusions  - Left ventricle: Systolic function was mildly to moderately  reduced. The estimated ejection fraction was in the range of 40%   to 45%. Diffuse hypokinesis. - Aortic valve: There was mild regurgitation. - Mitral valve: There was mild regurgitation. - Left atrium: No evidence of thrombus in the atrial cavity or   appendage. No evidence of thrombus in the atrial cavity or   appendage. - Right atrium: No evidence of thrombus in the atrial cavity or   appendage. - Atrial septum: No defect or patent foramen ovale was identified   by color flow Doppler or saline microcavitation study.  Echo 12/16/16 Study Conclusions  - Left ventricle: The cavity size was mildly dilated. Wall   thickness was normal. Systolic function was mildly reduced. The   estimated ejection fraction was in the range of 45% to 50%.   Diffuse hypokinesis. Doppler parameters are consistent with   abnormal left ventricular relaxation (grade 1 diastolic   dysfunction). - Left atrium: The atrium was mildly dilated. - Atrial septum: No defect or patent foramen ovale was identified.  ASSESSMENT AND PLAN:  1.  DCM NICM cath with normal Cors in 2009.  Euvolemic today. No SOB  Follow up with Dr. Johnsie Cancel in 6 months.  2.   CVA - speaks few words, moves ext but in wheel chair.  Still at [facility] and would like to go home.    3.  HTN  controlled continue current meds.    4.  DM per IM followed at [facility]  5.  HLD followed by PCP on statin   Current medicines are reviewed with the patient today.  The patient Has no concerns regarding medicines.  The following changes have been made:  See above Labs/ tests ordered today include:see above  Disposition:   FU:  see above  Signed, Cecilie Kicks, NP  10/10/2017 [facility], [place], [place], [place] Phone: [phone]; Fax: [phone]

## 2017-10-10 ENCOUNTER — Encounter: Payer: Self-pay | Admitting: Cardiology

## 2017-10-10 ENCOUNTER — Ambulatory Visit (INDEPENDENT_AMBULATORY_CARE_PROVIDER_SITE_OTHER): Payer: PPO | Admitting: Cardiology

## 2017-10-10 VITALS — BP 118/82 | HR 70 | Ht 72.0 in

## 2017-10-10 DIAGNOSIS — I10 Essential (primary) hypertension: Secondary | ICD-10-CM | POA: Diagnosis not present

## 2017-10-10 DIAGNOSIS — I428 Other cardiomyopathies: Secondary | ICD-10-CM

## 2017-10-10 DIAGNOSIS — I639 Cerebral infarction, unspecified: Secondary | ICD-10-CM

## 2017-10-10 DIAGNOSIS — E782 Mixed hyperlipidemia: Secondary | ICD-10-CM

## 2017-10-10 NOTE — Patient Instructions (Addendum)
Medication Instructions:  Your physician recommends that you continue on your current medications as directed. Please refer to the Current Medication list given to you today.   Labwork: None ordered  Testing/Procedures: None ordeerd  Follow-Up: Your physician wants you to follow-up in: [place] DR. Johnsie Cancel  You will receive a reminder letter in the mail two months in advance. If you don't receive a letter, please call our office to schedule the follow-up appointment.     Any Other Special Instructions Will Be Listed Below (If Applicable).     If you need a refill on your cardiac medications before your next appointment, please call your pharmacy.

## 2017-10-27 ENCOUNTER — Ambulatory Visit (INDEPENDENT_AMBULATORY_CARE_PROVIDER_SITE_OTHER): Payer: Medicaid Other | Admitting: Podiatry

## 2017-10-27 ENCOUNTER — Encounter: Payer: Self-pay | Admitting: Podiatry

## 2017-10-27 DIAGNOSIS — B351 Tinea unguium: Secondary | ICD-10-CM | POA: Diagnosis not present

## 2017-10-27 DIAGNOSIS — M79675 Pain in left toe(s): Secondary | ICD-10-CM

## 2017-10-27 DIAGNOSIS — M79674 Pain in right toe(s): Secondary | ICD-10-CM | POA: Diagnosis not present

## 2017-11-10 NOTE — Progress Notes (Signed)
[facility]  PATIENT: Blake Wilson DOB: [birth year]   REASON FOR VISIT: Hospital follow-up for stroke March 2018 HISTORY FROM: Patient and Vicente Serene his Wilson    HISTORY OF PRESENT ILLNESS:Blake Wilson an 67 y.o.malerecently discharged after an admission for pontine CVA affecting speech and swallowing. He had residual deficits of dysarthria, expressive aphasia, and right sided weakness. He was on secondary stroke prevention with ASA, Plavix, and Lipitor. He was readmitted from [facility] 3 weeks later for failure to thrive and severely decreased PO intake thought partially to be due to a further worsening in his swallowing function. AKI was noted on this admission, thought to be due to dehydration. His PMHxincludesCAD, multiple chronic ischemic infarctions, HFrEF, dilated cardiomyopathy, DM2, neuropathy and EtOH abuse.  A follow up MRI was obtained, revealing extension of the previously seen pontine ischemic infarction. CTA of the head and neck showed occluded left vertebral artery. Echocardiogram EF 45-50% no cardiac source of emboli. LDL 178 11 A1c 10. He returns to the stroke clinic today for follow-up. He currently has a feeding tube for medication administration and feedings. He is getting physical therapy and speech therapy. He has minimal speech today he is currently on aspirin and Plavix for secondary stroke prevention. He has no bruising and no bleeding. He is on Lipitor for hyperlipidemia. Repeat hemoglobin A1c on 02/06/2017  was 8.1. His Wilson states he was not very motivated initially to perform his therapies however that has changed in the last week or so. He wants to increase his overall strength and endurance and ability to communicate. according to the Wilson he returns for reevaluation UPDATE 1/22/2019CM Mr. Wilson, 67 year old male returns for follow-up with history of stroke event in March 2018.  He remains on Plavix for secondary  stroke prevention without further stroke or TIA symptoms.  He has minimal bruising and no bleeding.  He remains on Lipitor without complaints of myalgias.  Most recent hemoglobin A1c 5.4.  He was taken off of Glucophage his feeding tube was pulled and November and he is eating well.blood pressure in the office today 130/66 .  He remains in a skilled facility.  His therapies have concluded because he has refused further therapy.  He requires assistance for ADLs.  He returns for reevaluation  REVIEW OF SYSTEMS: Full 14 system review of systems performed and notable only for those listed, all others are neg:  Constitutional: neg  Cardiovascular: neg Ear/Nose/Throat: neg  Skin: neg Eyes: neg Respiratory: neg Gastroitestinal: neg  Hematology/Lymphatic: neg  Endocrine: neg Musculoskeletal walking difficulty Allergy/Immunology: neg Neurological: Speech difficulty  Psychiatric: Depression Sleep : neg   ALLERGIES: No Known Allergies  HOME MEDICATIONS: Outpatient Medications Prior to Visit  Medication Sig Dispense Refill  . atorvastatin (LIPITOR) 80 MG tablet Place 1 tablet (80 mg total) into feeding tube at bedtime. 30 tablet 12  . bisacodyl (DULCOLAX) 10 MG suppository Place 10 mg rectally daily as needed for moderate constipation.    . carvedilol (COREG) 3.125 MG tablet Place 1 tablet (3.125 mg total) into feeding tube 2 (two) times daily with a meal. 30 tablet 0  . clopidogrel (PLAVIX) 75 MG tablet Place 1 tablet (75 mg total) into feeding tube daily. 30 tablet 0  . gabapentin (NEURONTIN) 600 MG tablet Take 600 mg by mouth at bedtime.    Marland Kitchen losartan (COZAAR) 25 MG tablet Take 25 mg by mouth daily.    . magnesium hydroxide (MILK OF MAGNESIA) 400 MG/5ML suspension Take by mouth  daily as needed for mild constipation.    . mirtazapine (REMERON) 15 MG tablet Place 15 mg into feeding tube at bedtime.    . nitroGLYCERIN (NITROSTAT) 0.4 MG SL tablet Place 1 tablet (0.4 mg total) under the tongue  every 5 (five) minutes as needed for chest pain. 20 tablet 3  . promethazine (PHENERGAN) 25 MG/ML injection Inject 25 mg into the muscle as needed for nausea or vomiting.    . travoprost, benzalkonium, (TRAVATAN) 0.004 % ophthalmic solution Place 1 drop into both eyes at bedtime.     . folic acid (FOLVITE) 1 MG tablet Place 1 tablet (1 mg total) into feeding tube daily. (Patient not taking: Reported on 11/11/2017) 30 tablet 0  . Amino Acids-Protein Hydrolys (FEEDING SUPPLEMENT, PRO-STAT SUGAR FREE 64,) LIQD Place 30 mLs into feeding tube daily. (Patient not taking: Reported on 11/11/2017) 900 mL 0  . metFORMIN (GLUCOPHAGE) 850 MG tablet Take 850 mg by mouth 2 (two) times daily with a meal.    . Nutritional Supplements (FEEDING SUPPLEMENT, NEPRO CARB STEADY,) LIQD Take 237 mLs by mouth 4 (four) times daily. (Patient not taking: Reported on 11/11/2017) 1000 mL 10  . Water For Irrigation, Sterile (FREE WATER) SOLN Place 250 mLs into feeding tube 4 (four) times daily. (Patient not taking: Reported on 11/11/2017) 1000 mL 10   No facility-administered medications prior to visit.     PAST MEDICAL HISTORY: Past Medical History:  Diagnosis Date  . Abnormal EKG 12/16/2016   Inferior and anteroseptal Q wave infarction pattern  . AKI (acute kidney injury) ([place]) 12/20/2016  . CHF (congestive heart failure) ([facility] [place])   . Dehydration   . Diabetes mellitus   . Discomfort of chest wall 10/26/2013  . Elevated CK   . Equinus deformity of foot, acquired 12/22/2013  . Failure to thrive (0-17) 12/20/2016  . Failure to thrive in adult   . Hyperkalemia   . Hyperlipidemia   . Hyperphosphatemia   . Left leg pain 10/29/2011   Et: ?diabetic peripheral neuropathy   Reported improvement with gabapentin in the past    . Low TSH level 06/13/2014  . Lupus   . Metatarsalgia of both feet 12/22/2013  . Mild CAD 2009  . Onychomycosis 12/22/2013  . Pain in joint, ankle and foot 04/01/2014  . Pain in lower limb 12/22/2013  . SLE 12/18/2006    Diagnosed 1993. Presented with abdominal pain and rash. Took prednisone in the past but no medications now in remission.    . Urinary tract infection with hematuria     PAST SURGICAL HISTORY: Past Surgical History:  Procedure Laterality Date  . IR GASTROSTOMY TUBE REMOVAL  08/26/2017  . IR GENERIC HISTORICAL  12/25/2016   IR GASTROSTOMY TUBE MOD SED 12/25/2016 Sandi Mariscal, MD [facility]-INTERV RAD  . TEE WITHOUT CARDIOVERSION N/A 12/17/2016   Procedure: TRANSESOPHAGEAL ECHOCARDIOGRAM (TEE);  Surgeon: Skeet Latch, MD;  Location: [facility] ENDOSCOPY;  Service: Cardiovascular;  Laterality: N/A;    FAMILY HISTORY: Family History  Problem Relation Age of Onset  . Cirrhosis Brother   . Alcohol abuse Brother   . Stroke Maternal Grandmother   . Cancer Brother        throat cancer (smoker)    SOCIAL HISTORY: Social History   Socioeconomic History  . Marital status: Single    Spouse name: Not on file  . Number of children: Not on file  . Years of education: Not on file  . Highest education level: Not on file  Social Needs  .  Financial resource strain: Not on file  . Food insecurity - worry: Not on file  . Food insecurity - inability: Not on file  . Transportation needs - medical: Not on file  . Transportation needs - non-medical: Not on file  Occupational History  . Not on file  Tobacco Use  . Smoking status: Former Smoker    Last attempt to quit: 01/23/2001    Years since quitting: 16.8  . Smokeless tobacco: Former Network engineer and Sexual Activity  . Alcohol use: No    Alcohol/week: 0.6 oz    Types: 1 Cans of beer per week  . Drug use: No  . Sexual activity: Not Currently    Birth control/protection: None  Other Topics Concern  . Not on file  Social History Narrative   Living at [facility] at this time for therapy.  Vilinda Flake, [place].       PHYSICAL EXAM  Vitals:   11/11/17 0928  BP: 130/66  Pulse: 67  Weight: 174 lb (78.9 kg)   Body mass index is 23.6  kg/m.  Generalized: Well developed, in no acute distress  Head: normocephalic and atraumatic,. Oropharynx benign  Neck: Supple, no carotid bruits  Cardiac: Regular rate rhythm, no murmur  Musculoskeletal: No deformity   Neurological examination   Mentation: Alert , he continues to have speech difficulty, says a few words.  Follows all commands . He holds up the correct number of fingers when asked to perform task   Cranial nerve II-XII: Pupils were equal round reactive to light extraocular movements were full, visual field were full on confrontational test. Facial sensation and strength were normal. hearing was intact to finger rubbing bilaterally. Unable to visualize palate  head turning and shoulder shrug were normal and symmetric.Tongue is midline Motor: normal bulk and tone, full strength in the BUE, 4/5 both lower extremities Sensory: normal and symmetric to light touch, pinprick, and  Vibration in the upper and lower extremities  Coordination: finger-nose-finger, no dysmetria, no tremor Reflexes: Symmetric upper and lower plantar responses were flexor bilaterally. Gait and Station: In wheelchair not ambulated  DIAGNOSTIC DATA (LABS, IMAGING, TESTING) - I reviewed patient records, labs, notes, testing and imaging myself where available.  Lab Results  Component Value Date   WBC 7.2 12/31/2016   HGB 12.2 (A) 05/01/2017   HCT 37 (A) 05/01/2017   MCV 92.5 12/31/2016   PLT 261 12/31/2016      Component Value Date/Time   NA 133 (A) 05/27/2017   NA 132 (A) 05/14/2017   K 3.9 05/27/2017   K 4.5 05/14/2017   CL 95 (A) 05/27/2017   CL 94 (A) 05/14/2017   CO2 25 05/27/2017   GLUCOSE 136 (H) 12/31/2016 1352   BUN 23 (A) 05/27/2017   BUN 41.5 (A) 02/19/2017   CREATININE 0.89 05/27/2017   CREATININE 1.17 05/09/2017   CALCIUM 9.1 05/27/2017   CALCIUM 9.0 05/14/2017   PROT 7.1 02/19/2017   ALBUMIN 3.8 02/19/2017   AST 32 02/19/2017   ALT 79 (A) 02/19/2017   ALKPHOS 80  02/19/2017   BILITOT 0.3 02/19/2017   GFRNONAA 88.77 05/27/2017   GFRNONAA 73 01/26/2016 0958   GFRAA >90 05/27/2017   GFRAA 85 01/26/2016 0958   Lab Results  Component Value Date   CHOL 129 02/19/2017   HDL 42 11/14/2016   LDLCALC 76 02/19/2017   LDLDIRECT 71 10/07/2012   TRIG 108 02/19/2017   CHOLHDL 4.1 02/19/2017   Lab Results  Component Value  Date   HGBA1C 5.5 09/22/2017   Lab Results  Component Value Date   VITAMINB12 247 12/14/2016   Lab Results  Component Value Date   TSH 3.699 12/14/2016     ASSESSMENT AND PLAN  67 y.o. year old male  has a past medical history of CHF (congestive heart failure) ([place]); Diabetes mellitus; Hyperlipidemia; Lupus; and Mild CAD (2009). here For hospital follow-up for pontine stroke 2. Patient is currently in a nursing facility .  rehabilitation services have concluded. The patient is a current patient of Dr. Leonie Man  who is out of the office today . This note is sent to the work in doctor.     Stressed the importance of management of risk factors to prevent further stroke Continue Plavix for secondary stroke prevention Maintain strict control of hypertension with blood pressure goal below 130/90, today's reading 130/66 continue antihypertensive medications Control of diabetes with hemoglobin A1c below 6.5 followed by primary care most recent hemoglobin A1c 5.4 Cholesterol with LDL cholesterol less than 70, followed by primary care,   continue Lipitor Be careful with ambulation at risk for falls Discharge from stroke clinic I spent 25 minutes in total face to face time with the patient/cg more than 50% of which was spent counseling and coordination of care, reviewing test results reviewing medications and discussing and reviewing the diagnosis of stroke and management of risk factors Dennie Bible, [facility], [facility], APRN  [facility] [address], [place], [place] [address] [phone]

## 2017-11-11 ENCOUNTER — Ambulatory Visit: Payer: PPO | Admitting: Nurse Practitioner

## 2017-11-11 ENCOUNTER — Encounter: Payer: Self-pay | Admitting: Nurse Practitioner

## 2017-11-11 VITALS — BP 130/66 | HR 67 | Wt 174.0 lb

## 2017-11-11 DIAGNOSIS — I10 Essential (primary) hypertension: Secondary | ICD-10-CM

## 2017-11-11 DIAGNOSIS — I63012 Cerebral infarction due to thrombosis of left vertebral artery: Secondary | ICD-10-CM

## 2017-11-11 DIAGNOSIS — I6930 Unspecified sequelae of cerebral infarction: Secondary | ICD-10-CM | POA: Diagnosis not present

## 2017-11-11 DIAGNOSIS — E785 Hyperlipidemia, unspecified: Secondary | ICD-10-CM

## 2017-11-11 NOTE — Patient Instructions (Signed)
Stressed the importance of management of risk factors to prevent further stroke Continue Plavix for secondary stroke prevention Maintain strict control of hypertension with blood pressure goal below 130/90, today's reading 130/66 continue antihypertensive medications Control of diabetes with hemoglobin A1c below 6.5 followed by primary care most recent hemoglobin A1c 5.4 Cholesterol with LDL cholesterol less than 70, followed by primary care,   continue Lipitor Be careful with ambulation at risk for falls Discharge from stroke clinic

## 2017-12-18 ENCOUNTER — Ambulatory Visit (INDEPENDENT_AMBULATORY_CARE_PROVIDER_SITE_OTHER): Payer: PPO | Admitting: Psychology

## 2017-12-18 ENCOUNTER — Encounter: Payer: Self-pay | Admitting: Psychology

## 2017-12-18 DIAGNOSIS — I63012 Cerebral infarction due to thrombosis of left vertebral artery: Secondary | ICD-10-CM

## 2017-12-18 DIAGNOSIS — F0150 Vascular dementia without behavioral disturbance: Secondary | ICD-10-CM

## 2017-12-18 DIAGNOSIS — R4189 Other symptoms and signs involving cognitive functions and awareness: Secondary | ICD-10-CM

## 2017-12-18 DIAGNOSIS — I6930 Unspecified sequelae of cerebral infarction: Secondary | ICD-10-CM

## 2017-12-18 NOTE — Progress Notes (Signed)
NEUROBEHAVIORAL STATUS EXAM   Name: Blake Wilson Date of Birth: March 23, 1951 Date of Interview: 12/18/2017  Reason for Referral:  Blake Wilson is a 67 y.o. male who is referred for neuropsychological evaluation by Dr. Sherren Mocha McDiarmid of [facility] due to concerns about cognitive changes status-post CVA. This patient is accompanied in the office by the secretary of his SNF. He is interviewed alone with no collateral report to supplement the history. Medical records in [place] were reviewed.  History of Presenting Problem:  Record Review: According to the patient's medical records, he was hospitalized 12/13/2016-12/18/2016 after presenting to the ED with weakness/altered mental status. He was alert and oriented upon admission but had slowed speech, intermittent dysarthria, expressive aphasia and right sided hemiparesis. MRI brain showed acute ischemic nonhemorrhagic pontine infarct, no associated mass effect. There reportedly also were multiple chronic infarcts seen, and abnormal flow void within the distal left vertebral artery, compatible with slow flow and/or occlusion, suspected to be chronic in nature given the presence of multiple remote left cerebellar infarcts. Hospitalization was complicated by UTI. He was discharged to [facility].   He was re-admitted to the hospital 2 days later for worsening condition, inability to swallow, and had become nonverbal. MRI showed extension of bilateral pontine stroke from previous hospitalization. PEG tube was placed. He was discharged back to [facility] on 12/31/2016. MoCA score on that date was 18.   He apparently was started on Depakote by psychiatry in May 2018. No change in mood since starting the medication was noted so she was tapered off the medication in August 2018.  PEG tube was removed 11/18.  He was seen by Cecille Rubin, NP, at [facility] for stroke follow-up on 02/27/2017 and 11/11/2017  (discharged from stroke clinic on the latter date). Per Ms. Martin's notes, "His therapies [in the SNF] have concluded because he has refused further therapy.  He requires assistance for ADLs."   Most recent nursing home family medicine note is from 09/24/2017 and reports the patient is dependent for bathing, dressing, toileting (incontinent), and eating, and requires assistance with ambulation. It reports he is independent with managing the telephone and managing finances and needs assistance with managing medications. He is dependent for cooking, housework, shopping, and using transportation. It was noted that the "patient wants to go home. Will have neuropsychiatric evaluation to determine if patient could live at home alone." It was reported that the patient declines contacting family, has one distant cousin who he does not speak with, otherwise no family.  Patient interview/neurobehavioral status exam: At today's visit (12/18/2017), the patient is alert, oriented to place and some aspects of time (oriented to month and date, states year as "1920"), presents with dysarthria but is able to answer questions and appears to comprehend well. He appears to have a relatively good understanding of his medical history (eg stroke history). He denies having any concerns about current cognitive functioning. He denies experiencing any problems with memory, attention, language comprehension, language expression (aside from dysarthria) or visual spatial skills. He tells me he lived independently prior to his stroke, in an apartment. He tells me that he wants to move to an assisted living apartment. He tells me that he uses a wheelchair (but walks his feet to propel himself) 24/7, does not walk. He tells me that he needs assistance transfering into and out of bed. He tells me he is independent in toileting. He denies having any falls. He denies any physical  complaints. He denies being in any pain. He reports his mood is  "good". He denies depressed mood. He reports his appetite is good. He denies any sleep difficulty. He denies being anxious or worried about anything currently. He denies any prior history of depression, anxiety or other mental health condition. He says he used to drink alcohol but not heavily. He does not drink now. He is a former smoker, does not use any tobacco now. He denies history of illicit drug use.    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 Marital history: Widowed, wife passed away 5 years ago. He had been married since 1972. He has no biological children but states he has stepchildren and states he does keep in touch with them.  He tells me he has two brothers in [place] and two cousins who live here, states he speaks to them occasionally but they do not visit him.   Medical History: Past Medical History:  Diagnosis Date  . Abnormal EKG 12/16/2016   Inferior and anteroseptal Q wave infarction pattern  . AKI (acute kidney injury) ([place]) 12/20/2016  . CHF (congestive heart failure) ([place])   . Dehydration   . Diabetes mellitus   . Discomfort of chest wall 10/26/2013  . Elevated CK   . Equinus deformity of foot, acquired 12/22/2013  . Failure to thrive (0-17) 12/20/2016  . Failure to thrive in adult   . Hyperkalemia   . Hyperlipidemia   . Hyperphosphatemia   . Left leg pain 10/29/2011   Et: ?diabetic peripheral neuropathy   Reported improvement with gabapentin in the past    . Low TSH level 06/13/2014  . Lupus   . Metatarsalgia of both feet 12/22/2013  . Mild CAD 2009  . Onychomycosis 12/22/2013  . Pain in joint, ankle and foot 04/01/2014  . Pain in lower limb 12/22/2013  . SLE 12/18/2006   Diagnosed 1993. Presented with abdominal pain and rash. Took prednisone in the past but  no medications now in remission.    . Urinary tract infection with hematuria      Current Medications:  Outpatient Encounter Medications as of 12/18/2017  Medication Sig  . atorvastatin (LIPITOR) 80 MG tablet Place 1 tablet (80 mg total) into feeding tube at bedtime.  . bisacodyl (DULCOLAX) 10 MG suppository Place 10 mg rectally daily as needed for moderate constipation.  . carvedilol (COREG) 3.125 MG tablet Place 1 tablet (3.125 mg total) into feeding tube 2 (two) times daily with a meal.  . clopidogrel (PLAVIX) 75 MG tablet Place 1 tablet (75 mg total) into feeding tube daily.  . folic acid (FOLVITE) 1 MG tablet Place 1 tablet (1 mg total) into feeding tube daily. (Patient not taking: Reported on 11/11/2017)  . gabapentin (NEURONTIN) 600 MG tablet Take 600 mg by mouth at bedtime.  Marland Kitchen losartan (COZAAR) 25 MG tablet Take 25 mg by mouth daily.  . magnesium hydroxide (MILK OF MAGNESIA) 400 MG/5ML suspension Take by mouth daily as needed for mild constipation.  . mirtazapine (REMERON) 15 MG tablet Place 15 mg into feeding tube at bedtime.  . nitroGLYCERIN (NITROSTAT) 0.4 MG SL tablet Place 1 tablet (0.4 mg total) under the tongue every 5 (five) minutes as needed for chest pain.  . promethazine (PHENERGAN) 25 MG/ML injection Inject 25 mg into  the muscle as needed for nausea or vomiting.  . travoprost, benzalkonium, (TRAVATAN) 0.004 % ophthalmic solution Place 1 drop into both eyes at bedtime.   . [DISCONTINUED] sitaGLIPtin (JANUVIA) 100 MG tablet Take 1 tablet (100 mg total) by mouth daily.   No facility-administered encounter medications on file as of 12/18/2017.      Behavioral Observations:   Appearance: Casually dressed, mildly disheveled Gait: In wheelchair, is able to use his legs to propel himself in the wheelchair Speech: Dysarthric but responds quickly and appears to comprehend well Thought process: Appears linear, goal directed Affect: Mildly restricted but euthymic Interpersonal:  Pleasant, appropriate   45 minutes spent face-to-face with patient completing neurobehavioral status exam. 50 minutes spent integrating medical records/clinical data and completing this report. CPT codes [id number] unit; [id number].   TESTING: There is medical necessity to proceed with neuropsychological assessment as the results will be used to aid in differential diagnosis and clinical decision-making and to inform specific treatment recommendations (eg pertaining to living situation). Per medical records reviewed, there has been a change in cognitive functioning since CVA a year ago, and a reasonable suspicion of dementia.   Clinical Decision Making: In considering the patient's current level of functioning, level of presumed impairment, nature of symptoms, emotional and behavioral responses during the interview, level of literacy, and observed level of motivation, a battery of tests was selected and communicated to the psychometrician.   Following the clinical interview/neurobehavioral status exam, the patient completed this full battery of neuropsychological testing with my psychometrician under my supervision (see separate note).   PLAN: Evaluation ongoing; full report to follow and will be posted and forwarded to his physician, Dr. McDiarmid, upon completion.

## 2017-12-18 NOTE — Progress Notes (Addendum)
   Neuropsychology Note  Blake Wilson completed 60 minutes of neuropsychological testing with technician, Milana Kidney, BS, under the supervision of Dr. Macarthur Critchley, Licensed Psychologist. The patient did not appear overtly distressed by the testing session, per behavioral observation or via self-report to the technician. Rest breaks were offered.   Clinical Decision Making: In considering the patient's current level of functioning, level of presumed impairment, nature of symptoms, emotional and behavioral responses during the interview, level of literacy, and observed level of motivation/effort, a battery of tests was selected and communicated to the psychometrician.  Communication between the psychologist and technician was ongoing throughout the testing session and changes were made as deemed necessary based on patient performance on testing, technician observations and additional pertinent factors such as those listed above.  35 minutes spent performing neuropsychological evaluation services/clinical decision making (psychologist). [CPT 24097] 60 minutes spent face-to-face with patient administering standardized tests, 30 minutes spent scoring (technician). [CPT Y8200648, 35329]  Full report to follow.

## 2017-12-23 ENCOUNTER — Encounter: Payer: Self-pay | Admitting: Psychology

## 2017-12-23 NOTE — Progress Notes (Signed)
NEUROPSYCHOLOGICAL EVALUATION   Name:    Blake Wilson  Date of Birth:   1951/04/07 Date of Interview:  12/18/2017 Date of Testing:  12/18/2017   Date of Report:  12/23/2017       Background Information:  Reason for Referral:  Blake Wilson is a 67 y.o. male referred by Dr. Sherren Mocha McDiarmid of [place] to assess his current level of cognitive functioning and assist in differential diagnosis. The current evaluation consisted of a review of available medical records, an interview with the patient, and the completion of a neuropsychological testing battery. Informed consent was obtained.  History of Presenting Problem:   Record Review: According to the patient's medical records, he was hospitalized 12/13/2016-12/18/2016 after presenting to the ED with weakness/altered mental status. He was alert and oriented upon admission but had slowed speech, intermittent dysarthria, expressive aphasia and right sided hemiparesis. MRI brain showed acute ischemic nonhemorrhagic pontine infarct, no associated mass effect. There reportedly also were multiple chronic infarcts seen, and abnormal flow void within the distal left vertebral artery, compatible with slow flow and/or occlusion, suspected to be chronic in nature given the presence of multiple remote left cerebellar infarcts.Hospitalization was complicated by UTI. He was discharged to [facility].   He was re-admitted to the hospital 2 days later for worsening condition, inability to swallow, and had become nonverbal. MRI showed extension of bilateral pontine stroke from previous hospitalization. PEG tube was placed. He was discharged back to [facility] on 12/31/2016. MoCA score on that date was recorded as 18/25 on modified administration of the test (communication board was used and several tasks were unable to be completed due to language deficits).   He apparently was started on Depakote by psychiatry in May 2018. No  change in mood since starting the medication was noted so she was tapered off the medication in August 2018.  PEG tube was removed 11/18.  He was seen by Cecille Rubin, NP, at [facility] for stroke follow-up on 02/27/2017 and 11/11/2017 (discharged from stroke clinic on the latter date). Per Ms. Martin's notes,"His therapies [in the SNF] have concluded because he has refused further therapy. He requires assistance for ADLs."   Most recent nursing home family medicine note is from 09/24/2017 and reports the patient is dependent for bathing, dressing, toileting (incontinent), and eating, and requires assistance with ambulation. It reports he is independent with managing the telephone and managing finances and needs assistance with managing medications. He is dependent for cooking, housework, shopping, and using transportation. It was noted that the "patient wants to go home. Will have neuropsychiatric evaluation to determine if patient could live at home alone." It was reported that the patient declines contacting family, has one distant cousin who he does not speak with, otherwise no family.  Patient interview/neurobehavioral status exam: At today's visit (12/18/2017), the patient is alert, oriented to place and some aspects of time (oriented to month, states year as "1920"-possibly secondary to paraphasic error rather than disorientation), presents with dysarthria but is able to answer questions and appears to comprehend conversational speech well. He appears to have a relatively good understanding of his medical history (eg stroke history). He denies having any concerns about current cognitive functioning. He denies experiencing any problems with memory, attention, language comprehension, language expression (aside from dysarthria) or visual spatial skills. He tells me he lived independently prior to his stroke, in an apartment. He tells me that he wants to move to an  assisted living  apartment. He tells me that he uses a wheelchair (but walks his feet to propel himself) 24/7, does not walk. He tells me that his physical therapy was stopped at the SNF and denies having requested it to stop. He reports he was participating in therapies when they were offered to him. He tells me that he needs assistance transfering into and out of bed. He tells me he is independent in toileting. He denies having any falls. He denies any physical complaints. He denies being in any pain. He reports his mood is "good". He denies depressed mood. He reports his appetite is good. He denies any sleep difficulty. He denies being anxious or worried about anything currently. He denies any prior history of depression, anxiety or other mental health condition. He says he used to drink alcohol but not heavily. He does not drink any alcohol now. He is a former smoker, does not use any tobacco now. He denies history of illicit drug use.    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 Marital history: Widowed, wife passed away 5 years ago. He had been married since 1972. He has no biological children but states he has stepchildren and states he does keep in touch with them.  He tells me he has two brothers in [place] and two cousins who live here, states he speaks to them occasionally but they do not visit him.   Medical History:  Past Medical History:  Diagnosis Date  . Abnormal EKG 12/16/2016   Inferior and anteroseptal Q wave infarction pattern  . AKI (acute kidney injury) ([place]) 12/20/2016  . CHF (congestive heart failure) ([place])   . Dehydration   . Diabetes mellitus   . Discomfort of chest wall 10/26/2013  . Elevated CK   . Equinus deformity of foot, acquired 12/22/2013  . Failure to thrive (0-17)  12/20/2016  . Failure to thrive in adult   . Hyperkalemia   . Hyperlipidemia   . Hyperphosphatemia   . Left leg pain 10/29/2011   Et: ?diabetic peripheral neuropathy   Reported improvement with gabapentin in the past    . Low TSH level 06/13/2014  . Lupus   . Metatarsalgia of both feet 12/22/2013  . Mild CAD 2009  . Onychomycosis 12/22/2013  . Pain in joint, ankle and foot 04/01/2014  . Pain in lower limb 12/22/2013  . SLE 12/18/2006   Diagnosed 1993. Presented with abdominal pain and rash. Took prednisone in the past but no medications now in remission.    . Urinary tract infection with hematuria     Current medications:  Outpatient Encounter Medications as of 12/18/2017  Medication Sig  . atorvastatin (LIPITOR) 80 MG tablet Place 1 tablet (80 mg total) into feeding tube at bedtime.  . bisacodyl (DULCOLAX) 10 MG suppository Place 10 mg rectally daily as needed for moderate constipation.  . carvedilol (COREG) 3.125 MG tablet Place 1 tablet (3.125 mg total) into feeding tube 2 (two) times daily with a meal.  . clopidogrel (PLAVIX) 75 MG tablet Place 1 tablet (75 mg total) into feeding tube daily.  . folic acid (FOLVITE) 1 MG tablet Place 1 tablet (1 mg total) into feeding tube daily. (Patient not taking: Reported on 11/11/2017)  . gabapentin (NEURONTIN) 600 MG tablet Take 600 mg by mouth  at bedtime.  Marland Kitchen losartan (COZAAR) 25 MG tablet Take 25 mg by mouth daily.  . magnesium hydroxide (MILK OF MAGNESIA) 400 MG/5ML suspension Take by mouth daily as needed for mild constipation.  . mirtazapine (REMERON) 15 MG tablet Place 15 mg into feeding tube at bedtime.  . nitroGLYCERIN (NITROSTAT) 0.4 MG SL tablet Place 1 tablet (0.4 mg total) under the tongue every 5 (five) minutes as needed for chest pain.  . promethazine (PHENERGAN) 25 MG/ML injection Inject 25 mg into the muscle as needed for nausea or vomiting.  . travoprost, benzalkonium, (TRAVATAN) 0.004 % ophthalmic solution Place 1 drop into both eyes at  bedtime.    No facility-administered encounter medications on file as of 12/18/2017.     Current Examination:  Behavioral Observations:  Appearance: Casually dressed, mildly disheveled Gait: In wheelchair, is able to use his legs to propel himself in the wheelchair Speech: Dysarthric but responds quickly and appears to comprehend well Thought process: Appears linear, goal directed Affect: Mildly restricted but euthymic Interpersonal: Pleasant, appropriate Orientation: Oriented to person, place, and current month. Disoriented to date/day (one day off on the date and day of the week, reported year as "1920"-possibly paraphasic error rather than disorientation). Accurately named the current President but could not recall the name of his predecessor (did accurately select Obama from multiple choice options).   Tests Administered: Marland Kitchen Montreal Cognitive Assessment (MoCA) . Test of Premorbid Functioning (TOPF) . Wechsler Adult Intelligence Scale-Fourth Edition (WAIS-IV): Digit Span subtests . [employer] Verbal Learning Test - 2nd Edition (CVLT-2) Short Form . Repeatable Battery for the Assessment of Neuropsychological Status (RBANS) Form A:  Figure Copy and Recall subtests . Neuropsychological Assessment Battery (NAB) Language Module, Form 1:  Auditory Comprehension subtest and Naming subtests . Controlled Oral Word Association Test (COWAT) - Animals only . Trail Making Test A and B . Geriatric Depression Scale (GDS) 15 Item  Test Results: Note: Standardized scores are presented only for use by appropriately trained professionals and to allow for any future test-retest comparison. These scores should not be interpreted without consideration of all the information that is contained in the rest of the report. The most recent standardization samples from the test publisher or other sources were used whenever possible to derive standard scores; scores were corrected for age, gender, ethnicity and  education when available.   Test Scores:  Test Name Raw Score Standardized Score Descriptor  MoCA 11/30  Impaired  TOPF 28/70 SS= 88 Low average  WAIS-IV Subtests     Digit Span Forward 8/16 ss= 8 Average  Digit Span Backward 6/16 ss= 8 Average  RBANS Subtests     Figure Copy 9/20 Z= -5.4 Severely impaired  Figure Recall 5/20 Z= -2.2 Impaired  CVLT-II Scores     Trial 1 3/9 Z= -3 Severely impaired  Trial 4 3/9 Z= -2.5 Impaired  Trials 1-4 total 13/36 T= 21 Severely impaired  SD Free Recall 4/9 Z= -1.5 Borderline  LD Free Recall 0/9 Z= -2 Impaired  LD Cued Recall 5/9 Z= -0.5 Average  Recognition Discriminability 5/9 hits, 0 false positives Z= -0.5 Average  Forced Choice Recognition 9/9  WNL  NAB Language subtests     Auditory Comprehension Colors: 13/13 Shapes: 21/22 Pointing: 6/6 Y/N questions: 6/10 100 cum% 10 cum% 100 cum% 3 cum%   Naming 26/31 T= 29 Impaired  COWAT-Animals 12 T= 35 Borderline  Trail Making Test A  90" 0 errors T= 4 Severely impaired  Trail Making Test B  Pt  unable     GDS-15 5/15  Mild      Description of Test Results:  Premorbid verbal intellectual abilities were estimated to have been within the low average to average range based on a test of word reading.   Performance on the MoCA was impaired and suggestive of dementia (raw score 11/30). He demonstrated visuospatial/executive dysfunction, attention difficulties, severely impaired phonemic fluency, impaired abstraction, and impaired delayed recall (0/5 items; 1/5 with cues). Naming and repetition of statements was intact.  Psychomotor processing speed was severely impaired on Trails A. Auditory attention and working memory were average on digit span tasks. Visual-spatial construction was severely impaired on his drawn copy of a complex geometric figure. Language abilities were variable. Specifically, confrontation naming was impaired for more complex items, and semantic verbal fluency was  borderline impaired (12 animals in 1 minute). Auditory comprehension of complex ideational material was intact for commands but impaired for complex ideational material. With regard to verbal memory, encoding and acquisition of non-contextual information (i.e., word list) was severely impaired. After a brief distracter task, free recall was borderline impaired (4/9 items). After a delay, free recall was impaired (0/9 items). Cued recall was average (5/9 items). Performance on a yes/no recognition task was impaired due to low number of target items recognized (but no false positive errors). With regard to non-verbal memory, delayed free recall of visual information was impaired, although this may have been affected by poor initial rendering of visual information. Executive functioning was mostly impaired. Mental flexibility and set-shifting were severely impaired; he was unable to complete Trails B. Verbal fluency with phonemic search restrictions was severely impaired on the MoCA for the letter F (1 word in 1 minute). Verbal abstract reasoning was severely impaired on the MoCA. Performance on a clock drawing task was impaired due to poor visual-spatial construction and micrographia, but time placement was correct.   On a self-report measure of mood, the patient's responses were suggestive of mild depression at the present time. Symptoms endorsed included: dropping activities/interests, boredom, preferring to stay home rather than go do things, feelings of worthlessness and reduced energy. Of course, these symptoms must be interpreted within the context of his current living situation and physical condition. He does NOT report dissatisfaction with life, feeling life is empty, being in poor spirits, fear/anxiety, unhappiness, feelings of helplessness, or hopelessness.   Clinical Impressions: Mild to moderate dementia, vascular (most likely due to strategic infarct, but difficult to know if there was any cognitive  impairment prior to stroke).  Results of cognitive evaluation were clearly abnormal with significant deficits across multiple domains of cognitive function. Additionally, there is evidence that his cognitive deficits are interfering with his ability to manage complex ADLs. As such, diagnostic criteria for a dementia syndrome are met.  The patient has a known history of bilateral pontine stroke in 11/2016 which resulted in dysphagia, expressive aphasia, and right side hemiparesis. He was living independently and managing all complex ADLs prior to this stroke although he was disabled from work due to a prior heart attack. It is possible he could have had some mild vascular cognitive impairment prior to the stroke, but the stroke is most likely the etiology of his dementia.  Unfortunately, at a year post-CVA, he continues to demonstrate significant deficits in psychomotor processing speed, memory, visual-spatial skills, language and executive functioning. Basic attention and auditory comprehension are relatively intact. Auditory comprehension breaks down with more complex ideational material and syntax. Based on the level of cognitive impairment,  I would characterize this as mild to moderate stage dementia. It does not seem that he will have significant improvement in cognitive functioning given that we are a year post-stroke. However, he may benefit from additional speech therapy sessions to help improve communication. With regard to mood, I do not see evidence of a major depressive episode but I do believe the patient's current living situation and physical limitations are causing some situational/adjustment-related depression. Lack of family/social support system likely contributes to this. Prior psychiatric history is unknown (negative per the patient).    Recommendations/Plan: Based on the findings of the present evaluation, the following recommendations are offered:  1. I would have significant concerns  about the patient's ability to independently manage complex ADLs (including appointments, medications, transportation and more complex financial affairs) if he were to live independently, based on the level of cognitive impairment demonstrated on this evaluation. He appears, from a cognitive perspective, to be a good fit for assisted living where most or all of these complex ADLs would be managed for him. I do not suspect that his dementia is neurodegenerative and therefore would anticipate that if his medical condition stays stable he would not have significant cognitive decline. 2. Re-initiation of speech therapy to enhance communication and compensatory strategies for cognitive dysfunction may be considered. 3. Re-initiation of physical/occupational therapy may also be reconsidered. The patient reports that he is willing to participate in therapies. I am not sure if these were stopped due to refusal to participate, due to maximal gains being made, or other reason. To me, he appears motivated and willing to participate in such therapies if indicated.    Thank you for your referral of [facility]. Please feel free to contact me if you have any questions or concerns regarding this report.    Total time spent on this patient's case: 95 minutes for neurobehavioral status exam with psychologist (CPT code [phone], [phone]); 90 minutes of testing/scoring by psychometrician under psychologist's supervision (CPT codes [phone], [phone] units); 180 minutes for integration of patient data, interpretation of standardized test results and clinical data, clinical decision making, treatment planning and preparation of this report, and interactive feedback with review of results to the patient/family by psychologist (CPT codes [phone], [phone] units).

## 2017-12-23 NOTE — Progress Notes (Signed)
Full neuropsychological evaluation report is completed and posted as an addendum to 12/18/2017 visit. Please see 12/18/2017 visit encounter (under [facility]) for full report.

## 2017-12-31 ENCOUNTER — Encounter: Payer: Self-pay | Admitting: Internal Medicine

## 2017-12-31 ENCOUNTER — Non-Acute Institutional Stay (INDEPENDENT_AMBULATORY_CARE_PROVIDER_SITE_OTHER): Payer: PPO | Admitting: Internal Medicine

## 2017-12-31 DIAGNOSIS — E1142 Type 2 diabetes mellitus with diabetic polyneuropathy: Secondary | ICD-10-CM

## 2017-12-31 DIAGNOSIS — E1140 Type 2 diabetes mellitus with diabetic neuropathy, unspecified: Secondary | ICD-10-CM

## 2017-12-31 DIAGNOSIS — F0150 Vascular dementia without behavioral disturbance: Secondary | ICD-10-CM | POA: Insufficient documentation

## 2017-12-31 DIAGNOSIS — I10 Essential (primary) hypertension: Secondary | ICD-10-CM

## 2017-12-31 DIAGNOSIS — F03A0 Unspecified dementia, mild, without behavioral disturbance, psychotic disturbance, mood disturbance, and anxiety: Secondary | ICD-10-CM

## 2017-12-31 DIAGNOSIS — E785 Hyperlipidemia, unspecified: Secondary | ICD-10-CM

## 2017-12-31 DIAGNOSIS — F0390 Unspecified dementia without behavioral disturbance: Secondary | ICD-10-CM

## 2017-12-31 DIAGNOSIS — R1312 Dysphagia, oropharyngeal phase: Secondary | ICD-10-CM

## 2017-12-31 DIAGNOSIS — E1169 Type 2 diabetes mellitus with other specified complication: Secondary | ICD-10-CM

## 2017-12-31 DIAGNOSIS — I420 Dilated cardiomyopathy: Secondary | ICD-10-CM

## 2017-12-31 DIAGNOSIS — I63012 Cerebral infarction due to thrombosis of left vertebral artery: Secondary | ICD-10-CM

## 2017-12-31 NOTE — Progress Notes (Signed)
[place]  Visit  Primary Care Provider: Dr. Sherren Mocha McDiarmid Location of Care: [facility] Visit Information: a scheduled routine follow-up visit Patient accompanied by patient Source(s) of information for visit: patient and nursing home  Chief Complaint: No chief complaint on file.   Nursing Concerns: None, other than listed below  Behavioral Concerns: None  Nutrition Concerns: Good PO intake.   Wound Care Nurse Concerns: none  PT Concerns and Goals: Concerns none;  improved activity tolerance, improved balance and ability to compensate for deficits;  Goals Partially Met  Physical Restraint: No    If SNF admission, patient's goal for the rehabilitation admission:  Goals identified by the patient:;  increase overall strength and endurance and functional communication with caregivers     Family Goals: refusing contact with family   If SNF admission, discharge disposition goals:  Lives alone without family in area. Wants to go home.    HISTORY OF PRESENT ILLNESS: Outpatient Encounter Medications as of 12/31/2017  Medication Sig  . atorvastatin (LIPITOR) 80 MG tablet Place 1 tablet (80 mg total) into feeding tube at bedtime.  . bisacodyl (DULCOLAX) 10 MG suppository Place 10 mg rectally daily as needed for moderate constipation.  . carvedilol (COREG) 3.125 MG tablet Place 1 tablet (3.125 mg total) into feeding tube 2 (two) times daily with a meal.  . clopidogrel (PLAVIX) 75 MG tablet Place 1 tablet (75 mg total) into feeding tube daily.  . folic acid (FOLVITE) 1 MG tablet Place 1 tablet (1 mg total) into feeding tube daily. (Patient not taking: Reported on 11/11/2017)  . gabapentin (NEURONTIN) 600 MG tablet Take 600 mg by mouth at bedtime.  Marland Kitchen losartan (COZAAR) 25 MG tablet Take 25 mg by mouth daily.  . magnesium hydroxide (MILK OF MAGNESIA) 400 MG/5ML suspension Take by mouth daily as needed for mild constipation.  . mirtazapine (REMERON) 15 MG tablet Place  15 mg into feeding tube at bedtime.  . nitroGLYCERIN (NITROSTAT) 0.4 MG SL tablet Place 1 tablet (0.4 mg total) under the tongue every 5 (five) minutes as needed for chest pain.  . promethazine (PHENERGAN) 25 MG/ML injection Inject 25 mg into the muscle as needed for nausea or vomiting.  . travoprost, benzalkonium, (TRAVATAN) 0.004 % ophthalmic solution Place 1 drop into both eyes at bedtime.   . [DISCONTINUED] sitaGLIPtin (JANUVIA) 100 MG tablet Take 1 tablet (100 mg total) by mouth daily.   No facility-administered encounter medications on file as of 12/31/2017.    No Known Allergies History Patient Active Problem List   Diagnosis Date Noted  . Mild dementia 12/31/2017  . S/P percutaneous endoscopic gastrostomy (PEG) tube placement ([place]) 03/14/2017  . Living in nursing home 01/02/2017  . Late effect of cerebrovascular accident (CVA)   . Oropharyngeal dysphagia   . Aphasia 12/16/2016  . CVA (cerebral vascular accident) ([place])   . Chronic systolic heart failure ([place]) 04/01/2014  . Diabetic neuropathy, painful ([place]) 12/22/2013  . GOUT, UNSPECIFIED 06/26/2010  . Type 2 diabetes mellitus with diabetic polyneuropathy, without long-term current use of insulin ([place]) 06/29/2009  . Unspecified glaucoma 04/17/2009  . Hyperlipidemia due to type 2 diabetes mellitus ([place]) 03/02/2009  . Coronary atherosclerosis 03/02/2009  . OBESITY 01/17/2009  . Alcohol abuse 01/09/2009  . Congestive dilated cardiomyopathy ([place]) 01/09/2009  . HYPERTENSION, BENIGN SYSTEMIC 12/18/2006   Past Medical History:  Diagnosis Date  . Abnormal EKG 12/16/2016   Inferior and anteroseptal Q wave infarction pattern  . AKI (acute kidney injury) ([place]) 12/20/2016  .  CHF (congestive heart failure) ([place])   . Dehydration   . Diabetes mellitus   . Discomfort of chest wall 10/26/2013  . Elevated CK   . Equinus deformity of foot, acquired 12/22/2013  . Failure to thrive (0-17) 12/20/2016  . Failure to thrive in adult   . Hyperkalemia    . Hyperlipidemia   . Hyperphosphatemia   . Left leg pain 10/29/2011   Et: ?diabetic peripheral neuropathy   Reported improvement with gabapentin in the past    . Low TSH level 06/13/2014  . Lupus   . Metatarsalgia of both feet 12/22/2013  . Mild CAD 2009  . Onychomycosis 12/22/2013  . Pain in joint, ankle and foot 04/01/2014  . Pain in lower limb 12/22/2013  . SLE 12/18/2006   Diagnosed 1993. Presented with abdominal pain and rash. Took prednisone in the past but no medications now in remission.    . Urinary tract infection with hematuria    Past Surgical History:  Procedure Laterality Date  . IR GASTROSTOMY TUBE REMOVAL  08/26/2017  . IR GENERIC HISTORICAL  12/25/2016   IR GASTROSTOMY TUBE MOD SED 12/25/2016 Sandi Mariscal, MD [facility]-INTERV RAD  . TEE WITHOUT CARDIOVERSION N/A 12/17/2016   Procedure: TRANSESOPHAGEAL ECHOCARDIOGRAM (TEE);  Surgeon: Skeet Latch, MD;  Location[facility] ENDOSCOPY;  Service: Cardiovascular;  Laterality: N/A;   Family History  Problem Relation Age of Onset  . Cirrhosis Brother   . Alcohol abuse Brother   . Stroke Maternal Grandmother   . Cancer Brother        throat cancer (smoker)    reports that he quit smoking about 16 years ago. He has quit using smokeless tobacco. He reports that he does not drink alcohol or use drugs.  Basic Activities of Daily Living   ADLs Independent Needs Assistance Dependent  Bathing   X  Dressing   X  Ambulation  X   Toileting   X (Incontinent)  Eating   X      Instrumental Activities of Daily Living  IADL Independent Needs Assistance Dependent  Cooking   X  Housework   X  Manage Medications  X   Manage the telephone X    Shopping for food, clothes, Meds, etc   X  Use transportation   X  Manage Finances X      Falls in the past six months:   yes  Diet:  Chopped/thin diet Feeding Tube: no  Hydration Status: well hydrated  Communication Barriers: Dysarthric  Review of Systems  Patient has ability to communicate answers  to ROS: yes See HPI General: Denies fevers, chills, progressive fatigue, weight gain.  Eyes: Denies pain, blurred vision  Ears/Nose/Throat: Denies ear pain, throat pain, rhinorrhea, nasal congestion.  Cardiovascular: Denies chest pains, palpitations, dyspnea on exertion, orthopnea, peripheral edema.  Respiratory: Denies cough, sputum, dyspnea  Gastrointestinal: Denies abdominal pain, bloating, constipation, diarrhea.  Genitourinary: Denies dysuria, urinary frequency, discharge Musculoskeletal: Denies joint pain, swelling, weakness.  Skin: Denies skin rash or ulcers. Neurologic: Denies transient paralysis, weakness, paresthesias, headache.  Psychiatric: Denies depression, anxiety, psychosis. Endocrine: Denies weight loss    Geriatric Syndromes: Constipation no ,   Incontinence yes  Dizziness no   Syncope no   Skin problems no   Visual Impairment no   Hearing impairment no  Eating impairment no Impaired Memory or Cognition yes   Behavioral problems no   Sleep problems no   Weight loss none  Mood: normal   Psychotropic Medication Use:  Sedative-hypnotics /  Anxiolytics: No  Antipsychotics:No Antidepressants: No   PHYSICAL EXAM:. Wt Readings from Last 3 Encounters:  11/11/17 174 lb (78.9 kg)  09/22/17 186 lb (84.4 kg)  03/21/17 182 lb 12.8 oz (82.9 kg)   Temp Readings from Last 3 Encounters:  12/31/17 (!) 97 F (36.1 C)  01/02/17 (!) 96.8 F (36 C) (Oral)  12/31/16 98.3 F (36.8 C) (Oral)   BP Readings from Last 3 Encounters:  12/31/17 123/81  11/11/17 130/66  10/10/17 118/82   Pulse Readings from Last 3 Encounters:  12/31/17 61  11/11/17 67  10/10/17 70    General: alert, cooperative, no distress, well nourished, pleasant, clean, groomed HEENT:  No scleral icterus, no nasal secretions, [place] moist and no erythema or lesion Neck:  Supple, no lymphadenopathy CV:  RRR, no murmur, no ankle swelling RESP: No resp distress or accessory muscle use.  Clear  to ausc bilat. No wheezing, no rales, no rhonchi.  ABD:  Soft, Non-tender, non-distended, +bowel sounds, no masses MSK:  No back pain, no joint pain.  No joint swelling or redness. Does have limited ROM in right shoulder. EXT: Warm and well perfused, No cyanosis and No edema    Gait:  Non ambulatory Skin: No rashes or skin ulcers noted Neurologic: PERRLA. CN intact. Muscle strength 5/5 in upper extremities bilaterally. 4/5 strength in hip flexion noted bilaterally. Sensation intact. Able to speak in short sentences.   MMSE or MoCa:  18 / 30 (on 01/01/17) Years of Education: 12 +   Renal/Electrolytes (last) BMP Latest Ref Rng & Units 05/27/2017 05/20/2017 05/14/2017  Glucose 65 - 99 mg/dL - - -  BUN 4 - 21 23(A) 41(A) 71(A)  Creatinine 0.50 - 1.10 0.89 1.3 -  BUN/Creat Ratio 6.0 - 25.0 26.3(A) - 30.8(A)  Sodium 137 - 147 133(A) 140 132(A)  Potassium 3.4 - 5.3 3.9 4.3 4.5  Chloride 99 - 108 95(A) - 94(A)  CO2 22 - 31 25 - 21(A)  Calcium 8.7 - 10.7 9.1 - 9.0  , '@10RELATIVEDAYS' @ Lab Results  Component Value Date   CALCIUM 9.1 05/27/2017   PHOS 4 01/01/2017   LFTs (last) Lab Results  Component Value Date   ALT 79 (A) 02/19/2017   AST 32 02/19/2017   ALKPHOS 80 02/19/2017   BILITOT 0.3 02/19/2017   Albumin & Total Protein Lab Results  Component Value Date   LABPROT 14.0 12/24/2016   Lipase (last) No results found for: LIPASE Amylase (last) No results found for: AMYLASE CBC (last) CBC Latest Ref Rng & Units 05/01/2017 12/31/2016 12/26/2016  WBC 4.0 - 10.5 K/uL - 7.2 5.6  Hemoglobin 13.5 - 17.5 12.2(A) 15.2 13.2  Hematocrit 41 - 53 37(A) 45.9 39.1  Platelets 150 - 400 K/uL - 261 213   Lipids (last) Lab Results  Component Value Date   CHOL 129 02/19/2017   HDL 42 11/14/2016   LDLCALC 76 02/19/2017   LDLDIRECT 71 10/07/2012   TRIG 108 02/19/2017   CHOLHDL 4.1 02/19/2017   Cardiac Enzymes  Lab Results  Component Value Date   CKTOTAL 82 12/20/2016   CKMB 6.8 (H)  06/25/2007   TROPONINI (H) 06/25/2007    0.13        PERSISTENTLY INCREASED TROPONIN VALUES IN THE RANGE OF 0.06-0.49 ng/mL CAN BE SEEN IN:       -UNSTABLE ANGINA   BNP (last 3 results)  No results for input(s): BNP in the last 8760 hours. ProBNP (last 3 results)  No results for input(s):  PROBNP in the last 8760 hours. CBG (last 3)  No results for input(s): GLUCAP in the last 72 hours. A1c (last) Lab Results  Component Value Date   HGBA1C 5.5 09/22/2017   Imaging    Health Maintenance due or soon due Diabetes Health Maintenance Due  Topic Date Due  . OPHTHALMOLOGY EXAM  11/30/2016  . FOOT EXAM  12/06/2017  . HEMOGLOBIN A1C  03/23/2018     Assessment and Plan:   See Problem List for individual problem's assessment and plans.   Type 2 diabetes mellitus with diabetic polyneuropathy, without long-term current use of insulin (HCC) A1C in 09/22/17 was 5.4 Obtain A1C today  Diet controlled   HYPERTENSION, BENIGN SYSTEMIC Blood pressure has been well controlled Continue Losartan and Coreg  Recommend BMET in  05/2018  Hyperlipidemia due to type 2 diabetes mellitus ([place]) Continue atorvastatin Recommend yearly lipid panel August 2019   CVA (cerebral vascular accident) ([place]) Hx of bilateral pontine stroke in 11/2016 which resulted in dysphagia, expressive aphasia, and right side hemiparesis. Current exam with notable for bilateral strength 5/5 strength in upper extremities, 4/5 on right lower extremity with 5/5 strength on left lower extremity.   Patient is now over a year out from this insult  - ContinueClopidogrel 17m.  - Continue to workwithPhysical Therapy, and Occupational Therapy - consider restarting speech therapy per neurospyschiatry recommendations - will send in a referral for evaluation   Mild dementia Per Neuropysch patient with mild to moderate dementia -  vascular (most likely due to strategic infarct, but difficult to know if there was any cognitive  impairment prior to stroke) Per neuropysch recommendation patient seems to be a good fit for assisted living where most or all of these complex ADLs would be managed for him.    Congestive dilated cardiomyopathy Euvolemic  Continue Coreg and Losartan   Diabetic neuropathy, painful (HCC) Continue Gabapentin  Denies any numbness or tingling   Oropharyngeal dysphagia No reports of aspiration. Overall doing well feeding himself    Future labs/tests needed: Will get repeat A1C 3/18. Patient wants to go home. Will have neuropsychiatric evaluation to determine if patient could live at home alone. This appointment is 12/18/16. Will follow up.   Time spent > 252m ;> 50% of time with patient was spent reviewing records, labs, tests and studies, counseling, discussion with family, discussion with nursing facility staff, discussion with patient's referring providers, discussion with patient's specialists in developing plan of care  Family communications: Mr. FaLaysoneclines contacting family. Has one distant cousin who he does not speak with, otherwise no family.  Code Status:    Full Intubation Status: If Needed Intravenous Fluids: If Needed Feeding Tubes: If Needed.  Antibiotics: If Needed Hospitalization: If Needed Emergency contact:  None Follow Up:  Next 60 days unless acute issues arise.     AsKerrin MoMD, PGY-3 [facility]

## 2017-12-31 NOTE — Assessment & Plan Note (Addendum)
Blood pressure has been well controlled Continue Losartan and Coreg  Recommend BMET in  05/2018

## 2017-12-31 NOTE — Assessment & Plan Note (Signed)
Continue atorvastatin Recommend yearly lipid panel August 2019

## 2017-12-31 NOTE — Assessment & Plan Note (Signed)
A1C in 09/22/17 was 5.4 Obtain A1C today  Diet controlled

## 2017-12-31 NOTE — Assessment & Plan Note (Signed)
Continue Gabapentin  Denies any numbness or tingling

## 2017-12-31 NOTE — Assessment & Plan Note (Signed)
Euvolemic  Continue Coreg and Losartan

## 2017-12-31 NOTE — Assessment & Plan Note (Addendum)
Per Neuropysch patient with mild to moderate dementia -  vascular (most likely due to strategic infarct, but difficult to know if there was any cognitive impairment prior to stroke) Per neuropysch recommendation patient seems to be a good fit for assisted living where most or all of these complex ADLs would be managed for him.

## 2017-12-31 NOTE — Assessment & Plan Note (Addendum)
Hx of bilateral pontine stroke in 11/2016 which resulted in dysphagia, expressive aphasia, and right side hemiparesis. Current exam with notable for bilateral strength 5/5 strength in upper extremities, 4/5 on right lower extremity with 5/5 strength on left lower extremity.   Patient is now over a year out from this insult  - ContinueClopidogrel 75mg .  - Continue to workwithPhysical Therapy, and Occupational Therapy - consider restarting speech therapy per neurospyschiatry recommendations - will send in a referral for evaluation

## 2017-12-31 NOTE — Assessment & Plan Note (Signed)
No reports of aspiration. Overall doing well feeding himself

## 2018-01-01 DIAGNOSIS — R471 Dysarthria and anarthria: Secondary | ICD-10-CM | POA: Diagnosis not present

## 2018-01-01 DIAGNOSIS — R1312 Dysphagia, oropharyngeal phase: Secondary | ICD-10-CM | POA: Diagnosis not present

## 2018-01-01 DIAGNOSIS — E119 Type 2 diabetes mellitus without complications: Secondary | ICD-10-CM | POA: Diagnosis not present

## 2018-01-01 DIAGNOSIS — I69351 Hemiplegia and hemiparesis following cerebral infarction affecting right dominant side: Secondary | ICD-10-CM | POA: Diagnosis not present

## 2018-01-01 DIAGNOSIS — I69391 Dysphagia following cerebral infarction: Secondary | ICD-10-CM | POA: Diagnosis not present

## 2018-01-01 DIAGNOSIS — M6281 Muscle weakness (generalized): Secondary | ICD-10-CM | POA: Diagnosis not present

## 2018-01-01 DIAGNOSIS — Z79899 Other long term (current) drug therapy: Secondary | ICD-10-CM | POA: Diagnosis not present

## 2018-01-01 DIAGNOSIS — R262 Difficulty in walking, not elsewhere classified: Secondary | ICD-10-CM | POA: Diagnosis not present

## 2018-01-19 DIAGNOSIS — I69391 Dysphagia following cerebral infarction: Secondary | ICD-10-CM | POA: Diagnosis not present

## 2018-01-19 DIAGNOSIS — I69320 Aphasia following cerebral infarction: Secondary | ICD-10-CM | POA: Diagnosis not present

## 2018-01-19 DIAGNOSIS — Z794 Long term (current) use of insulin: Secondary | ICD-10-CM | POA: Diagnosis not present

## 2018-01-19 DIAGNOSIS — I69322 Dysarthria following cerebral infarction: Secondary | ICD-10-CM | POA: Diagnosis not present

## 2018-01-19 DIAGNOSIS — Z7982 Long term (current) use of aspirin: Secondary | ICD-10-CM | POA: Diagnosis not present

## 2018-01-19 DIAGNOSIS — E1140 Type 2 diabetes mellitus with diabetic neuropathy, unspecified: Secondary | ICD-10-CM | POA: Diagnosis not present

## 2018-01-19 DIAGNOSIS — I69351 Hemiplegia and hemiparesis following cerebral infarction affecting right dominant side: Secondary | ICD-10-CM | POA: Diagnosis not present

## 2018-01-19 DIAGNOSIS — H409 Unspecified glaucoma: Secondary | ICD-10-CM | POA: Diagnosis not present

## 2018-01-19 DIAGNOSIS — I2510 Atherosclerotic heart disease of native coronary artery without angina pectoris: Secondary | ICD-10-CM | POA: Diagnosis not present

## 2018-01-19 DIAGNOSIS — Z7902 Long term (current) use of antithrombotics/antiplatelets: Secondary | ICD-10-CM | POA: Diagnosis not present

## 2018-01-19 DIAGNOSIS — E785 Hyperlipidemia, unspecified: Secondary | ICD-10-CM | POA: Diagnosis not present

## 2018-01-19 DIAGNOSIS — R1312 Dysphagia, oropharyngeal phase: Secondary | ICD-10-CM | POA: Diagnosis not present

## 2018-01-19 DIAGNOSIS — I5022 Chronic systolic (congestive) heart failure: Secondary | ICD-10-CM | POA: Diagnosis not present

## 2018-01-19 DIAGNOSIS — I420 Dilated cardiomyopathy: Secondary | ICD-10-CM | POA: Diagnosis not present

## 2018-01-26 ENCOUNTER — Ambulatory Visit (INDEPENDENT_AMBULATORY_CARE_PROVIDER_SITE_OTHER): Payer: Medicaid Other | Admitting: Podiatry

## 2018-01-26 DIAGNOSIS — M79675 Pain in left toe(s): Secondary | ICD-10-CM | POA: Diagnosis not present

## 2018-01-26 DIAGNOSIS — M79674 Pain in right toe(s): Secondary | ICD-10-CM | POA: Diagnosis not present

## 2018-01-26 DIAGNOSIS — B351 Tinea unguium: Secondary | ICD-10-CM | POA: Diagnosis not present

## 2018-01-26 DIAGNOSIS — E1151 Type 2 diabetes mellitus with diabetic peripheral angiopathy without gangrene: Secondary | ICD-10-CM

## 2018-01-29 NOTE — Progress Notes (Signed)
Subjective: 67 y.o. returns the office today for painful, elongated, thickened toenails which he cannot trim himself. Denies any redness or drainage around the nails. Denies any acute changes since last appointment and no new complaints today. Denies any systemic complaints such as fevers, chills, nausea, vomiting.   PCP: Kathrene Alu, MD  Objective: NAD- in a wheelchiar DP/PT pulses decreased, CRT less than 3 seconds Nails hypertrophic, dystrophic, elongated, brittle, discolored 10. There is tenderness overlying the nails 1-5 bilaterally. There is no surrounding erythema or drainage along the nail sites. No open lesions or pre-ulcerative lesions are identified. No other areas of tenderness bilateral lower extremities. No overlying edema, erythema, increased warmth. No pain with calf compression, swelling, warmth, erythema.  Assessment: Patient presents with symptomatic onychomycosis  Plan: -Treatment options including alternatives, risks, complications were discussed -Nails sharply debrided 10 without complication/bleeding. -Discussed daily foot inspection. If there are any changes, to call the office immediately.  -Follow-up in 3 months or sooner if any problems are to arise. In the meantime, encouraged to call the office with any questions, concerns, changes symptoms.  Celesta Gentile, DPM

## 2018-02-03 NOTE — Progress Notes (Signed)
Personally  participated in, made any corrections needed, and agree with history, physical, neuro exam,assessment and plan as stated above.    Jaxsun Ciampi, MD [facility] 

## 2018-02-19 ENCOUNTER — Encounter: Payer: PPO | Admitting: Psychology

## 2018-03-03 ENCOUNTER — Telehealth: Payer: Self-pay | Admitting: Family Medicine

## 2018-03-03 NOTE — Telephone Encounter (Signed)
**  After Hours/ Emergency Line Call*  Received a call to report that Blake Wilson from staff, weight on 5/10 187.2 and 5/13 192.2 today 194.0. Staff concerned with weight change as almost 2 lbs in one day. Staff states that he is breathing normally, walking around easily without dyspnea, no edema.  Recommended that we follow up with [facility] team tomorrow, patient is not on diuresis at baseline. Red flags discussed.  Will forward to PCP.  Ralene Ok, MD PGY-2, [facility]

## 2018-03-04 ENCOUNTER — Other Ambulatory Visit: Payer: Self-pay | Admitting: Internal Medicine

## 2018-03-04 NOTE — Telephone Encounter (Signed)
Will check patient's orders to ensure he is no longer getting supplemental nutrition between meals. Will also place orders for CBGs twice daily. Was drinking fruit punch during Geri rounds today.

## 2018-03-17 ENCOUNTER — Encounter: Payer: Self-pay | Admitting: Family Medicine

## 2018-03-17 DIAGNOSIS — I252 Old myocardial infarction: Secondary | ICD-10-CM | POA: Insufficient documentation

## 2018-03-27 ENCOUNTER — Encounter: Payer: Self-pay | Admitting: Pharmacist

## 2018-03-27 NOTE — Progress Notes (Signed)
[facility] Med Update  Blake Wilson, PharmD PGY1 Acute Care Pharmacy Resident 

## 2018-04-01 ENCOUNTER — Other Ambulatory Visit: Payer: Self-pay | Admitting: Internal Medicine

## 2018-04-01 IMAGING — CT CT ANGIO HEAD
2 of 12 series · 8 of 33 positions shown · IV contrast (isovue)
Comparison: None.

CLINICAL DATA: Follow-up nonhemorrhagic pontine infarct. History of
hypertension, lupus, hyperlipidemia and diabetes.

EXAM:
CT ANGIOGRAPHY HEAD AND NECK
TECHNIQUE: Multidetector CT imaging of the head and neck was performed using
the standard protocol during bolus administration of intravenous
contrast. Multiplanar CT image reconstructions and MIPs were
obtained to evaluate the vascular anatomy. Carotid stenosis
measurements (when applicable) are obtained utilizing NASCET
criteria, using the distal internal carotid diameter as the
denominator.
CONTRAST:  50 cc Isovue 370
MRI of the head July 14, 2017

[Series 10: cta neck · axial · 0.44mm/px · z∈[-374,+6]mm · 3 of 191 slices shown]
[im 1/191  soft-tissue]
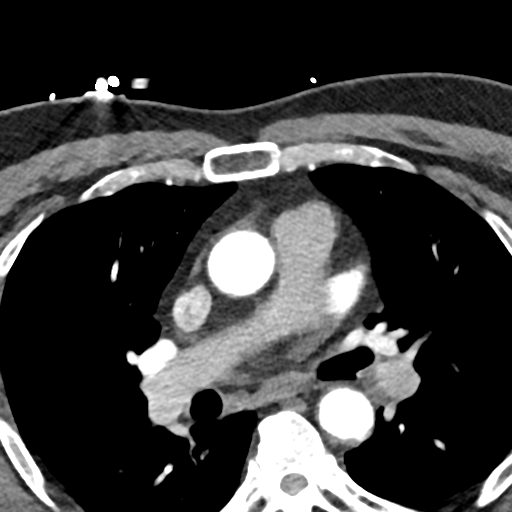
[im 96/191  bone]
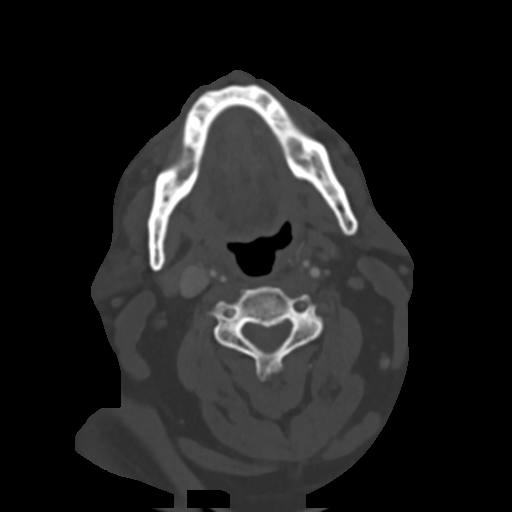
[im 191/191  soft-tissue]
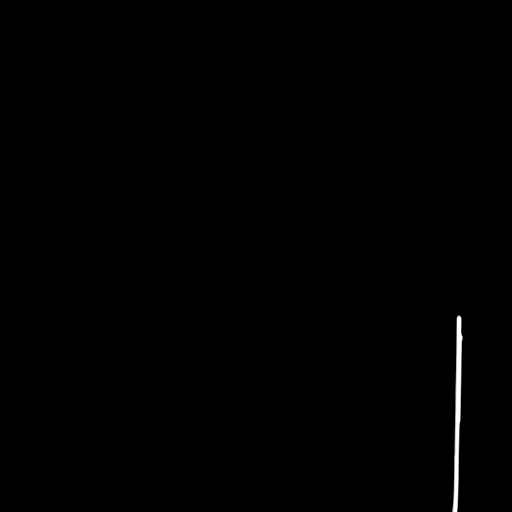

[Series 12: cta neck axial · axial · 0.39mm/px · z∈[-313,-66]mm · 5 of 371 slices shown]
[im 62/371  soft-tissue]
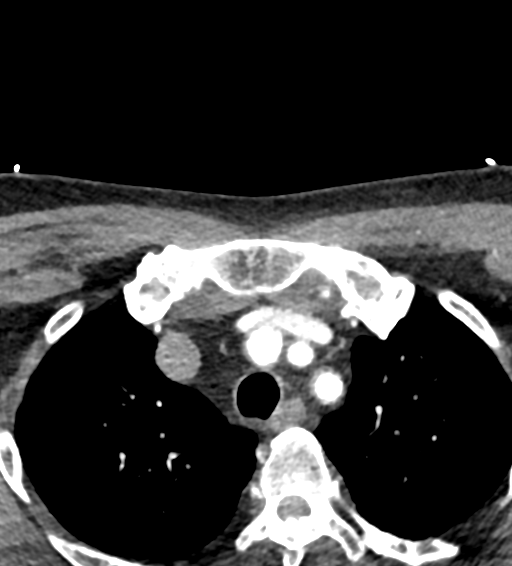
[im 124/371  soft-tissue]
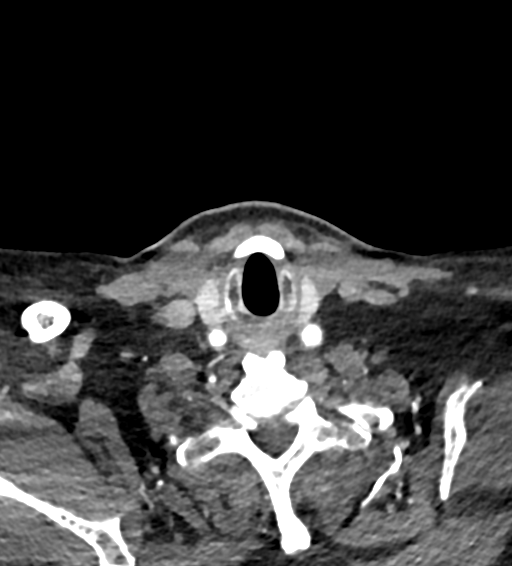
[im 186/371  soft-tissue]
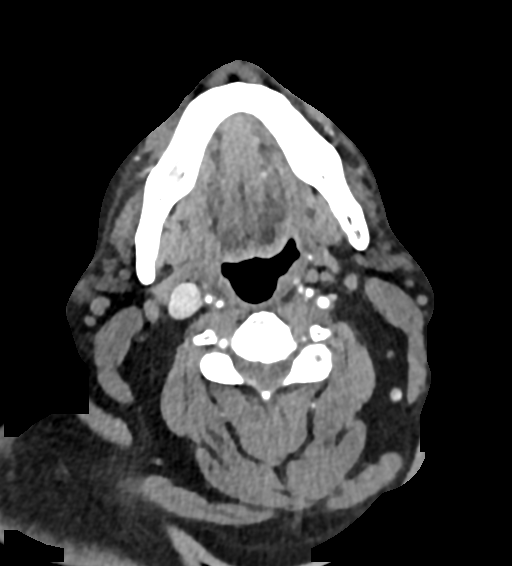
[im 247/371  soft-tissue]
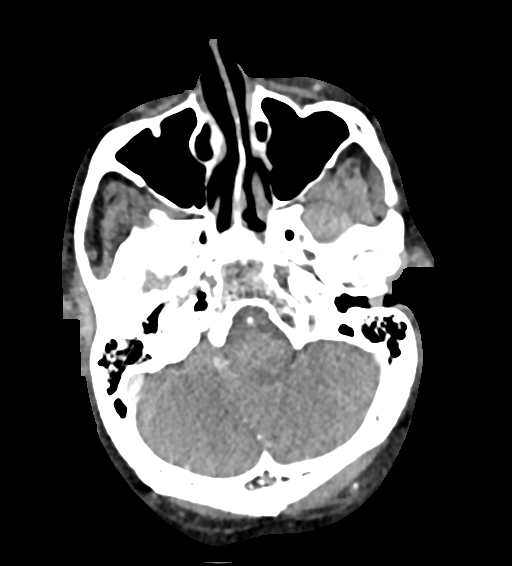
[im 309/371  soft-tissue]
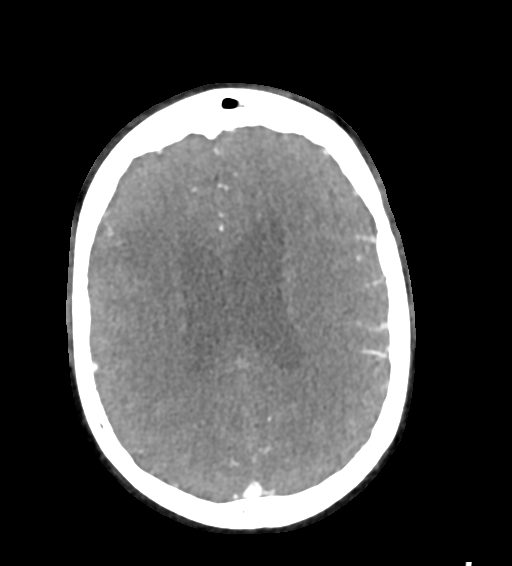

[8 of 33 positions shown; findings below may reference images not displayed]

FINDINGS: CT HEAD FINDINGS

BRAIN: No intraparenchymal hemorrhage, mass effect, midline shift.
Old small bifrontal than small to moderate RIGHT parietal lobe
infarcts. Old bilateral basal ganglia and LEFT cerebellar infarcts.
Evolving nonhemorrhagic LEFT parasagittal pontine infarct. Moderate
ventriculomegaly on the basis of global parenchymal brain volume
loss. No abnormal extra-axial fluid collections. Basal cisterns are
patent.

VASCULAR: Moderate calcific atherosclerosis of the carotid siphons.

SKULL: No skull fracture. Old minimally depressed LEFT nasal bone
fracture. No significant scalp soft tissue swelling.

SINUSES/ORBITS: Trace ethmoid mucosal thickening. Mastoid air cells
are well aerated. Soft tissue effacing the RIGHT and partially
opacifying the LEFT ostiomeatal units most compatible with cerumen.
The included ocular globes and orbital contents are non-suspicious.

OTHER: None.

CTA NECK

AORTIC ARCH: Normal appearance of the thoracic arch, mild calcific
atherosclerosis. Two vessel arch is a normal variant. The origins of
the innominate, left Common carotid artery and subclavian artery are
widely patent.

RIGHT CAROTID SYSTEM: Common carotid artery is widely patent, mild
luminal irregularity compatible with atherosclerosis. Normal
appearance of the carotid bifurcation without hemodynamically
significant stenosis by NASCET criteria. Small, irregular RIGHT
internal carotid artery without dissection or focal stenosis.

LEFT CAROTID SYSTEM: Common carotid artery is widely patent, mild
luminal irregularity compatible with atherosclerosis. Normal
appearance of the carotid bifurcation without hemodynamically
significant stenosis by NASCET criteria, mild eccentric calcific
atherosclerosis. Mild luminal irregularity of the LEFT cervical
internal carotid artery.

VERTEBRAL ARTERIES:LEFT vertebral artery occluded within 1
centimeter of the origin due to eccentric intimal thickening.
Thready reconstitution distal LEFT V2 segment, occluded at distal
LEFT V3 segment. RIGHT vertebral artery is widely patent. Mild
extrinsic luminal narrowing due to degenerative cervical spine.

SKELETON: No acute osseous process though bone windows have not been
submitted. Poor dentition. Moderate degenerative change of the
cervical spine.

OTHER NECK: Soft tissues of the neck are non-acute though, not
tailored for evaluation.

CTA HEAD

ANTERIOR CIRCULATION: Patent bilateral internal carotid arteries.
Severe stenosis RIGHT para ophthalmic internal carotid artery
presumably from atherosclerosis. Occluded RIGHT supraclinoid
internal carotid artery. Thready reconstitution of RIGHT carotid
terminus and proximal RIGHT M1 segment with attenuated RIGHT needle
cerebral artery including severe tandem stenoses RIGHT M1 segment.
Diminutive RIGHT A1 segment with widely patent LEFT A1 segment, and
anterior communicating artery. Patent bilateral anterior cerebral
arteries with moderate luminal irregularity. LEFT middle cerebral
artery is patent with moderate luminal irregularity.

No contrast extravasation or aneurysm.

POSTERIOR CIRCULATION: Occluded LEFT intradural vertebral artery.
Moderate luminal irregularity RIGHT V4 segment. Basilar artery is
patent. Posterior communicating arteries not present. Moderate
luminal irregularity bilateral posterior cerebral arteries.

No contrast extravasation or aneurysm.

VENOUS SINUSES: Major dural venous sinuses are patent though not
tailored for evaluation on this angiographic examination.

ANATOMIC VARIANTS: None.

DELAYED PHASE: No abnormal intracranial enhancement.
IMPRESSION: CT HEAD: Evolving nonhemorrhagic LEFT pontine infarct.

Multiple old small vessel and large vascular territory infarcts.

CTA NECK: Occluded LEFT vertebral artery, thready distal V2
reconstitution, occluded at distal LEFT V3 segment.

Mild long segment narrowing RIGHT cervical internal carotid artery
attributable to atherosclerosis or, fibromuscular dysplasia.

CTA HEAD:  Occluded LEFT vertebral artery.

Occluded RIGHT supraclinoid internal carotid artery, reconstituted
RIGHT carotid terminus with decreased RIGHT middle cerebral artery
perfusion. Severely stenotic RIGHT M1 segment.

Moderate intracranial atherosclerosis.

## 2018-04-01 NOTE — Progress Notes (Signed)
Discontinued Remeron.   Phill Myron, D.O. 04/01/2018, 10:29 AM PGY-3, [place]

## 2018-04-20 DIAGNOSIS — I69351 Hemiplegia and hemiparesis following cerebral infarction affecting right dominant side: Secondary | ICD-10-CM | POA: Diagnosis not present

## 2018-04-20 DIAGNOSIS — M6281 Muscle weakness (generalized): Secondary | ICD-10-CM | POA: Diagnosis not present

## 2018-04-20 DIAGNOSIS — R2689 Other abnormalities of gait and mobility: Secondary | ICD-10-CM | POA: Diagnosis not present

## 2018-04-29 ENCOUNTER — Ambulatory Visit: Payer: Medicaid Other | Admitting: Podiatry

## 2018-05-08 ENCOUNTER — Ambulatory Visit: Payer: Medicaid Other | Admitting: Podiatry

## 2018-05-21 DIAGNOSIS — I69351 Hemiplegia and hemiparesis following cerebral infarction affecting right dominant side: Secondary | ICD-10-CM | POA: Diagnosis not present

## 2018-05-21 DIAGNOSIS — R2689 Other abnormalities of gait and mobility: Secondary | ICD-10-CM | POA: Diagnosis not present

## 2018-05-21 DIAGNOSIS — M6281 Muscle weakness (generalized): Secondary | ICD-10-CM | POA: Diagnosis not present

## 2018-06-22 DIAGNOSIS — R2689 Other abnormalities of gait and mobility: Secondary | ICD-10-CM | POA: Diagnosis not present

## 2018-06-22 DIAGNOSIS — I69351 Hemiplegia and hemiparesis following cerebral infarction affecting right dominant side: Secondary | ICD-10-CM | POA: Diagnosis not present

## 2018-06-22 DIAGNOSIS — M6281 Muscle weakness (generalized): Secondary | ICD-10-CM | POA: Diagnosis not present

## 2018-06-25 DIAGNOSIS — Z79899 Other long term (current) drug therapy: Secondary | ICD-10-CM | POA: Diagnosis not present

## 2018-06-25 DIAGNOSIS — E119 Type 2 diabetes mellitus without complications: Secondary | ICD-10-CM | POA: Diagnosis not present

## 2018-06-30 DIAGNOSIS — F325 Major depressive disorder, single episode, in full remission: Secondary | ICD-10-CM | POA: Diagnosis not present

## 2018-07-08 ENCOUNTER — Non-Acute Institutional Stay: Payer: PPO | Admitting: Family Medicine

## 2018-07-08 ENCOUNTER — Encounter: Payer: Self-pay | Admitting: Family Medicine

## 2018-07-08 DIAGNOSIS — I10 Essential (primary) hypertension: Secondary | ICD-10-CM | POA: Diagnosis not present

## 2018-07-08 DIAGNOSIS — I69319 Unspecified symptoms and signs involving cognitive functions following cerebral infarction: Secondary | ICD-10-CM

## 2018-07-08 DIAGNOSIS — Z593 Problems related to living in residential institution: Secondary | ICD-10-CM | POA: Diagnosis not present

## 2018-07-08 DIAGNOSIS — E1142 Type 2 diabetes mellitus with diabetic polyneuropathy: Secondary | ICD-10-CM

## 2018-07-08 DIAGNOSIS — F0150 Vascular dementia without behavioral disturbance: Secondary | ICD-10-CM | POA: Diagnosis not present

## 2018-07-08 DIAGNOSIS — E785 Hyperlipidemia, unspecified: Secondary | ICD-10-CM

## 2018-07-08 DIAGNOSIS — I420 Dilated cardiomyopathy: Secondary | ICD-10-CM | POA: Diagnosis not present

## 2018-07-08 DIAGNOSIS — E1169 Type 2 diabetes mellitus with other specified complication: Secondary | ICD-10-CM | POA: Diagnosis not present

## 2018-07-08 DIAGNOSIS — R4701 Aphasia: Secondary | ICD-10-CM

## 2018-07-08 NOTE — Progress Notes (Signed)
 *  Nursing Home visit Subjective:  Blake Wilson is a 67 y.o. male resident of [facility] who was seen for a routine continuity visit. He has no complaints and his nurse tells me he has been doing well with no concerns from her.   HPI: Blake Wilson was actually at breakfast during my assigned time with him so I was unable to do a full examination in his room.  I visited with him briefly while he ate after he said that was ok.  We discussed his health and he said he is doing good and doesn't need anything changed.  He says he feels like he can pretty much do all his activities without restriction.  Objective:  Physical Exam: BP 128/82   Pulse 82   Temp 97.8 F (36.6 C)  vitals are from last week (last recorded) Gen: NAD,eating comfortably CV: not examined Pulm: NWOB MSK: no edema, cyanosis, or clubbing noted Skin: warm, dry Neuro: grossly normal, moves upper extremities with appropriate coordination Psych: Normal affect and thought content  No results found for this or any previous visit (from the past 72 hour(s)).   Assessment/Plan:  Nursing home resident Patient doing well (self-reported and per nursing) with stable vitals and no complaints.  Unable to do full exam as at breakfast today but what was observed was appropriate and reassuring.   Sherene Sires, DO FAMILY MEDICINE RESIDENT - PGY2 07/08/2018 9:04 AM

## 2018-07-08 NOTE — Assessment & Plan Note (Signed)
Patient doing well (self-reported and per nursing) with stable vitals and no complaints.  Unable to do full exam as at breakfast today but what was observed was appropriate and reassuring.

## 2018-07-08 NOTE — Progress Notes (Signed)
I have interviewed and examined the patient.  I have discussed the case and verified the key findings with Dr. Nicki Reaper.   I agree with their assessments and plans as documented in their note for today.   Problem List Items Addressed This Visit      High   CVA, old, cognitive deficits, strategic infarct (Chronic) - Stable. No new numbness or weakness in limbs or face. - Ex aphasia persists but much improved.  Able to expressive three to four work sentneces clear enough to be understood without much interpretive effort.  - Transition with sit to stand still needs supervision for balance     Congestive dilated cardiomyopathy ([place]) - Stable - Weight stable.  No chest pain.  No DOE.  No orthopnea   Vascular dementia, Moderate Stage - Stable. - No behavioral issues.  - no antidementia meds.  Tx's CV rf      Medium   HYPERTENSION, BENIGN SYSTEMIC (Chronic) - Stable. Contolled. - SBP in 120-140, DBP in 70s. - no leg wounds.    Type 2 diabetes mellitus with diabetic polyneuropathy, without long-term current use of insulin (HCC) - Stable.  - Running in low 100s on no medications   Hyperlipidemia due to type 2 diabetes mellitus (HCC) - No RUQ pain on Lipitor 80.  No jaundice.       Unprioritized   Expressive aphasia - Improved.  - See CVA px.   Nursing home resident - Primary    Patient doing well (self-reported and per nursing) with stable vitals and no complaints.  Unable to do full exam as at breakfast today but what was observed was appropriate and reassuring.  Patient reports that there was community housing available but he declined it.

## 2018-07-08 NOTE — Addendum Note (Signed)
Addended byWendy Poet, Jonice Cerra D on: 07/08/2018 12:07 PM   Modules accepted: Level of Service

## 2018-07-30 ENCOUNTER — Encounter: Payer: Self-pay | Admitting: Internal Medicine

## 2018-07-30 ENCOUNTER — Non-Acute Institutional Stay (SKILLED_NURSING_FACILITY): Payer: Medicare Other | Admitting: Internal Medicine

## 2018-07-30 DIAGNOSIS — E1169 Type 2 diabetes mellitus with other specified complication: Secondary | ICD-10-CM | POA: Diagnosis not present

## 2018-07-30 DIAGNOSIS — E785 Hyperlipidemia, unspecified: Secondary | ICD-10-CM | POA: Diagnosis not present

## 2018-07-30 DIAGNOSIS — I10 Essential (primary) hypertension: Secondary | ICD-10-CM | POA: Diagnosis not present

## 2018-07-30 DIAGNOSIS — E1142 Type 2 diabetes mellitus with diabetic polyneuropathy: Secondary | ICD-10-CM | POA: Diagnosis not present

## 2018-07-30 NOTE — Assessment & Plan Note (Signed)
Update A1c as he is on no diabetic medications

## 2018-07-30 NOTE — Progress Notes (Signed)
NURSING HOME LOCATION:  [facility] ROOM NUMBER:  [id number]  CODE STATUS:  Full Code  PCP:  Hendricks Limes, MD  [place] [address]  This is a nursing facility follow of chronic medical diagnoses.    Interim medical record and care since last [place] visit was updated with review of diagnostic studies and change in clinical status since last visit were documented.  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  He received full dose aspirin and Plavix for 3 months and then Plavix alone.  He also was documented to have grade 1 diastolic dysfunction and diffuse hypokinesis .He has had insulin-dependent diabetes. Labs are not current.  His most recent A1c was 5.5% in December 2018.  He is on no diabetic medications.  Lipids were last checked in May 2018.  Remains on high-dose atorvastatin, 80 mg.  Review of systems: He categorically denies any active symptoms especially cardiopulmonary or endocrinologic.  He does admit to snoring but indicates that evaluation for obstructive sleep apnea was negative.  His comment is "I am moving". Constitutional: No fever, significant weight change, fatigue  Eyes: No redness, discharge, pain, vision change ENT/mouth: No nasal congestion,  purulent discharge, earache, change in hearing, sore throat  Cardiovascular: No chest pain, palpitations, paroxysmal nocturnal dyspnea, claudication, edema  Respiratory: No cough, sputum production, hemoptysis, DOE, apnea   Gastrointestinal: No heartburn, dysphagia, abdominal pain, nausea /vomiting, rectal bleeding, melena, change in bowels Genitourinary: No dysuria, hematuria, pyuria, incontinence, nocturia Musculoskeletal: No joint stiffness, joint swelling, weakness, pain Dermatologic: No rash, pruritus, change in  appearance of skin Neurologic: No dizziness, headache, syncope, seizures, numbness, tingling Psychiatric: No significant anxiety, depression, insomnia, anorexia Endocrine: No change in hair/skin/nails, excessive thirst, excessive hunger, excessive urination  Hematologic/lymphatic: No significant bruising, lymphadenopathy, abnormal bleeding Allergy/immunology: No itchy/watery eyes, significant sneezing, urticaria, angioedema  Physical exam:  Pertinent or positive findings: He was able to give the month and year as well as name of the president.  Speech is somewhat slurred.  He has slight arcus senilis.  Starleen Blue is present.  The maxilla is edentulous and is not wearing a plate.  He has multiple missing mandibular teeth.  Heart rate is slow.  Breath sounds are decreased.  Abdomen is protuberant.  He has circular hypopigmented scars over the dorsum of the right hand.  He is weak diffusely questionably greater in  left upper and left lower extremities than the right extremities.  General appearance: Adequately nourished; no acute distress, increased work of breathing is present.   Lymphatic: No lymphadenopathy about the head, neck, axilla. Eyes: No conjunctival inflammation or lid edema is present. There is no scleral icterus. Ears:  External ear exam shows no significant lesions or deformities.   Nose:  External nasal examination shows no deformity or inflammation. Nasal mucosa are pink and moist without lesions, exudates Oral exam:  Lips and gums are healthy appearing. There is no oropharyngeal erythema or exudate. Neck:  No thyromegaly, masses, tenderness noted.    Heart:  No gallop, murmur, click, rub .  Lungs: without wheezes, rhonchi, rales, rubs. Abdomen: Bowel sounds are normal. Abdomen is soft and nontender with no organomegaly, hernias, masses. GU: Deferred  Extremities:  No cyanosis, clubbing, edema  Neurologic exam : Balance, Rhomberg, finger to nose testing could not be completed due to  clinical state Skin:  Warm & dry w/o tenting. No significant lesions or rash.  See summary under each active problem in the Problem List with associated updated therapeutic plan

## 2018-07-30 NOTE — Assessment & Plan Note (Signed)
BP controlled; no change in antihypertensive medications  

## 2018-07-30 NOTE — Assessment & Plan Note (Addendum)
Fasting lipids and LFTs

## 2018-07-31 ENCOUNTER — Encounter: Payer: Self-pay | Admitting: Internal Medicine

## 2018-07-31 NOTE — Patient Instructions (Signed)
See assessment and plan under each diagnosis in the problem list and acutely for this visit 

## 2018-08-03 LAB — TSH: TSH: 2.57 (ref 0.41–5.90)

## 2018-08-03 LAB — CBC AND DIFFERENTIAL
HCT: 35 — AB (ref 41–53)
Hemoglobin: 11.8 — AB (ref 13.5–17.5)
NEUTROS ABS: 3
PLATELETS: 189 (ref 150–399)
WBC: 4.2

## 2018-08-06 LAB — VITAMIN D 25 HYDROXY (VIT D DEFICIENCY, FRACTURES): Vit D, 25-Hydroxy: 23

## 2018-08-17 ENCOUNTER — Other Ambulatory Visit: Payer: Self-pay | Admitting: Family Medicine

## 2018-08-17 NOTE — Progress Notes (Signed)
error 

## 2018-08-20 ENCOUNTER — Non-Acute Institutional Stay (SKILLED_NURSING_FACILITY): Payer: Medicare Other

## 2018-08-20 DIAGNOSIS — Z0000 Encounter for general adult medical examination without abnormal findings: Secondary | ICD-10-CM | POA: Diagnosis not present

## 2018-08-20 NOTE — Progress Notes (Addendum)
Subjective:   Blake Wilson is a 67 y.o. male who presents for an Initial Medicare Annual Wellness Visit.   Objective:    Today's Vitals   08/20/18 1119  BP: 123/75  Pulse: 75  Temp: 97.9 F (36.6 C)  TempSrc: Oral  Weight: 187 lb (84.8 kg)  Height: 6' (1.829 m)   Body mass index is 25.36 kg/m.  Advanced Directives 08/20/2018 01/02/2017 12/20/2016 12/20/2016 12/19/2016 12/19/2016 12/13/2016  Does Patient Have a Medical Advance Directive? Yes Yes No No Yes Yes No  Type of Advance Directive (No Data) (No Data) - - (No Data) (No Data) -  Does patient want to make changes to medical advance directive? No - Patient declined No - Patient declined No - Patient declined - No - Patient declined No - Patient declined -  Would patient like information on creating a medical advance directive? - No - Patient declined No - Patient declined - No - Patient declined No - Patient declined No - Patient declined    Current Medications (verified) Outpatient Encounter Medications as of 08/20/2018  Medication Sig  . atorvastatin (LIPITOR) 80 MG tablet Take 80 mg by mouth daily at 6 PM.  . bisacodyl (DULCOLAX) 10 MG suppository Place 10 mg rectally daily as needed for moderate constipation.  . carvedilol (COREG) 3.125 MG tablet Take 3.125 mg by mouth 2 (two) times daily with a meal.  . clopidogrel (PLAVIX) 75 MG tablet Take 75 mg by mouth daily.  Marland Kitchen gabapentin (NEURONTIN) 600 MG tablet Take 600 mg by mouth at bedtime.   Marland Kitchen losartan (COZAAR) 25 MG tablet Take 25 mg by mouth daily.  . nitroGLYCERIN (NITROSTAT) 0.4 MG SL tablet Place 1 tablet (0.4 mg total) under the tongue every 5 (five) minutes as needed for chest pain.  . promethazine (PHENERGAN) 25 MG/ML injection Inject 25 mg into the muscle every 6 (six) hours as needed for nausea or vomiting. Inject 25 mg into the muscle q6h as needed for nausea or vomiting for three doses  . travoprost, benzalkonium, (TRAVATAN) 0.004 % ophthalmic solution Place 1 drop  into both eyes at bedtime.   . [DISCONTINUED] sitaGLIPtin (JANUVIA) 100 MG tablet Take 1 tablet (100 mg total) by mouth daily.   No facility-administered encounter medications on file as of 08/20/2018.     Allergies (verified) Patient has no known allergies.   History: Past Medical History:  Diagnosis Date  . Abnormal EKG 12/16/2016   Inferior and anteroseptal Q wave infarction pattern  . Alcohol abuse 01/09/2009  . CHF (congestive heart failure) ([place])   . 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 Diabetes mellitus   . Discomfort of chest wall 10/26/2013  . Equinus deformity of foot, acquired 12/22/2013  . Failure to thrive in adult   . History of acute renal failure 12/20/2016  . Hyperkalemia   . Hyperlipidemia   . Hyperphosphatemia   . Left leg pain 10/29/2011   Et: ?diabetic peripheral neuropathy   Reported improvement with gabapentin in the past    . Low TSH level 06/13/2014  . Lupus ([place])   . Metatarsalgia of both feet 12/22/2013  . Mild CAD 2009  . Myocardial infarct, old    Per patient report.  Inferior and anteroseptal Q wave infarction  pattern on 12/16/16 EKG.  . Onychomycosis 12/22/2013  . Oropharyngeal dysphagia   . Pain in joint, ankle and foot 04/01/2014  . Pain in lower limb 12/22/2013  . S/P percutaneous endoscopic gastrostomy (PEG) tube placement ([place]) 03/14/2017  . SLE 12/18/2006   Diagnosed 1993. Presented with abdominal pain and rash. Took prednisone in the past but no medications now in remission.    . Urinary tract infection with hematuria    Past Surgical History:  Procedure Laterality Date  . IR GASTROSTOMY TUBE REMOVAL  08/26/2017  . IR GENERIC HISTORICAL  12/25/2016   IR GASTROSTOMY TUBE MOD SED  12/25/2016 Sandi Mariscal, MD [facility]-INTERV RAD  . TEE WITHOUT CARDIOVERSION N/A 12/17/2016   Procedure: TRANSESOPHAGEAL ECHOCARDIOGRAM (TEE);  Surgeon: Skeet Latch, MD;  Location: [facility] ENDOSCOPY;  Service: Cardiovascular;  Laterality: N/A;   Family History  Problem Relation Age of Onset  . Cirrhosis Brother   . Alcohol abuse Brother   . Stroke Maternal Grandmother   . Cancer Brother        throat cancer (smoker)   Social History   Socioeconomic History  . Marital status: Single    Spouse name: Not on file  . Number of children: Not on file  . Years of education: Not on file  . Highest education level: Not on file  Occupational History  . Not on file  Social Needs  . Financial resource strain: Not hard at all  . Food insecurity:    Worry: Never true    Inability: Never true  . Transportation needs:    Medical: No    Non-medical: No  Tobacco Use  . Smoking status: Former Smoker    Last attempt to quit: 01/23/2001    Years since quitting: 17.5  . Smokeless tobacco: Former Network engineer and Sexual Activity  . Alcohol use: No    Alcohol/week: 1.0 standard drinks    Types: 1 Cans of beer per week    Comment: hx alcohol abuse 2010  . Drug use: No  . Sexual activity: Not Currently    Birth control/protection: None  Lifestyle  . Physical activity:    Days per week: 0 days    Minutes per session: 0 min  . Stress: Not at all  Relationships  . Social connections:    Talks on phone: Never    Gets together: Once a week    Attends religious service: Never    Active member of club or organization: No    Attends meetings of clubs or organizations: Never    Relationship status: Never married  Other Topics Concern  . Not on file  Social History Narrative   Living at [facility] at this time for therapy.  Vilinda Flake, [place].     Tobacco Counseling Counseling given: Not Answered   Clinical Intake:  Pre-visit preparation completed: No  Pain : No/denies pain      Diabetes: No  How often do you need to have someone help you when you read instructions, pamphlets, or other written materials from your doctor or pharmacy?: 3 - Sometimes What is the last grade level you completed in school?: Some College  Interpreter Needed?: No  Information entered by :: Tyson Dense, RN  Activities of Daily Living In your present state of health, do you have any difficulty performing the following activities: 08/20/2018  Hearing? N  Vision? N  Difficulty concentrating or making decisions? Y  Walking or climbing stairs? Y  Dressing or bathing? Y  Doing  errands, shopping? Y  Preparing Food and eating ? Y  Using the Toilet? Y  In the past six months, have you accidently leaked urine? N  Do you have problems with loss of bowel control? N  Managing your Medications? Y  Managing your Finances? Y  Housekeeping or managing your Housekeeping? Y  Some recent data might be hidden     Immunizations and Health Maintenance Immunization History  Administered Date(s) Administered  . Influenza Split 10/29/2011, 10/07/2012  . Influenza Whole 09/02/2007, 08/15/2009, 09/28/2010  . Influenza,inj,Quad PF,6+ Mos 10/25/2013, 09/21/2014, 11/05/2016  . Influenza-Unspecified 08/18/2017  . Pneumococcal Conjugate-13 01/26/2016  . Td 01/17/2009   Health Maintenance Due  Topic Date Due  . OPHTHALMOLOGY EXAM  11/30/2016  . PNA vac Low Risk Adult (2 of 2 - PPSV23) 01/25/2017  . FOOT EXAM  12/06/2017  . HEMOGLOBIN A1C  03/23/2018  . INFLUENZA VACCINE  05/21/2018    Patient Care Team: Hendricks Limes, MD as PCP - General (Internal Medicine) Kennith Center, RD as Dietitian (Family Medicine)  Indicate any recent Medical Services you may have received from other than [facility] providers in the past year (date may be approximate).    Assessment:   This is a routine wellness examination for [place].  Hearing/Vision screen No exam data present  Dietary issues and exercise  activities discussed: Current Exercise Habits: The patient does not participate in regular exercise at present, Exercise limited by: orthopedic condition(s)  Goals   None    Depression Screen PHQ 2/9 Scores 08/20/2018 12/06/2016 11/05/2016 01/26/2016  PHQ - 2 Score 6 0 0 0  PHQ- 9 Score 9 - - -    Fall Risk Fall Risk  08/20/2018 11/11/2017 12/06/2016 11/05/2016 01/26/2016  Falls in the past year? No No No No No    Is the patient's home free of loose throw rugs in walkways, pet beds, electrical cords, etc?   yes      Grab bars in the bathroom? yes      Handrails on the stairs?   yes      Adequate lighting?   yes  Cognitive Function:     6CIT Screen 08/20/2018  What Year? 4 points  What month? 0 points  What time? 0 points  Count back from 20 0 points  Months in reverse 4 points  Repeat phrase 10 points  Total Score 18    Screening Tests Health Maintenance  Topic Date Due  . OPHTHALMOLOGY EXAM  11/30/2016  . PNA vac Low Risk Adult (2 of 2 - PPSV23) 01/25/2017  . FOOT EXAM  12/06/2017  . HEMOGLOBIN A1C  03/23/2018  . INFLUENZA VACCINE  05/21/2018  . TETANUS/TDAP  01/18/2019  . COLONOSCOPY  10/29/2019  . Hepatitis C Screening  Completed    Qualifies for Shingles Vaccine? Not in past records  Cancer Screenings: Lung: Low Dose CT Chest recommended if Age 23-80 years, 30 pack-year currently smoking OR have quit w/in 15years. Patient does not qualify. Colorectal: due, ordered  Additional Screenings:  Hepatitis C Screening: declined Pneumovax due: ordered Diabetic eye exam due: ordered Flu vaccine due: will receive at [facility]:    I have personally reviewed and addressed the Medicare Annual Wellness questionnaire and have noted the following in the patient's chart:  A. Medical and social history B. Use of alcohol, tobacco or illicit drugs  C. Current medications and supplements D. Functional ability and status E.  Nutritional status F.  Physical  activity  G. Advance directives H. List of other physicians I.  Hospitalizations, surgeries, and ER visits in previous 12 months J.  [place] to include hearing, vision, cognitive, depression L. Referrals and appointments - none  In addition, I have reviewed and discussed with patient certain preventive protocols, quality metrics, and best practice recommendations. A written personalized care plan for preventive services as well as general preventive health recommendations were provided to patient.  See attached scanned questionnaire for additional information.   Signed,   Tyson Dense, RN Nurse Health Advisor  Patient Concerns: None  I have personally reviewed the health advisor's clinical note, was available for consultation, and agree with the assessment and plan as written. Hendricks Limes M.D., FACP, [facility]

## 2018-08-20 NOTE — Patient Instructions (Signed)
[name] , I have completed the annual wellness visit as per Medicare guidelines. The attached information is provided for the patient's family, care providers, and facility of residence.  Screening recommendations/referrals: Colonoscopy up to date Recommended yearly ophthalmology/optometry visit for glaucoma screening and checkup Recommended yearly dental visit for hygiene and checkup  Vaccinations: Influenza vaccine due, will receive at [facility] Pneumococcal vaccine 23 dur, ordered Tdap vaccine up to date, due 01/18/2019 Shingles vaccine not in past records    Advanced directives: in chart  Conditions/risks identified: Fall Risk  Next appointment: Dr. Linna Darner makes rounds  Preventive Care 67 Years and Older, Male Preventive care refers to lifestyle choices and visits with your health care provider that can promote health and wellness. What does preventive care include?  A yearly physical exam. This is also called an annual well check.  Dental exams once or twice a year.  Routine eye exams. Ask your health care provider how often you should have your eyes checked.  Personal lifestyle choices, including:  Daily care of your teeth and gums.  Regular physical activity.  Eating a healthy diet.  Avoiding tobacco and drug use.  Limiting alcohol use.  Taking vitamin and mineral supplements as recommended by your health care provider. What happens during an annual well check? The services and screenings done by your health care provider during your annual well check will depend on your age, overall health, lifestyle risk factors, and family history of disease. Counseling  Your health care provider may ask you questions about your:  Alcohol use.  Tobacco use.  Drug use.  Emotional well-being.  Home and relationship well-being.  Sexual activity.  Eating habits.  History of falls.  Memory and ability to understand (cognition).  Work and work  Statistician. Screening  You may have the following tests or measurements:  Height, weight, and BMI.  Blood pressure.  Lipid and cholesterol levels. These may be checked every 5 years, or more frequently if you are over 38 years old.  Skin check.  Lung cancer screening. You may have this screening every year starting at age 19 if you have a 30-pack-year history of smoking and currently smoke or have quit within the past 15 years.  Fecal occult blood test (FOBT) of the stool. You may have this test every year starting at age 42.  Flexible sigmoidoscopy or colonoscopy. You may have a sigmoidoscopy every 5 years or a colonoscopy every 10 years starting at age 93.  Prostate cancer screening. Recommendations will vary depending on your family history and other risks.  Hepatitis C blood test.  Hepatitis B blood test.  Diabetes screening. This is done by checking your blood sugar (glucose) after you have not eaten for a while (fasting). You may have this done every 1-3 years.  Abdominal aortic aneurysm (AAA) screening. You may need this if you are a current or former smoker.  Osteoporosis. You may be screened starting at age 83 if you are at high risk. Talk with your health care provider about your test results, treatment options, and if necessary, the need for more tests. Vaccines  Your health care provider may recommend certain vaccines, such as:  Influenza vaccine. This is recommended every year.  Tetanus, diphtheria, and acellular pertussis (Tdap, Td) vaccine. You may need a Td booster every 10 years.  Zoster vaccine. You may need this after age 13.  Pneumococcal 13-valent conjugate (PCV13) vaccine. One dose is recommended after age 38.  Pneumococcal polysaccharide (PPSV23) vaccine. One dose is  recommended after age 24. Talk to your health care provider about which screenings and vaccines you need and how often you need them. This information is not intended to replace advice  given to you by your health care provider. Make sure you discuss any questions you have with your health care provider. Document Released: 11/03/2015 Document Revised: 06/26/2016 Document Reviewed: 08/08/2015 Elsevier Interactive Patient Education  2017 [place] can cause injuries. They can happen to people of all ages. There are many things you can do to make your home safe and to help prevent falls.  What can I do in the bathroom?  Use night lights.  Install grab bars by the toilet and in the tub and shower. Do not use towel bars as grab bars.  Use non-skid mats or decals in the tub or shower.  If you need to sit down in the shower, use a plastic, non-slip stool.  Keep the floor dry. Clean up any water that spills on the floor as soon as it happens.  Remove soap buildup in the tub or shower regularly.  Attach bath mats securely with double-sided non-slip rug tape.  Do not have throw rugs and other things on the floor that can make you trip. What can I do in the bedroom?  Use night lights.  Make sure that you have a light by your bed that is easy to reach.  Do not use any sheets or blankets that are too big for your bed. They should not hang down onto the floor.  Have a firm chair that has side arms. You can use this for support while you get dressed.  Do not have throw rugs and other things on the floor that can make you trip. What else can I do to help prevent falls?  Wear shoes that:  Do not have high heels.  Have rubber bottoms.  Are comfortable and fit you well.  Are closed at the toe. Do not wear sandals.  If you use a stepladder:  Make sure that it is fully opened. Do not climb a closed stepladder.  Make sure that both sides of the stepladder are locked into place.  Ask someone to hold it for you, if possible.  Clearly mark and make sure that you can see:  Any grab bars or handrails.  First and last steps.  Where the  edge of each step is.  Use tools that help you move around (mobility aids) if they are needed. These include:  Canes.  Walkers.  Scooters.  Crutches.  Turn on the lights when you go into a dark area. Replace any light bulbs as soon as they burn out.  Set up your furniture so you have a clear path. Avoid moving your furniture around.  If any of your floors are uneven, fix them.  Review your medicines with your doctor. Some medicines can make you feel dizzy. This can increase your chance of falling. Ask your doctor what other things that you can do to help prevent falls. This information is not intended to replace advice given to you by your health care provider. Make sure you discuss any questions you have with your health care provider. Document Released: 08/03/2009 Document Revised: 03/14/2016 Document Reviewed: 11/11/2014 Elsevier Interactive Patient Education  2017 [employer].

## 2018-09-02 ENCOUNTER — Encounter: Payer: Self-pay | Admitting: Internal Medicine

## 2018-09-22 ENCOUNTER — Other Ambulatory Visit: Payer: Self-pay

## 2018-09-22 ENCOUNTER — Emergency Department (HOSPITAL_COMMUNITY): Payer: Medicare Other

## 2018-09-22 ENCOUNTER — Emergency Department (HOSPITAL_COMMUNITY)
Admission: EM | Admit: 2018-09-22 | Discharge: 2018-09-22 | Disposition: A | Discharge status: Skilled Nursing Facility | Payer: Medicare Other | Attending: Emergency Medicine | Admitting: Emergency Medicine

## 2018-09-22 DIAGNOSIS — Z79899 Other long term (current) drug therapy: Secondary | ICD-10-CM | POA: Diagnosis not present

## 2018-09-22 DIAGNOSIS — Z043 Encounter for examination and observation following other accident: Secondary | ICD-10-CM | POA: Insufficient documentation

## 2018-09-22 DIAGNOSIS — Z87891 Personal history of nicotine dependence: Secondary | ICD-10-CM | POA: Diagnosis not present

## 2018-09-22 DIAGNOSIS — I509 Heart failure, unspecified: Secondary | ICD-10-CM | POA: Insufficient documentation

## 2018-09-22 DIAGNOSIS — I110 Hypertensive heart disease with heart failure: Secondary | ICD-10-CM | POA: Insufficient documentation

## 2018-09-22 DIAGNOSIS — R05 Cough: Secondary | ICD-10-CM

## 2018-09-22 DIAGNOSIS — E1143 Type 2 diabetes mellitus with diabetic autonomic (poly)neuropathy: Secondary | ICD-10-CM | POA: Insufficient documentation

## 2018-09-22 DIAGNOSIS — R509 Fever, unspecified: Secondary | ICD-10-CM

## 2018-09-22 DIAGNOSIS — W010XXA Fall on same level from slipping, tripping and stumbling without subsequent striking against object, initial encounter: Secondary | ICD-10-CM

## 2018-09-22 DIAGNOSIS — R059 Cough, unspecified: Secondary | ICD-10-CM

## 2018-09-22 LAB — BASIC METABOLIC PANEL
Anion gap: 7 (ref 5–15)
BUN: 14 mg/dL (ref 8–23)
CO2: 26 mmol/L (ref 22–32)
Calcium: 8.5 mg/dL — ABNORMAL LOW (ref 8.9–10.3)
Chloride: 106 mmol/L (ref 98–111)
Creatinine, Ser: 1.2 mg/dL (ref 0.61–1.24)
GFR calc Af Amer: >60 mL/min (ref >60)
GFR calc non Af Amer: >60 mL/min (ref >60)
Glucose, Bld: 132 mg/dL — ABNORMAL HIGH (ref 70–99)
POTASSIUM: 3.7 mmol/L (ref 3.5–5.1)
Sodium: 139 mmol/L (ref 135–145)

## 2018-09-22 LAB — CBC
HEMATOCRIT: 38 % — AB (ref 39.0–52.0)
Hemoglobin: 12 g/dL — ABNORMAL LOW (ref 13.0–17.0)
MCH: 29.2 pg (ref 26.0–34.0)
MCHC: 31.6 g/dL (ref 30.0–36.0)
MCV: 92.5 fL (ref 80.0–100.0)
Platelets: 200 10*3/uL (ref 150–400)
RBC: 4.11 MIL/uL — ABNORMAL LOW (ref 4.22–5.81)
RDW: 13.7 % (ref 11.5–15.5)
WBC: 3.7 10*3/uL — ABNORMAL LOW (ref 4.0–10.5)
nRBC: 0 % (ref 0.0–0.2)

## 2018-09-22 LAB — I-STAT CG4 LACTIC ACID, ED: Lactic Acid, Venous: 1.63 mmol/L (ref 0.5–1.9)

## 2018-09-22 MED ORDER — GUAIFENESIN 100 MG/5ML PO SOLN
10.0000 mL | Freq: Once | ORAL | Status: AC
  Administered 2018-09-22: 200 mg via ORAL
  Filled 2018-09-22: qty 10

## 2018-09-22 MED ORDER — DOXYCYCLINE HYCLATE 100 MG PO CAPS: 100.0000 mg | ORAL_CAPSULE | Freq: Two times a day (BID) | ORAL | 0 refills | Status: DC

## 2018-09-22 MED ORDER — DOXYCYCLINE HYCLATE 100 MG PO TABS
100.0000 mg | ORAL_TABLET | Freq: Once | ORAL | Status: AC
  Administered 2018-09-22: 100 mg via ORAL
  Filled 2018-09-22: qty 1

## 2018-09-22 MED ORDER — ACETAMINOPHEN 500 MG PO TABS
1000.0000 mg | ORAL_TABLET | Freq: Once | ORAL | Status: AC
  Administered 2018-09-22: 1000 mg via ORAL
  Filled 2018-09-22: qty 2

## 2018-09-22 NOTE — Discharge Instructions (Addendum)
It was our pleasure to provide your ER care today - we hope that you feel better.  Take antibiotic as prescribed.  Take acetaminophen and/or ibuprofen as need for fever. You may try robitussin or mucinex as need for cough.  Fall precautions.   Follow up with primary care doctor in 1 week if symptoms fail to improve/resolve.  Return to ER if worse, new symptoms, increased trouble breathing, other concern.

## 2018-09-22 NOTE — ED Notes (Signed)
[facility] picked up pt to transport back to facility

## 2018-09-22 NOTE — ED Triage Notes (Signed)
Patient presented to ed with c/o fever. Patient was found on the floor and slip to the floor. It was unwitnessed and no complain of any injury. Pt been having a fever for about week per staff. Pt state his only complain is cough and fever. Pt denies hitting his head  or LOC.

## 2018-09-22 NOTE — ED Notes (Signed)
Bed: WA07 Expected date:  Expected time:  Means of arrival:  Comments: EMS 67yo male fall from facility- bed to wheelchair fall- low grade fever

## 2018-09-22 NOTE — ED Notes (Signed)
[facility] call for transportation

## 2018-09-22 NOTE — ED Notes (Signed)
Patient transported to X-ray 

## 2018-09-22 NOTE — ED Provider Notes (Addendum)
[facility] DEPT Provider Note   CSN: [id number] Arrival date & time: 09/22/18  6314     History   Chief Complaint Chief Complaint  Patient presents with  . Fever    HPI Blake Wilson is a 66 y.o. male.  Patient presents via ems from ecf, where this AM pt was noted to slip transferring from chair to bed. No loc. No trauma, pain or injury associated with that event. ECF staff also report pt with fever the past couple days with increased cough. Pt notes intermittent productive cough. Denies chest pain or discomfort. No sob. No sore throat or runny nose. Denies abd pain or nvd. No dysuria or gu c/o. No headache.   The history is provided by the patient, the EMS personnel and the nursing home.    Past Medical History:  Diagnosis Date  . Abnormal EKG 12/16/2016   Inferior and anteroseptal Q wave infarction pattern  . Alcohol abuse 01/09/2009  . CHF (congestive heart failure) ([place])   . 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 Diabetes mellitus   . Discomfort of chest wall 10/26/2013  . Equinus deformity of foot, acquired 12/22/2013  . Failure to thrive in adult   . History of acute renal failure 12/20/2016  . Hyperkalemia   . Hyperlipidemia   . Hyperphosphatemia   . Left leg pain 10/29/2011   Et: ?diabetic peripheral neuropathy   Reported improvement with gabapentin in the past    . Low TSH level 06/13/2014  . Lupus ([place])   . Metatarsalgia of both feet 12/22/2013  . Mild CAD 2009  . Myocardial infarct, old    Per patient report.  Inferior and anteroseptal Q wave infarction pattern on 12/16/16 EKG.  . Onychomycosis 12/22/2013  . Oropharyngeal dysphagia   . Pain in joint, ankle and  foot 04/01/2014  . Pain in lower limb 12/22/2013  . S/P percutaneous endoscopic gastrostomy (PEG) tube placement ([place]) 03/14/2017  . SLE 12/18/2006   Diagnosed 1993. Presented with abdominal pain and rash. Took prednisone in the past but no medications now in remission.    . Urinary tract infection with hematuria     Patient Active Problem List   Diagnosis Date Noted  . Myocardial infarct, old   . Vascular dementia, Moderate Stage 12/31/2017  . Nursing home resident 01/02/2017  . Expressive aphasia 12/16/2016  . CVA, old, cognitive deficits, strategic infarct   . Chronic systolic heart failure ([place]) 04/01/2014  . Diabetic neuropathy, painful ([place]) 12/22/2013  . GOUT, UNSPECIFIED 06/26/2010  . Type 2 diabetes mellitus with diabetic polyneuropathy, without long-term current use of insulin ([place]) 06/29/2009  . Unspecified glaucoma 04/17/2009  . Hyperlipidemia due to type 2 diabetes mellitus ([place]) 03/02/2009  . Coronary atherosclerosis 03/02/2009  . OBESITY 01/17/2009  . Congestive dilated cardiomyopathy ([place]) 01/09/2009  . HYPERTENSION, BENIGN SYSTEMIC 12/18/2006    Past Surgical History:  Procedure Laterality Date  . IR GASTROSTOMY TUBE REMOVAL  08/26/2017  . IR GENERIC HISTORICAL  12/25/2016   IR GASTROSTOMY TUBE MOD SED 12/25/2016 Sandi Mariscal, MD [facility]-INTERV RAD  . TEE WITHOUT CARDIOVERSION N/A 12/17/2016   Procedure: TRANSESOPHAGEAL ECHOCARDIOGRAM (TEE);  Surgeon: Skeet Latch, MD;  Location: [facility];  Service: Cardiovascular;  Laterality: N/A;  Home Medications    Prior to Admission medications   Medication Sig Start Date End Date Taking? Authorizing Provider  atorvastatin (LIPITOR) 80 MG tablet Take 80 mg by mouth daily at 6 PM.    [provider]  bisacodyl (DULCOLAX) 10 MG suppository Place 10 mg rectally daily as needed for moderate constipation.    [provider]  carvedilol (COREG) 3.125 MG tablet Take 3.125 mg by mouth 2 (two) times daily with  a meal.    [provider]  clopidogrel (PLAVIX) 75 MG tablet Take 75 mg by mouth daily.    [provider]  gabapentin (NEURONTIN) 600 MG tablet Take 600 mg by mouth at bedtime.     [provider]  losartan (COZAAR) 25 MG tablet Take 25 mg by mouth daily.    [provider]  nitroGLYCERIN (NITROSTAT) 0.4 MG SL tablet Place 1 tablet (0.4 mg total) under the tongue every 5 (five) minutes as needed for chest pain. 01/26/16   Smiley Houseman, MD  promethazine (PHENERGAN) 25 MG/ML injection Inject 25 mg into the muscle every 6 (six) hours as needed for nausea or vomiting. Inject 25 mg into the muscle q6h as needed for nausea or vomiting for three doses    [provider]  travoprost, benzalkonium, (TRAVATAN) 0.004 % ophthalmic solution Place 1 drop into both eyes at bedtime.     [provider]  sitaGLIPtin (JANUVIA) 100 MG tablet Take 1 tablet (100 mg total) by mouth daily. 10/29/11 01/08/12  Carolin Guernsey, MD    Family History Family History  Problem Relation Age of Onset  . Cirrhosis Brother   . Alcohol abuse Brother   . Stroke Maternal Grandmother   . Cancer Brother        throat cancer (smoker)    Social History Social History   Tobacco Use  . Smoking status: Former Smoker    Last attempt to quit: 01/23/2001    Years since quitting: 17.6  . Smokeless tobacco: Former Network engineer Use Topics  . Alcohol use: No    Alcohol/week: 1.0 standard drinks    Types: 1 Cans of beer per week    Comment: hx alcohol abuse 2010  . Drug use: No     Allergies   Patient has no known allergies.   Review of Systems Review of Systems  Constitutional: Positive for fever.  HENT: Negative for sore throat.   Eyes: Negative for redness.  Respiratory: Positive for cough. Negative for shortness of breath.   Cardiovascular: Negative for chest pain.  Gastrointestinal: Negative for abdominal pain, diarrhea and vomiting.  Endocrine: Negative  for polyuria.  Genitourinary: Negative for dysuria and flank pain.  Musculoskeletal: Negative for back pain and neck pain.  Skin: Negative for rash.  Neurological: Negative for headaches.  Hematological: Does not bruise/bleed easily.  Psychiatric/Behavioral: Negative for confusion.     Physical Exam Updated Vital Signs BP (!) 151/66   Pulse 81   Temp (!) 100.6 F (38.1 C) (Rectal)   Resp 18   SpO2 98%   Physical Exam  Constitutional: He appears well-developed and well-nourished. No distress.  HENT:  Head: Atraumatic.  Mouth/Throat: Oropharynx is clear and moist.  Eyes: Pupils are equal, round, and reactive to light. Conjunctivae are normal.  Neck: Normal range of motion. Neck supple. No tracheal deviation present. No thyromegaly present.  No stiffness or rigidity.  Cardiovascular: Normal rate, regular rhythm, normal heart sounds and intact distal pulses. Exam reveals  no gallop and no friction rub.  No murmur heard. Pulmonary/Chest: Effort normal. No accessory muscle usage. No respiratory distress. He has no wheezes.  Rhonchi left base. Coughing.   Abdominal: Soft. Bowel sounds are normal. He exhibits no distension and no mass. There is no tenderness. There is no guarding.  Genitourinary:  Genitourinary Comments: No cva tenderness.   Musculoskeletal: He exhibits no edema or tenderness.  CTLS spine, non tender, aligned, no step off. Good rom bil ext, no focal bony tenderness.  Lymphadenopathy:    He has no cervical adenopathy.  Neurological: He is alert.  Awake and alert. Motor/sens grossly intact bil.   Skin: Skin is warm and dry. No rash noted.  Psychiatric: He has a normal mood and affect.  Nursing note and vitals reviewed.    ED Treatments / Results  Labs (all labs ordered are listed, but only abnormal results are displayed) Results for orders placed or performed during the hospital encounter of 09/22/18  CBC  Result Value Ref Range   WBC 3.7 (L) 4.0 - 10.5 K/uL    RBC 4.11 (L) 4.22 - 5.81 MIL/uL   Hemoglobin 12.0 (L) 13.0 - 17.0 g/dL   HCT 38.0 (L) 39.0 - 52.0 %   MCV 92.5 80.0 - 100.0 fL   MCH 29.2 26.0 - 34.0 pg   MCHC 31.6 30.0 - 36.0 g/dL   RDW 13.7 11.5 - 15.5 %   Platelets 200 150 - 400 K/uL   nRBC 0.0 0.0 - 0.2 %  Basic metabolic panel  Result Value Ref Range   Sodium 139 135 - 145 mmol/L   Potassium 3.7 3.5 - 5.1 mmol/L   Chloride 106 98 - 111 mmol/L   CO2 26 22 - 32 mmol/L   Glucose, Bld 132 (H) 70 - 99 mg/dL   BUN 14 8 - 23 mg/dL   Creatinine, Ser 1.20 0.61 - 1.24 mg/dL   Calcium 8.5 (L) 8.9 - 10.3 mg/dL   GFR calc non Af Amer >60 >60 mL/min   GFR calc Af Amer >60 >60 mL/min   Anion gap 7 5 - 15  I-Stat CG4 Lactic Acid, ED  Result Value Ref Range   Lactic Acid, Venous 1.63 0.5 - 1.9 mmol/L   Dg Chest 2 View  Result Date: 09/22/2018 CLINICAL DATA:  Patient presented to ed with c/o fever. Patient was found on the floor and slip to the floor. It was unwitnessed and no complain of any injury. Pt been having a fever for about week per staff. Pt state his only complain is cough .*comment was truncated* EXAM: CHEST - 2 VIEW COMPARISON:  06/24/2007 FINDINGS: Heart size is UPPER normal. The lungs are free of focal consolidations and pleural effusions. No pneumothorax or displaced fractures. There is moderate degenerative change in the thoracolumbar spine. IMPRESSION: No evidence for acute cardiopulmonary abnormality. Electronically Signed   By: Nolon Nations M.D.   On: 09/22/2018 10:21   EKG None  Radiology Dg Chest 2 View  Result Date: 09/22/2018 CLINICAL DATA:  Patient presented to ed with c/o fever. Patient was found on the floor and slip to the floor. It was unwitnessed and no complain of any injury. Pt been having a fever for about week per staff. Pt state his only complain is cough .*comment was truncated* EXAM: CHEST - 2 VIEW COMPARISON:  06/24/2007 FINDINGS: Heart size is UPPER normal. The lungs are free of focal  consolidations and pleural effusions. No pneumothorax or displaced fractures. There is moderate  degenerative change in the thoracolumbar spine. IMPRESSION: No evidence for acute cardiopulmonary abnormality. Electronically Signed   By: Nolon Nations M.D.   On: 09/22/2018 10:21    Procedures Procedures (including critical care time)  Medications Ordered in ED Medications - No data to display   Initial Impression / Assessment and Plan / ED Course  I have reviewed the triage vital signs and the nursing notes.  Pertinent labs & imaging results that were available during my care of the patient were reviewed by me and considered in my medical decision making (see chart for details).  Labs and imaging ordered.  Reviewed nursing notes and prior charts for additional history.   cxr reviewed - no pna noted on cxr.   Labs reviewed - chem normal.  Pt with prod cough in ED, few rhonchi left base ?possible early pna. Doxycycline po. Acetaminophen po for fever.   rx for home.  Recheck, breathing comfortably. Pt currently appears stable for d/c.   Return precautions provided.   Final Clinical Impressions(s) / ED Diagnoses   Final diagnoses:  None    ED Discharge Orders    None         Lajean Saver, MD 09/22/18 1213

## 2018-10-21 DIAGNOSIS — R2681 Unsteadiness on feet: Secondary | ICD-10-CM | POA: Diagnosis not present

## 2018-10-21 DIAGNOSIS — G8191 Hemiplegia, unspecified affecting right dominant side: Secondary | ICD-10-CM | POA: Diagnosis not present

## 2018-10-21 DIAGNOSIS — M6281 Muscle weakness (generalized): Secondary | ICD-10-CM | POA: Diagnosis not present

## 2018-10-21 DIAGNOSIS — R278 Other lack of coordination: Secondary | ICD-10-CM | POA: Diagnosis not present

## 2018-10-22 DIAGNOSIS — M6281 Muscle weakness (generalized): Secondary | ICD-10-CM | POA: Diagnosis not present

## 2018-10-22 DIAGNOSIS — R278 Other lack of coordination: Secondary | ICD-10-CM | POA: Diagnosis not present

## 2018-10-22 DIAGNOSIS — R2681 Unsteadiness on feet: Secondary | ICD-10-CM | POA: Diagnosis not present

## 2018-10-22 DIAGNOSIS — G8191 Hemiplegia, unspecified affecting right dominant side: Secondary | ICD-10-CM | POA: Diagnosis not present

## 2018-10-26 DIAGNOSIS — R278 Other lack of coordination: Secondary | ICD-10-CM | POA: Diagnosis not present

## 2018-10-26 DIAGNOSIS — G8191 Hemiplegia, unspecified affecting right dominant side: Secondary | ICD-10-CM | POA: Diagnosis not present

## 2018-10-26 DIAGNOSIS — R2681 Unsteadiness on feet: Secondary | ICD-10-CM | POA: Diagnosis not present

## 2018-10-26 DIAGNOSIS — M6281 Muscle weakness (generalized): Secondary | ICD-10-CM | POA: Diagnosis not present

## 2018-10-27 DIAGNOSIS — R278 Other lack of coordination: Secondary | ICD-10-CM | POA: Diagnosis not present

## 2018-10-27 DIAGNOSIS — M6281 Muscle weakness (generalized): Secondary | ICD-10-CM | POA: Diagnosis not present

## 2018-10-27 DIAGNOSIS — R2681 Unsteadiness on feet: Secondary | ICD-10-CM | POA: Diagnosis not present

## 2018-10-27 DIAGNOSIS — G8191 Hemiplegia, unspecified affecting right dominant side: Secondary | ICD-10-CM | POA: Diagnosis not present

## 2018-10-28 DIAGNOSIS — R278 Other lack of coordination: Secondary | ICD-10-CM | POA: Diagnosis not present

## 2018-10-28 DIAGNOSIS — I69351 Hemiplegia and hemiparesis following cerebral infarction affecting right dominant side: Secondary | ICD-10-CM | POA: Diagnosis not present

## 2018-10-28 DIAGNOSIS — I5022 Chronic systolic (congestive) heart failure: Secondary | ICD-10-CM | POA: Diagnosis not present

## 2018-10-28 DIAGNOSIS — I2510 Atherosclerotic heart disease of native coronary artery without angina pectoris: Secondary | ICD-10-CM | POA: Diagnosis not present

## 2018-10-28 DIAGNOSIS — R2681 Unsteadiness on feet: Secondary | ICD-10-CM | POA: Diagnosis not present

## 2018-10-28 DIAGNOSIS — G8191 Hemiplegia, unspecified affecting right dominant side: Secondary | ICD-10-CM | POA: Diagnosis not present

## 2018-10-28 DIAGNOSIS — M6281 Muscle weakness (generalized): Secondary | ICD-10-CM | POA: Diagnosis not present

## 2018-10-29 DIAGNOSIS — R2681 Unsteadiness on feet: Secondary | ICD-10-CM | POA: Diagnosis not present

## 2018-10-29 DIAGNOSIS — G8191 Hemiplegia, unspecified affecting right dominant side: Secondary | ICD-10-CM | POA: Diagnosis not present

## 2018-10-29 DIAGNOSIS — R278 Other lack of coordination: Secondary | ICD-10-CM | POA: Diagnosis not present

## 2018-10-29 DIAGNOSIS — M6281 Muscle weakness (generalized): Secondary | ICD-10-CM | POA: Diagnosis not present

## 2018-10-30 DIAGNOSIS — R278 Other lack of coordination: Secondary | ICD-10-CM | POA: Diagnosis not present

## 2018-10-30 DIAGNOSIS — R2681 Unsteadiness on feet: Secondary | ICD-10-CM | POA: Diagnosis not present

## 2018-10-30 DIAGNOSIS — M6281 Muscle weakness (generalized): Secondary | ICD-10-CM | POA: Diagnosis not present

## 2018-10-30 DIAGNOSIS — G8191 Hemiplegia, unspecified affecting right dominant side: Secondary | ICD-10-CM | POA: Diagnosis not present

## 2018-11-02 DIAGNOSIS — M6281 Muscle weakness (generalized): Secondary | ICD-10-CM | POA: Diagnosis not present

## 2018-11-02 DIAGNOSIS — R2681 Unsteadiness on feet: Secondary | ICD-10-CM | POA: Diagnosis not present

## 2018-11-02 DIAGNOSIS — R278 Other lack of coordination: Secondary | ICD-10-CM | POA: Diagnosis not present

## 2018-11-02 DIAGNOSIS — G8191 Hemiplegia, unspecified affecting right dominant side: Secondary | ICD-10-CM | POA: Diagnosis not present

## 2018-11-03 DIAGNOSIS — R278 Other lack of coordination: Secondary | ICD-10-CM | POA: Diagnosis not present

## 2018-11-03 DIAGNOSIS — M6281 Muscle weakness (generalized): Secondary | ICD-10-CM | POA: Diagnosis not present

## 2018-11-03 DIAGNOSIS — R2681 Unsteadiness on feet: Secondary | ICD-10-CM | POA: Diagnosis not present

## 2018-11-03 DIAGNOSIS — G8191 Hemiplegia, unspecified affecting right dominant side: Secondary | ICD-10-CM | POA: Diagnosis not present

## 2018-11-04 DIAGNOSIS — R278 Other lack of coordination: Secondary | ICD-10-CM | POA: Diagnosis not present

## 2018-11-04 DIAGNOSIS — G8191 Hemiplegia, unspecified affecting right dominant side: Secondary | ICD-10-CM | POA: Diagnosis not present

## 2018-11-04 DIAGNOSIS — M6281 Muscle weakness (generalized): Secondary | ICD-10-CM | POA: Diagnosis not present

## 2018-11-04 DIAGNOSIS — R2681 Unsteadiness on feet: Secondary | ICD-10-CM | POA: Diagnosis not present

## 2018-11-05 DIAGNOSIS — R278 Other lack of coordination: Secondary | ICD-10-CM | POA: Diagnosis not present

## 2018-11-05 DIAGNOSIS — G8191 Hemiplegia, unspecified affecting right dominant side: Secondary | ICD-10-CM | POA: Diagnosis not present

## 2018-11-05 DIAGNOSIS — M6281 Muscle weakness (generalized): Secondary | ICD-10-CM | POA: Diagnosis not present

## 2018-11-05 DIAGNOSIS — R2681 Unsteadiness on feet: Secondary | ICD-10-CM | POA: Diagnosis not present

## 2018-11-06 DIAGNOSIS — R2681 Unsteadiness on feet: Secondary | ICD-10-CM | POA: Diagnosis not present

## 2018-11-06 DIAGNOSIS — M6281 Muscle weakness (generalized): Secondary | ICD-10-CM | POA: Diagnosis not present

## 2018-11-06 DIAGNOSIS — R278 Other lack of coordination: Secondary | ICD-10-CM | POA: Diagnosis not present

## 2018-11-06 DIAGNOSIS — G8191 Hemiplegia, unspecified affecting right dominant side: Secondary | ICD-10-CM | POA: Diagnosis not present

## 2018-11-09 DIAGNOSIS — M6281 Muscle weakness (generalized): Secondary | ICD-10-CM | POA: Diagnosis not present

## 2018-11-09 DIAGNOSIS — R278 Other lack of coordination: Secondary | ICD-10-CM | POA: Diagnosis not present

## 2018-11-09 DIAGNOSIS — R2681 Unsteadiness on feet: Secondary | ICD-10-CM | POA: Diagnosis not present

## 2018-11-09 DIAGNOSIS — G8191 Hemiplegia, unspecified affecting right dominant side: Secondary | ICD-10-CM | POA: Diagnosis not present

## 2018-11-10 DIAGNOSIS — R2681 Unsteadiness on feet: Secondary | ICD-10-CM | POA: Diagnosis not present

## 2018-11-10 DIAGNOSIS — G629 Polyneuropathy, unspecified: Secondary | ICD-10-CM | POA: Diagnosis not present

## 2018-11-10 DIAGNOSIS — G8191 Hemiplegia, unspecified affecting right dominant side: Secondary | ICD-10-CM | POA: Diagnosis not present

## 2018-11-10 DIAGNOSIS — H4050X0 Glaucoma secondary to other eye disorders, unspecified eye, stage unspecified: Secondary | ICD-10-CM | POA: Diagnosis not present

## 2018-11-10 DIAGNOSIS — M6281 Muscle weakness (generalized): Secondary | ICD-10-CM | POA: Diagnosis not present

## 2018-11-10 DIAGNOSIS — R278 Other lack of coordination: Secondary | ICD-10-CM | POA: Diagnosis not present

## 2018-11-10 DIAGNOSIS — Z79899 Other long term (current) drug therapy: Secondary | ICD-10-CM | POA: Diagnosis not present

## 2018-11-10 DIAGNOSIS — I2510 Atherosclerotic heart disease of native coronary artery without angina pectoris: Secondary | ICD-10-CM | POA: Diagnosis not present

## 2018-11-10 DIAGNOSIS — E0821 Diabetes mellitus due to underlying condition with diabetic nephropathy: Secondary | ICD-10-CM | POA: Diagnosis not present

## 2018-11-11 DIAGNOSIS — R2681 Unsteadiness on feet: Secondary | ICD-10-CM | POA: Diagnosis not present

## 2018-11-11 DIAGNOSIS — M6281 Muscle weakness (generalized): Secondary | ICD-10-CM | POA: Diagnosis not present

## 2018-11-11 DIAGNOSIS — G8191 Hemiplegia, unspecified affecting right dominant side: Secondary | ICD-10-CM | POA: Diagnosis not present

## 2018-11-11 DIAGNOSIS — R278 Other lack of coordination: Secondary | ICD-10-CM | POA: Diagnosis not present

## 2018-11-12 DIAGNOSIS — R2681 Unsteadiness on feet: Secondary | ICD-10-CM | POA: Diagnosis not present

## 2018-11-12 DIAGNOSIS — M6281 Muscle weakness (generalized): Secondary | ICD-10-CM | POA: Diagnosis not present

## 2018-11-12 DIAGNOSIS — G8191 Hemiplegia, unspecified affecting right dominant side: Secondary | ICD-10-CM | POA: Diagnosis not present

## 2018-11-12 DIAGNOSIS — R278 Other lack of coordination: Secondary | ICD-10-CM | POA: Diagnosis not present

## 2018-11-16 DIAGNOSIS — R278 Other lack of coordination: Secondary | ICD-10-CM | POA: Diagnosis not present

## 2018-11-16 DIAGNOSIS — M6281 Muscle weakness (generalized): Secondary | ICD-10-CM | POA: Diagnosis not present

## 2018-11-16 DIAGNOSIS — R2681 Unsteadiness on feet: Secondary | ICD-10-CM | POA: Diagnosis not present

## 2018-11-16 DIAGNOSIS — G8191 Hemiplegia, unspecified affecting right dominant side: Secondary | ICD-10-CM | POA: Diagnosis not present

## 2018-11-17 DIAGNOSIS — R2681 Unsteadiness on feet: Secondary | ICD-10-CM | POA: Diagnosis not present

## 2018-11-17 DIAGNOSIS — G8191 Hemiplegia, unspecified affecting right dominant side: Secondary | ICD-10-CM | POA: Diagnosis not present

## 2018-11-17 DIAGNOSIS — R278 Other lack of coordination: Secondary | ICD-10-CM | POA: Diagnosis not present

## 2018-11-17 DIAGNOSIS — M6281 Muscle weakness (generalized): Secondary | ICD-10-CM | POA: Diagnosis not present

## 2018-11-18 DIAGNOSIS — G8191 Hemiplegia, unspecified affecting right dominant side: Secondary | ICD-10-CM | POA: Diagnosis not present

## 2018-11-18 DIAGNOSIS — R2681 Unsteadiness on feet: Secondary | ICD-10-CM | POA: Diagnosis not present

## 2018-11-18 DIAGNOSIS — R278 Other lack of coordination: Secondary | ICD-10-CM | POA: Diagnosis not present

## 2018-11-18 DIAGNOSIS — M6281 Muscle weakness (generalized): Secondary | ICD-10-CM | POA: Diagnosis not present

## 2018-11-19 DIAGNOSIS — R278 Other lack of coordination: Secondary | ICD-10-CM | POA: Diagnosis not present

## 2018-11-19 DIAGNOSIS — M6281 Muscle weakness (generalized): Secondary | ICD-10-CM | POA: Diagnosis not present

## 2018-11-19 DIAGNOSIS — G8191 Hemiplegia, unspecified affecting right dominant side: Secondary | ICD-10-CM | POA: Diagnosis not present

## 2018-11-19 DIAGNOSIS — R2681 Unsteadiness on feet: Secondary | ICD-10-CM | POA: Diagnosis not present

## 2018-11-20 DIAGNOSIS — M6281 Muscle weakness (generalized): Secondary | ICD-10-CM | POA: Diagnosis not present

## 2018-11-20 DIAGNOSIS — R2681 Unsteadiness on feet: Secondary | ICD-10-CM | POA: Diagnosis not present

## 2018-11-20 DIAGNOSIS — G8191 Hemiplegia, unspecified affecting right dominant side: Secondary | ICD-10-CM | POA: Diagnosis not present

## 2018-11-20 DIAGNOSIS — R278 Other lack of coordination: Secondary | ICD-10-CM | POA: Diagnosis not present

## 2018-11-23 DIAGNOSIS — G8191 Hemiplegia, unspecified affecting right dominant side: Secondary | ICD-10-CM | POA: Diagnosis not present

## 2018-11-23 DIAGNOSIS — R278 Other lack of coordination: Secondary | ICD-10-CM | POA: Diagnosis not present

## 2018-11-23 DIAGNOSIS — M6281 Muscle weakness (generalized): Secondary | ICD-10-CM | POA: Diagnosis not present

## 2018-11-23 DIAGNOSIS — R2681 Unsteadiness on feet: Secondary | ICD-10-CM | POA: Diagnosis not present

## 2018-11-24 DIAGNOSIS — G629 Polyneuropathy, unspecified: Secondary | ICD-10-CM | POA: Diagnosis not present

## 2018-11-24 DIAGNOSIS — Z79899 Other long term (current) drug therapy: Secondary | ICD-10-CM | POA: Diagnosis not present

## 2018-11-24 DIAGNOSIS — E0821 Diabetes mellitus due to underlying condition with diabetic nephropathy: Secondary | ICD-10-CM | POA: Diagnosis not present

## 2018-11-24 DIAGNOSIS — F339 Major depressive disorder, recurrent, unspecified: Secondary | ICD-10-CM | POA: Diagnosis not present

## 2018-11-24 DIAGNOSIS — I2510 Atherosclerotic heart disease of native coronary artery without angina pectoris: Secondary | ICD-10-CM | POA: Diagnosis not present

## 2018-11-25 DIAGNOSIS — M6281 Muscle weakness (generalized): Secondary | ICD-10-CM | POA: Diagnosis not present

## 2018-11-25 DIAGNOSIS — G8191 Hemiplegia, unspecified affecting right dominant side: Secondary | ICD-10-CM | POA: Diagnosis not present

## 2018-11-25 DIAGNOSIS — R2681 Unsteadiness on feet: Secondary | ICD-10-CM | POA: Diagnosis not present

## 2018-11-25 DIAGNOSIS — R278 Other lack of coordination: Secondary | ICD-10-CM | POA: Diagnosis not present

## 2018-11-26 DIAGNOSIS — M6281 Muscle weakness (generalized): Secondary | ICD-10-CM | POA: Diagnosis not present

## 2018-11-26 DIAGNOSIS — R2681 Unsteadiness on feet: Secondary | ICD-10-CM | POA: Diagnosis not present

## 2018-11-26 DIAGNOSIS — G8191 Hemiplegia, unspecified affecting right dominant side: Secondary | ICD-10-CM | POA: Diagnosis not present

## 2018-11-26 DIAGNOSIS — R278 Other lack of coordination: Secondary | ICD-10-CM | POA: Diagnosis not present

## 2018-11-28 DIAGNOSIS — I69351 Hemiplegia and hemiparesis following cerebral infarction affecting right dominant side: Secondary | ICD-10-CM | POA: Diagnosis not present

## 2018-11-28 DIAGNOSIS — I2510 Atherosclerotic heart disease of native coronary artery without angina pectoris: Secondary | ICD-10-CM | POA: Diagnosis not present

## 2018-11-28 DIAGNOSIS — I5022 Chronic systolic (congestive) heart failure: Secondary | ICD-10-CM | POA: Diagnosis not present

## 2018-11-30 DIAGNOSIS — G8191 Hemiplegia, unspecified affecting right dominant side: Secondary | ICD-10-CM | POA: Diagnosis not present

## 2018-11-30 DIAGNOSIS — M6281 Muscle weakness (generalized): Secondary | ICD-10-CM | POA: Diagnosis not present

## 2018-11-30 DIAGNOSIS — R278 Other lack of coordination: Secondary | ICD-10-CM | POA: Diagnosis not present

## 2018-11-30 DIAGNOSIS — R2681 Unsteadiness on feet: Secondary | ICD-10-CM | POA: Diagnosis not present

## 2018-12-01 DIAGNOSIS — Z79899 Other long term (current) drug therapy: Secondary | ICD-10-CM | POA: Diagnosis not present

## 2018-12-01 DIAGNOSIS — G629 Polyneuropathy, unspecified: Secondary | ICD-10-CM | POA: Diagnosis not present

## 2018-12-01 DIAGNOSIS — F339 Major depressive disorder, recurrent, unspecified: Secondary | ICD-10-CM | POA: Diagnosis not present

## 2018-12-01 DIAGNOSIS — F5109 Other insomnia not due to a substance or known physiological condition: Secondary | ICD-10-CM | POA: Diagnosis not present

## 2018-12-01 DIAGNOSIS — R531 Weakness: Secondary | ICD-10-CM | POA: Diagnosis not present

## 2018-12-01 DIAGNOSIS — I679 Cerebrovascular disease, unspecified: Secondary | ICD-10-CM | POA: Diagnosis not present

## 2018-12-01 DIAGNOSIS — E0821 Diabetes mellitus due to underlying condition with diabetic nephropathy: Secondary | ICD-10-CM | POA: Diagnosis not present

## 2018-12-01 DIAGNOSIS — I2510 Atherosclerotic heart disease of native coronary artery without angina pectoris: Secondary | ICD-10-CM | POA: Diagnosis not present

## 2018-12-02 DIAGNOSIS — R278 Other lack of coordination: Secondary | ICD-10-CM | POA: Diagnosis not present

## 2018-12-02 DIAGNOSIS — M6281 Muscle weakness (generalized): Secondary | ICD-10-CM | POA: Diagnosis not present

## 2018-12-02 DIAGNOSIS — R2681 Unsteadiness on feet: Secondary | ICD-10-CM | POA: Diagnosis not present

## 2018-12-02 DIAGNOSIS — G8191 Hemiplegia, unspecified affecting right dominant side: Secondary | ICD-10-CM | POA: Diagnosis not present

## 2018-12-03 DIAGNOSIS — R2681 Unsteadiness on feet: Secondary | ICD-10-CM | POA: Diagnosis not present

## 2018-12-03 DIAGNOSIS — R278 Other lack of coordination: Secondary | ICD-10-CM | POA: Diagnosis not present

## 2018-12-03 DIAGNOSIS — G8191 Hemiplegia, unspecified affecting right dominant side: Secondary | ICD-10-CM | POA: Diagnosis not present

## 2018-12-03 DIAGNOSIS — M6281 Muscle weakness (generalized): Secondary | ICD-10-CM | POA: Diagnosis not present

## 2018-12-08 DIAGNOSIS — G8191 Hemiplegia, unspecified affecting right dominant side: Secondary | ICD-10-CM | POA: Diagnosis not present

## 2018-12-08 DIAGNOSIS — R278 Other lack of coordination: Secondary | ICD-10-CM | POA: Diagnosis not present

## 2018-12-08 DIAGNOSIS — M6281 Muscle weakness (generalized): Secondary | ICD-10-CM | POA: Diagnosis not present

## 2018-12-08 DIAGNOSIS — R2681 Unsteadiness on feet: Secondary | ICD-10-CM | POA: Diagnosis not present

## 2018-12-09 DIAGNOSIS — R2681 Unsteadiness on feet: Secondary | ICD-10-CM | POA: Diagnosis not present

## 2018-12-09 DIAGNOSIS — G8191 Hemiplegia, unspecified affecting right dominant side: Secondary | ICD-10-CM | POA: Diagnosis not present

## 2018-12-09 DIAGNOSIS — M6281 Muscle weakness (generalized): Secondary | ICD-10-CM | POA: Diagnosis not present

## 2018-12-09 DIAGNOSIS — R278 Other lack of coordination: Secondary | ICD-10-CM | POA: Diagnosis not present

## 2018-12-10 DIAGNOSIS — G8191 Hemiplegia, unspecified affecting right dominant side: Secondary | ICD-10-CM | POA: Diagnosis not present

## 2018-12-10 DIAGNOSIS — R2681 Unsteadiness on feet: Secondary | ICD-10-CM | POA: Diagnosis not present

## 2018-12-10 DIAGNOSIS — R278 Other lack of coordination: Secondary | ICD-10-CM | POA: Diagnosis not present

## 2018-12-10 DIAGNOSIS — M6281 Muscle weakness (generalized): Secondary | ICD-10-CM | POA: Diagnosis not present

## 2018-12-11 ENCOUNTER — Other Ambulatory Visit: Payer: Self-pay | Admitting: Internal Medicine

## 2018-12-15 DIAGNOSIS — R2681 Unsteadiness on feet: Secondary | ICD-10-CM | POA: Diagnosis not present

## 2018-12-15 DIAGNOSIS — G8191 Hemiplegia, unspecified affecting right dominant side: Secondary | ICD-10-CM | POA: Diagnosis not present

## 2018-12-15 DIAGNOSIS — M6281 Muscle weakness (generalized): Secondary | ICD-10-CM | POA: Diagnosis not present

## 2018-12-15 DIAGNOSIS — R278 Other lack of coordination: Secondary | ICD-10-CM | POA: Diagnosis not present

## 2018-12-16 DIAGNOSIS — R278 Other lack of coordination: Secondary | ICD-10-CM | POA: Diagnosis not present

## 2018-12-16 DIAGNOSIS — M6281 Muscle weakness (generalized): Secondary | ICD-10-CM | POA: Diagnosis not present

## 2018-12-16 DIAGNOSIS — R2681 Unsteadiness on feet: Secondary | ICD-10-CM | POA: Diagnosis not present

## 2018-12-16 DIAGNOSIS — G8191 Hemiplegia, unspecified affecting right dominant side: Secondary | ICD-10-CM | POA: Diagnosis not present

## 2018-12-17 DIAGNOSIS — R2681 Unsteadiness on feet: Secondary | ICD-10-CM | POA: Diagnosis not present

## 2018-12-17 DIAGNOSIS — M6281 Muscle weakness (generalized): Secondary | ICD-10-CM | POA: Diagnosis not present

## 2018-12-17 DIAGNOSIS — G8191 Hemiplegia, unspecified affecting right dominant side: Secondary | ICD-10-CM | POA: Diagnosis not present

## 2018-12-17 DIAGNOSIS — R278 Other lack of coordination: Secondary | ICD-10-CM | POA: Diagnosis not present

## 2018-12-22 DIAGNOSIS — G8191 Hemiplegia, unspecified affecting right dominant side: Secondary | ICD-10-CM | POA: Diagnosis not present

## 2018-12-22 DIAGNOSIS — M6281 Muscle weakness (generalized): Secondary | ICD-10-CM | POA: Diagnosis not present

## 2018-12-22 DIAGNOSIS — R278 Other lack of coordination: Secondary | ICD-10-CM | POA: Diagnosis not present

## 2018-12-24 DIAGNOSIS — R278 Other lack of coordination: Secondary | ICD-10-CM | POA: Diagnosis not present

## 2018-12-24 DIAGNOSIS — M6281 Muscle weakness (generalized): Secondary | ICD-10-CM | POA: Diagnosis not present

## 2018-12-24 DIAGNOSIS — G8191 Hemiplegia, unspecified affecting right dominant side: Secondary | ICD-10-CM | POA: Diagnosis not present

## 2018-12-25 DIAGNOSIS — R278 Other lack of coordination: Secondary | ICD-10-CM | POA: Diagnosis not present

## 2018-12-25 DIAGNOSIS — G8191 Hemiplegia, unspecified affecting right dominant side: Secondary | ICD-10-CM | POA: Diagnosis not present

## 2018-12-25 DIAGNOSIS — M6281 Muscle weakness (generalized): Secondary | ICD-10-CM | POA: Diagnosis not present

## 2018-12-27 DIAGNOSIS — I69351 Hemiplegia and hemiparesis following cerebral infarction affecting right dominant side: Secondary | ICD-10-CM | POA: Diagnosis not present

## 2018-12-27 DIAGNOSIS — I2510 Atherosclerotic heart disease of native coronary artery without angina pectoris: Secondary | ICD-10-CM | POA: Diagnosis not present

## 2018-12-27 DIAGNOSIS — I5022 Chronic systolic (congestive) heart failure: Secondary | ICD-10-CM | POA: Diagnosis not present

## 2018-12-29 DIAGNOSIS — G8191 Hemiplegia, unspecified affecting right dominant side: Secondary | ICD-10-CM | POA: Diagnosis not present

## 2018-12-29 DIAGNOSIS — M6281 Muscle weakness (generalized): Secondary | ICD-10-CM | POA: Diagnosis not present

## 2018-12-29 DIAGNOSIS — R278 Other lack of coordination: Secondary | ICD-10-CM | POA: Diagnosis not present

## 2018-12-30 DIAGNOSIS — M6281 Muscle weakness (generalized): Secondary | ICD-10-CM | POA: Diagnosis not present

## 2018-12-30 DIAGNOSIS — G629 Polyneuropathy, unspecified: Secondary | ICD-10-CM | POA: Diagnosis not present

## 2018-12-30 DIAGNOSIS — I2510 Atherosclerotic heart disease of native coronary artery without angina pectoris: Secondary | ICD-10-CM | POA: Diagnosis not present

## 2018-12-30 DIAGNOSIS — Z79899 Other long term (current) drug therapy: Secondary | ICD-10-CM | POA: Diagnosis not present

## 2018-12-30 DIAGNOSIS — I679 Cerebrovascular disease, unspecified: Secondary | ICD-10-CM | POA: Diagnosis not present

## 2018-12-30 DIAGNOSIS — R278 Other lack of coordination: Secondary | ICD-10-CM | POA: Diagnosis not present

## 2018-12-30 DIAGNOSIS — G8191 Hemiplegia, unspecified affecting right dominant side: Secondary | ICD-10-CM | POA: Diagnosis not present

## 2018-12-30 DIAGNOSIS — E0821 Diabetes mellitus due to underlying condition with diabetic nephropathy: Secondary | ICD-10-CM | POA: Diagnosis not present

## 2018-12-31 ENCOUNTER — Other Ambulatory Visit: Payer: Self-pay | Admitting: Internal Medicine

## 2019-01-01 DIAGNOSIS — G8191 Hemiplegia, unspecified affecting right dominant side: Secondary | ICD-10-CM | POA: Diagnosis not present

## 2019-01-01 DIAGNOSIS — M6281 Muscle weakness (generalized): Secondary | ICD-10-CM | POA: Diagnosis not present

## 2019-01-01 DIAGNOSIS — R278 Other lack of coordination: Secondary | ICD-10-CM | POA: Diagnosis not present

## 2019-01-05 DIAGNOSIS — R278 Other lack of coordination: Secondary | ICD-10-CM | POA: Diagnosis not present

## 2019-01-05 DIAGNOSIS — G8191 Hemiplegia, unspecified affecting right dominant side: Secondary | ICD-10-CM | POA: Diagnosis not present

## 2019-01-05 DIAGNOSIS — M6281 Muscle weakness (generalized): Secondary | ICD-10-CM | POA: Diagnosis not present

## 2019-01-06 ENCOUNTER — Other Ambulatory Visit: Payer: Self-pay | Admitting: Internal Medicine

## 2019-01-13 ENCOUNTER — Other Ambulatory Visit: Payer: Self-pay | Admitting: Internal Medicine

## 2019-01-26 ENCOUNTER — Other Ambulatory Visit: Payer: Self-pay | Admitting: Internal Medicine

## 2019-01-26 DIAGNOSIS — I679 Cerebrovascular disease, unspecified: Secondary | ICD-10-CM | POA: Diagnosis not present

## 2019-01-26 DIAGNOSIS — F339 Major depressive disorder, recurrent, unspecified: Secondary | ICD-10-CM | POA: Diagnosis not present

## 2019-01-26 DIAGNOSIS — Z79899 Other long term (current) drug therapy: Secondary | ICD-10-CM | POA: Diagnosis not present

## 2019-01-26 DIAGNOSIS — E0821 Diabetes mellitus due to underlying condition with diabetic nephropathy: Secondary | ICD-10-CM | POA: Diagnosis not present

## 2019-01-26 DIAGNOSIS — I5022 Chronic systolic (congestive) heart failure: Secondary | ICD-10-CM | POA: Diagnosis not present

## 2019-01-27 ENCOUNTER — Other Ambulatory Visit: Payer: Self-pay | Admitting: Family Medicine

## 2019-01-27 DIAGNOSIS — I10 Essential (primary) hypertension: Secondary | ICD-10-CM

## 2019-01-27 DIAGNOSIS — I5022 Chronic systolic (congestive) heart failure: Secondary | ICD-10-CM

## 2019-01-27 DIAGNOSIS — I69351 Hemiplegia and hemiparesis following cerebral infarction affecting right dominant side: Secondary | ICD-10-CM | POA: Diagnosis not present

## 2019-01-27 DIAGNOSIS — I420 Dilated cardiomyopathy: Secondary | ICD-10-CM

## 2019-01-27 DIAGNOSIS — I2510 Atherosclerotic heart disease of native coronary artery without angina pectoris: Secondary | ICD-10-CM | POA: Diagnosis not present

## 2019-01-27 NOTE — Telephone Encounter (Signed)
Request for refill for carvedilol coming from a pharmacy in [place].   Blake Wilson is not an established patient at the [facility], but was on the [place] inpatient teaching service after both his strokes.  He was on the [facility] teaching service until he transferred his care to the [facility]. He had been discharged by [facility] and may have followed up with them of a wellness visit 08/20/18.  Will Rx a month supply of Blake Wilson carvedilol with 2 refills which should last thru the time of the pandemic allowing him to time to establish care with a PCP, [place] or otherwise.   No further refills will be prescribed by [facility] after this, unless pt establishes care with our practice.

## 2019-02-09 DIAGNOSIS — E0821 Diabetes mellitus due to underlying condition with diabetic nephropathy: Secondary | ICD-10-CM | POA: Diagnosis not present

## 2019-02-09 DIAGNOSIS — I679 Cerebrovascular disease, unspecified: Secondary | ICD-10-CM | POA: Diagnosis not present

## 2019-02-09 DIAGNOSIS — I5022 Chronic systolic (congestive) heart failure: Secondary | ICD-10-CM | POA: Diagnosis not present

## 2019-02-09 DIAGNOSIS — Z79899 Other long term (current) drug therapy: Secondary | ICD-10-CM | POA: Diagnosis not present

## 2019-02-09 DIAGNOSIS — F339 Major depressive disorder, recurrent, unspecified: Secondary | ICD-10-CM | POA: Diagnosis not present

## 2019-02-16 DIAGNOSIS — R531 Weakness: Secondary | ICD-10-CM | POA: Diagnosis not present

## 2019-02-16 DIAGNOSIS — I5022 Chronic systolic (congestive) heart failure: Secondary | ICD-10-CM | POA: Diagnosis not present

## 2019-02-16 DIAGNOSIS — G8190 Hemiplegia, unspecified affecting unspecified side: Secondary | ICD-10-CM | POA: Diagnosis not present

## 2019-02-16 DIAGNOSIS — I679 Cerebrovascular disease, unspecified: Secondary | ICD-10-CM | POA: Diagnosis not present

## 2019-02-23 DIAGNOSIS — I679 Cerebrovascular disease, unspecified: Secondary | ICD-10-CM | POA: Diagnosis not present

## 2019-02-23 DIAGNOSIS — E0821 Diabetes mellitus due to underlying condition with diabetic nephropathy: Secondary | ICD-10-CM | POA: Diagnosis not present

## 2019-02-23 DIAGNOSIS — R531 Weakness: Secondary | ICD-10-CM | POA: Diagnosis not present

## 2019-02-23 DIAGNOSIS — I2510 Atherosclerotic heart disease of native coronary artery without angina pectoris: Secondary | ICD-10-CM | POA: Diagnosis not present

## 2019-02-23 DIAGNOSIS — F5109 Other insomnia not due to a substance or known physiological condition: Secondary | ICD-10-CM | POA: Diagnosis not present

## 2019-02-23 DIAGNOSIS — G629 Polyneuropathy, unspecified: Secondary | ICD-10-CM | POA: Diagnosis not present

## 2019-02-23 DIAGNOSIS — F339 Major depressive disorder, recurrent, unspecified: Secondary | ICD-10-CM | POA: Diagnosis not present

## 2019-02-23 DIAGNOSIS — Z79899 Other long term (current) drug therapy: Secondary | ICD-10-CM | POA: Diagnosis not present

## 2019-02-23 DIAGNOSIS — H4050X0 Glaucoma secondary to other eye disorders, unspecified eye, stage unspecified: Secondary | ICD-10-CM | POA: Diagnosis not present

## 2019-02-26 DIAGNOSIS — I2510 Atherosclerotic heart disease of native coronary artery without angina pectoris: Secondary | ICD-10-CM | POA: Diagnosis not present

## 2019-02-26 DIAGNOSIS — I69351 Hemiplegia and hemiparesis following cerebral infarction affecting right dominant side: Secondary | ICD-10-CM | POA: Diagnosis not present

## 2019-02-26 DIAGNOSIS — I5022 Chronic systolic (congestive) heart failure: Secondary | ICD-10-CM | POA: Diagnosis not present

## 2019-03-02 DIAGNOSIS — G629 Polyneuropathy, unspecified: Secondary | ICD-10-CM | POA: Diagnosis not present

## 2019-03-02 DIAGNOSIS — I679 Cerebrovascular disease, unspecified: Secondary | ICD-10-CM | POA: Diagnosis not present

## 2019-03-02 DIAGNOSIS — R531 Weakness: Secondary | ICD-10-CM | POA: Diagnosis not present

## 2019-03-02 DIAGNOSIS — F339 Major depressive disorder, recurrent, unspecified: Secondary | ICD-10-CM | POA: Diagnosis not present

## 2019-03-02 DIAGNOSIS — I2510 Atherosclerotic heart disease of native coronary artery without angina pectoris: Secondary | ICD-10-CM | POA: Diagnosis not present

## 2019-03-02 DIAGNOSIS — F5109 Other insomnia not due to a substance or known physiological condition: Secondary | ICD-10-CM | POA: Diagnosis not present

## 2019-03-02 DIAGNOSIS — E0821 Diabetes mellitus due to underlying condition with diabetic nephropathy: Secondary | ICD-10-CM | POA: Diagnosis not present

## 2019-03-02 DIAGNOSIS — H4050X0 Glaucoma secondary to other eye disorders, unspecified eye, stage unspecified: Secondary | ICD-10-CM | POA: Diagnosis not present

## 2019-03-02 DIAGNOSIS — Z79899 Other long term (current) drug therapy: Secondary | ICD-10-CM | POA: Diagnosis not present

## 2019-03-19 DIAGNOSIS — Z79899 Other long term (current) drug therapy: Secondary | ICD-10-CM | POA: Diagnosis not present

## 2019-03-19 DIAGNOSIS — Z8673 Personal history of transient ischemic attack (TIA), and cerebral infarction without residual deficits: Secondary | ICD-10-CM | POA: Diagnosis not present

## 2019-03-19 DIAGNOSIS — I69391 Dysphagia following cerebral infarction: Secondary | ICD-10-CM | POA: Diagnosis not present

## 2019-03-19 DIAGNOSIS — R001 Bradycardia, unspecified: Secondary | ICD-10-CM | POA: Diagnosis not present

## 2019-03-20 DIAGNOSIS — Z03818 Encounter for observation for suspected exposure to other biological agents ruled out: Secondary | ICD-10-CM | POA: Diagnosis not present

## 2019-03-22 DIAGNOSIS — R2689 Other abnormalities of gait and mobility: Secondary | ICD-10-CM | POA: Diagnosis not present

## 2019-03-22 DIAGNOSIS — R293 Abnormal posture: Secondary | ICD-10-CM | POA: Diagnosis not present

## 2019-03-22 DIAGNOSIS — M6281 Muscle weakness (generalized): Secondary | ICD-10-CM | POA: Diagnosis not present

## 2019-03-22 DIAGNOSIS — R278 Other lack of coordination: Secondary | ICD-10-CM | POA: Diagnosis not present

## 2019-03-24 DIAGNOSIS — F329 Major depressive disorder, single episode, unspecified: Secondary | ICD-10-CM | POA: Diagnosis not present

## 2019-03-24 DIAGNOSIS — R278 Other lack of coordination: Secondary | ICD-10-CM | POA: Diagnosis not present

## 2019-03-24 DIAGNOSIS — M6281 Muscle weakness (generalized): Secondary | ICD-10-CM | POA: Diagnosis not present

## 2019-03-24 DIAGNOSIS — R293 Abnormal posture: Secondary | ICD-10-CM | POA: Diagnosis not present

## 2019-03-24 DIAGNOSIS — R2689 Other abnormalities of gait and mobility: Secondary | ICD-10-CM | POA: Diagnosis not present

## 2019-03-25 DIAGNOSIS — F3341 Major depressive disorder, recurrent, in partial remission: Secondary | ICD-10-CM | POA: Diagnosis not present

## 2019-03-26 DIAGNOSIS — R278 Other lack of coordination: Secondary | ICD-10-CM | POA: Diagnosis not present

## 2019-03-26 DIAGNOSIS — R293 Abnormal posture: Secondary | ICD-10-CM | POA: Diagnosis not present

## 2019-03-26 DIAGNOSIS — M6281 Muscle weakness (generalized): Secondary | ICD-10-CM | POA: Diagnosis not present

## 2019-03-26 DIAGNOSIS — R2689 Other abnormalities of gait and mobility: Secondary | ICD-10-CM | POA: Diagnosis not present

## 2019-03-28 DIAGNOSIS — R293 Abnormal posture: Secondary | ICD-10-CM | POA: Diagnosis not present

## 2019-03-28 DIAGNOSIS — R278 Other lack of coordination: Secondary | ICD-10-CM | POA: Diagnosis not present

## 2019-03-28 DIAGNOSIS — R2689 Other abnormalities of gait and mobility: Secondary | ICD-10-CM | POA: Diagnosis not present

## 2019-03-28 DIAGNOSIS — M6281 Muscle weakness (generalized): Secondary | ICD-10-CM | POA: Diagnosis not present

## 2019-03-29 DIAGNOSIS — I69351 Hemiplegia and hemiparesis following cerebral infarction affecting right dominant side: Secondary | ICD-10-CM | POA: Diagnosis not present

## 2019-03-29 DIAGNOSIS — I2510 Atherosclerotic heart disease of native coronary artery without angina pectoris: Secondary | ICD-10-CM | POA: Diagnosis not present

## 2019-03-29 DIAGNOSIS — I5022 Chronic systolic (congestive) heart failure: Secondary | ICD-10-CM | POA: Diagnosis not present

## 2019-04-02 DIAGNOSIS — R278 Other lack of coordination: Secondary | ICD-10-CM | POA: Diagnosis not present

## 2019-04-02 DIAGNOSIS — R293 Abnormal posture: Secondary | ICD-10-CM | POA: Diagnosis not present

## 2019-04-02 DIAGNOSIS — R2689 Other abnormalities of gait and mobility: Secondary | ICD-10-CM | POA: Diagnosis not present

## 2019-04-02 DIAGNOSIS — M6281 Muscle weakness (generalized): Secondary | ICD-10-CM | POA: Diagnosis not present

## 2019-04-04 DIAGNOSIS — R293 Abnormal posture: Secondary | ICD-10-CM | POA: Diagnosis not present

## 2019-04-04 DIAGNOSIS — R278 Other lack of coordination: Secondary | ICD-10-CM | POA: Diagnosis not present

## 2019-04-04 DIAGNOSIS — R2689 Other abnormalities of gait and mobility: Secondary | ICD-10-CM | POA: Diagnosis not present

## 2019-04-04 DIAGNOSIS — M6281 Muscle weakness (generalized): Secondary | ICD-10-CM | POA: Diagnosis not present

## 2019-04-05 DIAGNOSIS — R278 Other lack of coordination: Secondary | ICD-10-CM | POA: Diagnosis not present

## 2019-04-05 DIAGNOSIS — F329 Major depressive disorder, single episode, unspecified: Secondary | ICD-10-CM | POA: Diagnosis not present

## 2019-04-05 DIAGNOSIS — M6281 Muscle weakness (generalized): Secondary | ICD-10-CM | POA: Diagnosis not present

## 2019-04-05 DIAGNOSIS — R2689 Other abnormalities of gait and mobility: Secondary | ICD-10-CM | POA: Diagnosis not present

## 2019-04-05 DIAGNOSIS — R293 Abnormal posture: Secondary | ICD-10-CM | POA: Diagnosis not present

## 2019-04-06 DIAGNOSIS — M6281 Muscle weakness (generalized): Secondary | ICD-10-CM | POA: Diagnosis not present

## 2019-04-06 DIAGNOSIS — R278 Other lack of coordination: Secondary | ICD-10-CM | POA: Diagnosis not present

## 2019-04-06 DIAGNOSIS — R2689 Other abnormalities of gait and mobility: Secondary | ICD-10-CM | POA: Diagnosis not present

## 2019-04-06 DIAGNOSIS — R293 Abnormal posture: Secondary | ICD-10-CM | POA: Diagnosis not present

## 2019-04-08 DIAGNOSIS — R278 Other lack of coordination: Secondary | ICD-10-CM | POA: Diagnosis not present

## 2019-04-08 DIAGNOSIS — R293 Abnormal posture: Secondary | ICD-10-CM | POA: Diagnosis not present

## 2019-04-08 DIAGNOSIS — M6281 Muscle weakness (generalized): Secondary | ICD-10-CM | POA: Diagnosis not present

## 2019-04-08 DIAGNOSIS — R2689 Other abnormalities of gait and mobility: Secondary | ICD-10-CM | POA: Diagnosis not present

## 2019-04-09 DIAGNOSIS — R2689 Other abnormalities of gait and mobility: Secondary | ICD-10-CM | POA: Diagnosis not present

## 2019-04-09 DIAGNOSIS — R278 Other lack of coordination: Secondary | ICD-10-CM | POA: Diagnosis not present

## 2019-04-09 DIAGNOSIS — M6281 Muscle weakness (generalized): Secondary | ICD-10-CM | POA: Diagnosis not present

## 2019-04-09 DIAGNOSIS — R293 Abnormal posture: Secondary | ICD-10-CM | POA: Diagnosis not present

## 2019-04-13 DIAGNOSIS — M6281 Muscle weakness (generalized): Secondary | ICD-10-CM | POA: Diagnosis not present

## 2019-04-13 DIAGNOSIS — R2689 Other abnormalities of gait and mobility: Secondary | ICD-10-CM | POA: Diagnosis not present

## 2019-04-13 DIAGNOSIS — R293 Abnormal posture: Secondary | ICD-10-CM | POA: Diagnosis not present

## 2019-04-13 DIAGNOSIS — R278 Other lack of coordination: Secondary | ICD-10-CM | POA: Diagnosis not present

## 2019-04-15 DIAGNOSIS — M6281 Muscle weakness (generalized): Secondary | ICD-10-CM | POA: Diagnosis not present

## 2019-04-15 DIAGNOSIS — R293 Abnormal posture: Secondary | ICD-10-CM | POA: Diagnosis not present

## 2019-04-15 DIAGNOSIS — R278 Other lack of coordination: Secondary | ICD-10-CM | POA: Diagnosis not present

## 2019-04-15 DIAGNOSIS — R2689 Other abnormalities of gait and mobility: Secondary | ICD-10-CM | POA: Diagnosis not present

## 2019-04-16 DIAGNOSIS — M6281 Muscle weakness (generalized): Secondary | ICD-10-CM | POA: Diagnosis not present

## 2019-04-16 DIAGNOSIS — R1310 Dysphagia, unspecified: Secondary | ICD-10-CM | POA: Diagnosis not present

## 2019-04-16 DIAGNOSIS — Z713 Dietary counseling and surveillance: Secondary | ICD-10-CM | POA: Diagnosis not present

## 2019-04-16 DIAGNOSIS — R001 Bradycardia, unspecified: Secondary | ICD-10-CM | POA: Diagnosis not present

## 2019-04-16 DIAGNOSIS — I10 Essential (primary) hypertension: Secondary | ICD-10-CM | POA: Diagnosis not present

## 2019-04-16 DIAGNOSIS — R278 Other lack of coordination: Secondary | ICD-10-CM | POA: Diagnosis not present

## 2019-04-16 DIAGNOSIS — R293 Abnormal posture: Secondary | ICD-10-CM | POA: Diagnosis not present

## 2019-04-16 DIAGNOSIS — R2689 Other abnormalities of gait and mobility: Secondary | ICD-10-CM | POA: Diagnosis not present

## 2019-04-16 DIAGNOSIS — Z79899 Other long term (current) drug therapy: Secondary | ICD-10-CM | POA: Diagnosis not present

## 2019-04-19 DIAGNOSIS — F329 Major depressive disorder, single episode, unspecified: Secondary | ICD-10-CM | POA: Diagnosis not present

## 2019-04-28 DIAGNOSIS — I5022 Chronic systolic (congestive) heart failure: Secondary | ICD-10-CM | POA: Diagnosis not present

## 2019-04-28 DIAGNOSIS — I2510 Atherosclerotic heart disease of native coronary artery without angina pectoris: Secondary | ICD-10-CM | POA: Diagnosis not present

## 2019-04-28 DIAGNOSIS — I69351 Hemiplegia and hemiparesis following cerebral infarction affecting right dominant side: Secondary | ICD-10-CM | POA: Diagnosis not present

## 2019-04-29 DIAGNOSIS — F3341 Major depressive disorder, recurrent, in partial remission: Secondary | ICD-10-CM | POA: Diagnosis not present

## 2019-05-03 DIAGNOSIS — F329 Major depressive disorder, single episode, unspecified: Secondary | ICD-10-CM | POA: Diagnosis not present

## 2019-05-10 DIAGNOSIS — F329 Major depressive disorder, single episode, unspecified: Secondary | ICD-10-CM | POA: Diagnosis not present

## 2019-05-14 DIAGNOSIS — R1310 Dysphagia, unspecified: Secondary | ICD-10-CM | POA: Diagnosis not present

## 2019-05-14 DIAGNOSIS — Z79899 Other long term (current) drug therapy: Secondary | ICD-10-CM | POA: Diagnosis not present

## 2019-05-14 DIAGNOSIS — Z713 Dietary counseling and surveillance: Secondary | ICD-10-CM | POA: Diagnosis not present

## 2019-05-14 DIAGNOSIS — I10 Essential (primary) hypertension: Secondary | ICD-10-CM | POA: Diagnosis not present

## 2019-05-21 DIAGNOSIS — F329 Major depressive disorder, single episode, unspecified: Secondary | ICD-10-CM | POA: Diagnosis not present

## 2019-05-26 DIAGNOSIS — Z79899 Other long term (current) drug therapy: Secondary | ICD-10-CM | POA: Diagnosis not present

## 2019-05-26 DIAGNOSIS — E0821 Diabetes mellitus due to underlying condition with diabetic nephropathy: Secondary | ICD-10-CM | POA: Diagnosis not present

## 2019-05-29 DIAGNOSIS — I2510 Atherosclerotic heart disease of native coronary artery without angina pectoris: Secondary | ICD-10-CM | POA: Diagnosis not present

## 2019-05-29 DIAGNOSIS — I69351 Hemiplegia and hemiparesis following cerebral infarction affecting right dominant side: Secondary | ICD-10-CM | POA: Diagnosis not present

## 2019-05-29 DIAGNOSIS — I5022 Chronic systolic (congestive) heart failure: Secondary | ICD-10-CM | POA: Diagnosis not present

## 2019-05-31 DIAGNOSIS — F329 Major depressive disorder, single episode, unspecified: Secondary | ICD-10-CM | POA: Diagnosis not present

## 2019-06-01 DIAGNOSIS — F339 Major depressive disorder, recurrent, unspecified: Secondary | ICD-10-CM | POA: Diagnosis not present

## 2019-06-01 DIAGNOSIS — E0821 Diabetes mellitus due to underlying condition with diabetic nephropathy: Secondary | ICD-10-CM | POA: Diagnosis not present

## 2019-06-01 DIAGNOSIS — I679 Cerebrovascular disease, unspecified: Secondary | ICD-10-CM | POA: Diagnosis not present

## 2019-06-01 DIAGNOSIS — Z79899 Other long term (current) drug therapy: Secondary | ICD-10-CM | POA: Diagnosis not present

## 2019-06-01 DIAGNOSIS — I5022 Chronic systolic (congestive) heart failure: Secondary | ICD-10-CM | POA: Diagnosis not present

## 2019-06-03 DIAGNOSIS — Z79899 Other long term (current) drug therapy: Secondary | ICD-10-CM | POA: Diagnosis not present

## 2019-06-07 DIAGNOSIS — F329 Major depressive disorder, single episode, unspecified: Secondary | ICD-10-CM | POA: Diagnosis not present

## 2019-06-09 DIAGNOSIS — I5022 Chronic systolic (congestive) heart failure: Secondary | ICD-10-CM | POA: Diagnosis not present

## 2019-06-09 DIAGNOSIS — J449 Chronic obstructive pulmonary disease, unspecified: Secondary | ICD-10-CM | POA: Diagnosis not present

## 2019-06-09 DIAGNOSIS — I69322 Dysarthria following cerebral infarction: Secondary | ICD-10-CM | POA: Diagnosis not present

## 2019-06-09 DIAGNOSIS — I2510 Atherosclerotic heart disease of native coronary artery without angina pectoris: Secondary | ICD-10-CM | POA: Diagnosis not present

## 2019-06-09 DIAGNOSIS — R1312 Dysphagia, oropharyngeal phase: Secondary | ICD-10-CM | POA: Diagnosis not present

## 2019-06-09 DIAGNOSIS — I69391 Dysphagia following cerebral infarction: Secondary | ICD-10-CM | POA: Diagnosis not present

## 2019-06-09 DIAGNOSIS — E1140 Type 2 diabetes mellitus with diabetic neuropathy, unspecified: Secondary | ICD-10-CM | POA: Diagnosis not present

## 2019-06-09 DIAGNOSIS — I69351 Hemiplegia and hemiparesis following cerebral infarction affecting right dominant side: Secondary | ICD-10-CM | POA: Diagnosis not present

## 2019-06-09 DIAGNOSIS — I69320 Aphasia following cerebral infarction: Secondary | ICD-10-CM | POA: Diagnosis not present

## 2019-06-10 DIAGNOSIS — F3341 Major depressive disorder, recurrent, in partial remission: Secondary | ICD-10-CM | POA: Diagnosis not present

## 2019-06-10 DIAGNOSIS — E1140 Type 2 diabetes mellitus with diabetic neuropathy, unspecified: Secondary | ICD-10-CM | POA: Diagnosis not present

## 2019-06-10 DIAGNOSIS — I69351 Hemiplegia and hemiparesis following cerebral infarction affecting right dominant side: Secondary | ICD-10-CM | POA: Diagnosis not present

## 2019-06-10 DIAGNOSIS — R1312 Dysphagia, oropharyngeal phase: Secondary | ICD-10-CM | POA: Diagnosis not present

## 2019-06-10 DIAGNOSIS — J449 Chronic obstructive pulmonary disease, unspecified: Secondary | ICD-10-CM | POA: Diagnosis not present

## 2019-06-10 DIAGNOSIS — I69391 Dysphagia following cerebral infarction: Secondary | ICD-10-CM | POA: Diagnosis not present

## 2019-06-10 DIAGNOSIS — I2510 Atherosclerotic heart disease of native coronary artery without angina pectoris: Secondary | ICD-10-CM | POA: Diagnosis not present

## 2019-06-10 DIAGNOSIS — I5022 Chronic systolic (congestive) heart failure: Secondary | ICD-10-CM | POA: Diagnosis not present

## 2019-06-10 DIAGNOSIS — I69322 Dysarthria following cerebral infarction: Secondary | ICD-10-CM | POA: Diagnosis not present

## 2019-06-10 DIAGNOSIS — I69320 Aphasia following cerebral infarction: Secondary | ICD-10-CM | POA: Diagnosis not present

## 2019-06-14 DIAGNOSIS — F329 Major depressive disorder, single episode, unspecified: Secondary | ICD-10-CM | POA: Diagnosis not present

## 2019-06-15 DIAGNOSIS — E1140 Type 2 diabetes mellitus with diabetic neuropathy, unspecified: Secondary | ICD-10-CM | POA: Diagnosis not present

## 2019-06-15 DIAGNOSIS — J449 Chronic obstructive pulmonary disease, unspecified: Secondary | ICD-10-CM | POA: Diagnosis not present

## 2019-06-15 DIAGNOSIS — I5022 Chronic systolic (congestive) heart failure: Secondary | ICD-10-CM | POA: Diagnosis not present

## 2019-06-15 DIAGNOSIS — I69391 Dysphagia following cerebral infarction: Secondary | ICD-10-CM | POA: Diagnosis not present

## 2019-06-15 DIAGNOSIS — I69320 Aphasia following cerebral infarction: Secondary | ICD-10-CM | POA: Diagnosis not present

## 2019-06-15 DIAGNOSIS — I69351 Hemiplegia and hemiparesis following cerebral infarction affecting right dominant side: Secondary | ICD-10-CM | POA: Diagnosis not present

## 2019-06-15 DIAGNOSIS — I69322 Dysarthria following cerebral infarction: Secondary | ICD-10-CM | POA: Diagnosis not present

## 2019-06-15 DIAGNOSIS — I2510 Atherosclerotic heart disease of native coronary artery without angina pectoris: Secondary | ICD-10-CM | POA: Diagnosis not present

## 2019-06-15 DIAGNOSIS — R1312 Dysphagia, oropharyngeal phase: Secondary | ICD-10-CM | POA: Diagnosis not present

## 2019-06-17 DIAGNOSIS — I69391 Dysphagia following cerebral infarction: Secondary | ICD-10-CM | POA: Diagnosis not present

## 2019-06-17 DIAGNOSIS — I69322 Dysarthria following cerebral infarction: Secondary | ICD-10-CM | POA: Diagnosis not present

## 2019-06-17 DIAGNOSIS — J449 Chronic obstructive pulmonary disease, unspecified: Secondary | ICD-10-CM | POA: Diagnosis not present

## 2019-06-17 DIAGNOSIS — I69351 Hemiplegia and hemiparesis following cerebral infarction affecting right dominant side: Secondary | ICD-10-CM | POA: Diagnosis not present

## 2019-06-17 DIAGNOSIS — R1312 Dysphagia, oropharyngeal phase: Secondary | ICD-10-CM | POA: Diagnosis not present

## 2019-06-17 DIAGNOSIS — I5022 Chronic systolic (congestive) heart failure: Secondary | ICD-10-CM | POA: Diagnosis not present

## 2019-06-17 DIAGNOSIS — I69320 Aphasia following cerebral infarction: Secondary | ICD-10-CM | POA: Diagnosis not present

## 2019-06-17 DIAGNOSIS — E1140 Type 2 diabetes mellitus with diabetic neuropathy, unspecified: Secondary | ICD-10-CM | POA: Diagnosis not present

## 2019-06-17 DIAGNOSIS — I2510 Atherosclerotic heart disease of native coronary artery without angina pectoris: Secondary | ICD-10-CM | POA: Diagnosis not present

## 2019-06-18 DIAGNOSIS — R1310 Dysphagia, unspecified: Secondary | ICD-10-CM | POA: Diagnosis not present

## 2019-06-18 DIAGNOSIS — E782 Mixed hyperlipidemia: Secondary | ICD-10-CM | POA: Diagnosis not present

## 2019-06-18 DIAGNOSIS — I519 Heart disease, unspecified: Secondary | ICD-10-CM | POA: Diagnosis not present

## 2019-06-18 DIAGNOSIS — Z79899 Other long term (current) drug therapy: Secondary | ICD-10-CM | POA: Diagnosis not present

## 2019-06-18 DIAGNOSIS — F331 Major depressive disorder, recurrent, moderate: Secondary | ICD-10-CM | POA: Diagnosis not present

## 2019-06-21 DIAGNOSIS — I69322 Dysarthria following cerebral infarction: Secondary | ICD-10-CM | POA: Diagnosis not present

## 2019-06-21 DIAGNOSIS — E1140 Type 2 diabetes mellitus with diabetic neuropathy, unspecified: Secondary | ICD-10-CM | POA: Diagnosis not present

## 2019-06-21 DIAGNOSIS — I69351 Hemiplegia and hemiparesis following cerebral infarction affecting right dominant side: Secondary | ICD-10-CM | POA: Diagnosis not present

## 2019-06-21 DIAGNOSIS — I69320 Aphasia following cerebral infarction: Secondary | ICD-10-CM | POA: Diagnosis not present

## 2019-06-21 DIAGNOSIS — J449 Chronic obstructive pulmonary disease, unspecified: Secondary | ICD-10-CM | POA: Diagnosis not present

## 2019-06-21 DIAGNOSIS — F329 Major depressive disorder, single episode, unspecified: Secondary | ICD-10-CM | POA: Diagnosis not present

## 2019-06-21 DIAGNOSIS — I69391 Dysphagia following cerebral infarction: Secondary | ICD-10-CM | POA: Diagnosis not present

## 2019-06-21 DIAGNOSIS — I5022 Chronic systolic (congestive) heart failure: Secondary | ICD-10-CM | POA: Diagnosis not present

## 2019-06-21 DIAGNOSIS — I2510 Atherosclerotic heart disease of native coronary artery without angina pectoris: Secondary | ICD-10-CM | POA: Diagnosis not present

## 2019-06-21 DIAGNOSIS — R1312 Dysphagia, oropharyngeal phase: Secondary | ICD-10-CM | POA: Diagnosis not present

## 2019-06-22 DIAGNOSIS — I69351 Hemiplegia and hemiparesis following cerebral infarction affecting right dominant side: Secondary | ICD-10-CM | POA: Diagnosis not present

## 2019-06-22 DIAGNOSIS — J449 Chronic obstructive pulmonary disease, unspecified: Secondary | ICD-10-CM | POA: Diagnosis not present

## 2019-06-22 DIAGNOSIS — E1140 Type 2 diabetes mellitus with diabetic neuropathy, unspecified: Secondary | ICD-10-CM | POA: Diagnosis not present

## 2019-06-22 DIAGNOSIS — I69322 Dysarthria following cerebral infarction: Secondary | ICD-10-CM | POA: Diagnosis not present

## 2019-06-22 DIAGNOSIS — I5022 Chronic systolic (congestive) heart failure: Secondary | ICD-10-CM | POA: Diagnosis not present

## 2019-06-22 DIAGNOSIS — R1312 Dysphagia, oropharyngeal phase: Secondary | ICD-10-CM | POA: Diagnosis not present

## 2019-06-22 DIAGNOSIS — I69320 Aphasia following cerebral infarction: Secondary | ICD-10-CM | POA: Diagnosis not present

## 2019-06-22 DIAGNOSIS — I2510 Atherosclerotic heart disease of native coronary artery without angina pectoris: Secondary | ICD-10-CM | POA: Diagnosis not present

## 2019-06-22 DIAGNOSIS — I69391 Dysphagia following cerebral infarction: Secondary | ICD-10-CM | POA: Diagnosis not present

## 2019-06-24 DIAGNOSIS — I69322 Dysarthria following cerebral infarction: Secondary | ICD-10-CM | POA: Diagnosis not present

## 2019-06-24 DIAGNOSIS — I69320 Aphasia following cerebral infarction: Secondary | ICD-10-CM | POA: Diagnosis not present

## 2019-06-24 DIAGNOSIS — I69391 Dysphagia following cerebral infarction: Secondary | ICD-10-CM | POA: Diagnosis not present

## 2019-06-24 DIAGNOSIS — E1140 Type 2 diabetes mellitus with diabetic neuropathy, unspecified: Secondary | ICD-10-CM | POA: Diagnosis not present

## 2019-06-24 DIAGNOSIS — I2510 Atherosclerotic heart disease of native coronary artery without angina pectoris: Secondary | ICD-10-CM | POA: Diagnosis not present

## 2019-06-24 DIAGNOSIS — I5022 Chronic systolic (congestive) heart failure: Secondary | ICD-10-CM | POA: Diagnosis not present

## 2019-06-24 DIAGNOSIS — J449 Chronic obstructive pulmonary disease, unspecified: Secondary | ICD-10-CM | POA: Diagnosis not present

## 2019-06-24 DIAGNOSIS — I69351 Hemiplegia and hemiparesis following cerebral infarction affecting right dominant side: Secondary | ICD-10-CM | POA: Diagnosis not present

## 2019-06-24 DIAGNOSIS — R1312 Dysphagia, oropharyngeal phase: Secondary | ICD-10-CM | POA: Diagnosis not present

## 2019-06-29 DIAGNOSIS — I69322 Dysarthria following cerebral infarction: Secondary | ICD-10-CM | POA: Diagnosis not present

## 2019-06-29 DIAGNOSIS — R1312 Dysphagia, oropharyngeal phase: Secondary | ICD-10-CM | POA: Diagnosis not present

## 2019-06-29 DIAGNOSIS — I69320 Aphasia following cerebral infarction: Secondary | ICD-10-CM | POA: Diagnosis not present

## 2019-06-29 DIAGNOSIS — I2510 Atherosclerotic heart disease of native coronary artery without angina pectoris: Secondary | ICD-10-CM | POA: Diagnosis not present

## 2019-06-29 DIAGNOSIS — E1140 Type 2 diabetes mellitus with diabetic neuropathy, unspecified: Secondary | ICD-10-CM | POA: Diagnosis not present

## 2019-06-29 DIAGNOSIS — J449 Chronic obstructive pulmonary disease, unspecified: Secondary | ICD-10-CM | POA: Diagnosis not present

## 2019-06-29 DIAGNOSIS — I69351 Hemiplegia and hemiparesis following cerebral infarction affecting right dominant side: Secondary | ICD-10-CM | POA: Diagnosis not present

## 2019-06-29 DIAGNOSIS — I5022 Chronic systolic (congestive) heart failure: Secondary | ICD-10-CM | POA: Diagnosis not present

## 2019-06-29 DIAGNOSIS — I69391 Dysphagia following cerebral infarction: Secondary | ICD-10-CM | POA: Diagnosis not present

## 2019-07-01 DIAGNOSIS — I69351 Hemiplegia and hemiparesis following cerebral infarction affecting right dominant side: Secondary | ICD-10-CM | POA: Diagnosis not present

## 2019-07-01 DIAGNOSIS — J449 Chronic obstructive pulmonary disease, unspecified: Secondary | ICD-10-CM | POA: Diagnosis not present

## 2019-07-01 DIAGNOSIS — R1312 Dysphagia, oropharyngeal phase: Secondary | ICD-10-CM | POA: Diagnosis not present

## 2019-07-01 DIAGNOSIS — I5022 Chronic systolic (congestive) heart failure: Secondary | ICD-10-CM | POA: Diagnosis not present

## 2019-07-01 DIAGNOSIS — I69322 Dysarthria following cerebral infarction: Secondary | ICD-10-CM | POA: Diagnosis not present

## 2019-07-01 DIAGNOSIS — E1140 Type 2 diabetes mellitus with diabetic neuropathy, unspecified: Secondary | ICD-10-CM | POA: Diagnosis not present

## 2019-07-01 DIAGNOSIS — I69391 Dysphagia following cerebral infarction: Secondary | ICD-10-CM | POA: Diagnosis not present

## 2019-07-01 DIAGNOSIS — I2510 Atherosclerotic heart disease of native coronary artery without angina pectoris: Secondary | ICD-10-CM | POA: Diagnosis not present

## 2019-07-01 DIAGNOSIS — I69320 Aphasia following cerebral infarction: Secondary | ICD-10-CM | POA: Diagnosis not present

## 2019-07-05 DIAGNOSIS — I5022 Chronic systolic (congestive) heart failure: Secondary | ICD-10-CM | POA: Diagnosis not present

## 2019-07-05 DIAGNOSIS — J449 Chronic obstructive pulmonary disease, unspecified: Secondary | ICD-10-CM | POA: Diagnosis not present

## 2019-07-05 DIAGNOSIS — I69391 Dysphagia following cerebral infarction: Secondary | ICD-10-CM | POA: Diagnosis not present

## 2019-07-05 DIAGNOSIS — F329 Major depressive disorder, single episode, unspecified: Secondary | ICD-10-CM | POA: Diagnosis not present

## 2019-07-05 DIAGNOSIS — E1140 Type 2 diabetes mellitus with diabetic neuropathy, unspecified: Secondary | ICD-10-CM | POA: Diagnosis not present

## 2019-07-05 DIAGNOSIS — R1312 Dysphagia, oropharyngeal phase: Secondary | ICD-10-CM | POA: Diagnosis not present

## 2019-07-05 DIAGNOSIS — I2510 Atherosclerotic heart disease of native coronary artery without angina pectoris: Secondary | ICD-10-CM | POA: Diagnosis not present

## 2019-07-05 DIAGNOSIS — I69322 Dysarthria following cerebral infarction: Secondary | ICD-10-CM | POA: Diagnosis not present

## 2019-07-05 DIAGNOSIS — I69320 Aphasia following cerebral infarction: Secondary | ICD-10-CM | POA: Diagnosis not present

## 2019-07-05 DIAGNOSIS — I69351 Hemiplegia and hemiparesis following cerebral infarction affecting right dominant side: Secondary | ICD-10-CM | POA: Diagnosis not present

## 2019-07-06 DIAGNOSIS — E1140 Type 2 diabetes mellitus with diabetic neuropathy, unspecified: Secondary | ICD-10-CM | POA: Diagnosis not present

## 2019-07-06 DIAGNOSIS — R1312 Dysphagia, oropharyngeal phase: Secondary | ICD-10-CM | POA: Diagnosis not present

## 2019-07-06 DIAGNOSIS — I2510 Atherosclerotic heart disease of native coronary artery without angina pectoris: Secondary | ICD-10-CM | POA: Diagnosis not present

## 2019-07-06 DIAGNOSIS — I69351 Hemiplegia and hemiparesis following cerebral infarction affecting right dominant side: Secondary | ICD-10-CM | POA: Diagnosis not present

## 2019-07-06 DIAGNOSIS — I69320 Aphasia following cerebral infarction: Secondary | ICD-10-CM | POA: Diagnosis not present

## 2019-07-06 DIAGNOSIS — J449 Chronic obstructive pulmonary disease, unspecified: Secondary | ICD-10-CM | POA: Diagnosis not present

## 2019-07-06 DIAGNOSIS — I69322 Dysarthria following cerebral infarction: Secondary | ICD-10-CM | POA: Diagnosis not present

## 2019-07-06 DIAGNOSIS — I5022 Chronic systolic (congestive) heart failure: Secondary | ICD-10-CM | POA: Diagnosis not present

## 2019-07-06 DIAGNOSIS — I69391 Dysphagia following cerebral infarction: Secondary | ICD-10-CM | POA: Diagnosis not present

## 2019-07-08 DIAGNOSIS — F3341 Major depressive disorder, recurrent, in partial remission: Secondary | ICD-10-CM | POA: Diagnosis not present

## 2019-07-09 DIAGNOSIS — R260 Ataxic gait: Secondary | ICD-10-CM | POA: Diagnosis not present

## 2019-07-09 DIAGNOSIS — R5381 Other malaise: Secondary | ICD-10-CM | POA: Diagnosis not present

## 2019-07-09 DIAGNOSIS — R29898 Other symptoms and signs involving the musculoskeletal system: Secondary | ICD-10-CM | POA: Diagnosis not present

## 2019-07-09 DIAGNOSIS — Z993 Dependence on wheelchair: Secondary | ICD-10-CM | POA: Diagnosis not present

## 2019-07-09 DIAGNOSIS — Z79899 Other long term (current) drug therapy: Secondary | ICD-10-CM | POA: Diagnosis not present

## 2019-07-12 DIAGNOSIS — F329 Major depressive disorder, single episode, unspecified: Secondary | ICD-10-CM | POA: Diagnosis not present

## 2019-07-13 DIAGNOSIS — I2510 Atherosclerotic heart disease of native coronary artery without angina pectoris: Secondary | ICD-10-CM | POA: Diagnosis not present

## 2019-07-13 DIAGNOSIS — I69391 Dysphagia following cerebral infarction: Secondary | ICD-10-CM | POA: Diagnosis not present

## 2019-07-13 DIAGNOSIS — R1312 Dysphagia, oropharyngeal phase: Secondary | ICD-10-CM | POA: Diagnosis not present

## 2019-07-13 DIAGNOSIS — E1140 Type 2 diabetes mellitus with diabetic neuropathy, unspecified: Secondary | ICD-10-CM | POA: Diagnosis not present

## 2019-07-13 DIAGNOSIS — I69351 Hemiplegia and hemiparesis following cerebral infarction affecting right dominant side: Secondary | ICD-10-CM | POA: Diagnosis not present

## 2019-07-13 DIAGNOSIS — J449 Chronic obstructive pulmonary disease, unspecified: Secondary | ICD-10-CM | POA: Diagnosis not present

## 2019-07-13 DIAGNOSIS — I5022 Chronic systolic (congestive) heart failure: Secondary | ICD-10-CM | POA: Diagnosis not present

## 2019-07-13 DIAGNOSIS — I69322 Dysarthria following cerebral infarction: Secondary | ICD-10-CM | POA: Diagnosis not present

## 2019-07-13 DIAGNOSIS — I69320 Aphasia following cerebral infarction: Secondary | ICD-10-CM | POA: Diagnosis not present

## 2019-07-15 DIAGNOSIS — M79673 Pain in unspecified foot: Secondary | ICD-10-CM | POA: Diagnosis not present

## 2019-07-15 DIAGNOSIS — B351 Tinea unguium: Secondary | ICD-10-CM | POA: Diagnosis not present

## 2019-07-15 DIAGNOSIS — L603 Nail dystrophy: Secondary | ICD-10-CM | POA: Diagnosis not present

## 2019-07-15 DIAGNOSIS — E1140 Type 2 diabetes mellitus with diabetic neuropathy, unspecified: Secondary | ICD-10-CM | POA: Diagnosis not present

## 2019-07-15 DIAGNOSIS — R2689 Other abnormalities of gait and mobility: Secondary | ICD-10-CM | POA: Diagnosis not present

## 2019-07-16 DIAGNOSIS — I679 Cerebrovascular disease, unspecified: Secondary | ICD-10-CM | POA: Diagnosis not present

## 2019-07-16 DIAGNOSIS — Z993 Dependence on wheelchair: Secondary | ICD-10-CM | POA: Diagnosis not present

## 2019-07-20 DIAGNOSIS — I2510 Atherosclerotic heart disease of native coronary artery without angina pectoris: Secondary | ICD-10-CM | POA: Diagnosis not present

## 2019-07-20 DIAGNOSIS — E1140 Type 2 diabetes mellitus with diabetic neuropathy, unspecified: Secondary | ICD-10-CM | POA: Diagnosis not present

## 2019-07-20 DIAGNOSIS — I69391 Dysphagia following cerebral infarction: Secondary | ICD-10-CM | POA: Diagnosis not present

## 2019-07-20 DIAGNOSIS — J449 Chronic obstructive pulmonary disease, unspecified: Secondary | ICD-10-CM | POA: Diagnosis not present

## 2019-07-20 DIAGNOSIS — R1312 Dysphagia, oropharyngeal phase: Secondary | ICD-10-CM | POA: Diagnosis not present

## 2019-07-20 DIAGNOSIS — I69322 Dysarthria following cerebral infarction: Secondary | ICD-10-CM | POA: Diagnosis not present

## 2019-07-20 DIAGNOSIS — I69351 Hemiplegia and hemiparesis following cerebral infarction affecting right dominant side: Secondary | ICD-10-CM | POA: Diagnosis not present

## 2019-07-20 DIAGNOSIS — I69320 Aphasia following cerebral infarction: Secondary | ICD-10-CM | POA: Diagnosis not present

## 2019-07-20 DIAGNOSIS — I5022 Chronic systolic (congestive) heart failure: Secondary | ICD-10-CM | POA: Diagnosis not present

## 2019-07-27 DIAGNOSIS — Z20828 Contact with and (suspected) exposure to other viral communicable diseases: Secondary | ICD-10-CM | POA: Diagnosis not present

## 2019-07-29 DIAGNOSIS — I69391 Dysphagia following cerebral infarction: Secondary | ICD-10-CM | POA: Diagnosis not present

## 2019-07-29 DIAGNOSIS — I5022 Chronic systolic (congestive) heart failure: Secondary | ICD-10-CM | POA: Diagnosis not present

## 2019-07-29 DIAGNOSIS — I2510 Atherosclerotic heart disease of native coronary artery without angina pectoris: Secondary | ICD-10-CM | POA: Diagnosis not present

## 2019-07-29 DIAGNOSIS — E1140 Type 2 diabetes mellitus with diabetic neuropathy, unspecified: Secondary | ICD-10-CM | POA: Diagnosis not present

## 2019-07-29 DIAGNOSIS — I69320 Aphasia following cerebral infarction: Secondary | ICD-10-CM | POA: Diagnosis not present

## 2019-07-29 DIAGNOSIS — I69322 Dysarthria following cerebral infarction: Secondary | ICD-10-CM | POA: Diagnosis not present

## 2019-07-29 DIAGNOSIS — J449 Chronic obstructive pulmonary disease, unspecified: Secondary | ICD-10-CM | POA: Diagnosis not present

## 2019-07-29 DIAGNOSIS — R1312 Dysphagia, oropharyngeal phase: Secondary | ICD-10-CM | POA: Diagnosis not present

## 2019-07-29 DIAGNOSIS — I69351 Hemiplegia and hemiparesis following cerebral infarction affecting right dominant side: Secondary | ICD-10-CM | POA: Diagnosis not present

## 2019-07-30 DIAGNOSIS — F329 Major depressive disorder, single episode, unspecified: Secondary | ICD-10-CM | POA: Diagnosis not present

## 2019-08-03 DIAGNOSIS — Z20828 Contact with and (suspected) exposure to other viral communicable diseases: Secondary | ICD-10-CM | POA: Diagnosis not present

## 2019-08-06 DIAGNOSIS — I519 Heart disease, unspecified: Secondary | ICD-10-CM | POA: Diagnosis not present

## 2019-08-06 DIAGNOSIS — G4700 Insomnia, unspecified: Secondary | ICD-10-CM | POA: Diagnosis not present

## 2019-08-06 DIAGNOSIS — I119 Hypertensive heart disease without heart failure: Secondary | ICD-10-CM | POA: Diagnosis not present

## 2019-08-06 DIAGNOSIS — R269 Unspecified abnormalities of gait and mobility: Secondary | ICD-10-CM | POA: Diagnosis not present

## 2019-08-06 DIAGNOSIS — Z79899 Other long term (current) drug therapy: Secondary | ICD-10-CM | POA: Diagnosis not present

## 2019-08-06 DIAGNOSIS — I10 Essential (primary) hypertension: Secondary | ICD-10-CM | POA: Diagnosis not present

## 2019-08-09 DIAGNOSIS — Z20828 Contact with and (suspected) exposure to other viral communicable diseases: Secondary | ICD-10-CM | POA: Diagnosis not present

## 2019-08-12 DIAGNOSIS — F3341 Major depressive disorder, recurrent, in partial remission: Secondary | ICD-10-CM | POA: Diagnosis not present

## 2019-08-13 DIAGNOSIS — F329 Major depressive disorder, single episode, unspecified: Secondary | ICD-10-CM | POA: Diagnosis not present

## 2019-08-16 DIAGNOSIS — Z20828 Contact with and (suspected) exposure to other viral communicable diseases: Secondary | ICD-10-CM | POA: Diagnosis not present

## 2019-08-17 DIAGNOSIS — I519 Heart disease, unspecified: Secondary | ICD-10-CM | POA: Diagnosis not present

## 2019-08-17 DIAGNOSIS — Z993 Dependence on wheelchair: Secondary | ICD-10-CM | POA: Diagnosis not present

## 2019-08-17 DIAGNOSIS — Z8673 Personal history of transient ischemic attack (TIA), and cerebral infarction without residual deficits: Secondary | ICD-10-CM | POA: Diagnosis not present

## 2019-08-17 DIAGNOSIS — I2510 Atherosclerotic heart disease of native coronary artery without angina pectoris: Secondary | ICD-10-CM | POA: Diagnosis not present

## 2019-08-17 DIAGNOSIS — R042 Hemoptysis: Secondary | ICD-10-CM | POA: Diagnosis not present

## 2019-08-17 DIAGNOSIS — G4700 Insomnia, unspecified: Secondary | ICD-10-CM | POA: Diagnosis not present

## 2019-08-17 DIAGNOSIS — R269 Unspecified abnormalities of gait and mobility: Secondary | ICD-10-CM | POA: Diagnosis not present

## 2019-08-17 DIAGNOSIS — I119 Hypertensive heart disease without heart failure: Secondary | ICD-10-CM | POA: Diagnosis not present

## 2019-08-18 DIAGNOSIS — R042 Hemoptysis: Secondary | ICD-10-CM | POA: Diagnosis not present

## 2019-08-20 DIAGNOSIS — F329 Major depressive disorder, single episode, unspecified: Secondary | ICD-10-CM | POA: Diagnosis not present

## 2019-08-23 DIAGNOSIS — Z20828 Contact with and (suspected) exposure to other viral communicable diseases: Secondary | ICD-10-CM | POA: Diagnosis not present

## 2019-08-27 DIAGNOSIS — F329 Major depressive disorder, single episode, unspecified: Secondary | ICD-10-CM | POA: Diagnosis not present

## 2019-08-29 DIAGNOSIS — I69351 Hemiplegia and hemiparesis following cerebral infarction affecting right dominant side: Secondary | ICD-10-CM | POA: Diagnosis not present

## 2019-08-29 DIAGNOSIS — I2510 Atherosclerotic heart disease of native coronary artery without angina pectoris: Secondary | ICD-10-CM | POA: Diagnosis not present

## 2019-08-29 DIAGNOSIS — I5022 Chronic systolic (congestive) heart failure: Secondary | ICD-10-CM | POA: Diagnosis not present

## 2019-08-30 DIAGNOSIS — Z20828 Contact with and (suspected) exposure to other viral communicable diseases: Secondary | ICD-10-CM | POA: Diagnosis not present

## 2019-09-03 DIAGNOSIS — I10 Essential (primary) hypertension: Secondary | ICD-10-CM | POA: Diagnosis not present

## 2019-09-03 DIAGNOSIS — R269 Unspecified abnormalities of gait and mobility: Secondary | ICD-10-CM | POA: Diagnosis not present

## 2019-09-03 DIAGNOSIS — I519 Heart disease, unspecified: Secondary | ICD-10-CM | POA: Diagnosis not present

## 2019-09-03 DIAGNOSIS — Z79899 Other long term (current) drug therapy: Secondary | ICD-10-CM | POA: Diagnosis not present

## 2019-09-03 DIAGNOSIS — G4700 Insomnia, unspecified: Secondary | ICD-10-CM | POA: Diagnosis not present

## 2019-09-06 DIAGNOSIS — F329 Major depressive disorder, single episode, unspecified: Secondary | ICD-10-CM | POA: Diagnosis not present

## 2019-09-07 DIAGNOSIS — I679 Cerebrovascular disease, unspecified: Secondary | ICD-10-CM | POA: Diagnosis not present

## 2019-09-07 DIAGNOSIS — R1310 Dysphagia, unspecified: Secondary | ICD-10-CM | POA: Diagnosis not present

## 2019-09-07 DIAGNOSIS — R269 Unspecified abnormalities of gait and mobility: Secondary | ICD-10-CM | POA: Diagnosis not present

## 2019-09-07 DIAGNOSIS — I519 Heart disease, unspecified: Secondary | ICD-10-CM | POA: Diagnosis not present

## 2019-09-07 DIAGNOSIS — Z79899 Other long term (current) drug therapy: Secondary | ICD-10-CM | POA: Diagnosis not present

## 2019-09-07 DIAGNOSIS — I10 Essential (primary) hypertension: Secondary | ICD-10-CM | POA: Diagnosis not present

## 2019-09-07 DIAGNOSIS — H4050X0 Glaucoma secondary to other eye disorders, unspecified eye, stage unspecified: Secondary | ICD-10-CM | POA: Diagnosis not present

## 2019-09-07 DIAGNOSIS — G4700 Insomnia, unspecified: Secondary | ICD-10-CM | POA: Diagnosis not present

## 2019-09-09 DIAGNOSIS — F3341 Major depressive disorder, recurrent, in partial remission: Secondary | ICD-10-CM | POA: Diagnosis not present

## 2019-09-13 DIAGNOSIS — L930 Discoid lupus erythematosus: Secondary | ICD-10-CM | POA: Diagnosis not present

## 2019-09-13 DIAGNOSIS — F329 Major depressive disorder, single episode, unspecified: Secondary | ICD-10-CM | POA: Diagnosis not present

## 2019-09-13 DIAGNOSIS — F339 Major depressive disorder, recurrent, unspecified: Secondary | ICD-10-CM | POA: Diagnosis not present

## 2019-09-13 DIAGNOSIS — I5022 Chronic systolic (congestive) heart failure: Secondary | ICD-10-CM | POA: Diagnosis not present

## 2019-09-13 DIAGNOSIS — I420 Dilated cardiomyopathy: Secondary | ICD-10-CM | POA: Diagnosis not present

## 2019-09-13 DIAGNOSIS — I2510 Atherosclerotic heart disease of native coronary artery without angina pectoris: Secondary | ICD-10-CM | POA: Diagnosis not present

## 2019-09-13 DIAGNOSIS — I69921 Dysphasia following unspecified cerebrovascular disease: Secondary | ICD-10-CM | POA: Diagnosis not present

## 2019-09-13 DIAGNOSIS — I110 Hypertensive heart disease with heart failure: Secondary | ICD-10-CM | POA: Diagnosis not present

## 2019-09-13 DIAGNOSIS — E1140 Type 2 diabetes mellitus with diabetic neuropathy, unspecified: Secondary | ICD-10-CM | POA: Diagnosis not present

## 2019-09-13 DIAGNOSIS — G8190 Hemiplegia, unspecified affecting unspecified side: Secondary | ICD-10-CM | POA: Diagnosis not present

## 2019-09-14 DIAGNOSIS — I110 Hypertensive heart disease with heart failure: Secondary | ICD-10-CM | POA: Diagnosis not present

## 2019-09-14 DIAGNOSIS — F339 Major depressive disorder, recurrent, unspecified: Secondary | ICD-10-CM | POA: Diagnosis not present

## 2019-09-14 DIAGNOSIS — I5022 Chronic systolic (congestive) heart failure: Secondary | ICD-10-CM | POA: Diagnosis not present

## 2019-09-14 DIAGNOSIS — I69921 Dysphasia following unspecified cerebrovascular disease: Secondary | ICD-10-CM | POA: Diagnosis not present

## 2019-09-14 DIAGNOSIS — I420 Dilated cardiomyopathy: Secondary | ICD-10-CM | POA: Diagnosis not present

## 2019-09-14 DIAGNOSIS — L930 Discoid lupus erythematosus: Secondary | ICD-10-CM | POA: Diagnosis not present

## 2019-09-14 DIAGNOSIS — E1140 Type 2 diabetes mellitus with diabetic neuropathy, unspecified: Secondary | ICD-10-CM | POA: Diagnosis not present

## 2019-09-14 DIAGNOSIS — I2510 Atherosclerotic heart disease of native coronary artery without angina pectoris: Secondary | ICD-10-CM | POA: Diagnosis not present

## 2019-09-14 DIAGNOSIS — G8190 Hemiplegia, unspecified affecting unspecified side: Secondary | ICD-10-CM | POA: Diagnosis not present

## 2019-09-21 DIAGNOSIS — I5022 Chronic systolic (congestive) heart failure: Secondary | ICD-10-CM | POA: Diagnosis not present

## 2019-09-21 DIAGNOSIS — G8190 Hemiplegia, unspecified affecting unspecified side: Secondary | ICD-10-CM | POA: Diagnosis not present

## 2019-09-21 DIAGNOSIS — I69921 Dysphasia following unspecified cerebrovascular disease: Secondary | ICD-10-CM | POA: Diagnosis not present

## 2019-09-21 DIAGNOSIS — I110 Hypertensive heart disease with heart failure: Secondary | ICD-10-CM | POA: Diagnosis not present

## 2019-09-21 DIAGNOSIS — F339 Major depressive disorder, recurrent, unspecified: Secondary | ICD-10-CM | POA: Diagnosis not present

## 2019-09-21 DIAGNOSIS — I420 Dilated cardiomyopathy: Secondary | ICD-10-CM | POA: Diagnosis not present

## 2019-09-21 DIAGNOSIS — L930 Discoid lupus erythematosus: Secondary | ICD-10-CM | POA: Diagnosis not present

## 2019-09-21 DIAGNOSIS — I2510 Atherosclerotic heart disease of native coronary artery without angina pectoris: Secondary | ICD-10-CM | POA: Diagnosis not present

## 2019-09-21 DIAGNOSIS — E1140 Type 2 diabetes mellitus with diabetic neuropathy, unspecified: Secondary | ICD-10-CM | POA: Diagnosis not present

## 2019-09-22 DIAGNOSIS — L930 Discoid lupus erythematosus: Secondary | ICD-10-CM | POA: Diagnosis not present

## 2019-09-22 DIAGNOSIS — I2510 Atherosclerotic heart disease of native coronary artery without angina pectoris: Secondary | ICD-10-CM | POA: Diagnosis not present

## 2019-09-22 DIAGNOSIS — I110 Hypertensive heart disease with heart failure: Secondary | ICD-10-CM | POA: Diagnosis not present

## 2019-09-22 DIAGNOSIS — I5022 Chronic systolic (congestive) heart failure: Secondary | ICD-10-CM | POA: Diagnosis not present

## 2019-09-22 DIAGNOSIS — G8190 Hemiplegia, unspecified affecting unspecified side: Secondary | ICD-10-CM | POA: Diagnosis not present

## 2019-09-22 DIAGNOSIS — I69921 Dysphasia following unspecified cerebrovascular disease: Secondary | ICD-10-CM | POA: Diagnosis not present

## 2019-09-22 DIAGNOSIS — I420 Dilated cardiomyopathy: Secondary | ICD-10-CM | POA: Diagnosis not present

## 2019-09-22 DIAGNOSIS — E1140 Type 2 diabetes mellitus with diabetic neuropathy, unspecified: Secondary | ICD-10-CM | POA: Diagnosis not present

## 2019-09-22 DIAGNOSIS — F339 Major depressive disorder, recurrent, unspecified: Secondary | ICD-10-CM | POA: Diagnosis not present

## 2019-09-23 DIAGNOSIS — I5022 Chronic systolic (congestive) heart failure: Secondary | ICD-10-CM | POA: Diagnosis not present

## 2019-09-23 DIAGNOSIS — I10 Essential (primary) hypertension: Secondary | ICD-10-CM | POA: Diagnosis not present

## 2019-09-23 DIAGNOSIS — G629 Polyneuropathy, unspecified: Secondary | ICD-10-CM | POA: Diagnosis not present

## 2019-09-23 DIAGNOSIS — R269 Unspecified abnormalities of gait and mobility: Secondary | ICD-10-CM | POA: Diagnosis not present

## 2019-09-23 DIAGNOSIS — R260 Ataxic gait: Secondary | ICD-10-CM | POA: Diagnosis not present

## 2019-09-23 DIAGNOSIS — E785 Hyperlipidemia, unspecified: Secondary | ICD-10-CM | POA: Diagnosis not present

## 2019-09-23 DIAGNOSIS — E0821 Diabetes mellitus due to underlying condition with diabetic nephropathy: Secondary | ICD-10-CM | POA: Diagnosis not present

## 2019-09-24 DIAGNOSIS — I69921 Dysphasia following unspecified cerebrovascular disease: Secondary | ICD-10-CM | POA: Diagnosis not present

## 2019-09-24 DIAGNOSIS — I2510 Atherosclerotic heart disease of native coronary artery without angina pectoris: Secondary | ICD-10-CM | POA: Diagnosis not present

## 2019-09-24 DIAGNOSIS — I110 Hypertensive heart disease with heart failure: Secondary | ICD-10-CM | POA: Diagnosis not present

## 2019-09-24 DIAGNOSIS — I5022 Chronic systolic (congestive) heart failure: Secondary | ICD-10-CM | POA: Diagnosis not present

## 2019-09-24 DIAGNOSIS — F339 Major depressive disorder, recurrent, unspecified: Secondary | ICD-10-CM | POA: Diagnosis not present

## 2019-09-24 DIAGNOSIS — I420 Dilated cardiomyopathy: Secondary | ICD-10-CM | POA: Diagnosis not present

## 2019-09-24 DIAGNOSIS — L930 Discoid lupus erythematosus: Secondary | ICD-10-CM | POA: Diagnosis not present

## 2019-09-24 DIAGNOSIS — G8190 Hemiplegia, unspecified affecting unspecified side: Secondary | ICD-10-CM | POA: Diagnosis not present

## 2019-09-24 DIAGNOSIS — F329 Major depressive disorder, single episode, unspecified: Secondary | ICD-10-CM | POA: Diagnosis not present

## 2019-09-24 DIAGNOSIS — E1140 Type 2 diabetes mellitus with diabetic neuropathy, unspecified: Secondary | ICD-10-CM | POA: Diagnosis not present

## 2019-09-28 DIAGNOSIS — I2510 Atherosclerotic heart disease of native coronary artery without angina pectoris: Secondary | ICD-10-CM | POA: Diagnosis not present

## 2019-09-28 DIAGNOSIS — I69921 Dysphasia following unspecified cerebrovascular disease: Secondary | ICD-10-CM | POA: Diagnosis not present

## 2019-09-28 DIAGNOSIS — L930 Discoid lupus erythematosus: Secondary | ICD-10-CM | POA: Diagnosis not present

## 2019-09-28 DIAGNOSIS — G8190 Hemiplegia, unspecified affecting unspecified side: Secondary | ICD-10-CM | POA: Diagnosis not present

## 2019-09-28 DIAGNOSIS — E1140 Type 2 diabetes mellitus with diabetic neuropathy, unspecified: Secondary | ICD-10-CM | POA: Diagnosis not present

## 2019-09-28 DIAGNOSIS — I110 Hypertensive heart disease with heart failure: Secondary | ICD-10-CM | POA: Diagnosis not present

## 2019-09-28 DIAGNOSIS — I420 Dilated cardiomyopathy: Secondary | ICD-10-CM | POA: Diagnosis not present

## 2019-09-28 DIAGNOSIS — I5022 Chronic systolic (congestive) heart failure: Secondary | ICD-10-CM | POA: Diagnosis not present

## 2019-09-28 DIAGNOSIS — F339 Major depressive disorder, recurrent, unspecified: Secondary | ICD-10-CM | POA: Diagnosis not present

## 2019-09-29 DIAGNOSIS — I2510 Atherosclerotic heart disease of native coronary artery without angina pectoris: Secondary | ICD-10-CM | POA: Diagnosis not present

## 2019-09-29 DIAGNOSIS — I69921 Dysphasia following unspecified cerebrovascular disease: Secondary | ICD-10-CM | POA: Diagnosis not present

## 2019-09-29 DIAGNOSIS — F339 Major depressive disorder, recurrent, unspecified: Secondary | ICD-10-CM | POA: Diagnosis not present

## 2019-09-29 DIAGNOSIS — G8190 Hemiplegia, unspecified affecting unspecified side: Secondary | ICD-10-CM | POA: Diagnosis not present

## 2019-09-29 DIAGNOSIS — E1140 Type 2 diabetes mellitus with diabetic neuropathy, unspecified: Secondary | ICD-10-CM | POA: Diagnosis not present

## 2019-09-29 DIAGNOSIS — I5022 Chronic systolic (congestive) heart failure: Secondary | ICD-10-CM | POA: Diagnosis not present

## 2019-09-29 DIAGNOSIS — L930 Discoid lupus erythematosus: Secondary | ICD-10-CM | POA: Diagnosis not present

## 2019-09-29 DIAGNOSIS — I110 Hypertensive heart disease with heart failure: Secondary | ICD-10-CM | POA: Diagnosis not present

## 2019-09-29 DIAGNOSIS — I420 Dilated cardiomyopathy: Secondary | ICD-10-CM | POA: Diagnosis not present

## 2019-09-30 DIAGNOSIS — I5022 Chronic systolic (congestive) heart failure: Secondary | ICD-10-CM | POA: Diagnosis not present

## 2019-09-30 DIAGNOSIS — L930 Discoid lupus erythematosus: Secondary | ICD-10-CM | POA: Diagnosis not present

## 2019-09-30 DIAGNOSIS — G8190 Hemiplegia, unspecified affecting unspecified side: Secondary | ICD-10-CM | POA: Diagnosis not present

## 2019-09-30 DIAGNOSIS — F339 Major depressive disorder, recurrent, unspecified: Secondary | ICD-10-CM | POA: Diagnosis not present

## 2019-09-30 DIAGNOSIS — I2510 Atherosclerotic heart disease of native coronary artery without angina pectoris: Secondary | ICD-10-CM | POA: Diagnosis not present

## 2019-09-30 DIAGNOSIS — I420 Dilated cardiomyopathy: Secondary | ICD-10-CM | POA: Diagnosis not present

## 2019-09-30 DIAGNOSIS — I110 Hypertensive heart disease with heart failure: Secondary | ICD-10-CM | POA: Diagnosis not present

## 2019-09-30 DIAGNOSIS — I69921 Dysphasia following unspecified cerebrovascular disease: Secondary | ICD-10-CM | POA: Diagnosis not present

## 2019-09-30 DIAGNOSIS — E1140 Type 2 diabetes mellitus with diabetic neuropathy, unspecified: Secondary | ICD-10-CM | POA: Diagnosis not present

## 2019-10-01 DIAGNOSIS — I10 Essential (primary) hypertension: Secondary | ICD-10-CM | POA: Diagnosis not present

## 2019-10-01 DIAGNOSIS — I519 Heart disease, unspecified: Secondary | ICD-10-CM | POA: Diagnosis not present

## 2019-10-01 DIAGNOSIS — F329 Major depressive disorder, single episode, unspecified: Secondary | ICD-10-CM | POA: Diagnosis not present

## 2019-10-01 DIAGNOSIS — Z79899 Other long term (current) drug therapy: Secondary | ICD-10-CM | POA: Diagnosis not present

## 2019-10-01 DIAGNOSIS — H4050X0 Glaucoma secondary to other eye disorders, unspecified eye, stage unspecified: Secondary | ICD-10-CM | POA: Diagnosis not present

## 2019-10-01 DIAGNOSIS — R1310 Dysphagia, unspecified: Secondary | ICD-10-CM | POA: Diagnosis not present

## 2019-10-04 DIAGNOSIS — E1140 Type 2 diabetes mellitus with diabetic neuropathy, unspecified: Secondary | ICD-10-CM | POA: Diagnosis not present

## 2019-10-04 DIAGNOSIS — G8190 Hemiplegia, unspecified affecting unspecified side: Secondary | ICD-10-CM | POA: Diagnosis not present

## 2019-10-04 DIAGNOSIS — I110 Hypertensive heart disease with heart failure: Secondary | ICD-10-CM | POA: Diagnosis not present

## 2019-10-04 DIAGNOSIS — I69921 Dysphasia following unspecified cerebrovascular disease: Secondary | ICD-10-CM | POA: Diagnosis not present

## 2019-10-04 DIAGNOSIS — F339 Major depressive disorder, recurrent, unspecified: Secondary | ICD-10-CM | POA: Diagnosis not present

## 2019-10-04 DIAGNOSIS — I5022 Chronic systolic (congestive) heart failure: Secondary | ICD-10-CM | POA: Diagnosis not present

## 2019-10-04 DIAGNOSIS — I2510 Atherosclerotic heart disease of native coronary artery without angina pectoris: Secondary | ICD-10-CM | POA: Diagnosis not present

## 2019-10-04 DIAGNOSIS — I420 Dilated cardiomyopathy: Secondary | ICD-10-CM | POA: Diagnosis not present

## 2019-10-04 DIAGNOSIS — L930 Discoid lupus erythematosus: Secondary | ICD-10-CM | POA: Diagnosis not present

## 2019-10-06 DIAGNOSIS — Z20828 Contact with and (suspected) exposure to other viral communicable diseases: Secondary | ICD-10-CM | POA: Diagnosis not present

## 2019-10-07 DIAGNOSIS — G8190 Hemiplegia, unspecified affecting unspecified side: Secondary | ICD-10-CM | POA: Diagnosis not present

## 2019-10-07 DIAGNOSIS — F339 Major depressive disorder, recurrent, unspecified: Secondary | ICD-10-CM | POA: Diagnosis not present

## 2019-10-07 DIAGNOSIS — I5022 Chronic systolic (congestive) heart failure: Secondary | ICD-10-CM | POA: Diagnosis not present

## 2019-10-07 DIAGNOSIS — I2510 Atherosclerotic heart disease of native coronary artery without angina pectoris: Secondary | ICD-10-CM | POA: Diagnosis not present

## 2019-10-07 DIAGNOSIS — I420 Dilated cardiomyopathy: Secondary | ICD-10-CM | POA: Diagnosis not present

## 2019-10-07 DIAGNOSIS — I110 Hypertensive heart disease with heart failure: Secondary | ICD-10-CM | POA: Diagnosis not present

## 2019-10-07 DIAGNOSIS — L930 Discoid lupus erythematosus: Secondary | ICD-10-CM | POA: Diagnosis not present

## 2019-10-07 DIAGNOSIS — E1140 Type 2 diabetes mellitus with diabetic neuropathy, unspecified: Secondary | ICD-10-CM | POA: Diagnosis not present

## 2019-10-07 DIAGNOSIS — I69921 Dysphasia following unspecified cerebrovascular disease: Secondary | ICD-10-CM | POA: Diagnosis not present

## 2019-10-08 DIAGNOSIS — F329 Major depressive disorder, single episode, unspecified: Secondary | ICD-10-CM | POA: Diagnosis not present

## 2019-10-11 DIAGNOSIS — E1140 Type 2 diabetes mellitus with diabetic neuropathy, unspecified: Secondary | ICD-10-CM | POA: Diagnosis not present

## 2019-10-11 DIAGNOSIS — L930 Discoid lupus erythematosus: Secondary | ICD-10-CM | POA: Diagnosis not present

## 2019-10-11 DIAGNOSIS — F329 Major depressive disorder, single episode, unspecified: Secondary | ICD-10-CM | POA: Diagnosis not present

## 2019-10-11 DIAGNOSIS — F339 Major depressive disorder, recurrent, unspecified: Secondary | ICD-10-CM | POA: Diagnosis not present

## 2019-10-11 DIAGNOSIS — I69921 Dysphasia following unspecified cerebrovascular disease: Secondary | ICD-10-CM | POA: Diagnosis not present

## 2019-10-11 DIAGNOSIS — I5022 Chronic systolic (congestive) heart failure: Secondary | ICD-10-CM | POA: Diagnosis not present

## 2019-10-11 DIAGNOSIS — I2510 Atherosclerotic heart disease of native coronary artery without angina pectoris: Secondary | ICD-10-CM | POA: Diagnosis not present

## 2019-10-11 DIAGNOSIS — I420 Dilated cardiomyopathy: Secondary | ICD-10-CM | POA: Diagnosis not present

## 2019-10-11 DIAGNOSIS — I110 Hypertensive heart disease with heart failure: Secondary | ICD-10-CM | POA: Diagnosis not present

## 2019-10-11 DIAGNOSIS — G8190 Hemiplegia, unspecified affecting unspecified side: Secondary | ICD-10-CM | POA: Diagnosis not present

## 2019-10-12 DIAGNOSIS — Z20828 Contact with and (suspected) exposure to other viral communicable diseases: Secondary | ICD-10-CM | POA: Diagnosis not present

## 2019-10-19 DIAGNOSIS — I2510 Atherosclerotic heart disease of native coronary artery without angina pectoris: Secondary | ICD-10-CM | POA: Diagnosis not present

## 2019-10-19 DIAGNOSIS — E1140 Type 2 diabetes mellitus with diabetic neuropathy, unspecified: Secondary | ICD-10-CM | POA: Diagnosis not present

## 2019-10-19 DIAGNOSIS — Z20828 Contact with and (suspected) exposure to other viral communicable diseases: Secondary | ICD-10-CM | POA: Diagnosis not present

## 2019-10-19 DIAGNOSIS — I5022 Chronic systolic (congestive) heart failure: Secondary | ICD-10-CM | POA: Diagnosis not present

## 2019-10-19 DIAGNOSIS — I420 Dilated cardiomyopathy: Secondary | ICD-10-CM | POA: Diagnosis not present

## 2019-10-19 DIAGNOSIS — I69921 Dysphasia following unspecified cerebrovascular disease: Secondary | ICD-10-CM | POA: Diagnosis not present

## 2019-10-19 DIAGNOSIS — F339 Major depressive disorder, recurrent, unspecified: Secondary | ICD-10-CM | POA: Diagnosis not present

## 2019-10-19 DIAGNOSIS — I10 Essential (primary) hypertension: Secondary | ICD-10-CM | POA: Diagnosis not present

## 2019-10-19 DIAGNOSIS — G8190 Hemiplegia, unspecified affecting unspecified side: Secondary | ICD-10-CM | POA: Diagnosis not present

## 2019-10-19 DIAGNOSIS — L930 Discoid lupus erythematosus: Secondary | ICD-10-CM | POA: Diagnosis not present

## 2019-10-19 DIAGNOSIS — I110 Hypertensive heart disease with heart failure: Secondary | ICD-10-CM | POA: Diagnosis not present

## 2019-10-21 DIAGNOSIS — L930 Discoid lupus erythematosus: Secondary | ICD-10-CM | POA: Diagnosis not present

## 2019-10-21 DIAGNOSIS — G8190 Hemiplegia, unspecified affecting unspecified side: Secondary | ICD-10-CM | POA: Diagnosis not present

## 2019-10-21 DIAGNOSIS — E1140 Type 2 diabetes mellitus with diabetic neuropathy, unspecified: Secondary | ICD-10-CM | POA: Diagnosis not present

## 2019-10-21 DIAGNOSIS — F339 Major depressive disorder, recurrent, unspecified: Secondary | ICD-10-CM | POA: Diagnosis not present

## 2019-10-21 DIAGNOSIS — I420 Dilated cardiomyopathy: Secondary | ICD-10-CM | POA: Diagnosis not present

## 2019-10-21 DIAGNOSIS — I110 Hypertensive heart disease with heart failure: Secondary | ICD-10-CM | POA: Diagnosis not present

## 2019-10-21 DIAGNOSIS — I5022 Chronic systolic (congestive) heart failure: Secondary | ICD-10-CM | POA: Diagnosis not present

## 2019-10-21 DIAGNOSIS — I69921 Dysphasia following unspecified cerebrovascular disease: Secondary | ICD-10-CM | POA: Diagnosis not present

## 2019-10-21 DIAGNOSIS — I2510 Atherosclerotic heart disease of native coronary artery without angina pectoris: Secondary | ICD-10-CM | POA: Diagnosis not present

## 2019-10-22 DIAGNOSIS — F329 Major depressive disorder, single episode, unspecified: Secondary | ICD-10-CM | POA: Diagnosis not present

## 2019-10-25 DIAGNOSIS — Z20828 Contact with and (suspected) exposure to other viral communicable diseases: Secondary | ICD-10-CM | POA: Diagnosis not present

## 2019-10-25 DIAGNOSIS — I69921 Dysphasia following unspecified cerebrovascular disease: Secondary | ICD-10-CM | POA: Diagnosis not present

## 2019-10-25 DIAGNOSIS — I5022 Chronic systolic (congestive) heart failure: Secondary | ICD-10-CM | POA: Diagnosis not present

## 2019-10-25 DIAGNOSIS — F339 Major depressive disorder, recurrent, unspecified: Secondary | ICD-10-CM | POA: Diagnosis not present

## 2019-10-25 DIAGNOSIS — I10 Essential (primary) hypertension: Secondary | ICD-10-CM | POA: Diagnosis not present

## 2019-10-25 DIAGNOSIS — G8190 Hemiplegia, unspecified affecting unspecified side: Secondary | ICD-10-CM | POA: Diagnosis not present

## 2019-10-25 DIAGNOSIS — E1140 Type 2 diabetes mellitus with diabetic neuropathy, unspecified: Secondary | ICD-10-CM | POA: Diagnosis not present

## 2019-10-25 DIAGNOSIS — I2510 Atherosclerotic heart disease of native coronary artery without angina pectoris: Secondary | ICD-10-CM | POA: Diagnosis not present

## 2019-10-25 DIAGNOSIS — I420 Dilated cardiomyopathy: Secondary | ICD-10-CM | POA: Diagnosis not present

## 2019-10-25 DIAGNOSIS — L930 Discoid lupus erythematosus: Secondary | ICD-10-CM | POA: Diagnosis not present

## 2019-10-27 DIAGNOSIS — L930 Discoid lupus erythematosus: Secondary | ICD-10-CM | POA: Diagnosis not present

## 2019-10-27 DIAGNOSIS — I10 Essential (primary) hypertension: Secondary | ICD-10-CM | POA: Diagnosis not present

## 2019-10-27 DIAGNOSIS — I5022 Chronic systolic (congestive) heart failure: Secondary | ICD-10-CM | POA: Diagnosis not present

## 2019-10-27 DIAGNOSIS — I2510 Atherosclerotic heart disease of native coronary artery without angina pectoris: Secondary | ICD-10-CM | POA: Diagnosis not present

## 2019-10-27 DIAGNOSIS — I69921 Dysphasia following unspecified cerebrovascular disease: Secondary | ICD-10-CM | POA: Diagnosis not present

## 2019-10-27 DIAGNOSIS — E1140 Type 2 diabetes mellitus with diabetic neuropathy, unspecified: Secondary | ICD-10-CM | POA: Diagnosis not present

## 2019-10-27 DIAGNOSIS — F339 Major depressive disorder, recurrent, unspecified: Secondary | ICD-10-CM | POA: Diagnosis not present

## 2019-10-27 DIAGNOSIS — I420 Dilated cardiomyopathy: Secondary | ICD-10-CM | POA: Diagnosis not present

## 2019-10-27 DIAGNOSIS — G8190 Hemiplegia, unspecified affecting unspecified side: Secondary | ICD-10-CM | POA: Diagnosis not present

## 2019-10-28 DIAGNOSIS — F3341 Major depressive disorder, recurrent, in partial remission: Secondary | ICD-10-CM | POA: Diagnosis not present

## 2019-11-01 DIAGNOSIS — Z20828 Contact with and (suspected) exposure to other viral communicable diseases: Secondary | ICD-10-CM | POA: Diagnosis not present

## 2019-11-03 DIAGNOSIS — I5022 Chronic systolic (congestive) heart failure: Secondary | ICD-10-CM | POA: Diagnosis not present

## 2019-11-03 DIAGNOSIS — I2510 Atherosclerotic heart disease of native coronary artery without angina pectoris: Secondary | ICD-10-CM | POA: Diagnosis not present

## 2019-11-03 DIAGNOSIS — I10 Essential (primary) hypertension: Secondary | ICD-10-CM | POA: Diagnosis not present

## 2019-11-03 DIAGNOSIS — I69921 Dysphasia following unspecified cerebrovascular disease: Secondary | ICD-10-CM | POA: Diagnosis not present

## 2019-11-03 DIAGNOSIS — F339 Major depressive disorder, recurrent, unspecified: Secondary | ICD-10-CM | POA: Diagnosis not present

## 2019-11-03 DIAGNOSIS — G8190 Hemiplegia, unspecified affecting unspecified side: Secondary | ICD-10-CM | POA: Diagnosis not present

## 2019-11-03 DIAGNOSIS — L930 Discoid lupus erythematosus: Secondary | ICD-10-CM | POA: Diagnosis not present

## 2019-11-03 DIAGNOSIS — I420 Dilated cardiomyopathy: Secondary | ICD-10-CM | POA: Diagnosis not present

## 2019-11-03 DIAGNOSIS — E1140 Type 2 diabetes mellitus with diabetic neuropathy, unspecified: Secondary | ICD-10-CM | POA: Diagnosis not present

## 2019-11-05 DIAGNOSIS — F329 Major depressive disorder, single episode, unspecified: Secondary | ICD-10-CM | POA: Diagnosis not present

## 2019-11-05 DIAGNOSIS — I69391 Dysphagia following cerebral infarction: Secondary | ICD-10-CM | POA: Diagnosis not present

## 2019-11-05 DIAGNOSIS — I10 Essential (primary) hypertension: Secondary | ICD-10-CM | POA: Diagnosis not present

## 2019-11-05 DIAGNOSIS — H4050X0 Glaucoma secondary to other eye disorders, unspecified eye, stage unspecified: Secondary | ICD-10-CM | POA: Diagnosis not present

## 2019-11-08 DIAGNOSIS — Z20828 Contact with and (suspected) exposure to other viral communicable diseases: Secondary | ICD-10-CM | POA: Diagnosis not present

## 2019-11-09 DIAGNOSIS — E1140 Type 2 diabetes mellitus with diabetic neuropathy, unspecified: Secondary | ICD-10-CM | POA: Diagnosis not present

## 2019-11-09 DIAGNOSIS — I5022 Chronic systolic (congestive) heart failure: Secondary | ICD-10-CM | POA: Diagnosis not present

## 2019-11-09 DIAGNOSIS — F339 Major depressive disorder, recurrent, unspecified: Secondary | ICD-10-CM | POA: Diagnosis not present

## 2019-11-09 DIAGNOSIS — I2510 Atherosclerotic heart disease of native coronary artery without angina pectoris: Secondary | ICD-10-CM | POA: Diagnosis not present

## 2019-11-09 DIAGNOSIS — G8190 Hemiplegia, unspecified affecting unspecified side: Secondary | ICD-10-CM | POA: Diagnosis not present

## 2019-11-09 DIAGNOSIS — L930 Discoid lupus erythematosus: Secondary | ICD-10-CM | POA: Diagnosis not present

## 2019-11-09 DIAGNOSIS — I69921 Dysphasia following unspecified cerebrovascular disease: Secondary | ICD-10-CM | POA: Diagnosis not present

## 2019-11-09 DIAGNOSIS — I10 Essential (primary) hypertension: Secondary | ICD-10-CM | POA: Diagnosis not present

## 2019-11-09 DIAGNOSIS — I420 Dilated cardiomyopathy: Secondary | ICD-10-CM | POA: Diagnosis not present

## 2019-11-10 DIAGNOSIS — I69921 Dysphasia following unspecified cerebrovascular disease: Secondary | ICD-10-CM | POA: Diagnosis not present

## 2019-11-10 DIAGNOSIS — I110 Hypertensive heart disease with heart failure: Secondary | ICD-10-CM | POA: Diagnosis not present

## 2019-11-10 DIAGNOSIS — I5022 Chronic systolic (congestive) heart failure: Secondary | ICD-10-CM | POA: Diagnosis not present

## 2019-11-11 DIAGNOSIS — I420 Dilated cardiomyopathy: Secondary | ICD-10-CM | POA: Diagnosis not present

## 2019-11-11 DIAGNOSIS — G8190 Hemiplegia, unspecified affecting unspecified side: Secondary | ICD-10-CM | POA: Diagnosis not present

## 2019-11-11 DIAGNOSIS — I10 Essential (primary) hypertension: Secondary | ICD-10-CM | POA: Diagnosis not present

## 2019-11-11 DIAGNOSIS — I2510 Atherosclerotic heart disease of native coronary artery without angina pectoris: Secondary | ICD-10-CM | POA: Diagnosis not present

## 2019-11-11 DIAGNOSIS — I5022 Chronic systolic (congestive) heart failure: Secondary | ICD-10-CM | POA: Diagnosis not present

## 2019-11-11 DIAGNOSIS — L930 Discoid lupus erythematosus: Secondary | ICD-10-CM | POA: Diagnosis not present

## 2019-11-11 DIAGNOSIS — E1140 Type 2 diabetes mellitus with diabetic neuropathy, unspecified: Secondary | ICD-10-CM | POA: Diagnosis not present

## 2019-11-11 DIAGNOSIS — F339 Major depressive disorder, recurrent, unspecified: Secondary | ICD-10-CM | POA: Diagnosis not present

## 2019-11-11 DIAGNOSIS — I69921 Dysphasia following unspecified cerebrovascular disease: Secondary | ICD-10-CM | POA: Diagnosis not present

## 2019-11-12 DIAGNOSIS — I2510 Atherosclerotic heart disease of native coronary artery without angina pectoris: Secondary | ICD-10-CM | POA: Diagnosis not present

## 2019-11-12 DIAGNOSIS — I110 Hypertensive heart disease with heart failure: Secondary | ICD-10-CM | POA: Diagnosis not present

## 2019-11-12 DIAGNOSIS — Z993 Dependence on wheelchair: Secondary | ICD-10-CM | POA: Diagnosis not present

## 2019-11-12 DIAGNOSIS — E1140 Type 2 diabetes mellitus with diabetic neuropathy, unspecified: Secondary | ICD-10-CM | POA: Diagnosis not present

## 2019-11-12 DIAGNOSIS — E782 Mixed hyperlipidemia: Secondary | ICD-10-CM | POA: Diagnosis not present

## 2019-11-12 DIAGNOSIS — G8190 Hemiplegia, unspecified affecting unspecified side: Secondary | ICD-10-CM | POA: Diagnosis not present

## 2019-11-12 DIAGNOSIS — I10 Essential (primary) hypertension: Secondary | ICD-10-CM | POA: Diagnosis not present

## 2019-11-12 DIAGNOSIS — F339 Major depressive disorder, recurrent, unspecified: Secondary | ICD-10-CM | POA: Diagnosis not present

## 2019-11-12 DIAGNOSIS — I69921 Dysphasia following unspecified cerebrovascular disease: Secondary | ICD-10-CM | POA: Diagnosis not present

## 2019-11-12 DIAGNOSIS — L930 Discoid lupus erythematosus: Secondary | ICD-10-CM | POA: Diagnosis not present

## 2019-11-12 DIAGNOSIS — R269 Unspecified abnormalities of gait and mobility: Secondary | ICD-10-CM | POA: Diagnosis not present

## 2019-11-12 DIAGNOSIS — Z0000 Encounter for general adult medical examination without abnormal findings: Secondary | ICD-10-CM | POA: Diagnosis not present

## 2019-11-12 DIAGNOSIS — F329 Major depressive disorder, single episode, unspecified: Secondary | ICD-10-CM | POA: Diagnosis not present

## 2019-11-12 DIAGNOSIS — I420 Dilated cardiomyopathy: Secondary | ICD-10-CM | POA: Diagnosis not present

## 2019-11-12 DIAGNOSIS — I5022 Chronic systolic (congestive) heart failure: Secondary | ICD-10-CM | POA: Diagnosis not present

## 2019-11-15 DIAGNOSIS — Z20828 Contact with and (suspected) exposure to other viral communicable diseases: Secondary | ICD-10-CM | POA: Diagnosis not present

## 2019-11-16 DIAGNOSIS — I2510 Atherosclerotic heart disease of native coronary artery without angina pectoris: Secondary | ICD-10-CM | POA: Diagnosis not present

## 2019-11-16 DIAGNOSIS — G8190 Hemiplegia, unspecified affecting unspecified side: Secondary | ICD-10-CM | POA: Diagnosis not present

## 2019-11-16 DIAGNOSIS — E1140 Type 2 diabetes mellitus with diabetic neuropathy, unspecified: Secondary | ICD-10-CM | POA: Diagnosis not present

## 2019-11-16 DIAGNOSIS — I5022 Chronic systolic (congestive) heart failure: Secondary | ICD-10-CM | POA: Diagnosis not present

## 2019-11-16 DIAGNOSIS — L930 Discoid lupus erythematosus: Secondary | ICD-10-CM | POA: Diagnosis not present

## 2019-11-16 DIAGNOSIS — I69921 Dysphasia following unspecified cerebrovascular disease: Secondary | ICD-10-CM | POA: Diagnosis not present

## 2019-11-16 DIAGNOSIS — I420 Dilated cardiomyopathy: Secondary | ICD-10-CM | POA: Diagnosis not present

## 2019-11-16 DIAGNOSIS — I110 Hypertensive heart disease with heart failure: Secondary | ICD-10-CM | POA: Diagnosis not present

## 2019-11-16 DIAGNOSIS — F339 Major depressive disorder, recurrent, unspecified: Secondary | ICD-10-CM | POA: Diagnosis not present

## 2019-11-18 DIAGNOSIS — L930 Discoid lupus erythematosus: Secondary | ICD-10-CM | POA: Diagnosis not present

## 2019-11-18 DIAGNOSIS — I5022 Chronic systolic (congestive) heart failure: Secondary | ICD-10-CM | POA: Diagnosis not present

## 2019-11-18 DIAGNOSIS — I2510 Atherosclerotic heart disease of native coronary artery without angina pectoris: Secondary | ICD-10-CM | POA: Diagnosis not present

## 2019-11-18 DIAGNOSIS — I420 Dilated cardiomyopathy: Secondary | ICD-10-CM | POA: Diagnosis not present

## 2019-11-18 DIAGNOSIS — I110 Hypertensive heart disease with heart failure: Secondary | ICD-10-CM | POA: Diagnosis not present

## 2019-11-18 DIAGNOSIS — F339 Major depressive disorder, recurrent, unspecified: Secondary | ICD-10-CM | POA: Diagnosis not present

## 2019-11-18 DIAGNOSIS — I69921 Dysphasia following unspecified cerebrovascular disease: Secondary | ICD-10-CM | POA: Diagnosis not present

## 2019-11-18 DIAGNOSIS — G8190 Hemiplegia, unspecified affecting unspecified side: Secondary | ICD-10-CM | POA: Diagnosis not present

## 2019-11-18 DIAGNOSIS — E1140 Type 2 diabetes mellitus with diabetic neuropathy, unspecified: Secondary | ICD-10-CM | POA: Diagnosis not present

## 2019-11-19 DIAGNOSIS — F329 Major depressive disorder, single episode, unspecified: Secondary | ICD-10-CM | POA: Diagnosis not present
